# Patient Record
Sex: Female | Born: 1955 | Race: White | Hispanic: No | Smoking: Former smoker
Health system: Southern US, Community
[De-identification: ages and names within clinical notes are randomized; demographics above are authoritative.]

## PROBLEM LIST (undated history)

## (undated) DIAGNOSIS — E785 Hyperlipidemia, unspecified: Secondary | ICD-10-CM

## (undated) DIAGNOSIS — M1990 Unspecified osteoarthritis, unspecified site: Secondary | ICD-10-CM

## (undated) DIAGNOSIS — F419 Anxiety disorder, unspecified: Secondary | ICD-10-CM

## (undated) DIAGNOSIS — L659 Nonscarring hair loss, unspecified: Secondary | ICD-10-CM

## (undated) DIAGNOSIS — K52831 Collagenous colitis: Secondary | ICD-10-CM

## (undated) DIAGNOSIS — Z9889 Other specified postprocedural states: Secondary | ICD-10-CM

## (undated) DIAGNOSIS — M549 Dorsalgia, unspecified: Secondary | ICD-10-CM

## (undated) DIAGNOSIS — L719 Rosacea, unspecified: Secondary | ICD-10-CM

## (undated) DIAGNOSIS — E119 Type 2 diabetes mellitus without complications: Secondary | ICD-10-CM

## (undated) DIAGNOSIS — R51 Headache: Secondary | ICD-10-CM

## (undated) DIAGNOSIS — R519 Headache, unspecified: Secondary | ICD-10-CM

## (undated) DIAGNOSIS — L219 Seborrheic dermatitis, unspecified: Secondary | ICD-10-CM

## (undated) DIAGNOSIS — I10 Essential (primary) hypertension: Secondary | ICD-10-CM

## (undated) DIAGNOSIS — T7840XA Allergy, unspecified, initial encounter: Secondary | ICD-10-CM

## (undated) DIAGNOSIS — R928 Other abnormal and inconclusive findings on diagnostic imaging of breast: Secondary | ICD-10-CM

## (undated) DIAGNOSIS — K219 Gastro-esophageal reflux disease without esophagitis: Secondary | ICD-10-CM

## (undated) DIAGNOSIS — F329 Major depressive disorder, single episode, unspecified: Secondary | ICD-10-CM

## (undated) DIAGNOSIS — F32A Depression, unspecified: Secondary | ICD-10-CM

## (undated) DIAGNOSIS — Z973 Presence of spectacles and contact lenses: Secondary | ICD-10-CM

## (undated) DIAGNOSIS — G4730 Sleep apnea, unspecified: Secondary | ICD-10-CM

## (undated) DIAGNOSIS — G589 Mononeuropathy, unspecified: Secondary | ICD-10-CM

## (undated) DIAGNOSIS — C801 Malignant (primary) neoplasm, unspecified: Secondary | ICD-10-CM

## (undated) DIAGNOSIS — C50919 Malignant neoplasm of unspecified site of unspecified female breast: Secondary | ICD-10-CM

## (undated) DIAGNOSIS — T753XXA Motion sickness, initial encounter: Secondary | ICD-10-CM

## (undated) DIAGNOSIS — R112 Nausea with vomiting, unspecified: Secondary | ICD-10-CM

## (undated) DIAGNOSIS — Z17421 Hormone receptor negative with human epidermal growth factor receptor 2 negative status: Secondary | ICD-10-CM

## (undated) HISTORY — DX: Malignant neoplasm of unspecified site of unspecified female breast: C50.919

## (undated) HISTORY — DX: Unspecified osteoarthritis, unspecified site: M19.90

## (undated) HISTORY — DX: Seborrheic dermatitis, unspecified: L21.9

## (undated) HISTORY — PX: COLONOSCOPY: SHX174

## (undated) HISTORY — PX: TONSILLECTOMY: SUR1361

## (undated) HISTORY — DX: Mononeuropathy, unspecified: G58.9

## (undated) HISTORY — DX: Malignant (primary) neoplasm, unspecified: C80.1

## (undated) HISTORY — PX: CARPAL TUNNEL RELEASE: SHX101

## (undated) HISTORY — DX: Other abnormal and inconclusive findings on diagnostic imaging of breast: R92.8

## (undated) HISTORY — DX: Anxiety disorder, unspecified: F41.9

## (undated) HISTORY — DX: Collagenous colitis: K52.831

## (undated) HISTORY — DX: Hormone receptor negative with human epidermal growth factor receptor 2 negative status: Z17.421

## (undated) HISTORY — DX: Rosacea, unspecified: L71.9

## (undated) HISTORY — DX: Allergy, unspecified, initial encounter: T78.40XA

## (undated) HISTORY — PX: OTHER SURGICAL HISTORY: SHX169

## (undated) HISTORY — PX: MASTECTOMY: SHX3

---

## 1987-11-30 HISTORY — PX: CARPAL TUNNEL RELEASE: SHX101

## 1996-11-29 HISTORY — PX: UVULOPALATOPHARYNGOPLASTY: SHX827

## 1998-11-29 HISTORY — PX: TONSILLECTOMY: SUR1361

## 2005-11-29 HISTORY — PX: ACHILLES TENDON REPAIR: SUR1153

## 2008-07-17 ENCOUNTER — Encounter: Payer: Self-pay | Admitting: General Practice

## 2008-07-30 ENCOUNTER — Encounter: Payer: Self-pay | Admitting: General Practice

## 2008-08-29 ENCOUNTER — Encounter: Payer: Self-pay | Admitting: General Practice

## 2008-10-29 ENCOUNTER — Ambulatory Visit: Payer: Self-pay | Admitting: Gastroenterology

## 2008-11-07 ENCOUNTER — Ambulatory Visit: Payer: Self-pay | Admitting: Gastroenterology

## 2008-11-29 LAB — HM COLONOSCOPY

## 2009-02-26 ENCOUNTER — Ambulatory Visit: Payer: Self-pay

## 2009-11-29 HISTORY — PX: BREAST SURGERY: SHX581

## 2011-11-30 HISTORY — PX: FINGER EXPLORATION: SHX1635

## 2013-09-28 ENCOUNTER — Encounter (HOSPITAL_BASED_OUTPATIENT_CLINIC_OR_DEPARTMENT_OTHER): Payer: Self-pay | Admitting: *Deleted

## 2013-09-28 NOTE — Progress Notes (Signed)
Pt lives [place]-works for [facility]-had labs done 08/27/13-will call for results and last ekg-may need ekg-and istat if labs not ok with anesthesia

## 2013-10-01 ENCOUNTER — Other Ambulatory Visit: Payer: Self-pay | Admitting: Orthopedic Surgery

## 2013-10-02 NOTE — H&P (Signed)
Sierra Copeland is an 57 y.o. female.   Chief Complaint: left long finger numbness HPI: as above s/p left long trigger release now with numbness  Past Medical History  Diagnosis Date  . Diabetes mellitus without complication   . Hypertension   . Hyperlipemia   . Wears glasses   . Arthritis   . GERD (gastroesophageal reflux disease)     occ-no meds  . Depression   . PONV (postoperative nausea and vomiting)     Past Surgical History  Procedure Laterality Date  . Uvulopalatopharyngoplasty  1998    and tonsils-tongue pexi  . Breast surgery  2011    lt br bx-non cancer  . Finger exploration  2013    left long   . Carpal tunnel release      right and left  . Achilles tendon repair  2007    right  . Colonoscopy    . Tonsillectomy      History reviewed. No pertinent family history. Social History:  reports that she quit smoking about 27 years ago. She does not have any smokeless tobacco history on file. She reports that she does not drink alcohol or use illicit drugs.  Allergies: No Known Allergies  No prescriptions prior to admission    No results found for this or any previous visit (from the past 48 hour(s)). No results found.  Review of Systems  All other systems reviewed and are negative.    Height 5\' 2"  (1.575 m), weight 115.667 kg (255 lb). Physical Exam  Constitutional: She is oriented to person, place, and time. She appears well-developed and well-nourished.  HENT:  Head: Normocephalic and atraumatic.  Cardiovascular: Normal rate.   Respiratory: Effort normal.  Musculoskeletal:       Left hand: She exhibits tenderness and disruption of two-point discrimination.  S/p trigger release with numbess in left long radial digital nerve  Neurological: She is alert and oriented to person, place, and time.  Skin: Skin is warm.  Psychiatric: She has a normal mood and affect. Her behavior is normal. Judgment and thought content normal.     Assessment/Plan As  above   Plan explore and repair vs reconstruction vs neurolysis  Sierra Copeland A 10/02/2013, 6:43 PM

## 2013-10-03 ENCOUNTER — Ambulatory Visit (HOSPITAL_BASED_OUTPATIENT_CLINIC_OR_DEPARTMENT_OTHER)
Admission: RE | Admit: 2013-10-03 | Discharge: 2013-10-03 | Disposition: A | Discharge status: Home / Self Care | Payer: 59 | Source: Ambulatory Visit | Attending: Orthopedic Surgery | Admitting: Orthopedic Surgery

## 2013-10-03 ENCOUNTER — Encounter (HOSPITAL_BASED_OUTPATIENT_CLINIC_OR_DEPARTMENT_OTHER): Payer: Self-pay | Admitting: *Deleted

## 2013-10-03 ENCOUNTER — Encounter (HOSPITAL_BASED_OUTPATIENT_CLINIC_OR_DEPARTMENT_OTHER)
Admission: RE | Disposition: A | Discharge status: Home / Self Care | Payer: Self-pay | Source: Ambulatory Visit | Attending: Orthopedic Surgery

## 2013-10-03 ENCOUNTER — Encounter (HOSPITAL_BASED_OUTPATIENT_CLINIC_OR_DEPARTMENT_OTHER): Payer: 59 | Admitting: *Deleted

## 2013-10-03 ENCOUNTER — Ambulatory Visit (HOSPITAL_BASED_OUTPATIENT_CLINIC_OR_DEPARTMENT_OTHER): Payer: 59 | Admitting: *Deleted

## 2013-10-03 DIAGNOSIS — R209 Unspecified disturbances of skin sensation: Secondary | ICD-10-CM | POA: Insufficient documentation

## 2013-10-03 DIAGNOSIS — IMO0001 Reserved for inherently not codable concepts without codable children: Secondary | ICD-10-CM

## 2013-10-03 HISTORY — DX: Hyperlipidemia, unspecified: E78.5

## 2013-10-03 HISTORY — DX: Depression, unspecified: F32.A

## 2013-10-03 HISTORY — DX: Gastro-esophageal reflux disease without esophagitis: K21.9

## 2013-10-03 HISTORY — DX: Presence of spectacles and contact lenses: Z97.3

## 2013-10-03 HISTORY — DX: Essential (primary) hypertension: I10

## 2013-10-03 HISTORY — DX: Unspecified osteoarthritis, unspecified site: M19.90

## 2013-10-03 HISTORY — DX: Major depressive disorder, single episode, unspecified: F32.9

## 2013-10-03 HISTORY — DX: Nausea with vomiting, unspecified: R11.2

## 2013-10-03 HISTORY — PX: NERVE EXPLORATION: SHX2082

## 2013-10-03 HISTORY — DX: Other specified postprocedural states: Z98.890

## 2013-10-03 HISTORY — DX: Type 2 diabetes mellitus without complications: E11.9

## 2013-10-03 LAB — POCT I-STAT, CHEM 8
BUN: 19 mg/dL (ref 6–23)
Calcium, Ion: 1.12 mmol/L (ref 1.12–1.23)
Chloride: 101 mEq/L (ref 96–112)
Creatinine, Ser: 1.1 mg/dL (ref 0.50–1.10)
Glucose, Bld: 173 mg/dL — ABNORMAL HIGH (ref 70–99)
HCT: 46 % (ref 36.0–46.0)
Hemoglobin: 15.6 g/dL — ABNORMAL HIGH (ref 12.0–15.0)
Potassium: 4.1 mEq/L (ref 3.5–5.1)
Sodium: 137 mEq/L (ref 135–145)
TCO2: 25 mmol/L (ref 0–100)

## 2013-10-03 LAB — GLUCOSE, CAPILLARY: Glucose-Capillary: 140 mg/dL — ABNORMAL HIGH (ref 70–99)

## 2013-10-03 SURGERY — EXPLORATION, NERVE
Anesthesia: General | Site: Hand | Laterality: Left | Wound class: Clean

## 2013-10-03 MED ORDER — LIDOCAINE HCL (CARDIAC) 20 MG/ML IV SOLN
INTRAVENOUS | Status: DC | PRN
  Administered 2013-10-03: 60 mg via INTRAVENOUS

## 2013-10-03 MED ORDER — HEPARIN SODIUM (PORCINE) 1000 UNIT/ML IJ SOLN
INTRAMUSCULAR | Status: AC
  Filled 2013-10-03: qty 1

## 2013-10-03 MED ORDER — OXYCODONE-ACETAMINOPHEN 5-325 MG PO TABS: 1.0000 | ORAL_TABLET | ORAL | Status: DC | PRN

## 2013-10-03 MED ORDER — FENTANYL CITRATE 0.05 MG/ML IJ SOLN
INTRAMUSCULAR | Status: DC | PRN
  Administered 2013-10-03: 100 ug via INTRAVENOUS
  Administered 2013-10-03: 50 ug via INTRAVENOUS

## 2013-10-03 MED ORDER — LIDOCAINE HCL 2 % IJ SOLN
INTRAMUSCULAR | Status: AC
Start: 2013-10-03 — End: 2013-10-03
  Filled 2013-10-03: qty 20

## 2013-10-03 MED ORDER — PROPOFOL 10 MG/ML IV BOLUS
INTRAVENOUS | Status: DC | PRN
  Administered 2013-10-03: 160 mg via INTRAVENOUS

## 2013-10-03 MED ORDER — ONDANSETRON HCL 4 MG/2ML IJ SOLN
INTRAMUSCULAR | Status: DC | PRN
  Administered 2013-10-03: 4 mg via INTRAVENOUS

## 2013-10-03 MED ORDER — LIDOCAINE HCL (PF) 1 % IJ SOLN
INTRAMUSCULAR | Status: AC
  Filled 2013-10-03: qty 30

## 2013-10-03 MED ORDER — BUPIVACAINE HCL (PF) 0.25 % IJ SOLN
INTRAMUSCULAR | Status: AC
  Filled 2013-10-03: qty 30

## 2013-10-03 MED ORDER — MIDAZOLAM HCL 2 MG/2ML IJ SOLN
INTRAMUSCULAR | Status: AC
  Filled 2013-10-03: qty 2

## 2013-10-03 MED ORDER — CHLORHEXIDINE GLUCONATE 4 % EX LIQD: 60.0000 mL | Freq: Once | CUTANEOUS | Status: DC

## 2013-10-03 MED ORDER — FENTANYL CITRATE 0.05 MG/ML IJ SOLN: 50.0000 ug | INTRAMUSCULAR | Status: DC | PRN

## 2013-10-03 MED ORDER — OXYCODONE HCL 5 MG PO TABS: 5.0000 mg | ORAL_TABLET | Freq: Once | ORAL | Status: DC | PRN

## 2013-10-03 MED ORDER — LACTATED RINGERS IV SOLN
INTRAVENOUS | Status: DC
  Administered 2013-10-03: 08:00:00 via INTRAVENOUS

## 2013-10-03 MED ORDER — MIDAZOLAM HCL 5 MG/5ML IJ SOLN
INTRAMUSCULAR | Status: DC | PRN
  Administered 2013-10-03 (×2): 1 mg via INTRAVENOUS

## 2013-10-03 MED ORDER — BUPIVACAINE HCL 0.25 % IJ SOLN
INTRAMUSCULAR | Status: DC | PRN
  Administered 2013-10-03: 10 mL

## 2013-10-03 MED ORDER — SUCCINYLCHOLINE CHLORIDE 20 MG/ML IJ SOLN
INTRAMUSCULAR | Status: AC
  Filled 2013-10-03: qty 1

## 2013-10-03 MED ORDER — PROPOFOL 10 MG/ML IV EMUL
INTRAVENOUS | Status: AC
  Filled 2013-10-03: qty 100

## 2013-10-03 MED ORDER — ONDANSETRON HCL 4 MG/2ML IJ SOLN: 4.0000 mg | Freq: Four times a day (QID) | INTRAMUSCULAR | Status: DC | PRN

## 2013-10-03 MED ORDER — OXYCODONE HCL 5 MG/5ML PO SOLN: 5.0000 mg | Freq: Once | ORAL | Status: DC | PRN

## 2013-10-03 MED ORDER — FENTANYL CITRATE 0.05 MG/ML IJ SOLN: 25.0000 ug | INTRAMUSCULAR | Status: DC | PRN

## 2013-10-03 MED ORDER — FENTANYL CITRATE 0.05 MG/ML IJ SOLN
INTRAMUSCULAR | Status: AC
  Filled 2013-10-03: qty 4

## 2013-10-03 MED ORDER — CEFAZOLIN SODIUM-DEXTROSE 2-3 GM-% IV SOLR
2.0000 g | INTRAVENOUS | Status: AC
  Administered 2013-10-03: 2 g via INTRAVENOUS

## 2013-10-03 MED ORDER — MIDAZOLAM HCL 2 MG/2ML IJ SOLN: 1.0000 mg | INTRAMUSCULAR | Status: DC | PRN

## 2013-10-03 MED ORDER — CEFAZOLIN SODIUM-DEXTROSE 2-3 GM-% IV SOLR
INTRAVENOUS | Status: AC
  Filled 2013-10-03: qty 50

## 2013-10-03 SURGICAL SUPPLY — 62 items
APL SKNCLS STERI-STRIP NONHPOA (GAUZE/BANDAGES/DRESSINGS)
BAG DECANTER FOR FLEXI CONT (MISCELLANEOUS) IMPLANT
BANDAGE ELASTIC 4 VELCRO ST LF (GAUZE/BANDAGES/DRESSINGS) ×2 IMPLANT
BENZOIN TINCTURE PRP APPL 2/3 (GAUZE/BANDAGES/DRESSINGS) IMPLANT
BLADE MINI RND TIP GREEN BEAV (BLADE) IMPLANT
BLADE SURG 15 STRL LF DISP TIS (BLADE) ×1 IMPLANT
BLADE SURG 15 STRL SS (BLADE) ×2
BNDG CMPR 9X4 STRL LF SNTH (GAUZE/BANDAGES/DRESSINGS) ×1
BNDG CMPR MD 5X2 ELC HKLP STRL (GAUZE/BANDAGES/DRESSINGS) ×1
BNDG COHESIVE 1X5 TAN STRL LF (GAUZE/BANDAGES/DRESSINGS) IMPLANT
BNDG ELASTIC 2 VLCR STRL LF (GAUZE/BANDAGES/DRESSINGS) ×2 IMPLANT
BNDG ESMARK 4X9 LF (GAUZE/BANDAGES/DRESSINGS) ×2 IMPLANT
BRUSH SCRUB EZ PLAIN DRY (MISCELLANEOUS) IMPLANT
CORDS BIPOLAR (ELECTRODE) ×2 IMPLANT
COVER TABLE BACK 60X90 (DRAPES) ×2 IMPLANT
CUFF TOURNIQUET SINGLE 18IN (TOURNIQUET CUFF) IMPLANT
DECANTER SPIKE VIAL GLASS SM (MISCELLANEOUS) IMPLANT
DRAPE EXTREMITY T 121X128X90 (DRAPE) ×2 IMPLANT
DRAPE SURG 17X23 STRL (DRAPES) ×2 IMPLANT
DURAPREP 26ML APPLICATOR (WOUND CARE) ×2 IMPLANT
GAUZE SPONGE 4X4 16PLY XRAY LF (GAUZE/BANDAGES/DRESSINGS) IMPLANT
GAUZE XEROFORM 1X8 LF (GAUZE/BANDAGES/DRESSINGS) ×2 IMPLANT
GLOVE BIO SURGEON STRL SZ 6.5 (GLOVE) ×2 IMPLANT
GLOVE BIO SURGEON STRL SZ8 (GLOVE) ×2 IMPLANT
GLOVE BIOGEL PI IND STRL 7.0 (GLOVE) ×1 IMPLANT
GLOVE BIOGEL PI IND STRL 8.5 (GLOVE) ×1 IMPLANT
GLOVE BIOGEL PI INDICATOR 7.0 (GLOVE) ×1
GLOVE BIOGEL PI INDICATOR 8.5 (GLOVE) ×1
GOWN BRE IMP PREV XXLGXLNG (GOWN DISPOSABLE) ×2 IMPLANT
GOWN PREVENTION PLUS XLARGE (GOWN DISPOSABLE) ×2 IMPLANT
IV LACTATED RINGERS 500ML (IV SOLUTION) IMPLANT
NEEDLE HYPO 22GX1.5 SAFETY (NEEDLE) IMPLANT
NEEDLE HYPO 25X1 1.5 SAFETY (NEEDLE) ×2 IMPLANT
NS IRRIG 1000ML POUR BTL (IV SOLUTION) ×2 IMPLANT
PACK BASIN DAY SURGERY FS (CUSTOM PROCEDURE TRAY) ×2 IMPLANT
PAD CAST 3X4 CTTN HI CHSV (CAST SUPPLIES) ×1 IMPLANT
PADDING CAST ABS 4INX4YD NS (CAST SUPPLIES)
PADDING CAST ABS COTTON 4X4 ST (CAST SUPPLIES) IMPLANT
PADDING CAST COTTON 3X4 STRL (CAST SUPPLIES) ×2
PADDING UNDERCAST 2  STERILE (CAST SUPPLIES) ×2 IMPLANT
PASSER SUT SWANSON 36MM LOOP (INSTRUMENTS) IMPLANT
SHEET MEDIUM DRAPE 40X70 STRL (DRAPES) ×2 IMPLANT
SLEEVE SCD COMPRESS KNEE MED (MISCELLANEOUS) ×2 IMPLANT
SPEAR EYE SURG WECK-CEL (MISCELLANEOUS) ×2 IMPLANT
SPLINT PLASTER CAST XFAST 4X15 (CAST SUPPLIES) IMPLANT
SPLINT PLASTER XTRA FAST SET 4 (CAST SUPPLIES)
SPONGE GAUZE 4X4 12PLY (GAUZE/BANDAGES/DRESSINGS) ×2 IMPLANT
STOCKINETTE 4X48 STRL (DRAPES) ×2 IMPLANT
STRIP CLOSURE SKIN 1/2X4 (GAUZE/BANDAGES/DRESSINGS) IMPLANT
SUT ETHIBOND 3-0 V-5 (SUTURE) IMPLANT
SUT ETHILON 5 0 PS 2 18 (SUTURE) IMPLANT
SUT NYLON 9 0 VRM6 (SUTURE) IMPLANT
SUT PROLENE 3 0 PS 2 (SUTURE) IMPLANT
SUT PROLENE 6 0 P 1 18 (SUTURE) IMPLANT
SUT SILK 4 0 PS 2 (SUTURE) IMPLANT
SUT VICRYL RAPIDE 4/0 PS 2 (SUTURE) ×2 IMPLANT
SYR BULB 3OZ (MISCELLANEOUS) ×2 IMPLANT
SYR CONTROL 10ML LL (SYRINGE) ×2 IMPLANT
TOWEL OR 17X24 6PK STRL BLUE (TOWEL DISPOSABLE) ×4 IMPLANT
TRAY DSU PREP LF (CUSTOM PROCEDURE TRAY) IMPLANT
TUBE FEEDING 5FR 15 INCH (TUBING) IMPLANT
UNDERPAD 30X30 INCONTINENT (UNDERPADS AND DIAPERS) ×2 IMPLANT

## 2013-10-03 NOTE — Op Note (Signed)
See note [id number]

## 2013-10-03 NOTE — Interval H&P Note (Signed)
History and Physical Interval Note:  10/03/2013 8:36 AM  Sierra Copeland  has presented today for surgery, with the diagnosis of LEFT LONG FRADIAL DISITAL NERVE COMPRESSION  The various methods of treatment have been discussed with the patient and family. After consideration of risks, benefits and other options for treatment, the patient has consented to  Procedure(s): NERVE EXPLORATION/REPAIR/ LEFT LONG RADIAL DISTAL NERVE (Left) as a surgical intervention .  The patient's history has been reviewed, patient examined, no change in status, stable for surgery.  I have reviewed the patient's chart and labs.  Questions were answered to the patient's satisfaction.     Dairl Ponder A

## 2013-10-03 NOTE — Anesthesia Procedure Notes (Signed)
Procedure Name: LMA Insertion Date/Time: 10/03/2013 8:44 AM Performed by: Dairl Ponder A Pre-anesthesia Checklist: Patient identified, Emergency Drugs available, Suction available and Patient being monitored Patient Re-evaluated:Patient Re-evaluated prior to inductionOxygen Delivery Method: Circle System Utilized Preoxygenation: Pre-oxygenation with 100% oxygen Intubation Type: IV induction Ventilation: Mask ventilation without difficulty LMA: LMA inserted LMA Size: 4.0 Number of attempts: 1 Airway Equipment and Method: bite block Placement Confirmation: positive ETCO2 and breath sounds checked- equal and bilateral Tube secured with: Tape Dental Injury: Teeth and Oropharynx as per pre-operative assessment

## 2013-10-03 NOTE — Transfer of Care (Signed)
Immediate Anesthesia Transfer of Care Note  Patient: Sierra Copeland  Procedure(s) Performed: Procedure(s): NERVE EXPLORATION/ LEFT LONG RADIAL DISTAL NERVE neurolysis (Left)  Patient Location: PACU  Anesthesia Type:General  Level of Consciousness: awake, alert  and oriented  Airway & Oxygen Therapy: Patient Spontanous Breathing and Patient connected to face mask oxygen  Post-op Assessment: Report given to PACU RN, Post -op Vital signs reviewed and stable and Patient moving all extremities  Post vital signs: Reviewed and stable  Complications: No apparent anesthesia complications

## 2013-10-03 NOTE — Anesthesia Preprocedure Evaluation (Signed)
Anesthesia Evaluation  Patient identified by MRN, date of birth, ID band Patient awake    Reviewed: Allergy & Precautions, H&P , NPO status , Patient's Chart, lab work & pertinent test results  History of Anesthesia Complications (+) PONV  Airway Mallampati: III  Neck ROM: full    Dental   Pulmonary former smoker,          Cardiovascular hypertension,     Neuro/Psych Depression    GI/Hepatic GERD-  ,  Endo/Other  diabetes, Type obesity  Renal/GU      Musculoskeletal   Abdominal   Peds  Hematology   Anesthesia Other Findings   Reproductive/Obstetrics                           Anesthesia Physical Anesthesia Plan  ASA: III  Anesthesia Plan: General   Post-op Pain Management:    Induction: Intravenous  Airway Management Planned: LMA  Additional Equipment:   Intra-op Plan:   Post-operative Plan:   Informed Consent: I have reviewed the patients History and Physical, chart, labs and discussed the procedure including the risks, benefits and alternatives for the proposed anesthesia with the patient or authorized representative who has indicated his/her understanding and acceptance.     Plan Discussed with: CRNA, Anesthesiologist and Surgeon  Anesthesia Plan Comments:         Anesthesia Quick Evaluation

## 2013-10-04 NOTE — Op Note (Signed)
[name], [name]                  ACCOUNT NO.:  [id number]  MEDICAL RECORD NO.:  [id number]  LOCATION:                               FACILITY:  [facility]  PHYSICIAN:  Artist Pais. Aeisha Minarik, M.D.DATE OF BIRTH:  Apr 05, 1956  DATE OF PROCEDURE:  10/03/2013 DATE OF DISCHARGE:  10/03/2013                              OPERATIVE REPORT   PREOPERATIVE DIAGNOSIS:  Left long finger radial-sided numbness and tingling status post A1 pulley release several months ago.  POSTOPERATIVE DIAGNOSIS:  Left long finger radial-sided numbness and tingling status post A1 pulley release several months ago.  PROCEDURE:  Exploration with microscopic neurolysis of common digital nerve to the long and index and digital nerve radial side to the long.  SURGEON:  Artist Pais. Mina Marble, MD  ASSISTANT:  None.  ANESTHESIA:  General.  COMPLICATIONS:  none.  DRAINS:  None.  DESCRIPTION OF PROCEDURE:  The patient was taken to the operating suite. After induction of adequate general anesthesia, the left upper extremity was prepped and draped in usual sterile fashion.  Esmarch was used to exsanguinate the limb.  Tourniquet was inflated to 250 mmHg.  At this point in time, we incorporated our old A1 pulley incision and made a Brunner incision over the A1 pulley area starting at the MP flexion crease on the ulnar side going to the radial side back to the ulnar and then to radial.  Flaps were raised.  Dissection was carried down to the flexor sheath of the long finger and to the radial neurovascular bundle. We identified the common digital nerve to the ulnar side of the index finger and the radial side of the long finger.  At the bifurcation, we traced this down to the level of the metacarpophalangeal joint on the radial side.  The digital nerve had significant scarring at the level of the A1 pulley.  We did loupe magnification to identify and do an initial external neurolysis.  We then brought the microscope on the field  and under microscopic magnification did an epineurolysis of the scar around the nerve, radial digital nerve to the long finger.  Hemostasis was achieved with bipolar cautery.  The wound was then irrigated and loosely closed with 4- 0 nylon.  Xeroform, 4x4s, fluffs, compressive dressing was applied.  The patient tolerated the procedure well and went to the recovery room in stable fashion.     Artist Pais Mina Marble, M.D.     MAW/MEDQ  D:  10/03/2013  T:  10/04/2013  Job:  [id number]

## 2013-10-05 ENCOUNTER — Encounter (HOSPITAL_BASED_OUTPATIENT_CLINIC_OR_DEPARTMENT_OTHER): Payer: Self-pay | Admitting: Orthopedic Surgery

## 2013-10-07 NOTE — Anesthesia Postprocedure Evaluation (Signed)
Anesthesia Post Note  Patient: Sierra Copeland  Procedure(s) Performed: Procedure(s) (LRB): NERVE EXPLORATION/ LEFT LONG RADIAL DISTAL NERVE neurolysis (Left)  Anesthesia type: General  Patient location: PACU  Post pain: Pain level controlled and Adequate analgesia  Post assessment: Post-op Vital signs reviewed, Patient's Cardiovascular Status Stable, Respiratory Function Stable, Patent Airway and Pain level controlled  Last Vitals:  Filed Vitals:   10/03/13 1010  BP: 117/66  Pulse: 85  Temp: 36.6 C  Resp: 16    Post vital signs: Reviewed and stable  Level of consciousness: awake, alert  and oriented  Complications: No apparent anesthesia complications

## 2014-02-01 ENCOUNTER — Ambulatory Visit: Payer: Self-pay | Admitting: Gastroenterology

## 2014-02-04 LAB — PATHOLOGY REPORT

## 2015-05-01 ENCOUNTER — Other Ambulatory Visit: Payer: Self-pay

## 2015-05-01 ENCOUNTER — Telehealth: Payer: Self-pay

## 2015-05-01 MED ORDER — TRAMADOL HCL 50 MG PO TABS: 50.0000 mg | ORAL_TABLET | Freq: Four times a day (QID) | ORAL | Status: DC | PRN

## 2015-05-01 NOTE — Telephone Encounter (Signed)
-----   Message from Arnetha Courser, MD sent at 05/01/2015 12:24 PM EDT ----- Regarding: Prescription to [facility] for controlled substance I tried to print this on regular paper to fax, but it came out on expensive paper. She can either pick the Rx up or you can call it in to [facility] (not both, obviously). :-) Rx is on your desk.

## 2015-05-01 NOTE — Telephone Encounter (Signed)
rx called into pharm.

## 2015-05-01 NOTE — Telephone Encounter (Signed)
She needs a refill on Tramadol

## 2015-05-01 NOTE — Telephone Encounter (Signed)
[facility] reviewed; fills appropriate Last visit March 17, 2015 approved

## 2015-05-06 ENCOUNTER — Other Ambulatory Visit: Payer: Self-pay

## 2015-05-06 NOTE — Telephone Encounter (Signed)
You had refilled this in [employer] but [facility] states they did not get it. Can you try rewriting and sending it thru here?

## 2015-05-07 MED ORDER — INSULIN DETEMIR 100 UNIT/ML ~~LOC~~ SOLN: 45.0000 [IU] | Freq: Two times a day (BID) | SUBCUTANEOUS | Status: DC

## 2015-05-31 ENCOUNTER — Other Ambulatory Visit: Payer: Self-pay | Admitting: Family Medicine

## 2015-06-06 LAB — HM DIABETES EYE EXAM

## 2015-06-09 ENCOUNTER — Ambulatory Visit: Payer: Self-pay | Admitting: Family Medicine

## 2015-06-16 ENCOUNTER — Ambulatory Visit (INDEPENDENT_AMBULATORY_CARE_PROVIDER_SITE_OTHER): Payer: 59 | Admitting: Family Medicine

## 2015-06-16 ENCOUNTER — Encounter: Payer: Self-pay | Admitting: Family Medicine

## 2015-06-16 VITALS — BP 149/92 | HR 91 | Temp 97.6°F | Wt 247.0 lb

## 2015-06-16 DIAGNOSIS — I10 Essential (primary) hypertension: Secondary | ICD-10-CM | POA: Diagnosis not present

## 2015-06-16 DIAGNOSIS — E7800 Pure hypercholesterolemia, unspecified: Secondary | ICD-10-CM | POA: Insufficient documentation

## 2015-06-16 DIAGNOSIS — Z5181 Encounter for therapeutic drug level monitoring: Secondary | ICD-10-CM | POA: Diagnosis not present

## 2015-06-16 DIAGNOSIS — E1129 Type 2 diabetes mellitus with other diabetic kidney complication: Secondary | ICD-10-CM | POA: Insufficient documentation

## 2015-06-16 DIAGNOSIS — E1165 Type 2 diabetes mellitus with hyperglycemia: Secondary | ICD-10-CM | POA: Diagnosis not present

## 2015-06-16 DIAGNOSIS — I129 Hypertensive chronic kidney disease with stage 1 through stage 4 chronic kidney disease, or unspecified chronic kidney disease: Secondary | ICD-10-CM | POA: Insufficient documentation

## 2015-06-16 DIAGNOSIS — IMO0002 Reserved for concepts with insufficient information to code with codable children: Secondary | ICD-10-CM | POA: Insufficient documentation

## 2015-06-16 MED ORDER — PAROXETINE HCL 10 MG PO TABS: 10.0000 mg | ORAL_TABLET | Freq: Every day | ORAL | Status: DC

## 2015-06-16 MED ORDER — SIMVASTATIN 40 MG PO TABS: 40.0000 mg | ORAL_TABLET | Freq: Every day | ORAL | Status: DC

## 2015-06-16 MED ORDER — LISINOPRIL 40 MG PO TABS: 40.0000 mg | ORAL_TABLET | Freq: Every day | ORAL | Status: DC

## 2015-06-16 NOTE — Assessment & Plan Note (Signed)
Limit saturated fats; continue statin 

## 2015-06-16 NOTE — Assessment & Plan Note (Signed)
Foot exam by MD; check feet every night; check A1c today; check FSBS 3x a day; eye exam yearly

## 2015-06-16 NOTE — Progress Notes (Signed)
BP 149/92 mmHg  Pulse 91  Temp(Src) 97.6 F (36.4 C)  Wt 247 lb (112.038 kg)  SpO2 97%   Subjective:    Patient ID: Sierra Copeland, female    DOB: 01/30/1956, 59 y.o.   MRN: [id number]  HPI: Sierra Copeland is a 59 y.o. female  Chief Complaint  Patient presents with  . Diabetes  . Hyperlipidemia  . Hypertension   Hypertension; checks BP away from her; can't get top below 130; goes up to 140 range; today is work and stress; bottom at home is fine; the HCTZ medicine was stopped  Diabetes; running in the 200 range; running high; she is only on two insulins, not three per the check in meds; checking sugars once or twice a day; rare low sugars, none in the last 3 months  Obesity; trying to eat better; she does not get exercise; says her knees are keeping her from exercising; has not thought about pool therapy  She has a pinched nerve running down the left leg; seeing chiropractor and goes to [facility] physiatrist at [facility] on July 26th; burning; no loss of bowel or bladder; has hx of disc issues and back problems  Relevant past medical, surgical, family and social history reviewed and updated as indicated. Interim medical history since our last visit reviewed. Allergies and medications reviewed and updated.  Review of Systems  Gastrointestinal:       Colitis acted up last week   Per HPI unless specifically indicated above     Objective:    BP 149/92 mmHg  Pulse 91  Temp(Src) 97.6 F (36.4 C)  Wt 247 lb (112.038 kg)  SpO2 97%  Wt Readings from Last 3 Encounters:  06/16/15 247 lb (112.038 kg)  03/17/15 241 lb (109.317 kg)  10/03/13 253 lb (114.76 kg)    Physical Exam  Constitutional: She appears well-developed and well-nourished. No distress.  HENT:  Head: Normocephalic and atraumatic.  Eyes: EOM are normal. No scleral icterus.  Neck: No thyromegaly present.  Cardiovascular: Normal rate, regular rhythm and normal heart sounds.   No murmur  heard. Pulmonary/Chest: Effort normal and breath sounds normal. No respiratory distress. She has no wheezes.  Abdominal: Soft. Bowel sounds are normal. She exhibits no distension.  Musculoskeletal: Normal range of motion. She exhibits no edema.  Neurological: She is alert. She exhibits normal muscle tone.  Skin: Skin is warm and dry. She is not diaphoretic. No pallor.  Psychiatric: She has a normal mood and affect. Her behavior is normal. Judgment and thought content normal.   Diabetic Foot Form - Detailed   Diabetic Foot Exam - detailed  Diabetic Foot exam was performed with the following findings:  Yes 06/16/2015 11:24 AM  Visual Foot Exam completed.:  Yes  Is there a history of foot ulcer?:  No  Can the patient see the bottom of their feet?:  Yes  Are the shoes appropriate in style and fit?:  No  Are the toenails long?:  No  Are the toenails thick?:  No  Are the toenails ingrown?:  No    Pulse Foot Exam completed.:  Yes  Right Dorsalis Pedis:  Present Left Dorsalis Pedis:  Present  Sensory Foot Exam Completed.:  Yes  Semmes-Weinstein Monofilament Test  R Site 1-Great Toe:  Pos L Site 1-Great Toe:  Pos  R Site 4:  Pos L Site 4:  Pos    Comments:  Vibration and monofilament testing intact bilaterally  Results for orders placed or performed in visit on 05/01/15  HM COLONOSCOPY  Result Value Ref Range   HM Colonoscopy precancerous polyp       Assessment & Plan:   Problem List Items Addressed This Visit      Cardiovascular and Mediastinum   Essential hypertension, benign - Primary    Increase ACE-I; return in 4 weeks; limit salt; follow DASH guidelines      Relevant Medications   lisinopril (PRINIVIL,ZESTRIL) 40 MG tablet   simvastatin (ZOCOR) 40 MG tablet     Other   Type II diabetes mellitus, uncontrolled    Foot exam by MD; check feet every night; check A1c today; check FSBS 3x a day; eye exam yearly      Relevant Medications   lisinopril  (PRINIVIL,ZESTRIL) 40 MG tablet   simvastatin (ZOCOR) 40 MG tablet   Other Relevant Orders   Hgb A1c w/o eAG   Microalbumin / creatinine urine ratio   Hypercholesteremia    Limit saturated fats; continue statin      Relevant Medications   lisinopril (PRINIVIL,ZESTRIL) 40 MG tablet   simvastatin (ZOCOR) 40 MG tablet   Other Relevant Orders   Lipid Panel w/o Chol/HDL Ratio   Morbid obesity    Limit stress, breathing exercises, consider pool exercise; try to drink 64 ounces of water a day       Other Visit Diagnoses    Medication monitoring encounter        Relevant Orders    Comprehensive metabolic panel       Follow up plan: Return in about 4 weeks (around 07/14/2015) for for  blood pressure and BMP, and also physical when due.  Medications Discontinued During This Encounter  Medication Reason  . lisinopril-hydrochlorothiazide (PRINZIDE,ZESTORETIC) 20-12.5 MG per tablet Change in therapy  . oxyCODONE-acetaminophen (ROXICET) 5-325 MG per tablet Discontinued by provider  . diazepam (VALIUM) 5 MG tablet Discontinued by provider  . FLUoxetine (PROZAC) 10 MG capsule Discontinued by provider  . PARoxetine (PAXIL-CR) 37.5 MG 24 hr tablet Entry Error  . meloxicam (MOBIC) 7.5 MG tablet Side effect (s)  . lisinopril (PRINIVIL,ZESTRIL) 30 MG tablet Dose change  . insulin regular (NOVOLIN R,HUMULIN R) 100 units/mL injection Expired Prescription  . l-methylfolate-B6-B12 (METANX) 3-35-2 MG TABS Completed Course  . simvastatin (ZOCOR) 40 MG tablet Reorder

## 2015-06-16 NOTE — Assessment & Plan Note (Signed)
Limit stress, breathing exercises, consider pool exercise; try to drink 64 ounces of water a day

## 2015-06-16 NOTE — Assessment & Plan Note (Signed)
Increase ACE-I; return in 4 weeks; limit salt; follow DASH guidelines

## 2015-06-16 NOTE — Patient Instructions (Addendum)
Increase the lisinopril to 40 mg daily Return in 4 weeks for recheck Avoid salt substitutes and fruit smoothies Try to follow DASH guidelines Check fingerstick blood sugars 3x a day Check out the information at familydoctor.org entitled "What It Takes to Lose Weight" Try to lose between 1/2 to 1 pound per week by taking in fewer calories and burning off more calories You can succeed by limiting portions, limiting foods dense in calories and fat, becoming more active, and drinking 8 glasses of water a day Don't skip meals, especially breakfast, as skipping meals may alter your metabolism Do not use over-the-counter weight loss pills or gimmicks that claim rapid weight loss A healthy BMI (or body mass index) is between 18.5 and 24.9 You can calculate your ideal BMI at the [employer] website ClubMonetize.fr Your goal blood pressure is less than 130 mmHg on top Try to follow the DASH guidelines (DASH stands for Dietary Approaches to Stop Hypertension) Try to limit the sodium in your diet.  Ideally, consume less than 1.5 grams (less than 1,500mg ) per day. Do not add salt when cooking or at the table.  Check the sodium amount on labels when shopping, and choose items lower in sodium when given a choice. Avoid or limit foods that already contain a lot of sodium. Eat a diet rich in fruits and vegetables and whole grains.

## 2015-06-17 ENCOUNTER — Telehealth: Payer: Self-pay | Admitting: Family Medicine

## 2015-06-17 LAB — COMPREHENSIVE METABOLIC PANEL
A/G RATIO: 2 (ref 1.1–2.5)
ALBUMIN: 4.4 g/dL (ref 3.5–5.5)
ALK PHOS: 37 IU/L — AB (ref 39–117)
ALT: 34 IU/L — ABNORMAL HIGH (ref 0–32)
AST: 22 IU/L (ref 0–40)
BILIRUBIN TOTAL: 0.4 mg/dL (ref 0.0–1.2)
BUN/Creatinine Ratio: 15 (ref 9–23)
BUN: 15 mg/dL (ref 6–24)
CALCIUM: 9.5 mg/dL (ref 8.7–10.2)
CO2: 21 mmol/L (ref 18–29)
Chloride: 96 mmol/L — ABNORMAL LOW (ref 97–108)
Creatinine, Ser: 0.97 mg/dL (ref 0.57–1.00)
GFR calc non Af Amer: 65 mL/min/{1.73_m2} (ref >59)
GFR, EST AFRICAN AMERICAN: 75 mL/min/{1.73_m2} (ref >59)
GLOBULIN, TOTAL: 2.2 g/dL (ref 1.5–4.5)
GLUCOSE: 179 mg/dL — AB (ref 65–99)
Potassium: 4.9 mmol/L (ref 3.5–5.2)
Sodium: 140 mmol/L (ref 134–144)
Total Protein: 6.6 g/dL (ref 6.0–8.5)

## 2015-06-17 LAB — LIPID PANEL W/O CHOL/HDL RATIO
CHOLESTEROL TOTAL: 159 mg/dL (ref 100–199)
HDL: 59 mg/dL (ref >39)
LDL CALC: 70 mg/dL (ref 0–99)
TRIGLYCERIDES: 151 mg/dL — AB (ref 0–149)
VLDL Cholesterol Cal: 30 mg/dL (ref 5–40)

## 2015-06-17 LAB — MICROALBUMIN / CREATININE URINE RATIO
Creatinine, Urine: 60.1 mg/dL
MICROALB/CREAT RATIO: 39.8 mg/g creat — ABNORMAL HIGH (ref 0.0–30.0)
Microalbumin, Urine: 23.9 ug/mL

## 2015-06-17 LAB — HGB A1C W/O EAG: Hgb A1c MFr Bld: 8.8 % — ABNORMAL HIGH (ref 4.8–5.6)

## 2015-06-17 NOTE — Telephone Encounter (Signed)
Please let patient know that Sierra Copeland A1C is high, 8.8 To see which insulin I need to adjust, please ask Sierra Copeland to do some pre- and post-prandial blood sugar readings and then bring those in next week I'll ask Sierra Copeland to check Sierra Copeland sugars before two of Sierra Copeland meals each day and then check 90-120 minutes after the START of the meal, so that will be four checks per day total Just do that Wed, Thurs, Friday, Saturday, and Sunday, then bring that by so I can review it Also, have Sierra Copeland write down on that paper exactly how much of each type of insulin she is giving herself and WHEN  That will really help; thanks

## 2015-06-17 NOTE — Telephone Encounter (Signed)
Left message to call.

## 2015-06-18 NOTE — Telephone Encounter (Signed)
Patient notified

## 2015-06-18 NOTE — Telephone Encounter (Signed)
Left message to call at work #

## 2015-06-19 ENCOUNTER — Other Ambulatory Visit: Payer: Self-pay | Admitting: Family Medicine

## 2015-06-19 NOTE — Telephone Encounter (Signed)
Routing to provider. Dr. Sanda Klein, can you resend her paroxetine and simvastatin to [facility]? I fixed the pharm.

## 2015-06-19 NOTE — Telephone Encounter (Signed)
rx needs to go to [facility] instead of [facility]. (simistatin and peroxiteen)

## 2015-06-20 MED ORDER — SIMVASTATIN 40 MG PO TABS: 40.0000 mg | ORAL_TABLET | Freq: Every day | ORAL | Status: DC

## 2015-06-20 MED ORDER — PAROXETINE HCL 10 MG PO TABS: 10.0000 mg | ORAL_TABLET | Freq: Every day | ORAL | Status: DC

## 2015-06-24 ENCOUNTER — Other Ambulatory Visit: Payer: Self-pay | Admitting: Physical Medicine and Rehabilitation

## 2015-06-24 DIAGNOSIS — M5417 Radiculopathy, lumbosacral region: Secondary | ICD-10-CM

## 2015-06-27 ENCOUNTER — Telehealth: Payer: Self-pay

## 2015-06-27 ENCOUNTER — Ambulatory Visit
Admission: RE | Admit: 2015-06-27 | Discharge: 2015-06-27 | Disposition: A | Discharge status: Home / Self Care | Payer: 59 | Source: Ambulatory Visit | Attending: Physical Medicine and Rehabilitation | Admitting: Physical Medicine and Rehabilitation

## 2015-06-27 DIAGNOSIS — M5126 Other intervertebral disc displacement, lumbar region: Secondary | ICD-10-CM | POA: Diagnosis not present

## 2015-06-27 DIAGNOSIS — E1165 Type 2 diabetes mellitus with hyperglycemia: Secondary | ICD-10-CM

## 2015-06-27 DIAGNOSIS — IMO0002 Reserved for concepts with insufficient information to code with codable children: Secondary | ICD-10-CM

## 2015-06-27 DIAGNOSIS — M5416 Radiculopathy, lumbar region: Secondary | ICD-10-CM | POA: Insufficient documentation

## 2015-06-27 DIAGNOSIS — M5417 Radiculopathy, lumbosacral region: Secondary | ICD-10-CM

## 2015-06-27 NOTE — Telephone Encounter (Signed)
She dropped off her glucose readings. They are in your blue box. Also she wants to know when she can get a refill on her Tramadol.

## 2015-06-27 NOTE — Telephone Encounter (Signed)
She can get her tramadol August 2nd I'll review her glucose readings

## 2015-06-28 MED ORDER — TRAMADOL HCL 50 MG PO TABS: 50.0000 mg | ORAL_TABLET | Freq: Four times a day (QID) | ORAL | Status: DC | PRN

## 2015-06-28 NOTE — Telephone Encounter (Signed)
I called her tramadol in to [facility] personally on Saturday afternoon around 4:45 pm Sierra Copeland--> please let her know I reviewed her glucose readings Two readings in particular show one big jump up and one big drop down after 20 and 22 units respectively I am going to suggest that she see an endocrinologist at this point for her diabetes

## 2015-06-28 NOTE — Assessment & Plan Note (Signed)
Refer to endo; high doses of insulin

## 2015-06-30 NOTE — Telephone Encounter (Signed)
Left message to call.

## 2015-06-30 NOTE — Telephone Encounter (Signed)
Patient notified

## 2015-07-10 ENCOUNTER — Ambulatory Visit (INDEPENDENT_AMBULATORY_CARE_PROVIDER_SITE_OTHER): Payer: 59 | Admitting: Family Medicine

## 2015-07-10 ENCOUNTER — Encounter: Payer: Self-pay | Admitting: Family Medicine

## 2015-07-10 VITALS — BP 138/84 | HR 88 | Temp 97.7°F | Wt 248.0 lb

## 2015-07-10 DIAGNOSIS — Z5181 Encounter for therapeutic drug level monitoring: Secondary | ICD-10-CM

## 2015-07-10 DIAGNOSIS — I10 Essential (primary) hypertension: Secondary | ICD-10-CM | POA: Diagnosis not present

## 2015-07-10 DIAGNOSIS — K5289 Other specified noninfective gastroenteritis and colitis: Secondary | ICD-10-CM

## 2015-07-10 DIAGNOSIS — K52831 Collagenous colitis: Secondary | ICD-10-CM | POA: Insufficient documentation

## 2015-07-10 NOTE — Progress Notes (Signed)
BP 138/84 mmHg  Pulse 88  Temp(Src) 97.7 F (36.5 C)  Wt 248 lb (112.492 kg)  SpO2 97%   Subjective:    Patient ID: Sierra Copeland, female    DOB: 02/01/1956, 59 y.o.   MRN: [id number]  HPI: Sierra Copeland is a 59 y.o. female  Chief Complaint  Patient presents with  . Other    needs FMLA forms filled out   She has had her BP medicine recently increased; checking at home, running 130s, rarely 140 and over the 80s; no side effects; increase was about 3 weeks ago; not drinking fruit smoothies  She needs paperwork for FMLA for colitis; last year's papers reviewed, new ones signed  She has diabetes and obesity; used to see Dr. Gabriel Carina and is going back to see her next week  She is not exercising regularly; cannot because of her knees; also, has the recurrent diarrhea which limits pool; she tries to eat right; meloxicam might have caused issues so she stopped it  Relevant past medical, surgical, family and social history reviewed and updated as indicated. Interim medical history since our last visit reviewed. Allergies and medications reviewed and updated.  Review of Systems  Per HPI unless specifically indicated above    Medication List       This list is accurate as of: 07/10/15 11:27 AM.  Always use your most recent med list.               aspirin EC 81 MG tablet  Take 81 mg by mouth daily.     b complex vitamins tablet  Take 1 tablet by mouth daily.     fexofenadine 180 MG tablet  Commonly known as:  ALLEGRA  Take 180 mg by mouth daily.     fluticasone 50 MCG/ACT nasal spray  Commonly known as:  FLONASE  Place into both nostrils daily.     gabapentin 100 MG capsule  Commonly known as:  NEURONTIN  Take 100 mg by mouth 2 (two) times daily.     glucose blood test strip  1 each by Other route as needed for other. One Touch Ultra. Test FSBS 3x a day. Dx:E11.65, LON 99 months: disp 300 with 1 refill.     insulin detemir 100 UNIT/ML injection  Commonly  known as:  LEVEMIR  Inject 0.45 mLs (45 Units total) into the skin 2 (two) times daily.     insulin lispro 100 UNIT/ML injection  Commonly known as:  HUMALOG  Inject into the skin 3 (three) times daily before meals. Inject 10-20 units three times each day before meals subcutaneously; 5 vials=3 month supply.     Insulin Syringe-Needle U-100 30G X 5/16" 0.3 ML Misc     lisinopril 40 MG tablet  Commonly known as:  PRINIVIL,ZESTRIL  Take 1 tablet (40 mg total) by mouth daily. Avoid salt substitutes and fruit smoothies     metFORMIN 1000 MG tablet  Commonly known as:  GLUCOPHAGE  Take 1,000 mg by mouth 2 (two) times daily with a meal.     PARoxetine 10 MG tablet  Commonly known as:  PAXIL  Take 1 tablet (10 mg total) by mouth daily.     simvastatin 40 MG tablet  Commonly known as:  ZOCOR  Take 1 tablet (40 mg total) by mouth at bedtime.     tiZANidine 2 MG tablet  Commonly known as:  ZANAFLEX  Take 2 mg by mouth 3 (three) times daily as needed.  traMADol 50 MG tablet  Commonly known as:  ULTRAM  Take 1 tablet (50 mg total) by mouth every 6 (six) hours as needed.           Objective:    BP 138/84 mmHg  Pulse 88  Temp(Src) 97.7 F (36.5 C)  Wt 248 lb (112.492 kg)  SpO2 97%  Wt Readings from Last 3 Encounters:  07/10/15 248 lb (112.492 kg)  06/16/15 247 lb (112.038 kg)  03/17/15 241 lb (109.317 kg)    Physical Exam  Constitutional: She appears well-developed and well-nourished. No distress.  Morbidly obese with truncal obesity  Eyes: EOM are normal. No scleral icterus.  Neck: No thyromegaly present.  Cardiovascular: Normal rate.   Pulmonary/Chest: Effort normal.  Abdominal: She exhibits no distension.  Skin: No pallor.  Psychiatric: She has a normal mood and affect. Her behavior is normal. Judgment and thought content normal.   Results for orders placed or performed in visit on 06/16/15  Hgb A1c w/o eAG  Result Value Ref Range   Hgb A1c MFr Bld 8.8 (H) 4.8 -  5.6 %  Comprehensive metabolic panel  Result Value Ref Range   Glucose 179 (H) 65 - 99 mg/dL   BUN 15 6 - 24 mg/dL   Creatinine, Ser 0.97 0.57 - 1.00 mg/dL   GFR calc non Af Amer 65 >59 mL/min/1.73   GFR calc Af Amer 75 >59 mL/min/1.73   BUN/Creatinine Ratio 15 9 - 23   Sodium 140 134 - 144 mmol/L   Potassium 4.9 3.5 - 5.2 mmol/L   Chloride 96 (L) 97 - 108 mmol/L   CO2 21 18 - 29 mmol/L   Calcium 9.5 8.7 - 10.2 mg/dL   Total Protein 6.6 6.0 - 8.5 g/dL   Albumin 4.4 3.5 - 5.5 g/dL   Globulin, Total 2.2 1.5 - 4.5 g/dL   Albumin/Globulin Ratio 2.0 1.1 - 2.5   Bilirubin Total 0.4 0.0 - 1.2 mg/dL   Alkaline Phosphatase 37 (L) 39 - 117 IU/L   AST 22 0 - 40 IU/L   ALT 34 (H) 0 - 32 IU/L  Lipid Panel w/o Chol/HDL Ratio  Result Value Ref Range   Cholesterol, Total 159 100 - 199 mg/dL   Triglycerides 151 (H) 0 - 149 mg/dL   HDL 59 >39 mg/dL   VLDL Cholesterol Cal 30 5 - 40 mg/dL   LDL Calculated 70 0 - 99 mg/dL  Microalbumin / creatinine urine ratio  Result Value Ref Range   Creatinine, Urine 60.1 Not Estab. mg/dL   Microalbum.,U,Random 23.9 Not Estab. ug/mL   MICROALB/CREAT RATIO 39.8 (H) 0.0 - 30.0 mg/g creat      Assessment & Plan:   Problem List Items Addressed This Visit      Cardiovascular and Mediastinum   Essential hypertension, benign    Improved; work on weight loss; check BMP today        Digestive   Collagenous colitis - Primary    Paperwork filled out today for FMLA        Other   Morbid obesity    Encouragement given; drink 64 ounces of water daily; consider activity like swimming when stools are controlled      Medication monitoring encounter    Check BMP on the higher dose of ACE-I      Relevant Orders   Basic metabolic panel      Follow up plan: No Follow-up on file.  Return for next regular appt in Sept  An after-visit summary was printed and given to the patient at [facility].  Please see the patient instructions which may contain other  information and recommendations beyond what is mentioned above in the assessment and plan.

## 2015-07-10 NOTE — Assessment & Plan Note (Signed)
Improved; work on weight loss; check BMP today

## 2015-07-10 NOTE — Assessment & Plan Note (Signed)
Check BMP on the higher dose of ACE-I

## 2015-07-10 NOTE — Assessment & Plan Note (Signed)
Paperwork filled out today for [employer]

## 2015-07-10 NOTE — Assessment & Plan Note (Signed)
Encouragement given; drink 64 ounces of water daily; consider activity like swimming when stools are controlled

## 2015-07-10 NOTE — Patient Instructions (Addendum)
Talk with Dr. Gabriel Carina about whether or not another injectable medicine would help control your sugars and help you lose weight We'll contact you about the labs drawn today Cancel the appt here for later this month and we'll see you in September

## 2015-07-11 ENCOUNTER — Encounter: Payer: Self-pay | Admitting: Family Medicine

## 2015-07-11 LAB — BASIC METABOLIC PANEL
BUN/Creatinine Ratio: 19 (ref 9–23)
BUN: 17 mg/dL (ref 6–24)
CALCIUM: 9.7 mg/dL (ref 8.7–10.2)
CO2: 26 mmol/L (ref 18–29)
Chloride: 96 mmol/L — ABNORMAL LOW (ref 97–108)
Creatinine, Ser: 0.9 mg/dL (ref 0.57–1.00)
GFR calc Af Amer: 82 mL/min/{1.73_m2} (ref >59)
GFR calc non Af Amer: 71 mL/min/{1.73_m2} (ref >59)
GLUCOSE: 185 mg/dL — AB (ref 65–99)
Potassium: 5.1 mmol/L (ref 3.5–5.2)
SODIUM: 138 mmol/L (ref 134–144)

## 2015-07-15 ENCOUNTER — Other Ambulatory Visit: Payer: Self-pay | Admitting: Family Medicine

## 2015-07-16 NOTE — Telephone Encounter (Signed)
Routing to provider  

## 2015-07-17 ENCOUNTER — Ambulatory Visit: Payer: 59 | Admitting: Family Medicine

## 2015-07-30 ENCOUNTER — Other Ambulatory Visit: Payer: Self-pay | Admitting: Family Medicine

## 2015-07-30 MED ORDER — TRAMADOL HCL 50 MG PO TABS: 50.0000 mg | ORAL_TABLET | Freq: Four times a day (QID) | ORAL | Status: DC | PRN

## 2015-07-30 NOTE — Telephone Encounter (Signed)
Pt called stated she needs refill on Tramadol. Pharm is [facility]. Thanks.

## 2015-08-01 ENCOUNTER — Ambulatory Visit: Payer: Self-pay | Admitting: Endocrinology

## 2015-08-19 ENCOUNTER — Encounter: Payer: 59 | Admitting: Family Medicine

## 2015-08-27 ENCOUNTER — Encounter: Payer: 59 | Admitting: Family Medicine

## 2015-08-29 ENCOUNTER — Telehealth: Payer: Self-pay | Admitting: Family Medicine

## 2015-08-29 NOTE — Telephone Encounter (Signed)
Thank you TRAMADOL now prescribed by outside Ninah Moccio

## 2015-08-29 NOTE — Telephone Encounter (Signed)
Routing to provider  

## 2015-08-29 NOTE — Telephone Encounter (Signed)
Pt called wanted to inform Dr. Sanda Klein that her DO doctor @ [facility] will be taking over her Tramadol RX. Thanks.

## 2015-09-03 ENCOUNTER — Ambulatory Visit (INDEPENDENT_AMBULATORY_CARE_PROVIDER_SITE_OTHER): Payer: 59 | Admitting: Family Medicine

## 2015-09-03 ENCOUNTER — Encounter: Payer: Self-pay | Admitting: Family Medicine

## 2015-09-03 VITALS — BP 129/76 | HR 92 | Temp 97.6°F | Ht 61.25 in | Wt 238.0 lb

## 2015-09-03 DIAGNOSIS — Z1239 Encounter for other screening for malignant neoplasm of breast: Secondary | ICD-10-CM

## 2015-09-03 DIAGNOSIS — Z23 Encounter for immunization: Secondary | ICD-10-CM

## 2015-09-03 DIAGNOSIS — Z0000 Encounter for general adult medical examination without abnormal findings: Secondary | ICD-10-CM

## 2015-09-03 DIAGNOSIS — Z124 Encounter for screening for malignant neoplasm of cervix: Secondary | ICD-10-CM

## 2015-09-03 MED ORDER — LISINOPRIL 20 MG PO TABS: 20.0000 mg | ORAL_TABLET | Freq: Every day | ORAL | Status: DC

## 2015-09-03 NOTE — Patient Instructions (Addendum)
Check out the information at familydoctor.org entitled "What It Takes to Lose Weight" Try to lose between 1-2 pounds per week by taking in fewer calories and burning off more calories You can succeed by limiting portions, limiting foods dense in calories and fat, becoming more active, and drinking 8 glasses of water a day (64 ounces total) Don't skip meals, especially breakfast, as skipping meals may alter your metabolism Do not use over-the-counter weight loss pills or gimmicks that claim rapid weight loss A healthy BMI (or body mass index) is between 18.5 and 24.9 You can calculate your ideal BMI at the [place] website ClubMonetize.fr Drop BMI to under 30, weight will be 159  Pounds You can reach your goal by losing 1-2 pounds per week by this time next year Consider using myfitnesspal or other similar site  Health Maintenance, Female Adopting a healthy lifestyle and getting preventive care can go a long way to promote health and wellness. Talk with your health care provider about what schedule of regular examinations is right for you. This is a good chance for you to check in with your provider about disease prevention and staying healthy. In between checkups, there are plenty of things you can do on your own. Experts have done a lot of research about which lifestyle changes and preventive measures are most likely to keep you healthy. Ask your health care provider for more information. WEIGHT AND DIET  Eat a healthy diet  Be sure to include plenty of vegetables, fruits, low-fat dairy products, and lean protein.  Do not eat a lot of foods high in solid fats, added sugars, or salt.  Get regular exercise. This is one of the most important things you can do for your health.  Most adults should exercise for at least 150 minutes each week. The exercise should increase your heart rate and make you sweat (moderate-intensity exercise).  Most adults  should also do strengthening exercises at least twice a week. This is in addition to the moderate-intensity exercise.  Maintain a healthy weight  Body mass index (BMI) is a measurement that can be used to identify possible weight problems. It estimates body fat based on height and weight. Your health care provider can help determine your BMI and help you achieve or maintain a healthy weight.  For females 84 years of age and older:   A BMI below 18.5 is considered underweight.  A BMI of 18.5 to 24.9 is normal.  A BMI of 25 to 29.9 is considered overweight.  A BMI of 30 and above is considered obese.  Watch levels of cholesterol and blood lipids  You should start having your blood tested for lipids and cholesterol at 58 years of age, then have this test every 5 years.  You may need to have your cholesterol levels checked more often if:  Your lipid or cholesterol levels are high.  You are older than 58 years of age.  You are at high risk for heart disease.  CANCER SCREENING   Lung Cancer  Lung cancer screening is recommended for adults 41-33 years old who are at high risk for lung cancer because of a history of smoking.  A yearly low-dose CT scan of the lungs is recommended for people who:  Currently smoke.  Have quit within the past 15 years.  Have at least a 30-pack-year history of smoking. A pack year is smoking an average of one pack of cigarettes a day for 1 year.  Yearly screening should continue  until it has been 15 years since you quit.  Yearly screening should stop if you develop a health problem that would prevent you from having lung cancer treatment.  Breast Cancer  Practice breast self-awareness. This means understanding how your breasts normally appear and feel.  It also means doing regular breast self-exams. Let your health care provider know about any changes, no matter how small.  If you are in your 20s or 30s, you should have a clinical breast exam  (CBE) by a health care provider every 1-3 years as part of a regular health exam.  If you are 71 or older, have a CBE every year. Also consider having a breast X-ray (mammogram) every year.  If you have a family history of breast cancer, talk to your health care provider about genetic screening.  If you are at high risk for breast cancer, talk to your health care provider about having an MRI and a mammogram every year.  Breast cancer gene (BRCA) assessment is recommended for women who have family members with BRCA-related cancers. BRCA-related cancers include:  Breast.  Ovarian.  Tubal.  Peritoneal cancers.  Results of the assessment will determine the need for genetic counseling and BRCA1 and BRCA2 testing. Cervical Cancer Your health care provider may recommend that you be screened regularly for cancer of the pelvic organs (ovaries, uterus, and vagina). This screening involves a pelvic examination, including checking for microscopic changes to the surface of your cervix (Pap test). You may be encouraged to have this screening done every 3 years, beginning at age 40.  For women ages 59-65, health care providers may recommend pelvic exams and Pap testing every 3 years, or they may recommend the Pap and pelvic exam, combined with testing for human papilloma virus (HPV), every 5 years. Some types of HPV increase your risk of cervical cancer. Testing for HPV may also be done on women of any age with unclear Pap test results.  Other health care providers may not recommend any screening for nonpregnant women who are considered low risk for pelvic cancer and who do not have symptoms. Ask your health care provider if a screening pelvic exam is right for you.  If you have had past treatment for cervical cancer or a condition that could lead to cancer, you need Pap tests and screening for cancer for at least 20 years after your treatment. If Pap tests have been discontinued, your risk factors (such  as having a new sexual partner) need to be reassessed to determine if screening should resume. Some women have medical problems that increase the chance of getting cervical cancer. In these cases, your health care provider may recommend more frequent screening and Pap tests. Colorectal Cancer  This type of cancer can be detected and often prevented.  Routine colorectal cancer screening usually begins at 58 years of age and continues through 58 years of age.  Your health care provider may recommend screening at an earlier age if you have risk factors for colon cancer.  Your health care provider may also recommend using home test kits to check for hidden blood in the stool.  A small camera at the end of a tube can be used to examine your colon directly (sigmoidoscopy or colonoscopy). This is done to check for the earliest forms of colorectal cancer.  Routine screening usually begins at age 86.  Direct examination of the colon should be repeated every 5-10 years through 58 years of age. However, you may need to be  screened more often if early forms of precancerous polyps or small growths are found. Skin Cancer  Check your skin from head to toe regularly.  Tell your health care provider about any new moles or changes in moles, especially if there is a change in a mole's shape or color.  Also tell your health care provider if you have a mole that is larger than the size of a pencil eraser.  Always use sunscreen. Apply sunscreen liberally and repeatedly throughout the day.  Protect yourself by wearing long sleeves, pants, a wide-brimmed hat, and sunglasses whenever you are outside. HEART DISEASE, DIABETES, AND HIGH BLOOD PRESSURE   High blood pressure causes heart disease and increases the risk of stroke. High blood pressure is more likely to develop in:  People who have blood pressure in the high end of the normal range (130-139/85-89 mm Hg).  People who are overweight or obese.  People  who are African American.  If you are 6-69 years of age, have your blood pressure checked every 3-5 years. If you are 47 years of age or older, have your blood pressure checked every year. You should have your blood pressure measured twice--once when you are at a hospital or clinic, and once when you are not at a hospital or clinic. Record the average of the two measurements. To check your blood pressure when you are not at a hospital or clinic, you can use:  An automated blood pressure machine at a pharmacy.  A home blood pressure monitor.  If you are between 74 years and 41 years old, ask your health care provider if you should take aspirin to prevent strokes.  Have regular diabetes screenings. This involves taking a blood sample to check your fasting blood sugar level.  If you are at a normal weight and have a low risk for diabetes, have this test once every three years after 58 years of age.  If you are overweight and have a high risk for diabetes, consider being tested at a younger age or more often. PREVENTING INFECTION  Hepatitis B  If you have a higher risk for hepatitis B, you should be screened for this virus. You are considered at high risk for hepatitis B if:  You were born in a country where hepatitis B is common. Ask your health care provider which countries are considered high risk.  Your parents were born in a high-risk country, and you have not been immunized against hepatitis B (hepatitis B vaccine).  You have HIV or AIDS.  You use needles to inject street drugs.  You live with someone who has hepatitis B.  You have had sex with someone who has hepatitis B.  You get hemodialysis treatment.  You take certain medicines for conditions, including cancer, organ transplantation, and autoimmune conditions. Hepatitis C  Blood testing is recommended for:  Everyone born from [birth year] through 1964.  Anyone with known risk factors for hepatitis C. Sexually transmitted  infections (STIs)  You should be screened for sexually transmitted infections (STIs) including gonorrhea and chlamydia if:  You are sexually active and are younger than 58 years of age.  You are older than 58 years of age and your health care provider tells you that you are at risk for this type of infection.  Your sexual activity has changed since you were last screened and you are at an increased risk for chlamydia or gonorrhea. Ask your health care provider if you are at risk.  If you do  not have HIV, but are at risk, it may be recommended that you take a prescription medicine daily to prevent HIV infection. This is called pre-exposure prophylaxis (PrEP). You are considered at risk if:  You are sexually active and do not regularly use condoms or know the HIV status of your partner(s).  You take drugs by injection.  You are sexually active with a partner who has HIV. Talk with your health care provider about whether you are at high risk of being infected with HIV. If you choose to begin PrEP, you should first be tested for HIV. You should then be tested every 3 months for as long as you are taking PrEP.  PREGNANCY   If you are premenopausal and you may become pregnant, ask your health care provider about preconception counseling.  If you may become pregnant, take 400 to 800 micrograms (mcg) of folic acid every day.  If you want to prevent pregnancy, talk to your health care provider about birth control (contraception). OSTEOPOROSIS AND MENOPAUSE   Osteoporosis is a disease in which the bones lose minerals and strength with aging. This can result in serious bone fractures. Your risk for osteoporosis can be identified using a bone density scan.  If you are 74 years of age or older, or if you are at risk for osteoporosis and fractures, ask your health care provider if you should be screened.  Ask your health care provider whether you should take a calcium or vitamin D supplement to  lower your risk for osteoporosis.  Menopause may have certain physical symptoms and risks.  Hormone replacement therapy may reduce some of these symptoms and risks. Talk to your health care provider about whether hormone replacement therapy is right for you.  HOME CARE INSTRUCTIONS   Schedule regular health, dental, and eye exams.  Stay current with your immunizations.   Do not use any tobacco products including cigarettes, chewing tobacco, or electronic cigarettes.  If you are pregnant, do not drink alcohol.  If you are breastfeeding, limit how much and how often you drink alcohol.  Limit alcohol intake to no more than 1 drink per day for nonpregnant women. One drink equals 12 ounces of beer, 5 ounces of wine, or 1 ounces of hard liquor.  Do not use street drugs.  Do not share needles.  Ask your health care provider for help if you need support or information about quitting drugs.  Tell your health care provider if you often feel depressed.  Tell your health care provider if you have ever been abused or do not feel safe at home.   This information is not intended to replace advice given to you by your health care provider. Make sure you discuss any questions you have with your health care provider.   Document Released: 05/31/2011 Document Revised: 12/06/2014 Document Reviewed: 10/17/2013 Elsevier Interactive Patient Education [employer].

## 2015-09-03 NOTE — Progress Notes (Signed)
Patient ID: Sierra Copeland, female   DOB: 07-29-1956, 59 y.o.   MRN: [id number]  Subjective:   Sierra Copeland is a 59 y.o. female here for a complete physical exam  Interim issues since last visit: no medical excitement except spinal injection USPSTF grade A and B recommendations reviewed Alcohol: n/a Depression screen PHQ 2/9 09/03/2015  Decreased Interest 0  Down, Depressed, Hopeless 0  PHQ - 2 Score 0  HTN: controlled Obesity: it doesn't come off easy unless she cuts down her calories; she tries to cut her carbs; she has not tried Weight Watchers recently, maybe 10 years ago; had success with that; she is not interested in seeing a nutritionist Tobacco: n/a HIV, Hep B, Hep C: patient politely declined testing BRCA: paternal aunts have breast cancer; no ovarian cancer; no 1st degree relatives Mammograms: UTD Abuse: no Cervical cancer: today, no hx of abnormal Colorectal cancer: done 2015; due 2020 per patient Lung cancer: n/a Osteoporosis: n/a Aspirin: daily Fall prevention: careful on stairs Skin: no tanning beds; sunscreen  Past Medical History  Diagnosis Date  . Diabetes mellitus without complication ([place])   . Hypertension   . Hyperlipemia   . Wears glasses   . Arthritis   . GERD (gastroesophageal reflux disease)     occ-no meds  . Depression   . PONV (postoperative nausea and vomiting)   . Collagenous colitis   . Osteoarthritis     seen by Rheum, not enoug evidence to support RA  . Rosacea   . Seborrheic dermatitis   . Abnormal mammogram   . Pinched nerve    Past Surgical History  Procedure Laterality Date  . Uvulopalatopharyngoplasty  1998    and tonsils-tongue pexi  . Breast surgery  2011    lt br bx-non cancer  . Finger exploration  2013    left long   . Carpal tunnel release      right and left  . Achilles tendon repair  2007    right  . Colonoscopy    . Tonsillectomy    . Nerve exploration Left 10/03/2013    Procedure: NERVE EXPLORATION/  LEFT LONG RADIAL DISTAL NERVE neurolysis;  Surgeon: Schuyler Amor, MD;  Location: [facility];  Service: Orthopedics;  Laterality: Left;   Family History  Problem Relation Age of Onset  . Cancer Mother     liver  . Hypertension Mother   . Hyperlipidemia Mother   . Hyperlipidemia Father   . Hypertension Father   . Diabetes Maternal Aunt   . Diabetes Maternal Uncle   . Heart disease Maternal Uncle   . Cancer Paternal Aunt     breast  . Diabetes Paternal Aunt   . Diabetes Paternal Uncle   . Cancer Paternal Uncle     prostate  . Stroke Maternal Grandfather   . Diabetes Paternal Grandmother   . Stroke Paternal Grandmother   . Cancer Paternal Aunt     breast   Social History  Substance Use Topics  . Smoking status: Former Smoker -- 1.00 packs/day for 10 years    Types: Cigarettes    Quit date: 09/28/1986  . Smokeless tobacco: Never Used  . Alcohol Use: No   Review of Systems  Constitutional: Negative for unexpected weight change.  HENT: Negative for hearing loss, nosebleeds and sore throat.   Eyes: Negative for visual disturbance.  Cardiovascular: Negative for chest pain.       No lumps in breast  Gastrointestinal: Negative for  blood in stool.  Endocrine: Positive for heat intolerance. Negative for polydipsia and polyuria.  Genitourinary: Negative for hematuria, vaginal discharge and pelvic pain.  Musculoskeletal: Negative for joint swelling.  Skin:       No worrisome moles  Allergic/Immunologic: Negative for food allergies.  Neurological: Negative for tremors.  Hematological: Negative for adenopathy. Does not bruise/bleed easily.  Psychiatric/Behavioral: Positive for sleep disturbance (when she hurts).   Objective:   Filed Vitals:   09/03/15 0857  BP: 129/76  Pulse: 92  Temp: 97.6 F (36.4 C)  Height: 5' 1.25" (1.556 m)  Weight: 238 lb (107.956 kg)  SpO2: 97%   Body mass index is 44.59 kg/(m^2). Wt Readings from Last 3 Encounters:   09/03/15 238 lb (107.956 kg)  07/10/15 248 lb (112.492 kg)  06/16/15 247 lb (112.038 kg)   Physical Exam  Constitutional: She appears well-developed and well-nourished.  obese  HENT:  Head: Normocephalic and atraumatic.  Eyes: Conjunctivae and EOM are normal. Right eye exhibits no hordeolum. Left eye exhibits no hordeolum. No scleral icterus.  Neck: Carotid bruit is not present. No thyromegaly present.  Cardiovascular: Normal rate, regular rhythm, S1 normal, S2 normal and normal heart sounds.   No extrasystoles are present.  Pulmonary/Chest: Effort normal and breath sounds normal. No respiratory distress. Right breast exhibits no inverted nipple, no mass, no nipple discharge, no skin change and no tenderness. Left breast exhibits no inverted nipple, no mass, no nipple discharge, no skin change and no tenderness. Breasts are symmetrical.  Abdominal: Soft. Normal appearance and bowel sounds are normal. She exhibits no distension, no abdominal bruit, no pulsatile midline mass and no mass. There is no hepatosplenomegaly. There is no tenderness. No hernia.  Genitourinary: Uterus normal. Pelvic exam was performed with patient prone. There is no rash or lesion on the right labia. There is no rash or lesion on the left labia. Cervix exhibits no motion tenderness. Right adnexum displays no mass, no tenderness and no fullness. Left adnexum displays no mass, no tenderness and no fullness.  Musculoskeletal: Normal range of motion. She exhibits no edema.  Lymphadenopathy:       Head (right side): No submandibular adenopathy present.       Head (left side): No submandibular adenopathy present.    She has no cervical adenopathy.    She has no axillary adenopathy.  Neurological: She is alert. She displays no tremor. No cranial nerve deficit. She exhibits normal muscle tone. Gait normal.  Skin: Skin is warm and dry. No bruising and no ecchymosis noted. No cyanosis. No pallor.  Psychiatric: Her speech is  normal and behavior is normal. Thought content normal. Her mood appears not anxious. She does not exhibit a depressed mood.   Assessment/Plan:   Problem List Items Addressed This Visit      Other   Morbid obesity ([place])    Praised patient for weight loss; see AVS for information, recommendations      Relevant Medications   dapagliflozin propanediol (FARXIGA) 10 MG TABS tablet   Needs flu shot    Flu vaccine offered and given today      Breast cancer screening    Mammograms every 1-2 years      Relevant Orders   MM DIGITAL SCREENING BILATERAL   Preventative health care - Primary    USPSTF grade A and B recommendations reviewed with patient; age-appropriate recommendations, preventive care, screening tests, etc discussed and encouraged; healthy living encouraged; see AVS for patient education given to patient  Screening for cervical cancer    Pap smear every 3-5 years with co-testing      Relevant Orders   Pap liquid-based and HPV (high risk) (Completed)    Other Visit Diagnoses    Encounter for immunization            Meds ordered this encounter  Medications  . dapagliflozin propanediol (FARXIGA) 10 MG TABS tablet    Sig: Take 10 mg by mouth daily.  . Desoximetasone (TOPICORT EX)    Sig: Apply topically as needed.  Marland Kitchen lisinopril (PRINIVIL,ZESTRIL) 20 MG tablet    Sig: Take 1 tablet (20 mg total) by mouth daily.    Dispense:  90 tablet    Refill:  1    Pharmacist -- change in dose, cancel the 40s   Orders Placed This Encounter  Procedures  . MM DIGITAL SCREENING BILATERAL    Standing Status: Future     Number of Occurrences:      Standing Expiration Date: 09/02/2016    Order Specific Question:  Reason for Exam (SYMPTOM  OR DIAGNOSIS REQUIRED)    Answer:  screening    Order Specific Question:  Is the patient pregnant?    Answer:  No    Order Specific Question:  Preferred imaging location?    Answer:  [facility]  . Flu Vaccine QUAD 36+ mos IM    Follow up plan: Return in about 1 year (around 09/02/2016) for physical, next visit with me in 3 months.  An after-visit summary was printed and given to the patient at [facility].  Please see the patient instructions which may contain other information and recommendations beyond what is mentioned above in the assessment and plan.

## 2015-09-08 DIAGNOSIS — Z0000 Encounter for general adult medical examination without abnormal findings: Secondary | ICD-10-CM | POA: Insufficient documentation

## 2015-09-08 DIAGNOSIS — Z1239 Encounter for other screening for malignant neoplasm of breast: Secondary | ICD-10-CM | POA: Insufficient documentation

## 2015-09-08 DIAGNOSIS — Z124 Encounter for screening for malignant neoplasm of cervix: Secondary | ICD-10-CM | POA: Insufficient documentation

## 2015-09-08 NOTE — Assessment & Plan Note (Signed)
Pap smear every 3-5 years with co-testing

## 2015-09-08 NOTE — Assessment & Plan Note (Signed)
Praised patient for weight loss; see AVS for information, recommendations

## 2015-09-08 NOTE — Assessment & Plan Note (Signed)
USPSTF grade A and B recommendations reviewed with patient; age-appropriate recommendations, preventive care, screening tests, etc discussed and encouraged; healthy living encouraged; see AVS for patient education given to patient  

## 2015-09-08 NOTE — Assessment & Plan Note (Signed)
Mammograms every 1-2 years

## 2015-09-08 NOTE — Assessment & Plan Note (Signed)
Flu vaccine offered and given today

## 2015-09-09 LAB — PAP LB AND HPV HIGH-RISK: PAP SMEAR COMMENT: 0

## 2015-11-28 ENCOUNTER — Other Ambulatory Visit: Payer: Self-pay | Admitting: Family Medicine

## 2015-11-29 NOTE — Telephone Encounter (Signed)
Last sgpt reviewed; rx approved; she'll be due for labs and visit in January

## 2015-11-30 HISTORY — PX: OTHER SURGICAL HISTORY: SHX169

## 2015-12-04 ENCOUNTER — Encounter: Payer: Self-pay | Admitting: Family Medicine

## 2015-12-04 ENCOUNTER — Ambulatory Visit (INDEPENDENT_AMBULATORY_CARE_PROVIDER_SITE_OTHER): Payer: 59 | Admitting: Family Medicine

## 2015-12-04 VITALS — BP 121/77 | HR 85 | Temp 97.8°F | Ht 61.0 in | Wt 231.0 lb

## 2015-12-04 DIAGNOSIS — K52831 Collagenous colitis: Secondary | ICD-10-CM | POA: Diagnosis not present

## 2015-12-04 DIAGNOSIS — I10 Essential (primary) hypertension: Secondary | ICD-10-CM

## 2015-12-04 DIAGNOSIS — Z5181 Encounter for therapeutic drug level monitoring: Secondary | ICD-10-CM

## 2015-12-04 DIAGNOSIS — E559 Vitamin D deficiency, unspecified: Secondary | ICD-10-CM

## 2015-12-04 DIAGNOSIS — E7800 Pure hypercholesterolemia, unspecified: Secondary | ICD-10-CM | POA: Diagnosis not present

## 2015-12-04 DIAGNOSIS — Z794 Long term (current) use of insulin: Secondary | ICD-10-CM

## 2015-12-04 DIAGNOSIS — E1165 Type 2 diabetes mellitus with hyperglycemia: Secondary | ICD-10-CM

## 2015-12-04 DIAGNOSIS — Z23 Encounter for immunization: Secondary | ICD-10-CM | POA: Diagnosis not present

## 2015-12-04 NOTE — Assessment & Plan Note (Signed)
Check liver and kidneys on medicine

## 2015-12-04 NOTE — Assessment & Plan Note (Signed)
Really coming down; praise given; continue limiting calories, try to identify and address hurdles to weight loss

## 2015-12-04 NOTE — Assessment & Plan Note (Signed)
FMLA paper work filled out 

## 2015-12-04 NOTE — Assessment & Plan Note (Signed)
Managed by Dr. Solum 

## 2015-12-04 NOTE — Assessment & Plan Note (Signed)
Check lipids later this morning, fasting; limit saturated fats

## 2015-12-04 NOTE — Assessment & Plan Note (Signed)
Well-controlled; continue DASH guidelines, weight loss

## 2015-12-04 NOTE — Progress Notes (Signed)
BP 121/77 mmHg  Pulse 85  Temp(Src) 97.8 F (36.6 C)  Ht 5\' 1"  (1.549 m)  Wt 231 lb (104.781 kg)  BMI 43.67 kg/m2  SpO2 98%   Subjective:    Patient ID: Sierra Copeland, female    DOB: 1956-09-10, 60 y.o.   MRN: [id number]  HPI: Sierra Copeland is a 60 y.o. female  Chief Complaint  Patient presents with  . Obesity    "we are following up on my weight"  . Paperwork    she needs fmla papers completed  . Immunizations    she would like to get her Teatnus updated   Barriers to weight loss include inability to exercise because of the knees and the back; back is not surgically correctabale at this time; she has not really think about water Thinking about eating better; down 17 pounds since August; Farxiga has helped too Feels better, but the pain has not decreased at all Cold weather bothers knees  Paperwork is for the colitis; she missed nine days last year; comes and goes; spicy foods and stress are triggers; she sees Dr. Candace Cruise for that when needed; she saw the PA last time she went   High cholesterol; taking meds, no problems, no body aches; no abd pain; limiting pork products  She would like a tetanus booster; no recent injuries  Diabetes; followed by endocrinologist, Dr. Gabriel Carina; doing well; FSBS average 144; last A1c in August 8.5  HTN; on ACE-I alone; checks BP away from here, same 120s over 70s  Relevant past medical, surgical, family and social history reviewed and updated as indicated. Interim medical history since our last visit reviewed. May have to have EGD because of muscle spasms Allergies and medications reviewed and updated.  Review of Systems Per HPI unless specifically indicated above     Objective:    BP 121/77 mmHg  Pulse 85  Temp(Src) 97.8 F (36.6 C)  Ht 5\' 1"  (1.549 m)  Wt 231 lb (104.781 kg)  BMI 43.67 kg/m2  SpO2 98%  Wt Readings from Last 3 Encounters:  12/04/15 231 lb (104.781 kg)  09/03/15 238 lb (107.956 kg)  07/10/15 248 lb (112.492  kg)    Physical Exam  Constitutional: She appears well-developed and well-nourished. No distress.  Morbidly obese, but down 17 pounds since August (over last 4-1/2 months)  HENT:  Head: Normocephalic and atraumatic.  Eyes: EOM are normal. No scleral icterus.  Neck: No JVD present.  Cardiovascular: Normal rate and regular rhythm.   Pulmonary/Chest: Effort normal and breath sounds normal.  Abdominal: Soft. Bowel sounds are normal. She exhibits no distension. There is no tenderness.  Neurological: She is alert.  Skin: Skin is warm. No rash noted. No pallor.  Psychiatric: She has a normal mood and affect. Her behavior is normal.    Results for orders placed or performed in visit on 09/03/15  Pap liquid-based and HPV (high risk)  Result Value Ref Range   DIAGNOSIS: Comment    Specimen adequacy: Comment    CLINICIAN PROVIDED ICD10: Comment    Performed by: Comment    QC reviewed by: Comment    PAP SMEAR COMMENT .    Note: Comment       Assessment & Plan:   Problem List Items Addressed This Visit      Cardiovascular and Mediastinum   Essential hypertension, benign    Well-controlled; continue DASH guidelines, weight loss        Digestive   Collagenous colitis  FMLA paperwork filled out        Endocrine   Type II diabetes mellitus, uncontrolled ([place])    Managed by Dr. Gabriel Carina      Relevant Medications   Dapagliflozin-Metformin HCl ER (XIGDUO XR) 03-999 MG TB24     Other   Hypercholesteremia - Primary    Check lipids later this morning, fasting; limit saturated fats      Relevant Orders   Lipid Panel w/o Chol/HDL Ratio   Morbid obesity ([place])    Really coming down; praise given; continue limiting calories, try to identify and address hurdles to weight loss      Relevant Medications   Dapagliflozin-Metformin HCl ER (XIGDUO XR) 03-999 MG TB24   Medication monitoring encounter    Check liver and kidneys on medicine      Relevant Orders   Comprehensive  metabolic panel   Need for Tdap vaccination   Relevant Orders   Tdap vaccine greater than or equal to 7yo IM (Completed)    Other Visit Diagnoses    Vitamin D deficiency        Relevant Orders    VITAMIN D 25 Hydroxy (Vit-D Deficiency, Fractures)        Follow up plan: Return in about 6 months (around 06/02/2016) for thirty minute follow-up with fasting labs.  Orders Placed This Encounter  Procedures  . Tdap vaccine greater than or equal to 7yo IM  . Lipid Panel w/o Chol/HDL Ratio  . Comprehensive metabolic panel  . VITAMIN D 25 Hydroxy (Vit-D Deficiency, Fractures)   An after-visit summary was printed and given to the patient at [facility].  Please see the patient instructions which may contain other information and recommendations beyond what is mentioned above in the assessment and plan.

## 2015-12-04 NOTE — Patient Instructions (Addendum)
Try turmeric as a natural anti-inflammatory (for pain and arthritis). It comes in capsules where you buy aspirin and fish oil, but also as a spice where you buy pepper and garlic powder. Try drinking 4 ounces of tonic water in the evening Have fasting labs done in late January or February Keep up the amazing job you are doing losing weight!! Check out the information at [employer] entitled "What It Takes to Lose Weight" Try to lose between 1-2 pounds per week by taking in fewer calories and burning off more calories You can succeed by limiting portions, limiting foods dense in calories and fat, becoming more active, and drinking 8 glasses of water a day (64 ounces) Don't skip meals, especially breakfast, as skipping meals may alter your metabolism Do not use over-the-counter weight loss pills or gimmicks that claim rapid weight loss A healthy BMI (or body mass index) is between 18.5 and 24.9 You can calculate your ideal BMI at the [place] website [employer] You received the vaccine to protect against tetanus and diphtheria and pertussis today; the tetanus and diphtheria portions will provide protection up to ten years, and the pertussis component will give you protection against whooping cough for life Return in 6 months, sooner if needed

## 2016-01-16 NOTE — Discharge Instructions (Signed)

## 2016-01-21 ENCOUNTER — Encounter
Admission: RE | Disposition: A | Discharge status: Home / Self Care | Payer: Self-pay | Source: Ambulatory Visit | Attending: Gastroenterology

## 2016-01-21 ENCOUNTER — Ambulatory Visit
Admission: RE | Admit: 2016-01-21 | Discharge: 2016-01-21 | Disposition: A | Discharge status: Home / Self Care | Payer: 59 | Source: Ambulatory Visit | Attending: Gastroenterology | Admitting: Gastroenterology

## 2016-01-21 ENCOUNTER — Ambulatory Visit: Payer: 59 | Admitting: Anesthesiology

## 2016-01-21 ENCOUNTER — Other Ambulatory Visit: Payer: Self-pay | Admitting: Family Medicine

## 2016-01-21 DIAGNOSIS — Z79899 Other long term (current) drug therapy: Secondary | ICD-10-CM | POA: Diagnosis not present

## 2016-01-21 DIAGNOSIS — M170 Bilateral primary osteoarthritis of knee: Secondary | ICD-10-CM | POA: Insufficient documentation

## 2016-01-21 DIAGNOSIS — Z7982 Long term (current) use of aspirin: Secondary | ICD-10-CM | POA: Insufficient documentation

## 2016-01-21 DIAGNOSIS — E119 Type 2 diabetes mellitus without complications: Secondary | ICD-10-CM | POA: Diagnosis not present

## 2016-01-21 DIAGNOSIS — M4806 Spinal stenosis, lumbar region: Secondary | ICD-10-CM | POA: Insufficient documentation

## 2016-01-21 DIAGNOSIS — E785 Hyperlipidemia, unspecified: Secondary | ICD-10-CM | POA: Insufficient documentation

## 2016-01-21 DIAGNOSIS — R1013 Epigastric pain: Secondary | ICD-10-CM | POA: Diagnosis present

## 2016-01-21 DIAGNOSIS — Z86010 Personal history of colonic polyps: Secondary | ICD-10-CM | POA: Insufficient documentation

## 2016-01-21 DIAGNOSIS — K2950 Unspecified chronic gastritis without bleeding: Secondary | ICD-10-CM | POA: Insufficient documentation

## 2016-01-21 DIAGNOSIS — Z791 Long term (current) use of non-steroidal anti-inflammatories (NSAID): Secondary | ICD-10-CM | POA: Insufficient documentation

## 2016-01-21 DIAGNOSIS — M5136 Other intervertebral disc degeneration, lumbar region: Secondary | ICD-10-CM | POA: Diagnosis not present

## 2016-01-21 DIAGNOSIS — M7541 Impingement syndrome of right shoulder: Secondary | ICD-10-CM | POA: Insufficient documentation

## 2016-01-21 DIAGNOSIS — R1310 Dysphagia, unspecified: Secondary | ICD-10-CM | POA: Insufficient documentation

## 2016-01-21 DIAGNOSIS — M5416 Radiculopathy, lumbar region: Secondary | ICD-10-CM | POA: Diagnosis not present

## 2016-01-21 DIAGNOSIS — Z7951 Long term (current) use of inhaled steroids: Secondary | ICD-10-CM | POA: Insufficient documentation

## 2016-01-21 DIAGNOSIS — I10 Essential (primary) hypertension: Secondary | ICD-10-CM | POA: Insufficient documentation

## 2016-01-21 HISTORY — DX: Dorsalgia, unspecified: M54.9

## 2016-01-21 HISTORY — DX: Headache, unspecified: R51.9

## 2016-01-21 HISTORY — DX: Motion sickness, initial encounter: T75.3XXA

## 2016-01-21 HISTORY — DX: Headache: R51

## 2016-01-21 HISTORY — PX: ESOPHAGOGASTRODUODENOSCOPY: SHX5428

## 2016-01-21 LAB — GLUCOSE, CAPILLARY
Glucose-Capillary: 203 mg/dL — ABNORMAL HIGH (ref 65–99)
Glucose-Capillary: 168 mg/dL — ABNORMAL HIGH (ref 65–99)

## 2016-01-21 SURGERY — EGD (ESOPHAGOGASTRODUODENOSCOPY)
Anesthesia: Monitor Anesthesia Care

## 2016-01-21 MED ORDER — LACTATED RINGERS IV SOLN
INTRAVENOUS | Status: DC
  Administered 2016-01-21: 10:00:00 via INTRAVENOUS

## 2016-01-21 MED ORDER — PROPOFOL 10 MG/ML IV BOLUS
INTRAVENOUS | Status: DC | PRN
  Administered 2016-01-21: 20 mg via INTRAVENOUS
  Administered 2016-01-21: 30 mg via INTRAVENOUS
  Administered 2016-01-21: 70 mg via INTRAVENOUS
  Administered 2016-01-21: 20 mg via INTRAVENOUS

## 2016-01-21 MED ORDER — GLYCOPYRROLATE 0.2 MG/ML IJ SOLN
INTRAMUSCULAR | Status: DC | PRN
  Administered 2016-01-21: 0.2 mg via INTRAVENOUS

## 2016-01-21 MED ORDER — LIDOCAINE HCL (CARDIAC) 20 MG/ML IV SOLN
INTRAVENOUS | Status: DC | PRN
  Administered 2016-01-21: 50 mg via INTRAVENOUS

## 2016-01-21 MED ORDER — STERILE WATER FOR IRRIGATION IR SOLN
Status: DC | PRN
  Administered 2016-01-21: 11:00:00

## 2016-01-21 SURGICAL SUPPLY — 41 items
BALLN DILATOR 10-12 8 (BALLOONS)
BALLN DILATOR 12-15 8 (BALLOONS)
BALLN DILATOR 15-18 8 (BALLOONS)
BALLN DILATOR CRE 0-12 8 (BALLOONS)
BALLN DILATOR ESOPH 8 10 CRE (MISCELLANEOUS) IMPLANT
BALLOON DILATOR 12-15 8 (BALLOONS) IMPLANT
BALLOON DILATOR 15-18 8 (BALLOONS) IMPLANT
BALLOON DILATOR CRE 0-12 8 (BALLOONS) IMPLANT
BLOCK BITE 60FR ADLT L/F GRN (MISCELLANEOUS) ×2 IMPLANT
CANISTER SUCT 1200ML W/VALVE (MISCELLANEOUS) ×2 IMPLANT
FCP ESCP3.2XJMB 240X2.8X (MISCELLANEOUS)
FORCEPS BIOP RAD 4 LRG CAP 4 (CUTTING FORCEPS) IMPLANT
FORCEPS BIOP RJ4 240 W/NDL (MISCELLANEOUS)
FORCEPS ESCP3.2XJMB 240X2.8X (MISCELLANEOUS) IMPLANT
GOWN CVR UNV OPN BCK APRN NK (MISCELLANEOUS) ×1 IMPLANT
GOWN ISOL THUMB LOOP REG UNIV (MISCELLANEOUS) ×1
GOWN STRL REUS W/ TWL LRG LVL3 (GOWN DISPOSABLE) ×1 IMPLANT
GOWN STRL REUS W/TWL LRG LVL3 (GOWN DISPOSABLE) ×1
HEMOCLIP INSTINCT (CLIP) IMPLANT
INJECTOR VARIJECT VIN23 (MISCELLANEOUS) IMPLANT
KIT CO2 TUBING (TUBING) IMPLANT
KIT DEFENDO VALVE AND CONN (KITS) IMPLANT
KIT ENDO PROCEDURE OLY (KITS) ×2 IMPLANT
LIGATOR MULTIBAND 6SHOOTER MBL (MISCELLANEOUS) IMPLANT
MARKER SPOT ENDO TATTOO 5ML (MISCELLANEOUS) IMPLANT
PAD GROUND ADULT SPLIT (MISCELLANEOUS) IMPLANT
SNARE SHORT THROW 13M SML OVAL (MISCELLANEOUS) IMPLANT
SNARE SHORT THROW 30M LRG OVAL (MISCELLANEOUS) IMPLANT
SPOT EX ENDOSCOPIC TATTOO (MISCELLANEOUS)
SUCTION POLY TRAP 4CHAMBER (MISCELLANEOUS) IMPLANT
SYR INFLATION 60ML (SYRINGE) IMPLANT
TRAP SUCTION POLY (MISCELLANEOUS) IMPLANT
TUBING CONN 6MMX3.1M (TUBING)
TUBING SUCTION CONN 0.25 STRL (TUBING) IMPLANT
UNDERPAD 30X60 958B10 (PK) (MISCELLANEOUS) IMPLANT
VALVE BIOPSY ENDO (VALVE) IMPLANT
VARIJECT INJECTOR VIN23 (MISCELLANEOUS)
WATER AUXILLARY (MISCELLANEOUS) IMPLANT
WATER STERILE IRR 250ML POUR (IV SOLUTION) ×2 IMPLANT
WATER STERILE IRR 500ML POUR (IV SOLUTION) IMPLANT
WIRE CRE 18-20MM 8CM F G (MISCELLANEOUS) IMPLANT

## 2016-01-21 NOTE — Telephone Encounter (Signed)
Please remind patient to get labs done soon; I'll send refill, but it's important for me to monitor her blood work; thank you

## 2016-01-21 NOTE — Anesthesia Preprocedure Evaluation (Signed)
Anesthesia Evaluation  Patient identified by MRN, date of birth, ID band  History of Anesthesia Complications (+) PONV  Airway Mallampati: III  TM Distance: >3 FB     Dental   Pulmonary former smoker,    breath sounds clear to auscultation       Cardiovascular hypertension, Pt. on medications  Rate:Normal     Neuro/Psych    GI/Hepatic   Endo/Other  diabetes, Well Controlled, Type 2  Renal/GU      Musculoskeletal   Abdominal   Peds  Hematology   Anesthesia Other Findings   Reproductive/Obstetrics                             Anesthesia Physical Anesthesia Plan  ASA: III  Anesthesia Plan: MAC   Post-op Pain Management:    Induction:   Airway Management Planned:   Additional Equipment:   Intra-op Plan:   Post-operative Plan:   Informed Consent: I have reviewed the patients History and Physical, chart, labs and discussed the procedure including the risks, benefits and alternatives for the proposed anesthesia with the patient or authorized representative who has indicated his/her understanding and acceptance.     Plan Discussed with: CRNA  Anesthesia Plan Comments:         Anesthesia Quick Evaluation

## 2016-01-21 NOTE — Transfer of Care (Signed)
Immediate Anesthesia Transfer of Care Note  Patient: Sierra Copeland  Procedure(s) Performed: Procedure(s) with comments: ESOPHAGOGASTRODUODENOSCOPY (EGD) (N/A) - Please call at work [phone] Diabetic - insulin  Patient Location: PACU  Anesthesia Type: MAC  Level of Consciousness: awake, alert  and patient cooperative  Airway and Oxygen Therapy: Patient Spontanous Breathing and Patient connected to supplemental oxygen  Post-op Assessment: Post-op Vital signs reviewed, Patient's Cardiovascular Status Stable, Respiratory Function Stable, Patent Airway and No signs of Nausea or vomiting  Post-op Vital Signs: Reviewed and stable  Complications: No apparent anesthesia complications

## 2016-01-21 NOTE — Anesthesia Procedure Notes (Signed)
Procedure Name: MAC Performed by: Evelyna Folker Pre-anesthesia Checklist: Patient identified, Emergency Drugs available, Suction available, Timeout performed and Patient being monitored Patient Re-evaluated:Patient Re-evaluated prior to inductionOxygen Delivery Method: Nasal cannula Placement Confirmation: positive ETCO2       

## 2016-01-21 NOTE — H&P (Signed)
  Date of Initial H&P: 01/06/2016 History reviewed, patient examined, no change in status, stable for surgery.

## 2016-01-21 NOTE — Op Note (Signed)
[facility] Gastroenterology Patient Name: Sierra Copeland Procedure Date: 01/21/2016 10:34 AM MRN: [id number] Account #: [id number] Date of Birth: 1956/04/27 Admit Type: Outpatient Age: 60 Room: [facility] OR ROOM 01 Gender: Female Note Status: Finalized Procedure:            Upper GI endoscopy Indications:          Indigestion, Dysphagia Providers:            Lupita Dawn. Candace Cruise, MD Referring MD:         Arnetha Courser (Referring MD) Medicines:            Monitored Anesthesia Care Complications:        No immediate complications. Procedure:            Pre-Anesthesia Assessment:                       - Prior to the procedure, a History and Physical was                        performed, and patient medications, allergies and                        sensitivities were reviewed. The patient's tolerance of                        previous anesthesia was reviewed.                       - The risks and benefits of the procedure and the                        sedation options and risks were discussed with the                        patient. All questions were answered and informed                        consent was obtained.                       - After reviewing the risks and benefits, the patient                        was deemed in satisfactory condition to undergo the                        procedure.                       After obtaining informed consent, the endoscope was                        passed under direct vision. Throughout the procedure,                        the patient's blood pressure, pulse, and oxygen                        saturations were monitored continuously. The Olympus  [id number] Endoscope (S#. [id number]) was introduced                        through the mouth, and advanced to the second part of                        duodenum. The upper GI endoscopy was accomplished                        without difficulty. The patient tolerated the  procedure                        well. Findings:      No endoscopic abnormality was evident in the esophagus to explain the       patient's complaint of dysphagia. It was decided, however, to proceed       with dilation of the entire esophagus. The scope was withdrawn. Dilation       was performed with a Maloney dilator with no resistance at 54 Fr.      The entire examined stomach was normal. Biopsies were taken with a cold       forceps for histology.      The examined duodenum was normal. Impression:           - No endoscopic esophageal abnormality to explain                        patient's dysphagia. Esophagus dilated. Dilated.                       - Normal stomach. Biopsied.                       - Normal examined duodenum. Recommendation:       - Discharge patient to home.                       - Observe patient's clinical course. Procedure Code(s):    --- Professional ---                       [phone], GC, Esophagogastroduodenoscopy, flexible,                        transoral; with biopsy, single or multiple                       43450, Dilation of esophagus, by unguided sound or                        bougie, single or multiple passes Diagnosis Code(s):    --- Professional ---                       R13.10, Dysphagia, unspecified                       K30, Functional dyspepsia CPT copyright 2016 American Medical Association. All rights reserved. The codes documented in this report are preliminary and upon coder review may  be revised to meet current compliance requirements. Hulen Luster, MD 01/21/2016 10:53:04 AM This report has been signed electronically. Number of Addenda: 0 Note Initiated On: 01/21/2016 10:34 AM Total Procedure Duration:  0 hours 5 minutes 13 seconds       [facility]

## 2016-01-21 NOTE — Anesthesia Postprocedure Evaluation (Signed)
Anesthesia Post Note  Patient: Sierra Copeland  Procedure(s) Performed: Procedure(s) (LRB): ESOPHAGOGASTRODUODENOSCOPY (EGD) (N/A)  Patient location during evaluation: PACU Anesthesia Type: General Level of consciousness: awake and alert Pain management: pain level controlled Vital Signs Assessment: post-procedure vital signs reviewed and stable Respiratory status: spontaneous breathing, nonlabored ventilation, respiratory function stable and patient connected to nasal cannula oxygen Cardiovascular status: blood pressure returned to baseline and stable Postop Assessment: no signs of nausea or vomiting Anesthetic complications: no    Marshell Levan

## 2016-01-22 ENCOUNTER — Encounter: Payer: Self-pay | Admitting: Gastroenterology

## 2016-01-22 NOTE — Telephone Encounter (Signed)
Patient notified

## 2016-01-23 LAB — SURGICAL PATHOLOGY

## 2016-03-01 LAB — HM MAMMOGRAPHY

## 2016-03-17 ENCOUNTER — Encounter: Payer: Self-pay | Admitting: Unknown Physician Specialty

## 2016-03-17 ENCOUNTER — Ambulatory Visit (INDEPENDENT_AMBULATORY_CARE_PROVIDER_SITE_OTHER): Payer: 59 | Admitting: Unknown Physician Specialty

## 2016-03-17 VITALS — BP 141/83 | HR 74 | Temp 97.9°F | Ht 61.0 in | Wt 231.6 lb

## 2016-03-17 DIAGNOSIS — J069 Acute upper respiratory infection, unspecified: Secondary | ICD-10-CM | POA: Diagnosis not present

## 2016-03-17 MED ORDER — BENZONATATE 100 MG PO CAPS: 100.0000 mg | ORAL_CAPSULE | Freq: Two times a day (BID) | ORAL | Status: DC | PRN

## 2016-03-17 NOTE — Progress Notes (Signed)
BP 141/83 mmHg  Pulse 74  Temp(Src) 97.9 F (36.6 C)  Ht 5\' 1"  (1.549 m)  Wt 231 lb 9.6 oz (105.053 kg)  BMI 43.78 kg/m2  SpO2 99%  LMP 03/17/2013 (Approximate)   Subjective:    Patient ID: Sierra Copeland, female    DOB: 1956-05-04, 60 y.o.   MRN: [id number]  HPI: Sierra Copeland is a 60 y.o. female  Chief Complaint  Patient presents with  . URI    pt states she has had a cough, ear ache, nasal congestion, dizziness, upset stomach and sinus pressure. States symptoms started Monday   URI  This is a new problem. The current episode started in the past 7 days. The problem has been gradually worsening. There has been no fever. Associated symptoms include congestion, coughing, ear pain, nausea, rhinorrhea, sinus pain, sneezing, a sore throat and swollen glands. Pertinent negatives include no abdominal pain, chest pain, dysuria, joint pain, joint swelling, neck pain, plugged ear sensation, rash, vomiting or wheezing. She has tried decongestant and acetaminophen for the symptoms. The treatment provided mild relief.     Relevant past medical, surgical, family and social history reviewed and updated as indicated. Interim medical history since our last visit reviewed. Allergies and medications reviewed and updated.  Review of Systems  HENT: Positive for congestion, ear pain, rhinorrhea, sneezing and sore throat.   Respiratory: Positive for cough. Negative for wheezing.   Cardiovascular: Negative for chest pain.  Gastrointestinal: Positive for nausea. Negative for vomiting and abdominal pain.  Genitourinary: Negative for dysuria.  Musculoskeletal: Negative for joint pain and neck pain.  Skin: Negative for rash.    Per HPI unless specifically indicated above     Objective:    BP 141/83 mmHg  Pulse 74  Temp(Src) 97.9 F (36.6 C)  Ht 5\' 1"  (1.549 m)  Wt 231 lb 9.6 oz (105.053 kg)  BMI 43.78 kg/m2  SpO2 99%  LMP 03/17/2013 (Approximate)  Wt Readings from Last 3  Encounters:  03/17/16 231 lb 9.6 oz (105.053 kg)  01/21/16 229 lb (103.874 kg)  12/04/15 231 lb (104.781 kg)    Physical Exam  Constitutional: She is oriented to person, place, and time. She appears well-developed and well-nourished. No distress.  HENT:  Head: Normocephalic and atraumatic.  Right Ear: Tympanic membrane and ear canal normal.  Left Ear: Tympanic membrane and ear canal normal.  Nose: Rhinorrhea present. Right sinus exhibits no maxillary sinus tenderness and no frontal sinus tenderness. Left sinus exhibits no maxillary sinus tenderness and no frontal sinus tenderness.  Mouth/Throat: Mucous membranes are normal. Posterior oropharyngeal erythema present.  Eyes: Conjunctivae and lids are normal. Right eye exhibits no discharge. Left eye exhibits no discharge. No scleral icterus.  Cardiovascular: Normal rate and regular rhythm.   Pulmonary/Chest: Effort normal and breath sounds normal. No respiratory distress.  Abdominal: Normal appearance. There is no splenomegaly or hepatomegaly.  Musculoskeletal: Normal range of motion.  Neurological: She is alert and oriented to person, place, and time.  Skin: Skin is intact. No rash noted. No pallor.  Psychiatric: She has a normal mood and affect. Her behavior is normal. Judgment and thought content normal.        Assessment & Plan:   Problem List Items Addressed This Visit    None    Visit Diagnoses    Upper respiratory infection    -  Primary    Tessalon perles given for cough. Patient has Flonase at home. Encouraged supportive care of fluids  and salt water gargle.         Follow up plan: Return if symptoms worsen or fail to improve.

## 2016-03-17 NOTE — Patient Instructions (Signed)
Upper Respiratory Infection, Adult Most upper respiratory infections (URIs) are a viral infection of the air passages leading to the lungs. A URI affects the nose, throat, and upper air passages. The most common type of URI is nasopharyngitis and is typically referred to as "the common cold." URIs run their course and usually go away on their own. Most of the time, a URI does not require medical attention, but sometimes a bacterial infection in the upper airways can follow a viral infection. This is called a secondary infection. Sinus and middle ear infections are common types of secondary upper respiratory infections. Bacterial pneumonia can also complicate a URI. A URI can worsen asthma and chronic obstructive pulmonary disease (COPD). Sometimes, these complications can require emergency medical care and may be life threatening.  CAUSES Almost all URIs are caused by viruses. A virus is a type of germ and can spread from one person to another.  RISKS FACTORS You may be at risk for a URI if:   You smoke.   You have chronic heart or lung disease.  You have a weakened defense (immune) system.   You are very young or very old.   You have nasal allergies or asthma.  You work in crowded or poorly ventilated areas.  You work in health care facilities or schools. SIGNS AND SYMPTOMS  Symptoms typically develop 2-3 days after you come in contact with a cold virus. Most viral URIs last 7-10 days. However, viral URIs from the influenza virus (flu virus) can last 14-18 days and are typically more severe. Symptoms may include:   Runny or stuffy (congested) nose.   Sneezing.   Cough.   Sore throat.   Headache.   Fatigue.   Fever.   Loss of appetite.   Pain in your forehead, behind your eyes, and over your cheekbones (sinus pain).  Muscle aches.  DIAGNOSIS  Your health care provider may diagnose a URI by:  Physical exam.  Tests to check that your symptoms are not due to  another condition such as:  Strep throat.  Sinusitis.  Pneumonia.  Asthma. TREATMENT  A URI goes away on its own with time. It cannot be cured with medicines, but medicines may be prescribed or recommended to relieve symptoms. Medicines may help:  Reduce your fever.  Reduce your cough.  Relieve nasal congestion. HOME CARE INSTRUCTIONS   Take medicines only as directed by your health care provider.   Gargle warm saltwater or take cough drops to comfort your throat as directed by your health care provider.  Use a warm mist humidifier or inhale steam from a shower to increase air moisture. This may make it easier to breathe.  Drink enough fluid to keep your urine clear or pale yellow.   Eat soups and other clear broths and maintain good nutrition.   Rest as needed.   Return to work when your temperature has returned to normal or as your health care provider advises. You may need to stay home longer to avoid infecting others. You can also use a face mask and careful hand washing to prevent spread of the virus.  Increase the usage of your inhaler if you have asthma.   Do not use any tobacco products, including cigarettes, chewing tobacco, or electronic cigarettes. If you need help quitting, ask your health care provider. PREVENTION  The best way to protect yourself from getting a cold is to practice good hygiene.   Avoid oral or hand contact with people with cold   symptoms.   Wash your hands often if contact occurs.  There is no clear evidence that vitamin C, vitamin E, echinacea, or exercise reduces the chance of developing a cold. However, it is always recommended to get plenty of rest, exercise, and practice good nutrition.  SEEK MEDICAL CARE IF:   You are getting worse rather than better.   Your symptoms are not controlled by medicine.   You have chills.  You have worsening shortness of breath.  You have brown or red mucus.  You have yellow or brown nasal  discharge.  You have pain in your face, especially when you bend forward.  You have a fever.  You have swollen neck glands.  You have pain while swallowing.  You have white areas in the back of your throat. SEEK IMMEDIATE MEDICAL CARE IF:   You have severe or persistent:  Headache.  Ear pain.  Sinus pain.  Chest pain.  You have chronic lung disease and any of the following:  Wheezing.  Prolonged cough.  Coughing up blood.  A change in your usual mucus.  You have a stiff neck.  You have changes in your:  Vision.  Hearing.  Thinking.  Mood. MAKE SURE YOU:   Understand these instructions.  Will watch your condition.  Will get help right away if you are not doing well or get worse.   This information is not intended to replace advice given to you by your health care provider. Make sure you discuss any questions you have with your health care provider.   Document Released: 05/11/2001 Document Revised: 04/01/2015 Document Reviewed: 02/20/2014 Elsevier Interactive Patient Education 2016 [employer]  

## 2016-04-12 ENCOUNTER — Other Ambulatory Visit: Payer: Self-pay | Admitting: Family Medicine

## 2016-04-13 ENCOUNTER — Other Ambulatory Visit: Payer: Self-pay | Admitting: Surgery

## 2016-04-13 DIAGNOSIS — M7521 Bicipital tendinitis, right shoulder: Secondary | ICD-10-CM

## 2016-04-29 ENCOUNTER — Ambulatory Visit
Admission: RE | Admit: 2016-04-29 | Discharge: 2016-04-29 | Disposition: A | Discharge status: Home / Self Care | Payer: 59 | Source: Ambulatory Visit | Attending: Surgery | Admitting: Surgery

## 2016-04-29 DIAGNOSIS — M7521 Bicipital tendinitis, right shoulder: Secondary | ICD-10-CM | POA: Diagnosis present

## 2016-04-29 DIAGNOSIS — M75121 Complete rotator cuff tear or rupture of right shoulder, not specified as traumatic: Secondary | ICD-10-CM | POA: Diagnosis not present

## 2016-04-29 DIAGNOSIS — R938 Abnormal findings on diagnostic imaging of other specified body structures: Secondary | ICD-10-CM | POA: Diagnosis not present

## 2016-05-12 ENCOUNTER — Encounter: Payer: Self-pay | Admitting: *Deleted

## 2016-05-12 ENCOUNTER — Other Ambulatory Visit: Payer: Self-pay

## 2016-05-12 NOTE — Patient Instructions (Addendum)
  Your procedure is scheduled on: 05-13-16 Report to [facility] To find out your arrival time please call [phone] between 1PM - 3PM on 05-12-16  Remember: Instructions that are not followed completely may result in serious medical risk, up to and including death, or upon the discretion of your surgeon and anesthesiologist your surgery may need to be rescheduled.    _X___ 1. Do not eat food or drink liquids after midnight. No gum chewing or hard candies.     _X___ 2. No Alcohol for 24 hours before or after surgery.   ____ 3. Do Not Smoke For 24 Hours Prior to Your Surgery.   ____ 4. Bring all medications with you on the day of surgery if instructed.    _X___ 5. Notify your doctor if there is any change in your medical condition     (cold, fever, infections).       Do not wear jewelry, make-up, hairpins, clips or nail polish.  Do not wear lotions, powders, or perfumes. You may wear deodorant.  Do not shave 48 hours prior to surgery. Men may shave face and neck.  Do not bring valuables to the hospital.    [facility] is not responsible for any belongings or valuables.               Contacts, dentures or bridgework may not be worn into surgery.  Leave your suitcase in the car. After surgery it may be brought to your room.  For patients admitted to the hospital, discharge time is determined by your treatment team.   Patients discharged the day of surgery will not be allowed to drive home.   Please read over the following fact sheets that you were given:    _X___ Take these medicines the morning of surgery with A SIP OF WATER:    1. NEURONTIN  2  PAXIL  3. ALLEGRA  4. PRILOSEC  5. TAKE AN EXTRA PRILOSEC TONIGHT BEFORE BED  6.  ____ Fleet Enema (as directed)   ____ Use CHG Soap as directed  ____ Use inhalers on the day of surgery  _X___ Stop metformin 2 days prior to surgery-STOP XIGDUO NOW(PT LAST TOOK THIS TODAY ( 05-12-16) IN AM   _X___  Take 1/2 of usual insulin dose the night before surgery and none on the morning of surgery-TAKE HALF OF YOUR LEVEMIR TONIGHT AND NO INSULIN IN THE MORNING   _X___ Stop Coumadin/Plavix/aspirin-STOP EXCEDRIN MIGRAINE-LAST TOOK 05-10-16  _X___ Stop Anti-inflammatories-STOP IBUPROFEN-NO NSAIDS OR ASA PRODUCTS-TRAMADOL OK TO CONTINUE    ____ Stop supplements until after surgery.    ____ Bring C-Pap to the hospital.

## 2016-05-12 NOTE — Pre-Procedure Instructions (Signed)
TRIED TO GET PT TO COME IN FOR LABS AND AN EKG AND SHE SAID SHE WAS UNABLE TO DO SO-INFORMED PT THIS IS GOING TO HAVE TO BE DONE IN AM

## 2016-05-13 ENCOUNTER — Ambulatory Visit: Payer: 59 | Admitting: Certified Registered"

## 2016-05-13 ENCOUNTER — Encounter
Admission: RE | Disposition: A | Discharge status: Home / Self Care | Payer: Self-pay | Source: Ambulatory Visit | Attending: Surgery

## 2016-05-13 ENCOUNTER — Ambulatory Visit
Admission: RE | Admit: 2016-05-13 | Discharge: 2016-05-13 | Disposition: A | Discharge status: Home / Self Care | Payer: 59 | Source: Ambulatory Visit | Attending: Surgery | Admitting: Surgery

## 2016-05-13 DIAGNOSIS — Z86010 Personal history of colonic polyps: Secondary | ICD-10-CM | POA: Insufficient documentation

## 2016-05-13 DIAGNOSIS — M75101 Unspecified rotator cuff tear or rupture of right shoulder, not specified as traumatic: Secondary | ICD-10-CM | POA: Diagnosis present

## 2016-05-13 DIAGNOSIS — Z8249 Family history of ischemic heart disease and other diseases of the circulatory system: Secondary | ICD-10-CM | POA: Insufficient documentation

## 2016-05-13 DIAGNOSIS — Z79899 Other long term (current) drug therapy: Secondary | ICD-10-CM | POA: Insufficient documentation

## 2016-05-13 DIAGNOSIS — E785 Hyperlipidemia, unspecified: Secondary | ICD-10-CM | POA: Diagnosis not present

## 2016-05-13 DIAGNOSIS — Z9889 Other specified postprocedural states: Secondary | ICD-10-CM | POA: Insufficient documentation

## 2016-05-13 DIAGNOSIS — M7541 Impingement syndrome of right shoulder: Secondary | ICD-10-CM | POA: Diagnosis not present

## 2016-05-13 DIAGNOSIS — Z8262 Family history of osteoporosis: Secondary | ICD-10-CM | POA: Insufficient documentation

## 2016-05-13 DIAGNOSIS — Z803 Family history of malignant neoplasm of breast: Secondary | ICD-10-CM | POA: Diagnosis not present

## 2016-05-13 DIAGNOSIS — Z888 Allergy status to other drugs, medicaments and biological substances status: Secondary | ICD-10-CM | POA: Insufficient documentation

## 2016-05-13 DIAGNOSIS — Z800 Family history of malignant neoplasm of digestive organs: Secondary | ICD-10-CM | POA: Insufficient documentation

## 2016-05-13 DIAGNOSIS — Z6841 Body Mass Index (BMI) 40.0 and over, adult: Secondary | ICD-10-CM | POA: Diagnosis not present

## 2016-05-13 DIAGNOSIS — M75121 Complete rotator cuff tear or rupture of right shoulder, not specified as traumatic: Secondary | ICD-10-CM | POA: Diagnosis not present

## 2016-05-13 DIAGNOSIS — Z7951 Long term (current) use of inhaled steroids: Secondary | ICD-10-CM | POA: Insufficient documentation

## 2016-05-13 DIAGNOSIS — Z791 Long term (current) use of non-steroidal anti-inflammatories (NSAID): Secondary | ICD-10-CM | POA: Diagnosis not present

## 2016-05-13 DIAGNOSIS — Z794 Long term (current) use of insulin: Secondary | ICD-10-CM | POA: Diagnosis not present

## 2016-05-13 DIAGNOSIS — M170 Bilateral primary osteoarthritis of knee: Secondary | ICD-10-CM | POA: Insufficient documentation

## 2016-05-13 DIAGNOSIS — S43431A Superior glenoid labrum lesion of right shoulder, initial encounter: Secondary | ICD-10-CM | POA: Diagnosis not present

## 2016-05-13 DIAGNOSIS — X58XXXA Exposure to other specified factors, initial encounter: Secondary | ICD-10-CM | POA: Insufficient documentation

## 2016-05-13 DIAGNOSIS — Z7982 Long term (current) use of aspirin: Secondary | ICD-10-CM | POA: Insufficient documentation

## 2016-05-13 DIAGNOSIS — I10 Essential (primary) hypertension: Secondary | ICD-10-CM | POA: Insufficient documentation

## 2016-05-13 DIAGNOSIS — K2950 Unspecified chronic gastritis without bleeding: Secondary | ICD-10-CM | POA: Insufficient documentation

## 2016-05-13 DIAGNOSIS — E669 Obesity, unspecified: Secondary | ICD-10-CM | POA: Insufficient documentation

## 2016-05-13 DIAGNOSIS — Z833 Family history of diabetes mellitus: Secondary | ICD-10-CM | POA: Diagnosis not present

## 2016-05-13 DIAGNOSIS — E119 Type 2 diabetes mellitus without complications: Secondary | ICD-10-CM | POA: Insufficient documentation

## 2016-05-13 DIAGNOSIS — G43909 Migraine, unspecified, not intractable, without status migrainosus: Secondary | ICD-10-CM | POA: Diagnosis not present

## 2016-05-13 HISTORY — DX: Sleep apnea, unspecified: G47.30

## 2016-05-13 HISTORY — PX: SHOULDER ARTHROSCOPY WITH DEBRIDEMENT AND BICEP TENDON REPAIR: SHX5690

## 2016-05-13 HISTORY — DX: Nonscarring hair loss, unspecified: L65.9

## 2016-05-13 LAB — BASIC METABOLIC PANEL
ANION GAP: 10 (ref 5–15)
BUN: 20 mg/dL (ref 6–20)
CHLORIDE: 104 mmol/L (ref 101–111)
CO2: 24 mmol/L (ref 22–32)
Calcium: 9.1 mg/dL (ref 8.9–10.3)
Creatinine, Ser: 1.14 mg/dL — ABNORMAL HIGH (ref 0.44–1.00)
GFR calc Af Amer: >60 mL/min (ref >60)
GFR calc non Af Amer: 52 mL/min — ABNORMAL LOW (ref >60)
GLUCOSE: 248 mg/dL — AB (ref 65–99)
POTASSIUM: 4.2 mmol/L (ref 3.5–5.1)
Sodium: 138 mmol/L (ref 135–145)

## 2016-05-13 LAB — GLUCOSE, CAPILLARY
GLUCOSE-CAPILLARY: 241 mg/dL — AB (ref 65–99)
GLUCOSE-CAPILLARY: 282 mg/dL — AB (ref 65–99)

## 2016-05-13 SURGERY — SHOULDER ARTHROSCOPY WITH DEBRIDEMENT AND BICEP TENDON REPAIR
Anesthesia: Regional | Site: Shoulder | Laterality: Right | Wound class: Clean

## 2016-05-13 MED ORDER — LIDOCAINE HCL (CARDIAC) 20 MG/ML IV SOLN
INTRAVENOUS | Status: DC | PRN
  Administered 2016-05-13: 50 mg via INTRAVENOUS

## 2016-05-13 MED ORDER — PHENYLEPHRINE HCL 10 MG/ML IJ SOLN
INTRAMUSCULAR | Status: DC | PRN
  Administered 2016-05-13: 200 ug via INTRAVENOUS
  Administered 2016-05-13 (×3): 100 ug via INTRAVENOUS

## 2016-05-13 MED ORDER — ONDANSETRON HCL 4 MG/2ML IJ SOLN
INTRAMUSCULAR | Status: DC | PRN
  Administered 2016-05-13: 4 mg via INTRAVENOUS

## 2016-05-13 MED ORDER — FENTANYL CITRATE (PF) 100 MCG/2ML IJ SOLN
50.0000 ug | Freq: Once | INTRAMUSCULAR | Status: AC
  Administered 2016-05-13: 50 ug via INTRAVENOUS

## 2016-05-13 MED ORDER — SUCCINYLCHOLINE CHLORIDE 20 MG/ML IJ SOLN
INTRAMUSCULAR | Status: DC | PRN
  Administered 2016-05-13: 100 mg via INTRAVENOUS

## 2016-05-13 MED ORDER — POTASSIUM CHLORIDE IN NACL 20-0.9 MEQ/L-% IV SOLN: INTRAVENOUS | Status: DC

## 2016-05-13 MED ORDER — ROPIVACAINE HCL 5 MG/ML IJ SOLN
INTRAMUSCULAR | Status: AC
  Administered 2016-05-13: 30 mL via PERINEURAL
  Filled 2016-05-13: qty 20

## 2016-05-13 MED ORDER — OXYCODONE HCL 5 MG PO TABS: 5.0000 mg | ORAL_TABLET | ORAL | Status: DC | PRN

## 2016-05-13 MED ORDER — GLYCOPYRROLATE 0.2 MG/ML IJ SOLN
INTRAMUSCULAR | Status: DC | PRN
  Administered 2016-05-13: 0.2 mg via INTRAVENOUS

## 2016-05-13 MED ORDER — LIDOCAINE HCL (PF) 1 % IJ SOLN
INTRAMUSCULAR | Status: AC
  Administered 2016-05-13: 5 mL via SUBCUTANEOUS
  Filled 2016-05-13: qty 5

## 2016-05-13 MED ORDER — METOCLOPRAMIDE HCL 5 MG/ML IJ SOLN: 5.0000 mg | Freq: Three times a day (TID) | INTRAMUSCULAR | Status: DC | PRN

## 2016-05-13 MED ORDER — ONDANSETRON HCL 4 MG/2ML IJ SOLN: 4.0000 mg | Freq: Once | INTRAMUSCULAR | Status: DC | PRN

## 2016-05-13 MED ORDER — MIDAZOLAM HCL 5 MG/5ML IJ SOLN
INTRAMUSCULAR | Status: AC
  Administered 2016-05-13: 1 mg via INTRAVENOUS
  Filled 2016-05-13: qty 5

## 2016-05-13 MED ORDER — EPHEDRINE SULFATE 50 MG/ML IJ SOLN
INTRAMUSCULAR | Status: DC | PRN
  Administered 2016-05-13: 10 mg via INTRAVENOUS

## 2016-05-13 MED ORDER — PROPOFOL 10 MG/ML IV BOLUS
INTRAVENOUS | Status: DC | PRN
  Administered 2016-05-13: 200 mg via INTRAVENOUS

## 2016-05-13 MED ORDER — CEFAZOLIN SODIUM-DEXTROSE 2-4 GM/100ML-% IV SOLN
2.0000 g | Freq: Once | INTRAVENOUS | Status: AC
  Administered 2016-05-13: 2 g via INTRAVENOUS

## 2016-05-13 MED ORDER — KETAMINE HCL 50 MG/ML IJ SOLN
INTRAMUSCULAR | Status: DC | PRN
  Administered 2016-05-13: 25 mg via INTRAMUSCULAR
  Administered 2016-05-13: 25 mg via INTRAVENOUS

## 2016-05-13 MED ORDER — SODIUM CHLORIDE 0.9 % IV SOLN
10000.0000 ug | INTRAVENOUS | Status: DC | PRN
  Administered 2016-05-13: 30 ug/min via INTRAVENOUS

## 2016-05-13 MED ORDER — CEFAZOLIN SODIUM-DEXTROSE 2-4 GM/100ML-% IV SOLN
INTRAVENOUS | Status: AC
  Filled 2016-05-13: qty 100

## 2016-05-13 MED ORDER — EPINEPHRINE HCL 1 MG/ML IJ SOLN
INTRAMUSCULAR | Status: DC | PRN
  Administered 2016-05-13: 2 mL

## 2016-05-13 MED ORDER — FENTANYL CITRATE (PF) 100 MCG/2ML IJ SOLN
INTRAMUSCULAR | Status: DC | PRN
  Administered 2016-05-13 (×2): 50 ug via INTRAVENOUS

## 2016-05-13 MED ORDER — EPINEPHRINE HCL 1 MG/ML IJ SOLN
INTRAMUSCULAR | Status: AC
  Filled 2016-05-13: qty 2

## 2016-05-13 MED ORDER — MIDAZOLAM HCL 2 MG/2ML IJ SOLN
INTRAMUSCULAR | Status: DC | PRN
Start: 2016-05-13 — End: 2016-05-13
  Administered 2016-05-13: 1 mg via INTRAVENOUS

## 2016-05-13 MED ORDER — FENTANYL CITRATE (PF) 100 MCG/2ML IJ SOLN: 25.0000 ug | INTRAMUSCULAR | Status: DC | PRN

## 2016-05-13 MED ORDER — BUPIVACAINE-EPINEPHRINE 0.5% -1:200000 IJ SOLN
INTRAMUSCULAR | Status: DC | PRN
  Administered 2016-05-13: 30 mL

## 2016-05-13 MED ORDER — BUPIVACAINE-EPINEPHRINE (PF) 0.5% -1:200000 IJ SOLN
INTRAMUSCULAR | Status: AC
  Filled 2016-05-13: qty 30

## 2016-05-13 MED ORDER — ROPIVACAINE HCL 5 MG/ML IJ SOLN
INTRAMUSCULAR | Status: AC
  Filled 2016-05-13: qty 20

## 2016-05-13 MED ORDER — SODIUM CHLORIDE 0.9 % IV SOLN
INTRAVENOUS | Status: DC
  Administered 2016-05-13: 08:00:00 via INTRAVENOUS

## 2016-05-13 MED ORDER — FENTANYL CITRATE (PF) 100 MCG/2ML IJ SOLN
INTRAMUSCULAR | Status: AC
  Administered 2016-05-13: 50 ug via INTRAVENOUS
  Filled 2016-05-13: qty 2

## 2016-05-13 MED ORDER — DEXAMETHASONE SODIUM PHOSPHATE 10 MG/ML IJ SOLN
INTRAMUSCULAR | Status: DC | PRN
  Administered 2016-05-13: 5 mg via INTRAVENOUS

## 2016-05-13 MED ORDER — ONDANSETRON HCL 4 MG/2ML IJ SOLN: 4.0000 mg | Freq: Four times a day (QID) | INTRAMUSCULAR | Status: DC | PRN

## 2016-05-13 MED ORDER — METOCLOPRAMIDE HCL 10 MG PO TABS: 5.0000 mg | ORAL_TABLET | Freq: Three times a day (TID) | ORAL | Status: DC | PRN

## 2016-05-13 MED ORDER — MIDAZOLAM HCL 5 MG/5ML IJ SOLN
1.0000 mg | Freq: Once | INTRAMUSCULAR | Status: AC
  Administered 2016-05-13: 1 mg via INTRAVENOUS
  Filled 2016-05-13: qty 1

## 2016-05-13 MED ORDER — ONDANSETRON HCL 4 MG PO TABS: 4.0000 mg | ORAL_TABLET | Freq: Four times a day (QID) | ORAL | Status: DC | PRN

## 2016-05-13 SURGICAL SUPPLY — 45 items
ANCHOR JUGGERKNOT WTAP NDL 2.9 (Anchor) ×2 IMPLANT
ANCHOR SUT QUATTRO KNTLS 4.5 (Anchor) ×2 IMPLANT
BIT DRILL JUGRKNT W/NDL BIT2.9 (DRILL) ×1 IMPLANT
BLADE FULL RADIUS 3.5 (BLADE) ×2 IMPLANT
BLADE SHAVER 4.5X7 STR FR (MISCELLANEOUS) IMPLANT
BUR ACROMIONIZER 4.0 (BURR) ×2 IMPLANT
BUR BR 5.5 WIDE MOUTH (BURR) IMPLANT
CANNULA SHAVER 8MMX76MM (CANNULA) ×2 IMPLANT
CHLORAPREP W/TINT 26ML (MISCELLANEOUS) ×4 IMPLANT
COVER MAYO STAND STRL (DRAPES) ×2 IMPLANT
DRAPE IMP U-DRAPE 54X76 (DRAPES) ×4 IMPLANT
DRILL JUGGERKNOT W/NDL BIT 2.9 (DRILL) ×2
DRSG OPSITE POSTOP 4X8 (GAUZE/BANDAGES/DRESSINGS) ×2 IMPLANT
ELECT REM PT RETURN 9FT ADLT (ELECTROSURGICAL) ×2
ELECTRODE REM PT RTRN 9FT ADLT (ELECTROSURGICAL) ×1 IMPLANT
GAUZE PETRO XEROFOAM 1X8 (MISCELLANEOUS) ×2 IMPLANT
GAUZE SPONGE 4X4 12PLY STRL (GAUZE/BANDAGES/DRESSINGS) ×2 IMPLANT
GLOVE BIO SURGEON STRL SZ7.5 (GLOVE) ×4 IMPLANT
GLOVE BIO SURGEON STRL SZ8 (GLOVE) ×4 IMPLANT
GLOVE BIOGEL PI IND STRL 8 (GLOVE) ×1 IMPLANT
GLOVE BIOGEL PI INDICATOR 8 (GLOVE) ×1
GLOVE INDICATOR 8.0 STRL GRN (GLOVE) ×2 IMPLANT
GOWN STRL REUS W/ TWL LRG LVL3 (GOWN DISPOSABLE) ×2 IMPLANT
GOWN STRL REUS W/ TWL XL LVL3 (GOWN DISPOSABLE) ×1 IMPLANT
GOWN STRL REUS W/TWL LRG LVL3 (GOWN DISPOSABLE) ×2
GOWN STRL REUS W/TWL XL LVL3 (GOWN DISPOSABLE) ×1
GRASPER SUT 15 45D LOW PRO (SUTURE) IMPLANT
IV LACTATED RINGER IRRG 3000ML (IV SOLUTION) ×2
IV LR IRRIG 3000ML ARTHROMATIC (IV SOLUTION) ×2 IMPLANT
MANIFOLD NEPTUNE II (INSTRUMENTS) ×2 IMPLANT
MASK FACE SPIDER DISP (MASK) ×2 IMPLANT
MAT BLUE FLOOR 46X72 FLO (MISCELLANEOUS) ×2 IMPLANT
NEEDLE REVERSE CUT 1/2 CRC (NEEDLE) IMPLANT
PACK ARTHROSCOPY SHOULDER (MISCELLANEOUS) ×2 IMPLANT
SLING ARM LRG DEEP (SOFTGOODS) IMPLANT
SLING ULTRA II LG (MISCELLANEOUS) ×2 IMPLANT
STAPLER SKIN PROX 35W (STAPLE) ×2 IMPLANT
STRAP SAFETY BODY (MISCELLANEOUS) ×2 IMPLANT
SUT ETHIBOND 0 MO6 C/R (SUTURE) ×2 IMPLANT
SUT VIC AB 2-0 CT1 27 (SUTURE) ×2
SUT VIC AB 2-0 CT1 TAPERPNT 27 (SUTURE) ×2 IMPLANT
TAPE MICROFOAM 4IN (TAPE) ×2 IMPLANT
TUBING ARTHRO INFLOW-ONLY STRL (TUBING) ×2 IMPLANT
TUBING CONNECTING 10 (TUBING) ×2 IMPLANT
WAND HAND CNTRL MULTIVAC 90 (MISCELLANEOUS) ×2 IMPLANT

## 2016-05-13 NOTE — Op Note (Signed)
05/13/2016  10:45 AM  Patient:   Sierra Copeland  Pre-Op Diagnosis:   Impingement/tendinopathy with near full-thickness rotator cuff tear, right shoulder  Postoperative diagnosis: Impingement/tendinopathy with near full-thickness rotator cuff tear and degenerative labral tear, and biceps tendinopathy, right shoulder.  Procedure: Extensive arthroscopic debridement, arthroscopic subacromial decompression, mini-open rotator cuff repair, and mini-open biceps tenodesis, right shoulder.  Anesthesia: General endotracheal with interscalene block placed preoperatively by the anesthesiologist.  Surgeon:   Pascal Lux, MD  Assistant:   Cameron Proud, PA-C; Alferd Apa, PA-S  Findings: As above. There was a near full-thickness rotator cuff tear involving the anterior two thirds of the supraspinatus tendon without retraction. The subscapularis tendon was in satisfactory condition as were the more posterior portions of the rotator cuff. There was extensive tendinopathy changes of the biceps tendon along with Type 1 degenerative tearing of the superior portion of the labrum without frank detachment from the glenoid. The articular surfaces of both the glenoid and humerus were in satisfactory condition.  Complications: None  Fluids:   500 cc  Estimated blood loss: 5 cc  Tourniquet time: None  Drains: None  Closure: Staples   Brief clinical note: The patient is a 60 year old female with a several month history of right shoulder pain. The patient's symptoms have progressed despite medications, activity modification, etc. The patient's history and examination are consistent with impingement/tendinopathy with a rotator cuff tear. These findings were confirmed by MRI scan. The patient presents at this time for definitive management of these shoulder symptoms.  Procedure: The patient underwent placement of an interscalene block by the anesthesiologist in the preoperative  holding area before she was brought into the operating room and lain in the supine position. The patient then underwent general endotracheal intubation and anesthesia before being repositioned in the beach chair position using the beach chair positioner. The right shoulder and upper extremity were prepped with ChloraPrep solution before being draped sterilely. Preoperative antibiotics were administered. A timeout was performed to confirm the proper surgical site before the expected portal sites and incision site were injected with 0.5% Sensorcaine with epinephrine. A posterior portal was created and the glenohumeral joint thoroughly inspected with the findings as described above. An anterior portal was created using an outside-in technique. The labrum and rotator cuff were further probed, again confirming the above-noted findings. The areas of degenerative tearing of the labrum, the biceps tendon, and the rotator cuff all were debrided back to stable margins using the full-radius resector. In addition, areas of extensive synovitis antero-superiorly and postero-superiorly also were debrided back to stable margins using the full-radius resector. The ArthroCare wand was inserted and used to release the biceps tendon from its attachment to the labrum. The ArthroCare wand then was used to obtain hemostasis as well as to "anneal" the labrum superiorly and anteriorly. The instruments were removed from the joint after suctioning the excess fluid.  The camera was repositioned through the posterior portal into the subacromial space. A separate lateral portal was created using an outside-in technique. The 3.5 mm full-radius resector was introduced and used to perform a subtotal bursectomy. The ArthroCare wand was then inserted and used to remove the periosteal tissue off the undersurface of the anterior third of the acromion as well as to recess the coracoacromial ligament from its attachment along the anterior and lateral  margins of the acromion. The 4.0 mm acromionizing bur was introduced and used to complete the decompression by removing the undersurface of the anterior third of the acromion.  The full radius resector was reintroduced to remove any residual bony debris before the ArthroCare wand was reintroduced to obtain hemostasis. The instruments were then removed from the subacromial space after suctioning the excess fluid.  An approximately 4-5 cm incision was made over the anterolateral aspect of the shoulder beginning at the anterolateral corner of the acromion and extending distally in line with the bicipital groove. This incision was carried down through the subcutaneous tissues to expose the deltoid fascia. The raphae between the anterior and middle thirds was identified and this plane developed to provide access into the subacromial space. Additional bursal tissues were debrided sharply using Metzenbaum scissors. The near full-thickness articular surface rotator cuff tear was readily identified by palpation. The tear was completed and the frayed margins were debrided sharply with a #15 blade before the exposed greater tuberosity was roughened with a rongeur. The tear was repaired using a single Biomet 2.9 mm JuggerKnot anchor. These four sutures were then brought back laterally and secured using a [place] QuattroLink to create a two-layer closure. Several additional #0 Ethibond interrupted sutures were placed in a side-to-side fashion to reinforce the repair. An apparent watertight closure was obtained.  The bicipital groove was identified by palpation. Given the patient's size and level of activity, it was felt that she would benefit from a soft tissue biceps tenodesis. This was performed using several #0 Ethibond interrupted sutures placed through the biceps tendon and bicipital sheath to stabilize and secure the tendon.  The wound was copiously irrigated with sterile saline solution before the deltoid raphae was  reapproximated using 2-0 Vicryl interrupted sutures. The subcutaneous tissues were closed in several layers using 2-0 Vicryl interrupted sutures before the skin was closed using staples. The portal sites also were closed using staples. A sterile bulky dressing was applied to the shoulder before the arm was placed into a shoulder immobilizer. The patient was then awakened, extubated, and returned to the recovery room in satisfactory condition after tolerating the procedure well.

## 2016-05-13 NOTE — Discharge Instructions (Addendum)
Keep dressing dry and intact.  May shower after dressing changed on post-op day #4 (Monday).  Cover staples with Band-Aids after drying off. Apply ice frequently to shoulder. Keep shoulder immobilizer on at all times except may remove for bathing purposes. Follow-up in 10-14 days or as scheduled.AMBULATORY SURGERY  DISCHARGE INSTRUCTIONS   1) The drugs that you were given will stay in your system until tomorrow so for the next 24 hours you should not:  A) Drive an automobile B) Make any legal decisions C) Drink any alcoholic beverage   2) You may resume regular meals tomorrow.  Today it is better to start with liquids and gradually work up to solid foods.  You may eat anything you prefer, but it is better to start with liquids, then soup and crackers, and gradually work up to solid foods.   3) Please notify your doctor immediately if you have any unusual bleeding, trouble breathing, redness and pain at the surgery site, drainage, fever, or pain not relieved by medication.    4) Additional Instructions:        Please contact your physician with any problems or Same Day Surgery at [phone], Monday through Friday 6 am to 4 pm, or [place] [facility] number at [phone].

## 2016-05-13 NOTE — H&P (Signed)
Paper H&P to be scanned into permanent record. H&P reviewed. No changes. 

## 2016-05-13 NOTE — OR Nursing (Signed)
Anesthesia reviewed EKG 

## 2016-05-13 NOTE — Transfer of Care (Signed)
Immediate Anesthesia Transfer of Care Note  Patient: Sierra Copeland  Procedure(s) Performed: Procedure(s): Arthroscopic debridement, open decompression, rotator cuff repair, tenodesis,right shoulder. (Right)  Patient Location: PACU  Anesthesia Type:GA combined with regional for post-op pain  Level of Consciousness: awake and alert   Airway & Oxygen Therapy: Patient Spontanous Breathing and Patient connected to face mask oxygen  Post-op Assessment: Report given to RN and Post -op Vital signs reviewed and stable  Post vital signs: Reviewed  Last Vitals:  Filed Vitals:   05/13/16 0900 05/13/16 1106  BP: 126/59 149/74  Pulse: 89 103  Temp:  36.3 C  Resp: 24 16    Last Pain:  Filed Vitals:   05/13/16 1107  PainSc: 7          Complications: No apparent anesthesia complications

## 2016-05-13 NOTE — Anesthesia Postprocedure Evaluation (Signed)
Anesthesia Post Note  Patient: Sierra Copeland  Procedure(s) Performed: Procedure(s) (LRB): Arthroscopic debridement, open decompression, rotator cuff repair, tenodesis,right shoulder. (Right)  Patient location during evaluation: PACU Anesthesia Type: General Level of consciousness: awake and alert Pain management: pain level controlled Vital Signs Assessment: post-procedure vital signs reviewed and stable Respiratory status: spontaneous breathing, nonlabored ventilation, respiratory function stable and patient connected to nasal cannula oxygen Cardiovascular status: blood pressure returned to baseline and stable Postop Assessment: no signs of nausea or vomiting Anesthetic complications: no    Last Vitals:  Filed Vitals:   05/13/16 1216 05/13/16 1308  BP: 138/65 134/71  Pulse: 93 111  Temp: 36.8 C   Resp: 16 16    Last Pain:  Filed Vitals:   05/13/16 1309  PainSc: 1                  Martha Clan

## 2016-05-13 NOTE — Anesthesia Preprocedure Evaluation (Signed)
Anesthesia Evaluation  Patient identified by MRN, date of birth, ID band Patient awake    Reviewed: Allergy & Precautions, H&P , NPO status , Patient's Chart, lab work & pertinent test results, reviewed documented beta blocker date and time   History of Anesthesia Complications (+) PONV and history of anesthetic complications  Airway Mallampati: III  TM Distance: >3 FB Neck ROM: full    Dental no notable dental hx. (+) Caps, Missing   Pulmonary neg shortness of breath, sleep apnea , neg COPD, neg recent URI, former smoker,    Pulmonary exam normal breath sounds clear to auscultation       Cardiovascular Exercise Tolerance: Good hypertension, (-) angina(-) CAD, (-) Past MI, (-) Cardiac Stents and (-) CABG Normal cardiovascular exam(-) dysrhythmias (-) Valvular Problems/Murmurs Rhythm:regular Rate:Normal     Neuro/Psych PSYCHIATRIC DISORDERS (Depression) negative neurological ROS     GI/Hepatic Neg liver ROS, GERD  ,  Endo/Other  diabetes, Poorly Controlled, Insulin DependentMorbid obesity  Renal/GU negative Renal ROS  negative genitourinary   Musculoskeletal   Abdominal   Peds  Hematology negative hematology ROS (+)   Anesthesia Other Findings Past Medical History:   Diabetes mellitus without complication (HCC)                 Hypertension                                                 Hyperlipemia                                                 Wears glasses                                                Arthritis                                                    GERD (gastroesophageal reflux disease)                         Comment:occ-no meds   Depression                                                   PONV (postoperative nausea and vomiting)                     Collagenous colitis                                          Osteoarthritis  Comment:seen by Rheum,  not enoug evidence to support RA   Rosacea                                                      Seborrheic dermatitis                                        Abnormal mammogram                                           Pinched nerve                                                Back pain                                                      Comment:spinal stenosis and neck bone spur   Headache                                                       Comment:migraines   Car sickness                                                 Sleep apnea                                                  Hair loss                                                      Comment:TAKING THYROTAIN   Reproductive/Obstetrics negative OB ROS                             Anesthesia Physical Anesthesia Plan  ASA: III  Anesthesia Plan: General and Regional   Post-op Pain Management: GA combined w/ Regional for post-op pain   Induction:   Airway Management Planned:   Additional Equipment:   Intra-op Plan:   Post-operative Plan:   Informed Consent: I have reviewed the patients History and Physical, chart, labs and discussed the procedure including the risks, benefits and alternatives for the proposed anesthesia with the patient or authorized representative who has indicated his/her understanding and acceptance.  Dental Advisory Given  Plan Discussed with: Anesthesiologist, CRNA and Surgeon  Anesthesia Plan Comments:         Anesthesia Quick Evaluation

## 2016-05-13 NOTE — Anesthesia Procedure Notes (Addendum)
Anesthesia Regional Block:  Interscalene brachial plexus block  Pre-Anesthetic Checklist: ,, timeout performed, Correct Patient, Correct Site, Correct Laterality, Correct Procedure, Correct Position, site marked, Risks and benefits discussed,  Surgical consent,  Pre-op evaluation,  At surgeon's request and post-op pain management  Laterality: Right and Upper  Prep: chloraprep       Needles:  Injection technique: Single-shot  Needle Type: Stimiplex     Needle Length: 10cm 10 cm Needle Gauge: 22 and 22 G    Additional Needles:  Procedures: ultrasound guided (picture in chart) Interscalene brachial plexus block Narrative:  Start time: 05/13/2016 8:45 AM End time: 05/13/2016 8:50 AM Injection made incrementally with aspirations every 5 mL.  Performed by: Personally  Anesthesiologist: Martha Clan  Additional Notes: Functioning IV was confirmed and monitors were applied.  A 27mm 22ga Stimuplex needle was used. Sterile prep and drape,hand hygiene and sterile gloves were used.  Negative aspiration and negative test dose prior to incremental administration of local anesthetic. The patient tolerated the procedure well.      Procedure Name: Intubation Performed by: Rolla Plate Pre-anesthesia Checklist: Patient identified, Patient being monitored, Timeout performed, Emergency Drugs available and Suction available Patient Re-evaluated:Patient Re-evaluated prior to inductionOxygen Delivery Method: Circle system utilized Preoxygenation: Pre-oxygenation with 100% oxygen Intubation Type: IV induction and Rapid sequence Ventilation: Mask ventilation without difficulty Laryngoscope Size: Miller and 2 Grade View: Grade I Tube type: Oral Tube size: 7.0 mm Number of attempts: 1 Airway Equipment and Method: Stylet Placement Confirmation: ETT inserted through vocal cords under direct vision,  positive ETCO2 and breath sounds checked- equal and bilateral Secured at: 21 cm Tube secured  with: Tape Dental Injury: Teeth and Oropharynx as per pre-operative assessment

## 2016-05-21 ENCOUNTER — Other Ambulatory Visit: Payer: Self-pay | Admitting: Family Medicine

## 2016-05-21 NOTE — Telephone Encounter (Signed)
Per the request of Dr. Enid Derry, I contacted this patient regarding the message below but before I was able to ask her about the medication she stated she was going to cancel her appt here and was going to stay with [facility].

## 2016-05-21 NOTE — Telephone Encounter (Signed)
Thank you; routing to [place] provider

## 2016-05-21 NOTE — Telephone Encounter (Signed)
Please find out if patient is seeing me or Dr. Wynetta Emery for the refill; the refill will go to whichever provider is going to see her

## 2016-05-24 ENCOUNTER — Telehealth: Payer: Self-pay | Admitting: Family Medicine

## 2016-05-24 NOTE — Telephone Encounter (Signed)
Rx is on file with [facility], patient to call and have released.  She understands and will call

## 2016-05-24 NOTE — Telephone Encounter (Signed)
Patient is scheduled to see you on 06/04/16

## 2016-05-24 NOTE — Telephone Encounter (Signed)
Pt called needs a refill on Lisinopril. Pharm is [facility]. Thanks.

## 2016-05-24 NOTE — Telephone Encounter (Signed)
Sierra Copeland gave her a 90 day supply 04/12/16, she shouldn't be due until after she sees me.

## 2016-06-03 ENCOUNTER — Ambulatory Visit: Payer: 59 | Admitting: Family Medicine

## 2016-06-04 ENCOUNTER — Ambulatory Visit (INDEPENDENT_AMBULATORY_CARE_PROVIDER_SITE_OTHER): Payer: 59 | Admitting: Family Medicine

## 2016-06-04 ENCOUNTER — Other Ambulatory Visit: Payer: Self-pay | Admitting: Family Medicine

## 2016-06-04 ENCOUNTER — Other Ambulatory Visit: Payer: Self-pay

## 2016-06-04 ENCOUNTER — Encounter: Payer: Self-pay | Admitting: Family Medicine

## 2016-06-04 VITALS — BP 137/82 | HR 90 | Temp 98.6°F | Ht 61.0 in | Wt 226.6 lb

## 2016-06-04 DIAGNOSIS — E1165 Type 2 diabetes mellitus with hyperglycemia: Secondary | ICD-10-CM | POA: Diagnosis not present

## 2016-06-04 DIAGNOSIS — Z794 Long term (current) use of insulin: Principal | ICD-10-CM

## 2016-06-04 DIAGNOSIS — F329 Major depressive disorder, single episode, unspecified: Secondary | ICD-10-CM

## 2016-06-04 DIAGNOSIS — F32A Depression, unspecified: Secondary | ICD-10-CM

## 2016-06-04 DIAGNOSIS — I10 Essential (primary) hypertension: Secondary | ICD-10-CM | POA: Diagnosis not present

## 2016-06-04 DIAGNOSIS — E7800 Pure hypercholesterolemia, unspecified: Secondary | ICD-10-CM | POA: Diagnosis not present

## 2016-06-04 LAB — HEMOGLOBIN A1C: Hemoglobin A1C: 7.8

## 2016-06-04 MED ORDER — PAROXETINE HCL 10 MG PO TABS: ORAL_TABLET | ORAL | Status: DC

## 2016-06-04 MED ORDER — SIMVASTATIN 40 MG PO TABS: ORAL_TABLET | ORAL | Status: DC

## 2016-06-04 MED ORDER — LISINOPRIL 20 MG PO TABS: 20.0000 mg | ORAL_TABLET | Freq: Every day | ORAL | Status: DC

## 2016-06-04 NOTE — Assessment & Plan Note (Signed)
Improving on current regimen. Down to 7.8! Continue to follow with Dr. Gabriel Carina. Call with any concerns.

## 2016-06-04 NOTE — Assessment & Plan Note (Signed)
Under good control today. Continue current regimen. Continue to monitor. Checking labs today.

## 2016-06-04 NOTE — Assessment & Plan Note (Signed)
Continue weight loss! Congratulated patient on 6 pound weight loss! Continue to monitor.

## 2016-06-04 NOTE — Progress Notes (Signed)
BP 137/82 mmHg  Pulse 90  Temp(Src) 98.6 F (37 C)  Ht 5\' 1"  (1.549 m)  Wt 226 lb 9.6 oz (102.785 kg)  BMI 42.84 kg/m2  SpO2 96%  LMP 03/17/2013 (Approximate)   Subjective:    Patient ID: Sierra Copeland, female    DOB: 1955/12/02, 60 y.o.   MRN: [id number]  HPI: Sierra Copeland is a 60 y.o. female  Chief Complaint  Patient presents with  . Hypertension  . Diabetes  . Hyperlipidemia   DIABETES- followed by endocrinology, Dr. Gabriel Carina, last A1c 8.3 in May Hypoglycemic episodes:no Polydipsia/polyuria: no Visual disturbance: no Chest pain: no Paresthesias: no Glucose Monitoring: yes Taking Insulin?: yes Blood Pressure Monitoring: a few times a month Retinal Examination: Up to Date Foot Exam: Up to Date Diabetic Education: Completed Pneumovax: Up to Date Influenza: Up to Date Aspirin: no  HYPERTENSION / [place] Satisfied with current treatment? yes Duration of hypertension: chronic BP monitoring frequency: a few times a month BP range: 120s/70s BP medication side effects: no Duration of hyperlipidemia: chronic Cholesterol medication side effects: no Cholesterol supplements: none Medication compliance: excellent compliance Aspirin: no Recent stressors: no Recurrent headaches: no Visual changes: no Palpitations: no Dyspnea: no Chest pain: no Lower extremity edema: no Dizzy/lightheaded: no  DEPRESSION Mood status: exacerbated Satisfied with current treatment?: no Symptom severity: mild  Duration of current treatment : chronic Side effects: no Medication compliance: excellent compliance Psychotherapy/counseling: no  Previous psychiatric medications: paxil Depressed mood: yes Anxious mood: no Anhedonia: no Significant weight loss or gain: no Insomnia: yes hard to stay asleep Fatigue: no Feelings of worthlessness or guilt: no Impaired concentration/indecisiveness: yes Suicidal ideations: no Hopelessness: no Crying spells: no Depression screen PHQ 2/9  09/03/2015  Decreased Interest 0  Down, Depressed, Hopeless 0  PHQ - 2 Score 0    Relevant past medical, surgical, family and social history reviewed and updated as indicated. Interim medical history since our last visit reviewed. Allergies and medications reviewed and updated.  Review of Systems  Constitutional: Negative.   Respiratory: Negative.   Cardiovascular: Negative.   Musculoskeletal: Positive for arthralgias.  Psychiatric/Behavioral: Positive for sleep disturbance and dysphoric mood. Negative for suicidal ideas, hallucinations, behavioral problems, confusion, self-injury, decreased concentration and agitation. The patient is not nervous/anxious and is not hyperactive.     Per HPI unless specifically indicated above     Objective:    BP 137/82 mmHg  Pulse 90  Temp(Src) 98.6 F (37 C)  Ht 5\' 1"  (1.549 m)  Wt 226 lb 9.6 oz (102.785 kg)  BMI 42.84 kg/m2  SpO2 96%  LMP 03/17/2013 (Approximate)  Wt Readings from Last 3 Encounters:  06/04/16 226 lb 9.6 oz (102.785 kg)  05/13/16 230 lb (104.327 kg)  03/17/16 231 lb 9.6 oz (105.053 kg)    Physical Exam  Constitutional: She is oriented to person, place, and time. She appears well-developed and well-nourished. No distress.  Morbidly obese  HENT:  Head: Normocephalic and atraumatic.  Right Ear: Hearing normal.  Left Ear: Hearing normal.  Nose: Nose normal.  Eyes: Conjunctivae and lids are normal. Right eye exhibits no discharge. Left eye exhibits no discharge. No scleral icterus.  Cardiovascular: Normal rate, regular rhythm, normal heart sounds and intact distal pulses.  Exam reveals no gallop and no friction rub.   No murmur heard. Pulmonary/Chest: Effort normal and breath sounds normal. No respiratory distress. She has no wheezes. She has no rales. She exhibits no tenderness.  Musculoskeletal: Normal range of motion.  R  arm in immobilzer  Neurological: She is alert and oriented to person, place, and time.  Skin: Skin  is warm, dry and intact. No rash noted. She is not diaphoretic. No erythema. No pallor.  Psychiatric: She has a normal mood and affect. Her speech is normal and behavior is normal. Judgment and thought content normal. Cognition and memory are normal.  Nursing note and vitals reviewed.   Results for orders placed or performed during the hospital encounter of [id number]  Basic metabolic panel  Result Value Ref Range   Sodium 138 135 - 145 mmol/L   Potassium 4.2 3.5 - 5.1 mmol/L   Chloride 104 101 - 111 mmol/L   CO2 24 22 - 32 mmol/L   Glucose, Bld 248 (H) 65 - 99 mg/dL   BUN 20 6 - 20 mg/dL   Creatinine, Ser 1.14 (H) 0.44 - 1.00 mg/dL   Calcium 9.1 8.9 - 10.3 mg/dL   GFR calc non Af Amer 52 (L) >60 mL/min   GFR calc Af Amer >60 >60 mL/min   Anion gap 10 5 - 15  Glucose, capillary  Result Value Ref Range   Glucose-Capillary 241 (H) 65 - 99 mg/dL   Comment 1 Document in Chart   Glucose, capillary  Result Value Ref Range   Glucose-Capillary 282 (H) 65 - 99 mg/dL      Assessment & Plan:   Problem List Items Addressed This Visit      Cardiovascular and Mediastinum   Essential hypertension, benign    Under good control today. Continue current regimen. Continue to monitor. Checking labs today.      Relevant Medications   lisinopril (PRINIVIL,ZESTRIL) 20 MG tablet   simvastatin (ZOCOR) 40 MG tablet   Other Relevant Orders   UA/M w/rflx Culture, Routine   Microalbumin, Urine Waived     Endocrine   Type II diabetes mellitus, uncontrolled ([place])    Improving on current regimen. Down to 7.8! Continue to follow with Dr. Gabriel Carina. Call with any concerns.       Relevant Medications   lisinopril (PRINIVIL,ZESTRIL) 20 MG tablet   simvastatin (ZOCOR) 40 MG tablet   Other Relevant Orders   UA/M w/rflx Culture, Routine   Microalbumin, Urine Waived     Other   Hypercholesteremia    Under good control. Continue current regimen. Continue to monitor. Call with any concerns.        Relevant Medications   lisinopril (PRINIVIL,ZESTRIL) 20 MG tablet   simvastatin (ZOCOR) 40 MG tablet   Morbid obesity (H[facility])    Continue weight loss! Congratulated patient on 6 pound weight loss! Continue to monitor.       Depression - Primary    Not under great control. Will increase her paxil to 20mg  and check back in in 4-6 weeks. Call with any concerns.       Relevant Medications   PARoxetine (PAXIL) 10 MG tablet       Follow up plan: Return 4-6 weeks, follow up mood.

## 2016-06-04 NOTE — Assessment & Plan Note (Signed)
Not under great control. Will increase her paxil to 20mg  and check back in in 4-6 weeks. Call with any concerns.

## 2016-06-04 NOTE — Assessment & Plan Note (Signed)
Under good control. Continue current regimen. Continue to monitor. Call with any concerns. 

## 2016-06-05 LAB — UA/M W/RFLX CULTURE, ROUTINE
Bilirubin, UA: NEGATIVE
NITRITE UA: NEGATIVE
PH UA: 5 (ref 5.0–7.5)
PROTEIN UA: NEGATIVE
Specific Gravity, UA: 1.015 (ref 1.005–1.030)
UUROB: 0.2 mg/dL (ref 0.2–1.0)

## 2016-06-05 LAB — CBC WITH DIFFERENTIAL/PLATELET
BASOS ABS: 0 10*3/uL (ref 0.0–0.2)
Basos: 1 %
EOS (ABSOLUTE): 0.3 10*3/uL (ref 0.0–0.4)
EOS: 5 %
HEMOGLOBIN: 14.7 g/dL (ref 11.1–15.9)
Hematocrit: 43.5 % (ref 34.0–46.6)
IMMATURE GRANS (ABS): 0 10*3/uL (ref 0.0–0.1)
IMMATURE GRANULOCYTES: 0 %
LYMPHS: 34 %
Lymphocytes Absolute: 2.1 10*3/uL (ref 0.7–3.1)
MCH: 28.6 pg (ref 26.6–33.0)
MCHC: 33.8 g/dL (ref 31.5–35.7)
MCV: 85 fL (ref 79–97)
Monocytes Absolute: 0.4 10*3/uL (ref 0.1–0.9)
Monocytes: 6 %
NEUTROS PCT: 54 %
Neutrophils Absolute: 3.4 10*3/uL (ref 1.4–7.0)
Platelets: 277 10*3/uL (ref 150–379)
RBC: 5.14 x10E6/uL (ref 3.77–5.28)
RDW: 15.1 % (ref 12.3–15.4)
WBC: 6.2 10*3/uL (ref 3.4–10.8)

## 2016-06-05 LAB — URINE CULTURE, REFLEX

## 2016-06-05 LAB — COMPREHENSIVE METABOLIC PANEL
ALBUMIN: 4 g/dL (ref 3.5–5.5)
ALK PHOS: 50 IU/L (ref 39–117)
ALT: 19 IU/L (ref 0–32)
AST: 14 IU/L (ref 0–40)
Albumin/Globulin Ratio: 1.5 (ref 1.2–2.2)
BUN / CREAT RATIO: 21 (ref 9–23)
BUN: 20 mg/dL (ref 6–24)
Bilirubin Total: 0.4 mg/dL (ref 0.0–1.2)
CALCIUM: 9.9 mg/dL (ref 8.7–10.2)
CO2: 22 mmol/L (ref 18–29)
CREATININE: 0.96 mg/dL (ref 0.57–1.00)
Chloride: 97 mmol/L (ref 96–106)
GFR calc Af Amer: 75 mL/min/{1.73_m2} (ref >59)
GFR, EST NON AFRICAN AMERICAN: 65 mL/min/{1.73_m2} (ref >59)
GLUCOSE: 181 mg/dL — AB (ref 65–99)
Globulin, Total: 2.6 g/dL (ref 1.5–4.5)
Potassium: 4.4 mmol/L (ref 3.5–5.2)
Sodium: 140 mmol/L (ref 134–144)
TOTAL PROTEIN: 6.6 g/dL (ref 6.0–8.5)

## 2016-06-05 LAB — BAYER DCA HB A1C WAIVED: HB A1C: 7.8 % — AB (ref <7.0)

## 2016-06-05 LAB — TSH: TSH: 1.32 u[IU]/mL (ref 0.450–4.500)

## 2016-06-05 LAB — MICROALBUMIN, URINE WAIVED
Creatinine, Urine Waived: 100 mg/dL (ref 10–300)
Microalb, Ur Waived: 30 mg/L — ABNORMAL HIGH (ref 0–19)

## 2016-06-05 LAB — LIPID PANEL PICCOLO, WAIVED
Chol/HDL Ratio Piccolo,Waive: 2.3 mg/dL
Cholesterol Piccolo, Waived: 151 mg/dL (ref <200)
HDL CHOL PICCOLO, WAIVED: 65 mg/dL (ref >59)
LDL Chol Calc Piccolo Waived: 56 mg/dL (ref <100)
TRIGLYCERIDES PICCOLO,WAIVED: 147 mg/dL (ref <150)
VLDL CHOL CALC PICCOLO,WAIVE: 29 mg/dL (ref <30)

## 2016-06-05 LAB — MICROSCOPIC EXAMINATION

## 2016-06-07 ENCOUNTER — Encounter: Payer: Self-pay | Admitting: Family Medicine

## 2016-06-16 ENCOUNTER — Telehealth: Payer: Self-pay | Admitting: Family Medicine

## 2016-06-16 MED ORDER — PAROXETINE HCL 20 MG PO TABS: 20.0000 mg | ORAL_TABLET | Freq: Every day | ORAL | Status: DC

## 2016-06-16 NOTE — Telephone Encounter (Signed)
20 mg paroxetine sent to [facility]

## 2016-06-16 NOTE — Telephone Encounter (Signed)
PARoxetine (PAXIL) 10 MG tablet Pharmacy: [facility]  Patient called stating that Dr. Wynetta Emery send in the wrong doze. It was suppose to written for 20 mg not 10 mg.

## 2016-06-16 NOTE — Telephone Encounter (Signed)
Forward to provider

## 2016-07-09 ENCOUNTER — Ambulatory Visit: Payer: 59 | Admitting: Family Medicine

## 2016-07-14 ENCOUNTER — Ambulatory Visit (INDEPENDENT_AMBULATORY_CARE_PROVIDER_SITE_OTHER): Payer: 59 | Admitting: Family Medicine

## 2016-07-14 ENCOUNTER — Encounter: Payer: Self-pay | Admitting: Family Medicine

## 2016-07-14 DIAGNOSIS — F32A Depression, unspecified: Secondary | ICD-10-CM

## 2016-07-14 DIAGNOSIS — F329 Major depressive disorder, single episode, unspecified: Secondary | ICD-10-CM | POA: Diagnosis not present

## 2016-07-14 MED ORDER — PAROXETINE HCL 20 MG PO TABS: 20.0000 mg | ORAL_TABLET | Freq: Every day | ORAL | 3 refills | Status: DC

## 2016-07-14 NOTE — Progress Notes (Signed)
BP 127/81 (BP Location: Left Arm, Patient Position: Sitting, Cuff Size: Large)   Pulse 89   Temp 98.7 F (37.1 C)   Ht 5' 1.4" (1.56 m)   Wt 226 lb (102.5 kg)   LMP 03/17/2013 (Approximate)   SpO2 96%   BMI 42.15 kg/m    Subjective:    Patient ID: Sierra Copeland, female    DOB: 07-28-1956, 60 y.o.   MRN: [id number]  HPI: Sierra Copeland is a 60 y.o. female  Chief Complaint  Patient presents with  . Anxiety   DEPRESSION Mood status: better Satisfied with current treatment?: yes Symptom severity: mild  Duration of current treatment : increased dose for 1 month Side effects: no Medication compliance: excellent compliance Psychotherapy/counseling: no  Depressed mood: yes Anxious mood: yes Anhedonia: no Significant weight loss or gain: no Insomnia: no  Fatigue: no Feelings of worthlessness or guilt: no Impaired concentration/indecisiveness: no Suicidal ideations: no Hopelessness: no Crying spells: no Depression screen [facility] 2/9 07/14/2016 09/03/2015  Decreased Interest 0 0  Down, Depressed, Hopeless 0 0  PHQ - 2 Score 0 0  Altered sleeping 0 -  Tired, decreased energy 0 -  Change in appetite 0 -  Feeling bad or failure about yourself  0 -  Trouble concentrating 0 -  Moving slowly or fidgety/restless 0 -  Suicidal thoughts 0 -  PHQ-9 Score 0 -   GAD 7 : Generalized Anxiety Score 07/14/2016  Nervous, Anxious, on Edge 0  Control/stop worrying 0  Worry too much - different things 0  Trouble relaxing 0  Restless 1  Easily annoyed or irritable 1  Afraid - awful might happen 0  Total GAD 7 Score 2  Anxiety Difficulty Not difficult at all    Relevant past medical, surgical, family and social history reviewed and updated as indicated. Interim medical history since our last visit reviewed. Allergies and medications reviewed and updated.  Review of Systems  Constitutional: Negative.   Respiratory: Negative.   Cardiovascular: Negative.   Psychiatric/Behavioral:  Negative.     Per HPI unless specifically indicated above     Objective:    BP 127/81 (BP Location: Left Arm, Patient Position: Sitting, Cuff Size: Large)   Pulse 89   Temp 98.7 F (37.1 C)   Ht 5' 1.4" (1.56 m)   Wt 226 lb (102.5 kg)   LMP 03/17/2013 (Approximate)   SpO2 96%   BMI 42.15 kg/m   Wt Readings from Last 3 Encounters:  07/14/16 226 lb (102.5 kg)  06/04/16 226 lb 9.6 oz (102.8 kg)  05/13/16 230 lb (104.3 kg)    Physical Exam  Constitutional: She is oriented to person, place, and time. She appears well-developed and well-nourished. No distress.  HENT:  Head: Normocephalic and atraumatic.  Right Ear: Hearing normal.  Left Ear: Hearing normal.  Nose: Nose normal.  Eyes: Conjunctivae and lids are normal. Right eye exhibits no discharge. Left eye exhibits no discharge. No scleral icterus.  Cardiovascular: Normal rate, regular rhythm, normal heart sounds and intact distal pulses.  Exam reveals no gallop and no friction rub.   No murmur heard. Pulmonary/Chest: Effort normal and breath sounds normal. No respiratory distress. She has no wheezes. She has no rales. She exhibits no tenderness.  Musculoskeletal: Normal range of motion.  Neurological: She is alert and oriented to person, place, and time.  Skin: Skin is warm, dry and intact. No rash noted. No erythema. No pallor.  Psychiatric: She has a normal mood and  affect. Her speech is normal and behavior is normal. Judgment and thought content normal. Cognition and memory are normal.  Nursing note and vitals reviewed.   Results for orders placed or performed in visit on 07/14/16  Hemoglobin A1c  Result Value Ref Range   Hemoglobin A1C 7.8       Assessment & Plan:   Problem List Items Addressed This Visit      Other   Depression    Under good control. Continue current regimen. Continue to monitor. Call with any concerns.       Relevant Medications   PARoxetine (PAXIL) 20 MG tablet    Other Visit Diagnoses     None.      Follow up plan: Return in about 6 months (around 01/14/2017) for Physical.

## 2016-07-14 NOTE — Assessment & Plan Note (Signed)
Under good control. Continue current regimen. Continue to monitor. Call with any concerns. 

## 2016-09-03 ENCOUNTER — Encounter: Payer: 59 | Admitting: Family Medicine

## 2016-09-27 ENCOUNTER — Other Ambulatory Visit: Payer: Self-pay | Admitting: Family Medicine

## 2016-09-28 ENCOUNTER — Ambulatory Visit: Payer: 59 | Admitting: Family Medicine

## 2016-09-29 ENCOUNTER — Ambulatory Visit (INDEPENDENT_AMBULATORY_CARE_PROVIDER_SITE_OTHER): Payer: 59 | Admitting: Family Medicine

## 2016-09-29 ENCOUNTER — Encounter: Payer: Self-pay | Admitting: Family Medicine

## 2016-09-29 VITALS — BP 127/82 | HR 100 | Temp 98.7°F | Wt 221.0 lb

## 2016-09-29 DIAGNOSIS — N390 Urinary tract infection, site not specified: Secondary | ICD-10-CM

## 2016-09-29 MED ORDER — SULFAMETHOXAZOLE-TRIMETHOPRIM 800-160 MG PO TABS: 1.0000 | ORAL_TABLET | Freq: Two times a day (BID) | ORAL | 0 refills | Status: DC

## 2016-09-29 NOTE — Patient Instructions (Signed)
Follow up as needed

## 2016-09-29 NOTE — Progress Notes (Signed)
   BP 127/82   Pulse 100   Temp 98.7 F (37.1 C)   Wt 221 lb (100.2 kg)   LMP 03/17/2013 (Approximate)   SpO2 95%   BMI 41.22 kg/m    Subjective:    Patient ID: Sierra Copeland, female    DOB: 10/03/1956, 60 y.o.   MRN: [id number]  HPI: Sierra Copeland is a 60 y.o. female  Chief Complaint  Patient presents with  . Urinary Tract Infection    Started over the weekend, running a fever, dark urine, some discomfort. She wonders if the Merleen Nicely may be causing it. No frequency or burning.    Dark urine, low grade fever (100.7) one day, chills, and some mild suprapubic pressure extending back to low back x 2-3 days now. No dysuria but states the urine smells very strong. Has not tried anything OTC for her symptoms. Never had a UTI before. Denies N/V/D, CP, SOB.   Relevant past medical, surgical, family and social history reviewed and updated as indicated. Interim medical history since our last visit reviewed. Allergies and medications reviewed and updated.  Review of Systems  Constitutional: Positive for chills and fever.  HENT: Negative.   Respiratory: Negative.   Cardiovascular: Negative.   Gastrointestinal: Positive for abdominal pain.  Genitourinary: Negative.   Musculoskeletal: Positive for back pain.  Skin: Negative.   Neurological: Negative.   Psychiatric/Behavioral: Negative.     Per HPI unless specifically indicated above     Objective:    BP 127/82   Pulse 100   Temp 98.7 F (37.1 C)   Wt 221 lb (100.2 kg)   LMP 03/17/2013 (Approximate)   SpO2 95%   BMI 41.22 kg/m   Wt Readings from Last 3 Encounters:  09/29/16 221 lb (100.2 kg)  07/14/16 226 lb (102.5 kg)  06/04/16 226 lb 9.6 oz (102.8 kg)    Physical Exam  Constitutional: She is oriented to person, place, and time. She appears well-developed and well-nourished. No distress.  HENT:  Head: Atraumatic.  Eyes: Conjunctivae are normal. No scleral icterus.  Neck: Normal range of motion. Neck supple.    Cardiovascular: Normal rate, regular rhythm and normal heart sounds.   Pulmonary/Chest: Effort normal and breath sounds normal. No respiratory distress.  Abdominal: Soft. Bowel sounds are normal. She exhibits no distension. There is tenderness (mild TTP in suprapubic area).  Musculoskeletal: Normal range of motion.  No CVA tenderness b/l   Lymphadenopathy:    She has no cervical adenopathy.  Neurological: She is alert and oriented to person, place, and time.  Skin: Skin is warm and dry.  Psychiatric: She has a normal mood and affect. Her behavior is normal.  Nursing note and vitals reviewed.     Assessment & Plan:   Problem List Items Addressed This Visit    None    Visit Diagnoses    Acute lower UTI    -  Primary   Low suspicion for pyelo at this time given mild quality of her symptoms. Will treat for UTI, await cx. Repeat U/A in several days if no better   Relevant Medications   sulfamethoxazole-trimethoprim (BACTRIM DS,SEPTRA DS) 800-160 MG tablet   Other Relevant Orders   UA/M w/rflx Culture, Routine (STAT)       Follow up plan: Return if symptoms worsen or fail to improve.

## 2016-10-01 LAB — MICROSCOPIC EXAMINATION
Epithelial Cells (non renal): >10 /hpf — AB (ref 0–10)
RBC MICROSCOPIC, UA: NONE SEEN /HPF (ref 0–?)

## 2016-10-01 LAB — URINE CULTURE, REFLEX

## 2016-10-01 LAB — UA/M W/RFLX CULTURE, ROUTINE
BILIRUBIN UA: NEGATIVE
NITRITE UA: NEGATIVE
RBC UA: NEGATIVE
SPEC GRAV UA: 1.02 (ref 1.005–1.030)
UUROB: 1 mg/dL (ref 0.2–1.0)
pH, UA: 5.5 (ref 5.0–7.5)

## 2016-10-04 ENCOUNTER — Telehealth: Payer: Self-pay | Admitting: Family Medicine

## 2016-10-04 NOTE — Telephone Encounter (Signed)
Pt called stated she would to know results from her UA. Please call pt back at work # ASAP. Thanks.

## 2016-10-04 NOTE — Telephone Encounter (Signed)
Routing to provider for results.  

## 2016-10-05 NOTE — Telephone Encounter (Signed)
Urine culture was negative for abnormal bacteria. She should send repeat urine specimen if still symptomatic, but likely the antibiotic should take care of her UTI

## 2016-10-05 NOTE — Telephone Encounter (Signed)
Patient notified

## 2016-10-11 LAB — HEMOGLOBIN A1C: Hemoglobin A1C: 7.2

## 2016-11-29 HISTORY — PX: MASTECTOMY: SHX3

## 2016-11-29 HISTORY — PX: BREAST LUMPECTOMY: SHX2

## 2017-01-14 ENCOUNTER — Encounter: Payer: Self-pay | Admitting: Family Medicine

## 2017-01-14 ENCOUNTER — Ambulatory Visit (INDEPENDENT_AMBULATORY_CARE_PROVIDER_SITE_OTHER): Payer: 59 | Admitting: Family Medicine

## 2017-01-14 ENCOUNTER — Telehealth: Payer: Self-pay | Admitting: Family Medicine

## 2017-01-14 VITALS — BP 131/76 | HR 83 | Temp 99.3°F | Resp 17 | Ht 61.5 in | Wt 221.0 lb

## 2017-01-14 DIAGNOSIS — K52831 Collagenous colitis: Secondary | ICD-10-CM | POA: Diagnosis not present

## 2017-01-14 DIAGNOSIS — F3342 Major depressive disorder, recurrent, in full remission: Secondary | ICD-10-CM | POA: Diagnosis not present

## 2017-01-14 DIAGNOSIS — Z794 Long term (current) use of insulin: Secondary | ICD-10-CM | POA: Diagnosis not present

## 2017-01-14 DIAGNOSIS — Z0000 Encounter for general adult medical examination without abnormal findings: Secondary | ICD-10-CM

## 2017-01-14 DIAGNOSIS — Z1159 Encounter for screening for other viral diseases: Secondary | ICD-10-CM

## 2017-01-14 DIAGNOSIS — I129 Hypertensive chronic kidney disease with stage 1 through stage 4 chronic kidney disease, or unspecified chronic kidney disease: Secondary | ICD-10-CM

## 2017-01-14 DIAGNOSIS — E1165 Type 2 diabetes mellitus with hyperglycemia: Secondary | ICD-10-CM

## 2017-01-14 DIAGNOSIS — E7800 Pure hypercholesterolemia, unspecified: Secondary | ICD-10-CM | POA: Diagnosis not present

## 2017-01-14 MED ORDER — LISINOPRIL 20 MG PO TABS: 20.0000 mg | ORAL_TABLET | Freq: Every day | ORAL | 3 refills | Status: DC

## 2017-01-14 MED ORDER — L-METHYLFOLATE-B6-B12 3-35-2 MG PO TABS: 1.0000 | ORAL_TABLET | Freq: Every day | ORAL | 3 refills | Status: DC

## 2017-01-14 MED ORDER — SIMVASTATIN 40 MG PO TABS: ORAL_TABLET | ORAL | 3 refills | Status: DC

## 2017-01-14 NOTE — Assessment & Plan Note (Signed)
Under good control. Continue current regimen. Continue to monitor. Recheck 6 months.  

## 2017-01-14 NOTE — Telephone Encounter (Signed)
Informed patient her FMLA paperwork is ready for pick up.

## 2017-01-14 NOTE — Progress Notes (Signed)
BP 131/76 (BP Location: Left Arm, Patient Position: Sitting, Cuff Size: Large)   Pulse 83   Temp 99.3 F (37.4 C) (Oral)   Resp 17   Ht 5' 1.5" (1.562 m)   Wt 221 lb (100.2 kg)   LMP 03/17/2013 (Approximate)   SpO2 96%   BMI 41.08 kg/m    Subjective:    Patient ID: Sierra Copeland, female    DOB: 01/18/1956, 61 y.o.   MRN: [id number]  HPI: Sierra Copeland is a 61 y.o. female presenting on 01/14/2017 for comprehensive medical examination. Current medical complaints include:  DEPRESSION Mood status: controlled Satisfied with current treatment?: yes Symptom severity: mild  Duration of current treatment : chronic Side effects: no Medication compliance: excellent compliance Psychotherapy/counseling: no  Previous psychiatric medications: paxil Depressed mood: no Anxious mood: no Anhedonia: no Significant weight loss or gain: no Insomnia: no  Fatigue: no Feelings of worthlessness or guilt: no Impaired concentration/indecisiveness: no Suicidal ideations: no Hopelessness: no Crying spells: no  HYPERTENSION / HYPERLIPIDEMIA Satisfied with current treatment? yes Duration of hypertension: chronic BP monitoring frequency: rarely BP medication side effects: no Past BP meds: lisinopril Duration of hyperlipidemia: chronic Cholesterol medication side effects: no Cholesterol supplements: none Past cholesterol medications: simvastatin Medication compliance: excellent compliance Aspirin: no Recent stressors: yes Recurrent headaches: no Visual changes: no Palpitations: no Dyspnea: no Chest pain: no Lower extremity edema: no Dizzy/lightheaded: no  Menopausal Symptoms: no  Depression Screen done today and results listed below:  Depression screen [facility] 2/9 01/14/2017 07/14/2016 09/03/2015  Decreased Interest 0 0 0  Down, Depressed, Hopeless 0 0 0  PHQ - 2 Score 0 0 0  Altered sleeping - 0 -  Tired, decreased energy - 0 -  Change in appetite - 0 -  Feeling bad or failure about  yourself  - 0 -  Trouble concentrating - 0 -  Moving slowly or fidgety/restless - 0 -  Suicidal thoughts - 0 -  PHQ-9 Score - 0 -    Past Medical History:  Past Medical History:  Diagnosis Date  . Abnormal mammogram   . Arthritis   . Back pain    spinal stenosis and neck bone spur  . Car sickness   . Collagenous colitis   . Depression   . Diabetes mellitus without complication ([place])   . GERD (gastroesophageal reflux disease)    occ-no meds  . Hair loss    TAKING THYROTAIN  . Headache    migraines  . Hyperlipemia   . Hypertension   . Osteoarthritis    seen by Rheum, not enoug evidence to support RA  . Pinched nerve   . PONV (postoperative nausea and vomiting)   . Rosacea   . Seborrheic dermatitis   . Sleep apnea   . Wears glasses     Surgical History:  Past Surgical History:  Procedure Laterality Date  . ACHILLES TENDON REPAIR  2007   right  . BREAST SURGERY  2011   lt br bx-non cancer  . CARPAL TUNNEL RELEASE     right and left  . COLONOSCOPY    . epidural steroid injection x2  2017  . ESOPHAGOGASTRODUODENOSCOPY N/A 01/21/2016   Procedure: ESOPHAGOGASTRODUODENOSCOPY (EGD);  Surgeon: Hulen Luster, MD;  Location: [place];  Service: Gastroenterology;  Laterality: N/A;  Please call at work [phone] Diabetic - insulin  . FINGER EXPLORATION  2013   left long   . NERVE EXPLORATION Left 10/03/2013   Procedure: NERVE EXPLORATION/ LEFT  LONG RADIAL DISTAL NERVE neurolysis;  Surgeon: Schuyler Amor, MD;  Location: [place];  Service: Orthopedics;  Laterality: Left;  . SHOULDER ARTHROSCOPY WITH DEBRIDEMENT AND BICEP TENDON REPAIR Right 05/13/2016   Procedure: Arthroscopic debridement, open decompression, rotator cuff repair, tenodesis,right shoulder.;  Surgeon: Corky Mull, MD;  Location: [facility];  Service: Orthopedics;  Laterality: Right;  . TONSILLECTOMY    . UVULOPALATOPHARYNGOPLASTY  1998   and tonsils-tongue pexi    Medications:    Current Outpatient Prescriptions on File Prior to Visit  Medication Sig  . aspirin-acetaminophen-caffeine (EXCEDRIN MIGRAINE) 250-250-65 MG tablet Take 1 tablet by mouth every 6 (six) hours as needed for headache.  . Dapagliflozin-Metformin HCl ER (XIGDUO XR) 03-999 MG TB24 Take 5-1,000 mg by mouth 2 (two) times daily.   . fexofenadine (ALLEGRA) 180 MG tablet Take 180 mg by mouth daily.  . fluticasone (FLONASE) 50 MCG/ACT nasal spray Place into both nostrils daily.  Marland Kitchen gabapentin (NEURONTIN) 100 MG capsule Take 200 mg by mouth 2 (two) times daily.   Marland Kitchen glucose blood test strip 1 each by Other route as needed for other. One Touch Ultra. Test FSBS 3x a day. Dx:E11.65, LON 99 months: disp 300 with 1 refill.  Marland Kitchen ibuprofen (ADVIL,MOTRIN) 200 MG tablet Take 200 mg by mouth every 6 (six) hours as needed.  . Insulin Glargine (TOUJEO SOLOSTAR) 300 UNIT/ML SOPN Inject into the skin.  Marland Kitchen insulin lispro (HUMALOG) 100 UNIT/ML injection Inject into the skin 2 times daily at 12 noon and 4 pm. 15 UNITS BEFORE BREAKFAST AND 25 UNITS BEFORE SUPPER  . Insulin Syringe-Needle U-100 30G X 5/16" 0.3 ML MISC   . PARoxetine (PAXIL) 20 MG tablet Take 1 tablet (20 mg total) by mouth daily.  Marland Kitchen tiZANidine (ZANAFLEX) 2 MG tablet Take 2 mg by mouth at bedtime.   . traMADol (ULTRAM) 50 MG tablet Take 1 tablet by mouth 3 (three) times daily.  Marland Kitchen omeprazole (PRILOSEC) 20 MG capsule Take 20 mg by mouth as needed.    No current facility-administered medications on file prior to visit.     Allergies:  Allergies  Allergen Reactions  . Prednisone Palpitations    Social History:  Social History   Social History  . Marital status: Married    Spouse name: N/A  . Number of children: N/A  . Years of education: N/A   Occupational History  . Not on file.   Social History Main Topics  . Smoking status: Former Smoker    Packs/day: 1.00    Years: 10.00    Types: Cigarettes    Quit date: 09/28/1986  . Smokeless tobacco:  Never Used  . Alcohol use No  . Drug use: No  . Sexual activity: Yes   Other Topics Concern  . Not on file   Social History Narrative  . No narrative on file   History  Smoking Status  . Former Smoker  . Packs/day: 1.00  . Years: 10.00  . Types: Cigarettes  . Quit date: 09/28/1986  Smokeless Tobacco  . Never Used   History  Alcohol Use No    Family History:  Family History  Problem Relation Age of Onset  . Cancer Mother     liver  . Hypertension Mother   . Hyperlipidemia Mother   . Hyperlipidemia Father   . Hypertension Father   . Stroke Maternal Grandfather   . Diabetes Paternal Grandmother   . Dementia Maternal Grandmother   . Heart disease Paternal Grandfather   .  Diabetes Maternal Aunt   . Diabetes Maternal Uncle   . Heart disease Maternal Uncle   . Cancer Paternal Aunt     breast  . Diabetes Paternal Aunt   . Diabetes Paternal Uncle   . Cancer Paternal Uncle     prostate  . Cancer Paternal Aunt     breast    Past medical history, surgical history, medications, allergies, family history and social history reviewed with patient today and changes made to appropriate areas of the chart.   Review of Systems  Constitutional: Negative.   HENT: Negative.   Eyes: Negative.   Respiratory: Negative.   Cardiovascular: Negative.   Gastrointestinal: Positive for abdominal pain, diarrhea and heartburn. Negative for blood in stool, constipation, melena, nausea and vomiting.  Genitourinary: Negative.   Musculoskeletal: Negative.   Skin: Negative.   Neurological: Negative.   Endo/Heme/Allergies: Positive for environmental allergies. Negative for polydipsia. Does not bruise/bleed easily.  Psychiatric/Behavioral: Negative.     All other ROS negative except what is listed above and in the HPI.      Objective:    BP 131/76 (BP Location: Left Arm, Patient Position: Sitting, Cuff Size: Large)   Pulse 83   Temp 99.3 F (37.4 C) (Oral)   Resp 17   Ht 5' 1.5"  (1.562 m)   Wt 221 lb (100.2 kg)   LMP 03/17/2013 (Approximate)   SpO2 96%   BMI 41.08 kg/m   Wt Readings from Last 3 Encounters:  01/14/17 221 lb (100.2 kg)  09/29/16 221 lb (100.2 kg)  07/14/16 226 lb (102.5 kg)    Physical Exam  Constitutional: She is oriented to person, place, and time. She appears well-developed and well-nourished. No distress.  HENT:  Head: Normocephalic and atraumatic.  Right Ear: Hearing, tympanic membrane, external ear and ear canal normal.  Left Ear: Hearing, tympanic membrane, external ear and ear canal normal.  Nose: Nose normal.  Mouth/Throat: Uvula is midline, oropharynx is clear and moist and mucous membranes are normal. No oropharyngeal exudate.  Eyes: Conjunctivae, EOM and lids are normal. Pupils are equal, round, and reactive to light. Right eye exhibits no discharge. Left eye exhibits no discharge. No scleral icterus.  Neck: Normal range of motion. Neck supple. No JVD present. No tracheal deviation present. No thyromegaly present.  Cardiovascular: Normal rate, regular rhythm, normal heart sounds and intact distal pulses.  Exam reveals no gallop and no friction rub.   No murmur heard. Pulmonary/Chest: Effort normal and breath sounds normal. No stridor. No respiratory distress. She has no wheezes. She has no rales. She exhibits no tenderness. Right breast exhibits no inverted nipple, no mass, no nipple discharge, no skin change and no tenderness. Left breast exhibits no inverted nipple, no mass, no nipple discharge, no skin change and no tenderness. Breasts are symmetrical.  Fibrocystic changes throughout  Abdominal: Soft. Bowel sounds are normal. She exhibits no distension and no mass. There is no tenderness. There is no rebound and no guarding.  Genitourinary:  Genitourinary Comments: Pelvic exam deferred with shared decision making.   Musculoskeletal: Normal range of motion. She exhibits no edema, tenderness or deformity.  Lymphadenopathy:    She has  no cervical adenopathy.  Neurological: She is alert and oriented to person, place, and time. She displays normal reflexes. No cranial nerve deficit. She exhibits normal muscle tone. Coordination normal.  Skin: Skin is warm, dry and intact. No rash noted. She is not diaphoretic. No erythema. No pallor.  Psychiatric: She has a normal  mood and affect. Her speech is normal and behavior is normal. Judgment and thought content normal. Cognition and memory are normal.  Nursing note and vitals reviewed.   Results for orders placed or performed in visit on 01/14/17  Hemoglobin A1c  Result Value Ref Range   Hemoglobin A1C 7.2       Assessment & Plan:   Problem List Items Addressed This Visit      Digestive   Collagenous colitis    In a slight flare. Will call GI if not improving.       Relevant Orders   CBC with Differential/Platelet   Comprehensive metabolic panel     Endocrine   Type II diabetes mellitus, uncontrolled ([place])    Stable. Continue to follow with Dr. Gabriel Carina. Due to see her in about 10 days.       Relevant Medications   simvastatin (ZOCOR) 40 MG tablet   lisinopril (PRINIVIL,ZESTRIL) 20 MG tablet   Other Relevant Orders   Comprehensive metabolic panel   UA/M w/rflx Culture, Routine     Genitourinary   Benign hypertensive renal disease    Under good control. Continue current regimen. Continue to monitor. Recheck 6 months.      Relevant Orders   Comprehensive metabolic panel   Microalbumin, Urine Waived     Other   Hypercholesteremia    Rechecking levels today. Call with any concerns. Continue current regimen.       Relevant Medications   simvastatin (ZOCOR) 40 MG tablet   lisinopril (PRINIVIL,ZESTRIL) 20 MG tablet   Other Relevant Orders   Comprehensive metabolic panel   Lipid Panel w/o Chol/HDL Ratio   Morbid obesity ([place])    Work on diet and exercise. Congratulated patient on weight loss.       Relevant Orders   Comprehensive metabolic panel    Depression    Stable. Continue current regimen. Call with any concerns.       Relevant Orders   Comprehensive metabolic panel   TSH    Other Visit Diagnoses    Routine general medical examination at a health care facility    -  Primary   Up to date on vaccines. Screening labs checked today. Pap and colonscopy up to date. Mammogram ordered today. Continue diet and exercise.     Relevant Orders   CBC with Differential/Platelet   Comprehensive metabolic panel   Lipid Panel w/o Chol/HDL Ratio   Microalbumin, Urine Waived   TSH   UA/M w/rflx Culture, Routine   Need for hepatitis C screening test       Declined by patient.       Follow up plan: Return in about 6 months (around 07/14/2017) for Mood/BP/Cholesterol.   LABORATORY TESTING:  - Pap smear: up to date  IMMUNIZATIONS:   - Tdap: Tetanus vaccination status reviewed: last tetanus booster within 10 years. - Influenza: Up to date - Pneumovax: Up to date - Prevnar: Not applicable - Zostavax vaccine: Not applicable- will get when she's 61  SCREENING: -Mammogram: done elsewhere  - Colonoscopy: Up to date  - Bone Density: Not applicable   PATIENT COUNSELING:   Advised to take 1 mg of folate supplement per day if capable of pregnancy.   Sexuality: Discussed sexually transmitted diseases, partner selection, use of condoms, avoidance of unintended pregnancy  and contraceptive alternatives.   Advised to avoid cigarette smoking.  I discussed with the patient that most people either abstain from alcohol or drink within safe limits (<=14/week and <=4  drinks/occasion for males, <=7/weeks and <= 3 drinks/occasion for females) and that the risk for alcohol disorders and other health effects rises proportionally with the number of drinks per week and how often a drinker exceeds daily limits.  Discussed cessation/primary prevention of drug use and availability of treatment for abuse.   Diet: Encouraged to adjust caloric intake to  maintain  or achieve ideal body weight, to reduce intake of dietary saturated fat and total fat, to limit sodium intake by avoiding high sodium foods and not adding table salt, and to maintain adequate dietary potassium and calcium preferably from fresh fruits, vegetables, and low-fat dairy products.    stressed the importance of regular exercise  Injury prevention: Discussed safety belts, safety helmets, smoke detector, smoking near bedding or upholstery.   Dental health: Discussed importance of regular tooth brushing, flossing, and dental visits.    NEXT PREVENTATIVE PHYSICAL DUE IN 1 YEAR. Return in about 6 months (around 07/14/2017) for Mood/BP/Cholesterol.

## 2017-01-14 NOTE — Assessment & Plan Note (Signed)
Work on diet and exercise. Congratulated patient on weight loss.

## 2017-01-14 NOTE — Assessment & Plan Note (Signed)
Stable. Continue to follow with Dr. Gabriel Carina. Due to see her in about 10 days.

## 2017-01-14 NOTE — Assessment & Plan Note (Signed)
Rechecking levels today. Call with any concerns. Continue current regimen.  

## 2017-01-14 NOTE — Telephone Encounter (Signed)
Would you please let her know that a copy of her FMLA form is up front for her to pick up? Thanks!

## 2017-01-14 NOTE — Assessment & Plan Note (Signed)
Stable. Continue current regimen. Call with any concerns.  

## 2017-01-14 NOTE — Patient Instructions (Addendum)
Health Maintenance, Female Introduction Adopting a healthy lifestyle and getting preventive care can go a long way to promote health and wellness. Talk with your health care provider about what schedule of regular examinations is right for you. This is a good chance for you to check in with your provider about disease prevention and staying healthy. In between checkups, there are plenty of things you can do on your own. Experts have done a lot of research about which lifestyle changes and preventive measures are most likely to keep you healthy. Ask your health care provider for more information. Weight and diet Eat a healthy diet  Be sure to include plenty of vegetables, fruits, low-fat dairy products, and lean protein.  Do not eat a lot of foods high in solid fats, added sugars, or salt.  Get regular exercise. This is one of the most important things you can do for your health.  Most adults should exercise for at least 150 minutes each week. The exercise should increase your heart rate and make you sweat (moderate-intensity exercise).  Most adults should also do strengthening exercises at least twice a week. This is in addition to the moderate-intensity exercise. Maintain a healthy weight  Body mass index (BMI) is a measurement that can be used to identify possible weight problems. It estimates body fat based on height and weight. Your health care provider can help determine your BMI and help you achieve or maintain a healthy weight.  For females 60 years of age and older:  A BMI below 18.5 is considered underweight.  A BMI of 18.5 to 24.9 is normal.  A BMI of 25 to 29.9 is considered overweight.  A BMI of 30 and above is considered obese. Watch levels of cholesterol and blood lipids  You should start having your blood tested for lipids and cholesterol at 60 years of age, then have this test every 5 years.  You may need to have your cholesterol levels checked more often if:  Your  lipid or cholesterol levels are high.  You are older than 60 years of age.  You are at high risk for heart disease. Cancer screening Lung Cancer  Lung cancer screening is recommended for adults 60-22 years old who are at high risk for lung cancer because of a history of smoking.  A yearly low-dose CT scan of the lungs is recommended for people who:  Currently smoke.  Have quit within the past 15 years.  Have at least a 30-pack-year history of smoking. A pack year is smoking an average of one pack of cigarettes a day for 1 year.  Yearly screening should continue until it has been 15 years since you quit.  Yearly screening should stop if you develop a health problem that would prevent you from having lung cancer treatment. Breast Cancer  Practice breast self-awareness. This means understanding how your breasts normally appear and feel.  It also means doing regular breast self-exams. Let your health care provider know about any changes, no matter how small.  If you are in your 20s or 30s, you should have a clinical breast exam (CBE) by a health care provider every 1-3 years as part of a regular health exam.  If you are 35 or older, have a CBE every year. Also consider having a breast X-ray (mammogram) every year.  If you have a family history of breast cancer, talk to your health care provider about genetic screening.  If you are at high risk for breast cancer,  talk to your health care provider about having an MRI and a mammogram every year.  Breast cancer gene (BRCA) assessment is recommended for women who have family members with BRCA-related cancers. BRCA-related cancers include:  Breast.  Ovarian.  Tubal.  Peritoneal cancers.  Results of the assessment will determine the need for genetic counseling and BRCA1 and BRCA2 testing. Cervical Cancer  Your health care provider may recommend that you be screened regularly for cancer of the pelvic organs (ovaries, uterus, and  vagina). This screening involves a pelvic examination, including checking for microscopic changes to the surface of your cervix (Pap test). You may be encouraged to have this screening done every 3 years, beginning at age 21.  For women ages 30-65, health care providers may recommend pelvic exams and Pap testing every 3 years, or they may recommend the Pap and pelvic exam, combined with testing for human papilloma virus (HPV), every 5 years. Some types of HPV increase your risk of cervical cancer. Testing for HPV may also be done on women of any age with unclear Pap test results.  Other health care providers may not recommend any screening for nonpregnant women who are considered low risk for pelvic cancer and who do not have symptoms. Ask your health care provider if a screening pelvic exam is right for you.  If you have had past treatment for cervical cancer or a condition that could lead to cancer, you need Pap tests and screening for cancer for at least 20 years after your treatment. If Pap tests have been discontinued, your risk factors (such as having a new sexual partner) need to be reassessed to determine if screening should resume. Some women have medical problems that increase the chance of getting cervical cancer. In these cases, your health care provider may recommend more frequent screening and Pap tests. Colorectal Cancer  This type of cancer can be detected and often prevented.  Routine colorectal cancer screening usually begins at 60 years of age and continues through 60 years of age.  Your health care provider may recommend screening at an earlier age if you have risk factors for colon cancer.  Your health care provider may also recommend using home test kits to check for hidden blood in the stool.  A small camera at the end of a tube can be used to examine your colon directly (sigmoidoscopy or colonoscopy). This is done to check for the earliest forms of colorectal  cancer.  Routine screening usually begins at age 50.  Direct examination of the colon should be repeated every 5-10 years through 60 years of age. However, you may need to be screened more often if early forms of precancerous polyps or small growths are found. Skin Cancer  Check your skin from head to toe regularly.  Tell your health care provider about any new moles or changes in moles, especially if there is a change in a mole's shape or color.  Also tell your health care provider if you have a mole that is larger than the size of a pencil eraser.  Always use sunscreen. Apply sunscreen liberally and repeatedly throughout the day.  Protect yourself by wearing long sleeves, pants, a wide-brimmed hat, and sunglasses whenever you are outside. Heart disease, diabetes, and high blood pressure  High blood pressure causes heart disease and increases the risk of stroke. High blood pressure is more likely to develop in:  People who have blood pressure in the high end of the normal range (130-139/85-89 mm Hg).    People who are overweight or obese.  People who are African American.  If you are 18-39 years of age, have your blood pressure checked every 3-5 years. If you are 40 years of age or older, have your blood pressure checked every year. You should have your blood pressure measured twice-once when you are at a hospital or clinic, and once when you are not at a hospital or clinic. Record the average of the two measurements. To check your blood pressure when you are not at a hospital or clinic, you can use:  An automated blood pressure machine at a pharmacy.  A home blood pressure monitor.  If you are between 55 years and 79 years old, ask your health care provider if you should take aspirin to prevent strokes.  Have regular diabetes screenings. This involves taking a blood sample to check your fasting blood sugar level.  If you are at a normal weight and have a low risk for diabetes,  have this test once every three years after 60 years of age.  If you are overweight and have a high risk for diabetes, consider being tested at a younger age or more often. Preventing infection Hepatitis B  If you have a higher risk for hepatitis B, you should be screened for this virus. You are considered at high risk for hepatitis B if:  You were born in a country where hepatitis B is common. Ask your health care provider which countries are considered high risk.  Your parents were born in a high-risk country, and you have not been immunized against hepatitis B (hepatitis B vaccine).  You have HIV or AIDS.  You use needles to inject street drugs.  You live with someone who has hepatitis B.  You have had sex with someone who has hepatitis B.  You get hemodialysis treatment.  You take certain medicines for conditions, including cancer, organ transplantation, and autoimmune conditions. Hepatitis C  Blood testing is recommended for:  Everyone born from 1945 through 1965.  Anyone with known risk factors for hepatitis C. Sexually transmitted infections (STIs)  You should be screened for sexually transmitted infections (STIs) including gonorrhea and chlamydia if:  You are sexually active and are younger than 60 years of age.  You are older than 60 years of age and your health care provider tells you that you are at risk for this type of infection.  Your sexual activity has changed since you were last screened and you are at an increased risk for chlamydia or gonorrhea. Ask your health care provider if you are at risk.  If you do not have HIV, but are at risk, it may be recommended that you take a prescription medicine daily to prevent HIV infection. This is called pre-exposure prophylaxis (PrEP). You are considered at risk if:  You are sexually active and do not regularly use condoms or know the HIV status of your partner(s).  You take drugs by injection.  You are sexually  active with a partner who has HIV. Talk with your health care provider about whether you are at high risk of being infected with HIV. If you choose to begin PrEP, you should first be tested for HIV. You should then be tested every 3 months for as long as you are taking PrEP. Pregnancy  If you are premenopausal and you may become pregnant, ask your health care provider about preconception counseling.  If you may become pregnant, take 400 to 800 micrograms (mcg) of folic acid   every day.  If you want to prevent pregnancy, talk to your health care provider about birth control (contraception). Osteoporosis and menopause  Osteoporosis is a disease in which the bones lose minerals and strength with aging. This can result in serious bone fractures. Your risk for osteoporosis can be identified using a bone density scan.  If you are 55 years of age or older, or if you are at risk for osteoporosis and fractures, ask your health care provider if you should be screened.  Ask your health care provider whether you should take a calcium or vitamin D supplement to lower your risk for osteoporosis.  Menopause may have certain physical symptoms and risks.  Hormone replacement therapy may reduce some of these symptoms and risks. Talk to your health care provider about whether hormone replacement therapy is right for you. Follow these instructions at home:  Schedule regular health, dental, and eye exams.  Stay current with your immunizations.  Do not use any tobacco products including cigarettes, chewing tobacco, or electronic cigarettes.  If you are pregnant, do not drink alcohol.  If you are breastfeeding, limit how much and how often you drink alcohol.  Limit alcohol intake to no more than 1 drink per day for nonpregnant women. One drink equals 12 ounces of beer, 5 ounces of wine, or 1 ounces of hard liquor.  Do not use street drugs.  Do not share needles.  Ask your health care provider for  help if you need support or information about quitting drugs.  Tell your health care provider if you often feel depressed.  Tell your health care provider if you have ever been abused or do not feel safe at home. This information is not intended to replace advice given to you by your health care provider. Make sure you discuss any questions you have with your health care provider. Document Released: 05/31/2011 Document Revised: 04/22/2016 Document Reviewed: 08/19/2015  2017 [employer]  Health Maintenance for Postmenopausal Women Introduction Menopause is a normal process in which your reproductive ability comes to an end. This process happens gradually over a span of months to years, usually between the ages of 63 and 11. Menopause is complete when you have missed 12 consecutive menstrual periods. It is important to talk with your health care provider about some of the most common conditions that affect postmenopausal women, such as heart disease, cancer, and bone loss (osteoporosis). Adopting a healthy lifestyle and getting preventive care can help to promote your health and wellness. Those actions can also lower your chances of developing some of these common conditions. What should I know about menopause? During menopause, you may experience a number of symptoms, such as:  Moderate-to-severe hot flashes.  Night sweats.  Decrease in sex drive.  Mood swings.  Headaches.  Tiredness.  Irritability.  Memory problems.  Insomnia. Choosing to treat or not to treat menopausal changes is an individual decision that you make with your health care provider. What should I know about hormone replacement therapy and supplements? Hormone therapy products are effective for treating symptoms that are associated with menopause, such as hot flashes and night sweats. Hormone replacement carries certain risks, especially as you become older. If you are thinking about using estrogen or estrogen with  progestin treatments, discuss the benefits and risks with your health care provider. What should I know about heart disease and stroke? Heart disease, heart attack, and stroke become more likely as you age. This may be due, in part, to the hormonal  changes that your body experiences during menopause. These can affect how your body processes dietary fats, triglycerides, and cholesterol. Heart attack and stroke are both medical emergencies. There are many things that you can do to help prevent heart disease and stroke:  Have your blood pressure checked at least every 1-2 years. High blood pressure causes heart disease and increases the risk of stroke.  If you are 86-51 years old, ask your health care provider if you should take aspirin to prevent a heart attack or a stroke.  Do not use any tobacco products, including cigarettes, chewing tobacco, or electronic cigarettes. If you need help quitting, ask your health care provider.  It is important to eat a healthy diet and maintain a healthy weight.  Be sure to include plenty of vegetables, fruits, low-fat dairy products, and lean protein.  Avoid eating foods that are high in solid fats, added sugars, or salt (sodium).  Get regular exercise. This is one of the most important things that you can do for your health.  Try to exercise for at least 150 minutes each week. The type of exercise that you do should increase your heart rate and make you sweat. This is known as moderate-intensity exercise.  Try to do strengthening exercises at least twice each week. Do these in addition to the moderate-intensity exercise.  Know your numbers.Ask your health care provider to check your cholesterol and your blood glucose. Continue to have your blood tested as directed by your health care provider. What should I know about cancer screening? There are several types of cancer. Take the following steps to reduce your risk and to catch any cancer development as  early as possible. Breast Cancer  Practice breast self-awareness.  This means understanding how your breasts normally appear and feel.  It also means doing regular breast self-exams. Let your health care provider know about any changes, no matter how small.  If you are 61 or older, have a clinician do a breast exam (clinical breast exam or CBE) every year. Depending on your age, family history, and medical history, it may be recommended that you also have a yearly breast X-ray (mammogram).  If you have a family history of breast cancer, talk with your health care provider about genetic screening.  If you are at high risk for breast cancer, talk with your health care provider about having an MRI and a mammogram every year.  Breast cancer (BRCA) gene test is recommended for women who have family members with BRCA-related cancers. Results of the assessment will determine the need for genetic counseling and BRCA1 and for BRCA2 testing. BRCA-related cancers include these types:  Breast. This occurs in males or females.  Ovarian.  Tubal. This may also be called fallopian tube cancer.  Cancer of the abdominal or pelvic lining (peritoneal cancer).  Prostate.  Pancreatic. Cervical, Uterine, and Ovarian Cancer  Your health care provider may recommend that you be screened regularly for cancer of the pelvic organs. These include your ovaries, uterus, and vagina. This screening involves a pelvic exam, which includes checking for microscopic changes to the surface of your cervix (Pap test).  For women ages 21-65, health care providers may recommend a pelvic exam and a Pap test every three years. For women ages 36-65, they may recommend the Pap test and pelvic exam, combined with testing for human papilloma virus (HPV), every five years. Some types of HPV increase your risk of cervical cancer. Testing for HPV may also be done on  women of any age who have unclear Pap test results.  Other health care  providers may not recommend any screening for nonpregnant women who are considered low risk for pelvic cancer and have no symptoms. Ask your health care provider if a screening pelvic exam is right for you.  If you have had past treatment for cervical cancer or a condition that could lead to cancer, you need Pap tests and screening for cancer for at least 20 years after your treatment. If Pap tests have been discontinued for you, your risk factors (such as having a new sexual partner) need to be reassessed to determine if you should start having screenings again. Some women have medical problems that increase the chance of getting cervical cancer. In these cases, your health care provider may recommend that you have screening and Pap tests more often.  If you have a family history of uterine cancer or ovarian cancer, talk with your health care provider about genetic screening.  If you have vaginal bleeding after reaching menopause, tell your health care provider.  There are currently no reliable tests available to screen for ovarian cancer. Lung Cancer  Lung cancer screening is recommended for adults 47-80 years old who are at high risk for lung cancer because of a history of smoking. A yearly low-dose CT scan of the lungs is recommended if you:  Currently smoke.  Have a history of at least 30 pack-years of smoking and you currently smoke or have quit within the past 15 years. A pack-year is smoking an average of one pack of cigarettes per day for one year. Yearly screening should:  Continue until it has been 15 years since you quit.  Stop if you develop a health problem that would prevent you from having lung cancer treatment. Colorectal Cancer  This type of cancer can be detected and can often be prevented.  Routine colorectal cancer screening usually begins at age 71 and continues through age 75.  If you have risk factors for colon cancer, your health care provider may recommend that you  be screened at an earlier age.  If you have a family history of colorectal cancer, talk with your health care provider about genetic screening.  Your health care provider may also recommend using home test kits to check for hidden blood in your stool.  A small camera at the end of a tube can be used to examine your colon directly (sigmoidoscopy or colonoscopy). This is done to check for the earliest forms of colorectal cancer.  Direct examination of the colon should be repeated every 5-10 years until age 10. However, if early forms of precancerous polyps or small growths are found or if you have a family history or genetic risk for colorectal cancer, you may need to be screened more often. Skin Cancer  Check your skin from head to toe regularly.  Monitor any moles. Be sure to tell your health care provider:  About any new moles or changes in moles, especially if there is a change in a mole's shape or color.  If you have a mole that is larger than the size of a pencil eraser.  If any of your family members has a history of skin cancer, especially at a young age, talk with your health care provider about genetic screening.  Always use sunscreen. Apply sunscreen liberally and repeatedly throughout the day.  Whenever you are outside, protect yourself by wearing long sleeves, pants, a wide-brimmed hat, and sunglasses. What should I  know about osteoporosis? Osteoporosis is a condition in which bone destruction happens more quickly than new bone creation. After menopause, you may be at an increased risk for osteoporosis. To help prevent osteoporosis or the bone fractures that can happen because of osteoporosis, the following is recommended:  If you are 41-29 years old, get at least 1,000 mg of calcium and at least 600 mg of vitamin D per day.  If you are older than age 60 but younger than age 10, get at least 1,200 mg of calcium and at least 600 mg of vitamin D per day.  If you are older than  age 44, get at least 1,200 mg of calcium and at least 800 mg of vitamin D per day. Smoking and excessive alcohol intake increase the risk of osteoporosis. Eat foods that are rich in calcium and vitamin D, and do weight-bearing exercises several times each week as directed by your health care provider. What should I know about how menopause affects my mental health? Depression may occur at any age, but it is more common as you become older. Common symptoms of depression include:  Low or sad mood.  Changes in sleep patterns.  Changes in appetite or eating patterns.  Feeling an overall lack of motivation or enjoyment of activities that you previously enjoyed.  Frequent crying spells. Talk with your health care provider if you think that you are experiencing depression. What should I know about immunizations? It is important that you get and maintain your immunizations. These include:  Tetanus, diphtheria, and pertussis (Tdap) booster vaccine.  Influenza every year before the flu season begins.  Pneumonia vaccine.  Shingles vaccine. Your health care provider may also recommend other immunizations. This information is not intended to replace advice given to you by your health care provider. Make sure you discuss any questions you have with your health care provider. Document Released: 01/07/2006 Document Revised: 06/04/2016 Document Reviewed: 08/19/2015  2017 [employer]

## 2017-01-14 NOTE — Assessment & Plan Note (Signed)
In a slight flare. Will call GI if not improving.

## 2017-01-15 LAB — COMPREHENSIVE METABOLIC PANEL
A/G RATIO: 1.8 (ref 1.2–2.2)
ALBUMIN: 4.1 g/dL (ref 3.5–5.5)
ALT: 26 IU/L (ref 0–32)
AST: 16 IU/L (ref 0–40)
Alkaline Phosphatase: 41 IU/L (ref 39–117)
BILIRUBIN TOTAL: 0.4 mg/dL (ref 0.0–1.2)
BUN / CREAT RATIO: 22 (ref 9–23)
BUN: 20 mg/dL (ref 6–24)
CALCIUM: 9.3 mg/dL (ref 8.7–10.2)
CHLORIDE: 102 mmol/L (ref 96–106)
CO2: 21 mmol/L (ref 18–29)
Creatinine, Ser: 0.89 mg/dL (ref 0.57–1.00)
GFR calc non Af Amer: 71 mL/min/{1.73_m2} (ref >59)
GFR, EST AFRICAN AMERICAN: 82 mL/min/{1.73_m2} (ref >59)
Globulin, Total: 2.3 g/dL (ref 1.5–4.5)
Glucose: 182 mg/dL — ABNORMAL HIGH (ref 65–99)
POTASSIUM: 4.5 mmol/L (ref 3.5–5.2)
Sodium: 143 mmol/L (ref 134–144)
TOTAL PROTEIN: 6.4 g/dL (ref 6.0–8.5)

## 2017-01-15 LAB — CBC WITH DIFFERENTIAL/PLATELET
Basophils Absolute: 0 10*3/uL (ref 0.0–0.2)
Basos: 0 %
EOS (ABSOLUTE): 0.3 10*3/uL (ref 0.0–0.4)
EOS: 4 %
HEMOGLOBIN: 14.8 g/dL (ref 11.1–15.9)
Hematocrit: 44.5 % (ref 34.0–46.6)
IMMATURE GRANS (ABS): 0 10*3/uL (ref 0.0–0.1)
Immature Granulocytes: 0 %
LYMPHS ABS: 1.7 10*3/uL (ref 0.7–3.1)
LYMPHS: 29 %
MCH: 29.5 pg (ref 26.6–33.0)
MCHC: 33.3 g/dL (ref 31.5–35.7)
MCV: 89 fL (ref 79–97)
MONOCYTES: 8 %
Monocytes Absolute: 0.4 10*3/uL (ref 0.1–0.9)
NEUTROS ABS: 3.3 10*3/uL (ref 1.4–7.0)
Neutrophils: 59 %
Platelets: 244 10*3/uL (ref 150–379)
RBC: 5.01 x10E6/uL (ref 3.77–5.28)
RDW: 14.9 % (ref 12.3–15.4)
WBC: 5.7 10*3/uL (ref 3.4–10.8)

## 2017-01-15 LAB — LIPID PANEL W/O CHOL/HDL RATIO
Cholesterol, Total: 128 mg/dL (ref 100–199)
HDL: 57 mg/dL (ref >39)
LDL Calculated: 42 mg/dL (ref 0–99)
Triglycerides: 146 mg/dL (ref 0–149)
VLDL CHOLESTEROL CAL: 29 mg/dL (ref 5–40)

## 2017-01-20 ENCOUNTER — Telehealth: Payer: Self-pay | Admitting: Family Medicine

## 2017-01-20 ENCOUNTER — Encounter: Payer: Self-pay | Admitting: Family Medicine

## 2017-01-20 LAB — MICROALBUMIN, URINE WAIVED
Creatinine, Urine Waived: 200 mg/dL (ref 10–300)
Microalb, Ur Waived: 80 mg/L — ABNORMAL HIGH (ref 0–19)

## 2017-01-20 LAB — UA/M W/RFLX CULTURE, ROUTINE
Bilirubin, UA: NEGATIVE
Ketones, UA: NEGATIVE
NITRITE UA: NEGATIVE
PH UA: 5 (ref 5.0–7.5)
Specific Gravity, UA: 1.025 (ref 1.005–1.030)
UUROB: 0.2 mg/dL (ref 0.2–1.0)

## 2017-01-20 LAB — MICROSCOPIC EXAMINATION

## 2017-01-20 LAB — URINE CULTURE, REFLEX

## 2017-01-20 MED ORDER — AMOXICILLIN 500 MG PO CAPS: 500.0000 mg | ORAL_CAPSULE | Freq: Two times a day (BID) | ORAL | 0 refills | Status: DC

## 2017-01-20 NOTE — Telephone Encounter (Signed)
Phone call Discussed with patient UTI will start Amoxil.

## 2017-01-27 ENCOUNTER — Ambulatory Visit (INDEPENDENT_AMBULATORY_CARE_PROVIDER_SITE_OTHER): Payer: 59 | Admitting: Family Medicine

## 2017-01-27 ENCOUNTER — Encounter: Payer: Self-pay | Admitting: Family Medicine

## 2017-01-27 VITALS — BP 124/78 | HR 102 | Temp 99.3°F | Wt 219.0 lb

## 2017-01-27 DIAGNOSIS — R1013 Epigastric pain: Secondary | ICD-10-CM

## 2017-01-27 MED ORDER — CIPROFLOXACIN HCL 500 MG PO TABS: 500.0000 mg | ORAL_TABLET | Freq: Two times a day (BID) | ORAL | 0 refills | Status: DC

## 2017-01-27 MED ORDER — ONDANSETRON 4 MG PO TBDP: 4.0000 mg | ORAL_TABLET | Freq: Three times a day (TID) | ORAL | 0 refills | Status: DC | PRN

## 2017-01-27 NOTE — Progress Notes (Signed)
BP 124/78   Pulse (!) 102   Temp 99.3 F (37.4 C)   Wt 219 lb (99.3 kg)   LMP 03/17/2013 (Approximate)   SpO2 96%   BMI 40.71 kg/m    Subjective:    Patient ID: Sierra Copeland, female    DOB: 11/12/1956, 61 y.o.   MRN: [id number]  HPI: Sierra Copeland is a 61 y.o. female  Chief Complaint  Patient presents with  . Abdominal Pain    Tues am, felt like she had alot of gas pressure in her abdomen and back, it is improved some, but still there. Tuesday evening she started running a low grade temp. Some dry heaves and loose bowels but not diarrhea.    Back pain and bloating  Patient with a history of spinal stenosis, colitis, and recent UTI treated with Amoxil on 01/21/16 presents to the clinic for midthoracic and lumbar back pain and abdominal bloating x 2 days. Associated symptoms include fever/chills of 100.4 and nausea. Denies dysuria, hematuria, urinary frequency, vomiting, hemoptysis, constipation, diarrhea, rectal bleeding, chest pain, SOB. She took OTC gas medication and Alka Seltzer with relief and has been taking Tramadol for pain relief. Symptoms not worsened with eating or position. She has been trying to stay hydrated. Last BM 2 days ago that were loose. Patient has had a UTI twice in the last year since starting Farxiga. She had one episode in the past of similar complaints and states it resolved on its own. No history of gall stones, kidney stones, excessive alcohol consumption.   Past Medical History:  Diagnosis Date  . Abnormal mammogram   . Arthritis   . Back pain    spinal stenosis and neck bone spur  . Car sickness   . Collagenous colitis   . Depression   . Diabetes mellitus without complication ([place])   . GERD (gastroesophageal reflux disease)    occ-no meds  . Hair loss    TAKING THYROTAIN  . Headache    migraines  . Hyperlipemia   . Hypertension   . Osteoarthritis    seen by Rheum, not enoug evidence to support RA  . Pinched nerve   . PONV (postoperative  nausea and vomiting)   . Rosacea   . Seborrheic dermatitis   . Sleep apnea   . Wears glasses    Social History   Social History  . Marital status: Married    Spouse name: N/A  . Number of children: N/A  . Years of education: N/A   Occupational History  . Not on file.   Social History Main Topics  . Smoking status: Former Smoker    Packs/day: 1.00    Years: 10.00    Types: Cigarettes    Quit date: 09/28/1986  . Smokeless tobacco: Never Used  . Alcohol use No  . Drug use: No  . Sexual activity: Yes   Other Topics Concern  . Not on file   Social History Narrative  . No narrative on file    Relevant past medical, surgical, family and social history reviewed and updated as indicated. Interim medical history since our last visit reviewed. Allergies and medications reviewed and updated.  Review of Systems  Constitutional: Positive for chills and fever. Negative for unexpected weight change.  HENT: Negative for congestion, sore throat and trouble swallowing.   Eyes: Negative.   Respiratory: Negative for cough, choking, chest tightness and shortness of breath.   Cardiovascular: Negative for chest pain and palpitations.  Gastrointestinal:  Positive for abdominal distention, abdominal pain and nausea. Negative for constipation, diarrhea, rectal pain and vomiting.  Genitourinary: Negative for decreased urine volume, difficulty urinating, dysuria, flank pain, frequency, hematuria, pelvic pain and urgency.  Musculoskeletal: Positive for back pain. Negative for gait problem and myalgias.  Skin: Negative for rash.    Per HPI unless specifically indicated above     Objective:    BP 124/78   Pulse (!) 102   Temp 99.3 F (37.4 C)   Wt 219 lb (99.3 kg)   LMP 03/17/2013 (Approximate)   SpO2 96%   BMI 40.71 kg/m   Wt Readings from Last 3 Encounters:  01/27/17 219 lb (99.3 kg)  01/14/17 221 lb (100.2 kg)  09/29/16 221 lb (100.2 kg)    Physical Exam  Constitutional: She  is oriented to person, place, and time. She appears well-developed and well-nourished. No distress.  HENT:  Head: Normocephalic and atraumatic.  Eyes: Conjunctivae are normal. Right eye exhibits no discharge. Left eye exhibits no discharge. No scleral icterus.  Neck: Normal range of motion. Neck supple. No JVD present.  Cardiovascular: Normal rate, regular rhythm, normal heart sounds and intact distal pulses.   Pulmonary/Chest: Effort normal and breath sounds normal. No respiratory distress. She has no wheezes. She has no rales.  Abdominal: Soft. Bowel sounds are normal. There is no CVA tenderness.  Mild epigastric tenderness to palpation. No distension, masses, guarding, rigidity, rebound.   Musculoskeletal: Normal range of motion.  Mild midthoracic and lumbar spinal tenderness to palpation. No paraspinal tenderness, no step offs.   Neurological: She is alert and oriented to person, place, and time.  Skin: Skin is warm and dry.  Psychiatric: She has a normal mood and affect. Her behavior is normal. Judgment and thought content normal.      Assessment & Plan:   Problem List Items Addressed This Visit    None    Visit Diagnoses    Epigastric pain    -  Primary   Likely residual from recent abx for UTI, but given fevers and persistent sxs will treat with cipro in case any residual UTI or colitis. Start probiotics daily   Relevant Orders   CBC With Differential/Platelet (Completed)   UA/M w/rflx Culture, Routine (Completed)    Strict return precautions given. CBC and U/A unconcerning today.    Follow up plan: Return if symptoms worsen or fail to improve.

## 2017-01-27 NOTE — Progress Notes (Deleted)
   BP 124/78   Pulse (!) 102   Temp 99.3 F (37.4 C)   Wt 219 lb (99.3 kg)   LMP 03/17/2013 (Approximate)   SpO2 96%   BMI 40.71 kg/m    Subjective:    Patient ID: Sierra Copeland, female    DOB: 1956-09-22, 61 y.o.   MRN: [id number]  HPI: Sierra Copeland is a 61 y.o. female  Chief Complaint  Patient presents with  . Abdominal Pain    Tues am, felt like she had alot of gas pressure in her abdomen and back, it is improved some, but still there. Tuesday evening she started running a low grade temp. Some dry heaves and loose bowels but not diarrhea.    Patient presents with 2-3 days of bloating, nausea, dry heaves with clear fluid coming up, has had a fever off and on. T max 100.4, sweats, flushed. Recently completed amoxicillin for UTI. Has recently been eating more yogurt, gas relief, and alka seltzer with some relief. Pain wraps around to back. Denies dysuria, bloody in stools or urine.   Relevant past medical, surgical, family and social history reviewed and updated as indicated. Interim medical history since our last visit reviewed. Allergies and medications reviewed and updated.  Review of Systems  Constitutional: Positive for chills, diaphoresis and fever.  HENT: Negative.   Eyes: Negative.   Respiratory: Negative.   Cardiovascular: Negative.   Gastrointestinal: Positive for abdominal pain, nausea and vomiting.  Genitourinary: Negative.   Musculoskeletal: Negative.   Neurological: Negative.   Psychiatric/Behavioral: Negative.     Per HPI unless specifically indicated above     Objective:    BP 124/78   Pulse (!) 102   Temp 99.3 F (37.4 C)   Wt 219 lb (99.3 kg)   LMP 03/17/2013 (Approximate)   SpO2 96%   BMI 40.71 kg/m   Wt Readings from Last 3 Encounters:  01/27/17 219 lb (99.3 kg)  01/14/17 221 lb (100.2 kg)  09/29/16 221 lb (100.2 kg)    Physical Exam  Constitutional: She is oriented to person, place, and time. She appears well-developed and  well-nourished. No distress.  HENT:  Head: Atraumatic.  Eyes: Conjunctivae are normal. Pupils are equal, round, and reactive to light.  Neck: Normal range of motion. Neck supple.  Cardiovascular: Normal rate and normal heart sounds.   Pulmonary/Chest: Effort normal and breath sounds normal. No respiratory distress.  Abdominal: Soft. Bowel sounds are normal. She exhibits no distension. There is tenderness (Minimal diffuse ttp). There is no guarding.  Musculoskeletal: Normal range of motion.  No CVA tenderness  Neurological: She is alert and oriented to person, place, and time.  Skin: Skin is warm and dry.  Psychiatric: She has a normal mood and affect. Her behavior is normal.  Nursing note and vitals reviewed.     Assessment & Plan:   Problem List Items Addressed This Visit    None       Follow up plan: No Follow-up on file.

## 2017-01-28 LAB — CBC WITH DIFFERENTIAL/PLATELET
Hematocrit: 45 % (ref 34.0–46.6)
Hemoglobin: 15.1 g/dL (ref 11.1–15.9)
LYMPHS ABS: 1.2 10*3/uL (ref 0.7–3.1)
LYMPHS: 12 %
MCH: 30 pg (ref 26.6–33.0)
MCHC: 33.6 g/dL (ref 31.5–35.7)
MCV: 90 fL (ref 79–97)
MID (ABSOLUTE): 0.9 10*3/uL (ref 0.1–1.6)
MID: 9 %
Neutrophils Absolute: 8.2 10*3/uL — ABNORMAL HIGH (ref 1.4–7.0)
Neutrophils: 19 %
PLATELETS: 196 10*3/uL (ref 150–379)
RBC: 5.03 x10E6/uL (ref 3.77–5.28)
RDW: 15.3 % (ref 12.3–15.4)
WBC: 10.3 10*3/uL (ref 3.4–10.8)

## 2017-01-28 LAB — UA/M W/RFLX CULTURE, ROUTINE
BILIRUBIN UA: NEGATIVE
Leukocytes, UA: NEGATIVE
NITRITE UA: NEGATIVE
Specific Gravity, UA: 1.025 (ref 1.005–1.030)
UUROB: 1 mg/dL (ref 0.2–1.0)
pH, UA: 6 (ref 5.0–7.5)

## 2017-01-28 LAB — MICROSCOPIC EXAMINATION: RBC MICROSCOPIC, UA: NONE SEEN /HPF (ref 0–?)

## 2017-01-31 NOTE — Patient Instructions (Signed)
Follow up if worsening or no improvement 

## 2017-02-27 LAB — HM HEPATITIS C SCREENING LAB: HM HEPATITIS C SCREENING: NEGATIVE

## 2017-03-29 DIAGNOSIS — C50919 Malignant neoplasm of unspecified site of unspecified female breast: Secondary | ICD-10-CM | POA: Insufficient documentation

## 2017-05-02 LAB — HEMOGLOBIN A1C: HEMOGLOBIN A1C: 7.8

## 2017-06-10 ENCOUNTER — Encounter: Payer: Self-pay | Admitting: Family Medicine

## 2017-06-10 ENCOUNTER — Ambulatory Visit (INDEPENDENT_AMBULATORY_CARE_PROVIDER_SITE_OTHER): Payer: 59 | Admitting: Family Medicine

## 2017-06-10 VITALS — BP 145/79 | HR 83 | Temp 98.7°F | Wt 226.0 lb

## 2017-06-10 DIAGNOSIS — R140 Abdominal distension (gaseous): Secondary | ICD-10-CM | POA: Diagnosis not present

## 2017-06-10 LAB — UA/M W/RFLX CULTURE, ROUTINE
Bilirubin, UA: NEGATIVE
GLUCOSE, UA: NEGATIVE
Leukocytes, UA: NEGATIVE
NITRITE UA: NEGATIVE
SPEC GRAV UA: 1.02 (ref 1.005–1.030)
UUROB: 0.2 mg/dL (ref 0.2–1.0)
pH, UA: 5.5 (ref 5.0–7.5)

## 2017-06-10 LAB — MICROSCOPIC EXAMINATION

## 2017-06-10 NOTE — Progress Notes (Signed)
BP (!) 145/79   Pulse 83   Temp 98.7 F (37.1 C)   Wt 226 lb (102.5 kg)   LMP 03/17/2013 (Approximate)   SpO2 97%   BMI 42.01 kg/m    Subjective:    Patient ID: Sierra Copeland, female    DOB: [birth year], 61 y.o.   MRN: [id number]  HPI: Sierra Copeland is a 61 y.o. female  Chief Complaint  Patient presents with  . Urinary Tract Infection    x 6 days, some low back pain. No burning, no frequency or urgency. Did have some upset stomach and pain that went from her abdomen to her back. Is on Augmentin for a sinus infection and uti.   Patient presents today for UC f/u for sinusitis and UTI. Started on augmentin about 6 days ago and feeling better for the most part, but still having a small amount of sinus and ear pressure and dealing with persistent bloating, nausea, and diarrhea. Taking mucinex and tylenol sinus as well as the augmentin. Has taken some imodium every once in a while for the diarrhea. States these abdominal sxs seem to be fairly ongoing for her and she can't pinpoint a cause. Denies fever, chills, melena, abdominal pain, inability to tolerate PO.   Relevant past medical, surgical, family and social history reviewed and updated as indicated. Interim medical history since our last visit reviewed. Allergies and medications reviewed and updated.  Review of Systems  Constitutional: Negative.   HENT: Positive for ear pain and sinus pressure.   Respiratory: Negative.   Cardiovascular: Negative.   Gastrointestinal: Positive for diarrhea and nausea.       Bloating  Genitourinary: Negative for dysuria, flank pain, hematuria and urgency.  Musculoskeletal: Negative.   Neurological: Negative.   Psychiatric/Behavioral: Negative.    Per HPI unless specifically indicated above     Objective:    BP (!) 145/79   Pulse 83   Temp 98.7 F (37.1 C)   Wt 226 lb (102.5 kg)   LMP 03/17/2013 (Approximate)   SpO2 97%   BMI 42.01 kg/m   Wt Readings from Last 3 Encounters:  06/10/17  226 lb (102.5 kg)  01/27/17 219 lb (99.3 kg)  01/14/17 221 lb (100.2 kg)    Physical Exam  Constitutional: She is oriented to person, place, and time. She appears well-developed and well-nourished.  HENT:  Head: Atraumatic.  Eyes: Pupils are equal, round, and reactive to light. Conjunctivae are normal.  Neck: Normal range of motion. Neck supple.  Cardiovascular: Normal rate and normal heart sounds.   Pulmonary/Chest: Effort normal and breath sounds normal. No respiratory distress.  Abdominal: Soft. Bowel sounds are normal. She exhibits no distension and no mass. There is no tenderness. There is no guarding.  Musculoskeletal: Normal range of motion. She exhibits no tenderness (No CVA tenderness b/l).  Neurological: She is alert and oriented to person, place, and time.  Skin: Skin is warm and dry.  Psychiatric: She has a normal mood and affect. Her behavior is normal.  Nursing note and vitals reviewed.     Assessment & Plan:   Problem List Items Addressed This Visit    None    Visit Diagnoses    Abdominal bloating    -  Primary   Relevant Orders   UA/M w/rflx Culture, Routine (STAT)      U/A negative, sinus sxs appear to be resolving slowly. Continue mucinex, sinus rinses, vapor rubs. Complete remaining augmentin tablets. Discussed possibly cutting out dairy  or gluten for a few weeks to see if a mild food intolerance may be causing her persistent bloating, nausea, diarrhea. Can take simethicone and imodium prn, push fluids. Return precautions reviewed.   Follow up plan: Return if symptoms worsen or fail to improve.

## 2017-07-06 ENCOUNTER — Ambulatory Visit (INDEPENDENT_AMBULATORY_CARE_PROVIDER_SITE_OTHER): Payer: 59 | Admitting: Family Medicine

## 2017-07-06 ENCOUNTER — Encounter: Payer: Self-pay | Admitting: Family Medicine

## 2017-07-06 VITALS — BP 137/77 | HR 69 | Temp 98.7°F | Wt 224.0 lb

## 2017-07-06 DIAGNOSIS — R1012 Left upper quadrant pain: Secondary | ICD-10-CM

## 2017-07-06 MED ORDER — SUCRALFATE 1 G PO TABS: 1.0000 g | ORAL_TABLET | Freq: Three times a day (TID) | ORAL | 1 refills | Status: DC

## 2017-07-06 NOTE — Progress Notes (Signed)
BP 137/77   Pulse 69   Temp 98.7 F (37.1 C)   Wt 224 lb (101.6 kg)   LMP 03/17/2013 (Approximate)   SpO2 97%   BMI 41.64 kg/m    Subjective:    Patient ID: Sierra Copeland, female    DOB: [birth year], 61 y.o.   MRN: [id number]  HPI: Sierra Copeland is a 61 y.o. female  Chief Complaint  Patient presents with  . Abdominal Pain    Monday had indigestion, yesterday she had tan colored stools, still having discolored stool, not firm in texture. Sudden urges to go to the bathroom. No nausea or vomiting.   Patient presents with several days of indigestion, tan colored stools, fecal urgency, and LUQ discomfort. Denies fever, chills, N/V. Has had chronic abdominal pains similar to this for years, awaiting GI consult for further evaluation. Has not been trying much OTC for sxs. Tolerating PO fine.   Relevant past medical, surgical, family and social history reviewed and updated as indicated. Interim medical history since our last visit reviewed. Allergies and medications reviewed and updated.  Review of Systems  Constitutional: Negative.   HENT: Negative.   Respiratory: Negative.   Cardiovascular: Negative.   Gastrointestinal: Positive for abdominal pain and diarrhea.  Genitourinary: Negative.   Musculoskeletal: Negative.   Neurological: Negative.   Psychiatric/Behavioral: Negative.    Per HPI unless specifically indicated above     Objective:    BP 137/77   Pulse 69   Temp 98.7 F (37.1 C)   Wt 224 lb (101.6 kg)   LMP 03/17/2013 (Approximate)   SpO2 97%   BMI 41.64 kg/m   Wt Readings from Last 3 Encounters:  07/06/17 224 lb (101.6 kg)  06/10/17 226 lb (102.5 kg)  01/27/17 219 lb (99.3 kg)    Physical Exam  Constitutional: She is oriented to person, place, and time. She appears well-developed and well-nourished. No distress.  HENT:  Head: Atraumatic.  Eyes: Conjunctivae are normal.  Neck: Normal range of motion. Neck supple.  Cardiovascular: Normal rate and normal  heart sounds.   Pulmonary/Chest: Effort normal. No respiratory distress.  Abdominal: Soft. Bowel sounds are normal. There is tenderness (mild ttp in LUQ and surrounding areas). There is no rebound and no guarding.  Musculoskeletal: Normal range of motion.  Neurological: She is alert and oriented to person, place, and time.  Skin: Skin is warm and dry.  Psychiatric: She has a normal mood and affect. Her behavior is normal.  Nursing note and vitals reviewed.  Results for orders placed or performed in visit on 07/06/17  Comprehensive metabolic panel  Result Value Ref Range   Glucose 197 (H) 65 - 99 mg/dL   BUN 10 6 - 24 mg/dL   Creatinine, Ser 0.86 0.57 - 1.00 mg/dL   GFR calc non Af Amer 74 >59 mL/min/1.73   GFR calc Af Amer 86 >59 mL/min/1.73   BUN/Creatinine Ratio 12 9 - 23   Sodium 141 134 - 144 mmol/L   Potassium 4.4 3.5 - 5.2 mmol/L   Chloride 100 96 - 106 mmol/L   CO2 23 20 - 29 mmol/L   Calcium 9.2 8.7 - 10.2 mg/dL   Total Protein 6.7 6.0 - 8.5 g/dL   Albumin 3.8 3.5 - 5.5 g/dL   Globulin, Total 2.9 1.5 - 4.5 g/dL   Albumin/Globulin Ratio 1.3 1.2 - 2.2   Bilirubin Total 0.4 0.0 - 1.2 mg/dL   Alkaline Phosphatase 122 (H) 39 - 117 IU/L  AST 35 0 - 40 IU/L   ALT 133 (H) 0 - 32 IU/L      Assessment & Plan:   Problem List Items Addressed This Visit    None    Visit Diagnoses    LUQ abdominal pain    -  Primary   Await CMP, vitals stable and sxs mild at the current time. Recommended moving GI appt to sooner than 1 month out. Carafate and zofran prn.    Relevant Orders   Comprehensive metabolic panel (Completed)       Follow up plan: Return for GI appt.

## 2017-07-07 ENCOUNTER — Telehealth: Payer: Self-pay | Admitting: Family Medicine

## 2017-07-07 DIAGNOSIS — R7989 Other specified abnormal findings of blood chemistry: Secondary | ICD-10-CM

## 2017-07-07 DIAGNOSIS — R945 Abnormal results of liver function studies: Principal | ICD-10-CM

## 2017-07-07 LAB — COMPREHENSIVE METABOLIC PANEL
A/G RATIO: 1.3 (ref 1.2–2.2)
ALBUMIN: 3.8 g/dL (ref 3.5–5.5)
ALT: 133 IU/L — ABNORMAL HIGH (ref 0–32)
AST: 35 IU/L (ref 0–40)
Alkaline Phosphatase: 122 IU/L — ABNORMAL HIGH (ref 39–117)
BILIRUBIN TOTAL: 0.4 mg/dL (ref 0.0–1.2)
BUN / CREAT RATIO: 12 (ref 9–23)
BUN: 10 mg/dL (ref 6–24)
CHLORIDE: 100 mmol/L (ref 96–106)
CO2: 23 mmol/L (ref 20–29)
Calcium: 9.2 mg/dL (ref 8.7–10.2)
Creatinine, Ser: 0.86 mg/dL (ref 0.57–1.00)
GFR calc Af Amer: 86 mL/min/{1.73_m2} (ref >59)
GFR calc non Af Amer: 74 mL/min/{1.73_m2} (ref >59)
GLUCOSE: 197 mg/dL — AB (ref 65–99)
Globulin, Total: 2.9 g/dL (ref 1.5–4.5)
POTASSIUM: 4.4 mmol/L (ref 3.5–5.2)
SODIUM: 141 mmol/L (ref 134–144)
Total Protein: 6.7 g/dL (ref 6.0–8.5)

## 2017-07-07 NOTE — Telephone Encounter (Signed)
Please call and let her know her labs came back normal other than an elevated liver enzyme and alkaline phosphatase - if she could come in to get more blood drawn in about a week, then if she isn't already lined up for a GI appt soon I will put in a referral and try to get her seen just to look into her pain and these lab changes for her  Ask her to avoid alcohol and tylenol and other liver irritants

## 2017-07-07 NOTE — Telephone Encounter (Signed)
Patient notified of results. She currently has an appt with GI on the 28th but she is on a cancellation list. She has appt here next week and will get those labs checked then.

## 2017-07-08 NOTE — Patient Instructions (Signed)
Follow up as needed

## 2017-07-12 ENCOUNTER — Encounter: Payer: Self-pay | Admitting: Family Medicine

## 2017-07-12 ENCOUNTER — Ambulatory Visit (INDEPENDENT_AMBULATORY_CARE_PROVIDER_SITE_OTHER): Payer: 59 | Admitting: Family Medicine

## 2017-07-12 VITALS — BP 136/83 | HR 90 | Temp 98.5°F | Wt 223.1 lb

## 2017-07-12 DIAGNOSIS — K52831 Collagenous colitis: Secondary | ICD-10-CM

## 2017-07-12 NOTE — Progress Notes (Signed)
BP 136/83 (BP Location: Right Arm, Patient Position: Sitting, Cuff Size: Large)   Pulse 90   Temp 98.5 F (36.9 C)   Wt 223 lb 1 oz (101.2 kg)   LMP 03/17/2013 (Approximate)   SpO2 96%   BMI 41.46 kg/m    Subjective:    Patient ID: Sierra Copeland, female    DOB: [birth year], 61 y.o.   MRN: [id number]  HPI: Sierra Copeland is a 61 y.o. female  Chief Complaint  Patient presents with  . FMLA Paperwork   Here today for paperwork for FMLA to be filled out. Known colitis. Out up to 10x a year. Has had to go out 2x this year for colitis. Currently undergoing treatment for breast cancer. Out on short term disability for that- new form looks like return to work for that short term disability. She will see if her oncologist can fill it out.   Stool is improving. Belly pain is better. No other concerns or complaints at this time.   Relevant past medical, surgical, family and social history reviewed and updated as indicated. Interim medical history since our last visit reviewed. Allergies and medications reviewed and updated.  Review of Systems  Constitutional: Negative.   Respiratory: Negative.   Cardiovascular: Negative.   Gastrointestinal: Negative.   Psychiatric/Behavioral: Negative.     Per HPI unless specifically indicated above     Objective:    BP 136/83 (BP Location: Right Arm, Patient Position: Sitting, Cuff Size: Large)   Pulse 90   Temp 98.5 F (36.9 C)   Wt 223 lb 1 oz (101.2 kg)   LMP 03/17/2013 (Approximate)   SpO2 96%   BMI 41.46 kg/m   Wt Readings from Last 3 Encounters:  07/12/17 223 lb 1 oz (101.2 kg)  07/06/17 224 lb (101.6 kg)  06/10/17 226 lb (102.5 kg)    Physical Exam  Constitutional: She is oriented to person, place, and time. She appears well-developed and well-nourished. No distress.  HENT:  Head: Normocephalic and atraumatic.  Right Ear: Hearing normal.  Left Ear: Hearing normal.  Nose: Nose normal.  Eyes: Conjunctivae and lids are normal.  Right eye exhibits no discharge. Left eye exhibits no discharge. No scleral icterus.  Cardiovascular: Normal rate, regular rhythm, normal heart sounds and intact distal pulses.  Exam reveals no gallop and no friction rub.   No murmur heard. Pulmonary/Chest: Effort normal and breath sounds normal. No respiratory distress. She has no wheezes. She has no rales. She exhibits no tenderness.  Abdominal: Soft. Bowel sounds are normal. She exhibits no distension and no mass. There is no tenderness. There is no rebound and no guarding.  Musculoskeletal: Normal range of motion.  Neurological: She is alert and oriented to person, place, and time.  Skin: Skin is intact. No rash noted. She is not diaphoretic.  Psychiatric: She has a normal mood and affect. Her speech is normal and behavior is normal. Judgment and thought content normal. Cognition and memory are normal.  Nursing note and vitals reviewed.   Results for orders placed or performed in visit on 07/12/17  [facility] HEPATITIS C SCREENING LAB  Result Value Ref Range   [facility] Hepatitis Screen Negative - Patient Reported       Assessment & Plan:   Problem List Items Addressed This Visit      Digestive   Collagenous colitis - Primary    Stable. FMLA paperwork filled out. Call with any concerns. Continue to follow with GI.  Follow up plan: Return As scheduled.

## 2017-07-12 NOTE — Assessment & Plan Note (Signed)
Stable. FMLA paperwork filled out. Call with any concerns. Continue to follow with GI.

## 2017-07-17 ENCOUNTER — Other Ambulatory Visit: Payer: Self-pay | Admitting: Family Medicine

## 2017-07-19 ENCOUNTER — Ambulatory Visit: Payer: 59 | Admitting: Family Medicine

## 2017-07-26 ENCOUNTER — Other Ambulatory Visit: Payer: Self-pay | Admitting: Gastroenterology

## 2017-07-26 DIAGNOSIS — R945 Abnormal results of liver function studies: Secondary | ICD-10-CM

## 2017-07-26 DIAGNOSIS — R1013 Epigastric pain: Secondary | ICD-10-CM

## 2017-07-26 DIAGNOSIS — R7989 Other specified abnormal findings of blood chemistry: Secondary | ICD-10-CM

## 2017-07-26 LAB — CBC AND DIFFERENTIAL
HEMATOCRIT: 38 (ref 36–46)
Hemoglobin: 12.6 (ref 12.0–16.0)
Hemoglobin: 12.6 (ref 12.0–16.0)
PLATELETS: 240 (ref 150–399)
WBC: 6.1
WBC: 6.1

## 2017-07-26 LAB — HEPATIC FUNCTION PANEL
ALK PHOS: 49 (ref 25–125)
ALT: 13 (ref 7–35)
AST: 12 — AB (ref 13–35)
Bilirubin, Total: 0.4

## 2017-07-26 LAB — BASIC METABOLIC PANEL
BUN: 14 (ref 4–21)
Creatinine: 0.8 (ref 0.5–1.1)
GLUCOSE: 132
POTASSIUM: 4.6 (ref 3.4–5.3)
SODIUM: 141 (ref 137–147)

## 2017-07-28 ENCOUNTER — Ambulatory Visit
Admission: RE | Admit: 2017-07-28 | Discharge: 2017-07-28 | Disposition: A | Discharge status: Home / Self Care | Payer: 59 | Source: Ambulatory Visit | Attending: Gastroenterology | Admitting: Gastroenterology

## 2017-07-28 DIAGNOSIS — R1013 Epigastric pain: Secondary | ICD-10-CM | POA: Diagnosis not present

## 2017-07-28 DIAGNOSIS — R7989 Other specified abnormal findings of blood chemistry: Secondary | ICD-10-CM

## 2017-07-28 DIAGNOSIS — K8020 Calculus of gallbladder without cholecystitis without obstruction: Secondary | ICD-10-CM | POA: Diagnosis not present

## 2017-07-28 DIAGNOSIS — K838 Other specified diseases of biliary tract: Secondary | ICD-10-CM | POA: Diagnosis not present

## 2017-07-28 DIAGNOSIS — R945 Abnormal results of liver function studies: Secondary | ICD-10-CM | POA: Diagnosis present

## 2017-07-28 DIAGNOSIS — R932 Abnormal findings on diagnostic imaging of liver and biliary tract: Secondary | ICD-10-CM | POA: Insufficient documentation

## 2017-08-03 ENCOUNTER — Encounter: Payer: Self-pay | Admitting: Family Medicine

## 2017-08-03 ENCOUNTER — Ambulatory Visit (INDEPENDENT_AMBULATORY_CARE_PROVIDER_SITE_OTHER): Payer: 59 | Admitting: Family Medicine

## 2017-08-03 VITALS — BP 133/82 | HR 92 | Temp 98.5°F | Wt 221.5 lb

## 2017-08-03 DIAGNOSIS — F3342 Major depressive disorder, recurrent, in full remission: Secondary | ICD-10-CM | POA: Diagnosis not present

## 2017-08-03 DIAGNOSIS — E7800 Pure hypercholesterolemia, unspecified: Secondary | ICD-10-CM | POA: Diagnosis not present

## 2017-08-03 DIAGNOSIS — I129 Hypertensive chronic kidney disease with stage 1 through stage 4 chronic kidney disease, or unspecified chronic kidney disease: Secondary | ICD-10-CM | POA: Diagnosis not present

## 2017-08-03 DIAGNOSIS — E1165 Type 2 diabetes mellitus with hyperglycemia: Secondary | ICD-10-CM | POA: Diagnosis not present

## 2017-08-03 DIAGNOSIS — Z794 Long term (current) use of insulin: Secondary | ICD-10-CM | POA: Diagnosis not present

## 2017-08-03 DIAGNOSIS — R5383 Other fatigue: Secondary | ICD-10-CM | POA: Diagnosis not present

## 2017-08-03 LAB — MICROALBUMIN, URINE WAIVED
Creatinine, Urine Waived: 200 mg/dL (ref 10–300)
MICROALB, UR WAIVED: 80 mg/L — AB (ref 0–19)

## 2017-08-03 MED ORDER — LISINOPRIL 20 MG PO TABS: 20.0000 mg | ORAL_TABLET | Freq: Every day | ORAL | 3 refills | Status: DC

## 2017-08-03 NOTE — Assessment & Plan Note (Signed)
Followed by endocrine. Seeing them later this month. Stable. Continue to monitor.

## 2017-08-03 NOTE — Assessment & Plan Note (Signed)
Under good control. Continue current regimen. Continue to monitor. Call with any concerns. 

## 2017-08-03 NOTE — Progress Notes (Signed)
BP 133/82 (BP Location: Right Arm, Patient Position: Sitting, Cuff Size: Large)   Pulse 92   Temp 98.5 F (36.9 C)   Wt 221 lb 8 oz (100.5 kg)   LMP 03/17/2013 (Approximate)   SpO2 97%   BMI 41.17 kg/m    Subjective:    Patient ID: Sierra Copeland, female    DOB: 1956/07/23, 61 y.o.   MRN: [id number]  HPI: Sierra Copeland is a 61 y.o. female  Chief Complaint  Patient presents with  . Diabetes  . Depression  . Hyperlipidemia  . Hypertension   HYPERTENSION / [place] Satisfied with current treatment? yes Duration of hypertension: chronic BP monitoring frequency: not checking BP medication side effects: no Past BP meds: lisinopril Duration of hyperlipidemia: chronic Cholesterol medication side effects: no Cholesterol supplements: none Past cholesterol medications: none Medication compliance: excellent compliance Aspirin: no Recent stressors: no Recurrent headaches: no Visual changes: no Palpitations: no Dyspnea: no Chest pain: no Lower extremity edema: no Dizzy/lightheaded: no  DIABETES Hypoglycemic episodes:no Polydipsia/polyuria: no Visual disturbance: no Chest pain: no Paresthesias: no Glucose Monitoring: yes  Accucheck frequency: Daily  Fasting glucose: 150s Taking Insulin?: yes Blood Pressure Monitoring: not checking Retinal Examination: Up to Date Foot Exam: Up to Date Diabetic Education: Completed Pneumovax: Up to Date Influenza: Up to Date Aspirin: no  DEPRESSION Mood status: controlled Satisfied with current treatment?: yes Symptom severity: mild  Duration of current treatment : chronic Side effects: no Medication compliance: excellent compliance Psychotherapy/counseling: no  Previous psychiatric medications: paxil Depressed mood: no Anxious mood: no Anhedonia: no Significant weight loss or gain: no Insomnia: no  Fatigue: yes Feelings of worthlessness or guilt: no Impaired concentration/indecisiveness: no Suicidal ideations:  no Hopelessness: no Crying spells: no Depression screen [facility] 2/9 08/03/2017 07/12/2017 01/14/2017 07/14/2016 09/03/2015  Decreased Interest 0 0 0 0 0  Down, Depressed, Hopeless 0 0 0 0 0  PHQ - 2 Score 0 0 0 0 0  Altered sleeping 1 1 - 0 -  Tired, decreased energy 1 1 - 0 -  Change in appetite 0 0 - 0 -  Feeling bad or failure about yourself  0 0 - 0 -  Trouble concentrating 0 0 - 0 -  Moving slowly or fidgety/restless 0 0 - 0 -  Suicidal thoughts 0 0 - 0 -  PHQ-9 Score 2 2 - 0 -  Difficult doing work/chores - Not difficult at all - - -    Relevant past medical, surgical, family and social history reviewed and updated as indicated. Interim medical history since our last visit reviewed. Allergies and medications reviewed and updated.  Review of Systems  Constitutional: Positive for fatigue. Negative for activity change, appetite change, chills, diaphoresis, fever and unexpected weight change.  Respiratory: Negative.   Cardiovascular: Negative.   Musculoskeletal: Negative.   Psychiatric/Behavioral: Negative.     Per HPI unless specifically indicated above     Objective:    BP 133/82 (BP Location: Right Arm, Patient Position: Sitting, Cuff Size: Large)   Pulse 92   Temp 98.5 F (36.9 C)   Wt 221 lb 8 oz (100.5 kg)   LMP 03/17/2013 (Approximate)   SpO2 97%   BMI 41.17 kg/m   Wt Readings from Last 3 Encounters:  08/03/17 221 lb 8 oz (100.5 kg)  07/12/17 223 lb 1 oz (101.2 kg)  07/06/17 224 lb (101.6 kg)    Physical Exam  Constitutional: She is oriented to person, place, and time. She appears well-developed and  well-nourished. No distress.  HENT:  Head: Normocephalic and atraumatic.  Right Ear: Hearing normal.  Left Ear: Hearing normal.  Nose: Nose normal.  Eyes: Conjunctivae and lids are normal. Right eye exhibits no discharge. Left eye exhibits no discharge. No scleral icterus.  Cardiovascular: Normal rate, regular rhythm, normal heart sounds and intact distal pulses.   Exam reveals no gallop and no friction rub.   No murmur heard. Pulmonary/Chest: Effort normal and breath sounds normal. No respiratory distress. She has no wheezes. She has no rales. She exhibits no tenderness.  Musculoskeletal: Normal range of motion.  Neurological: She is alert and oriented to person, place, and time.  Skin: Skin is warm, dry and intact. No rash noted. She is not diaphoretic. No erythema. No pallor.  Psychiatric: She has a normal mood and affect. Her speech is normal and behavior is normal. Judgment and thought content normal. Cognition and memory are normal.  Nursing note and vitals reviewed.   Results for orders placed or performed in visit on 08/03/17  CBC and differential  Result Value Ref Range   Hemoglobin 12.6 12.0 - 16.0   WBC 6.1   Basic metabolic panel  Result Value Ref Range   Glucose 132    BUN 14 4 - 21   Creatinine 0.8 0.5 - 1.1   Potassium 4.6 3.4 - 5.3   Sodium 141 137 - 147  Hepatic function panel  Result Value Ref Range   Alkaline Phosphatase 49 25 - 125   ALT 13 7 - 35   AST 12 (A) 13 - 35   Bilirubin, Total 0.4   CBC and differential  Result Value Ref Range   Hemoglobin 12.6 12.0 - 16.0   HCT 38 36 - 46   Platelets 240 150 - 399   WBC 6.1   Hemoglobin A1c  Result Value Ref Range   Hemoglobin A1C 7.8       Assessment & Plan:   Problem List Items Addressed This Visit      Endocrine   Type II diabetes mellitus, uncontrolled ([place]) - Primary    Followed by endocrine. Seeing them later this month. Stable. Continue to monitor.       Relevant Medications   lisinopril (PRINIVIL,ZESTRIL) 20 MG tablet     Genitourinary   Benign hypertensive renal disease    Under good control. Continue current regimen. Continue to monitor. Call with any concerns.       Relevant Orders   Microalbumin, Urine Waived     Other   Hypercholesteremia    Under good control. Continue current regimen. Continue to monitor. Call with any concerns.        Relevant Medications   lisinopril (PRINIVIL,ZESTRIL) 20 MG tablet   Other Relevant Orders   Lipid Panel w/o Chol/HDL Ratio   Depression    Under good control. Continue current regimen. Continue to monitor. Call with any concerns.        Other Visit Diagnoses    Other fatigue       Did not get TSH checked at physical. Lab drawn today.       Follow up plan: Return in about 6 months (around 01/31/2018) for Physical.

## 2017-08-04 LAB — LIPID PANEL W/O CHOL/HDL RATIO
Cholesterol, Total: 150 mg/dL (ref 100–199)
HDL: 58 mg/dL (ref >39)
LDL Calculated: 68 mg/dL (ref 0–99)
TRIGLYCERIDES: 118 mg/dL (ref 0–149)
VLDL CHOLESTEROL CAL: 24 mg/dL (ref 5–40)

## 2017-08-04 LAB — TSH: TSH: 0.919 u[IU]/mL (ref 0.450–4.500)

## 2017-08-30 ENCOUNTER — Encounter
Admission: RE | Admit: 2017-08-30 | Discharge: 2017-08-30 | Disposition: A | Discharge status: Home / Self Care | Payer: 59 | Source: Ambulatory Visit | Attending: Surgery | Admitting: Surgery

## 2017-08-30 DIAGNOSIS — E119 Type 2 diabetes mellitus without complications: Secondary | ICD-10-CM | POA: Diagnosis not present

## 2017-08-30 DIAGNOSIS — I10 Essential (primary) hypertension: Secondary | ICD-10-CM | POA: Insufficient documentation

## 2017-08-30 DIAGNOSIS — Z01810 Encounter for preprocedural cardiovascular examination: Secondary | ICD-10-CM | POA: Insufficient documentation

## 2017-08-30 NOTE — Patient Instructions (Addendum)
Your procedure is scheduled on: Tuesday 09/06/17 Report to [facility]. 2ND FLOOR MEDICAL MALL ENTRANCE. To find out your arrival time please call [phone] between 1PM - 3PM on Monday 09/05/17.  Remember: Instructions that are not followed completely may result in serious medical risk, up to and including death, or upon the discretion of your surgeon and anesthesiologist your surgery may need to be rescheduled.    __X__ 1. Do not eat anything after midnight the night before your    procedure.  No gum chewing or hard candies.  You may drink clear   liquids up to 2 hours before you are scheduled to arrive at the   hospital for your procedure. Do not drink clear liquids within 2   hours of scheduled arrival to the hospital as this may lead to your   procedure being delayed or rescheduled.       Clear liquids include:   Water or Apple juice without pulp   Clear carbohydrate beverage such as Clearfast or Gatorade   Black coffee or Clear Tea (no milk, no creamer, do not add anything   to the coffee or tea)    Diabetics should only drink water   __X__ 2. No Alcohol for 24 hours before or after surgery.   ____ 3. Bring all medications with you on the day of surgery if instructed.    __X__ 4. Notify your doctor if there is any change in your medical condition     (cold, fever, infections).             __X___5. No smoking within 24 hours of your surgery.     Do not wear jewelry, make-up, hairpins, clips or nail polish.  Do not wear lotions, powders, or perfumes.   Do not shave 48 hours prior to surgery. Men may shave face and neck.  Do not bring valuables to the hospital.    [facility] is not responsible for any belongings or valuables.               Contacts, dentures or bridgework may not be worn into surgery.  Leave your suitcase in the car. After surgery it may be brought to your room.  For patients admitted to the hospital, discharge time is determined by your                treatment  team.   Patients discharged the day of surgery will not be allowed to drive home.   Please read over the following fact sheets that you were given:   MRSA Information   __x__ Take these medicines the morning of surgery with A SIP OF WATER:    1. fexofenadine  2. gabapentin  3. Omeprazole and pantoprazole  4. paroxetine  5.  6.  ____ Fleet Enema (as directed)   __x__ Use CHG Soap/SAGE wipes as directed  ____ Use inhalers on the day of surgery  __x__ Stop metformin 2 days prior to surgery Last dose 09/03/17   __x__ Take 1/2 of usual insulin dose the night before surgery and none on the morning of surgery.   __x__ Stop Coumadin/Plavix/aspirin on today  __X__ Stop Anti-inflammatories such as Advil, Aleve, Ibuprofen, Motrin, Naproxen, Naprosyn, Goodies,powder, or aspirin products.  OK to take Tylenol.   __X__ Stop supplements, Vitamin E, Fish Oil until after surgery.    ____ Bring C-Pap to the hospital.

## 2017-09-06 ENCOUNTER — Ambulatory Visit: Payer: 59

## 2017-09-06 ENCOUNTER — Encounter
Admission: RE | Disposition: A | Discharge status: Home / Self Care | Payer: Self-pay | Source: Ambulatory Visit | Attending: Surgery

## 2017-09-06 ENCOUNTER — Ambulatory Visit: Payer: 59 | Admitting: Anesthesiology

## 2017-09-06 ENCOUNTER — Ambulatory Visit
Admission: RE | Admit: 2017-09-06 | Discharge: 2017-09-06 | Disposition: A | Discharge status: Home / Self Care | Payer: 59 | Source: Ambulatory Visit | Attending: Surgery | Admitting: Surgery

## 2017-09-06 ENCOUNTER — Encounter: Payer: Self-pay | Admitting: *Deleted

## 2017-09-06 DIAGNOSIS — Z800 Family history of malignant neoplasm of digestive organs: Secondary | ICD-10-CM | POA: Insufficient documentation

## 2017-09-06 DIAGNOSIS — K660 Peritoneal adhesions (postprocedural) (postinfection): Secondary | ICD-10-CM | POA: Diagnosis not present

## 2017-09-06 DIAGNOSIS — Z794 Long term (current) use of insulin: Secondary | ICD-10-CM | POA: Diagnosis not present

## 2017-09-06 DIAGNOSIS — I10 Essential (primary) hypertension: Secondary | ICD-10-CM | POA: Diagnosis not present

## 2017-09-06 DIAGNOSIS — F329 Major depressive disorder, single episode, unspecified: Secondary | ICD-10-CM | POA: Diagnosis not present

## 2017-09-06 DIAGNOSIS — Z87891 Personal history of nicotine dependence: Secondary | ICD-10-CM | POA: Insufficient documentation

## 2017-09-06 DIAGNOSIS — K8010 Calculus of gallbladder with chronic cholecystitis without obstruction: Secondary | ICD-10-CM | POA: Insufficient documentation

## 2017-09-06 DIAGNOSIS — M170 Bilateral primary osteoarthritis of knee: Secondary | ICD-10-CM | POA: Insufficient documentation

## 2017-09-06 DIAGNOSIS — K219 Gastro-esophageal reflux disease without esophagitis: Secondary | ICD-10-CM | POA: Diagnosis not present

## 2017-09-06 DIAGNOSIS — E119 Type 2 diabetes mellitus without complications: Secondary | ICD-10-CM | POA: Insufficient documentation

## 2017-09-06 DIAGNOSIS — Z888 Allergy status to other drugs, medicaments and biological substances status: Secondary | ICD-10-CM | POA: Insufficient documentation

## 2017-09-06 DIAGNOSIS — Z5309 Procedure and treatment not carried out because of other contraindication: Secondary | ICD-10-CM | POA: Insufficient documentation

## 2017-09-06 DIAGNOSIS — K8020 Calculus of gallbladder without cholecystitis without obstruction: Secondary | ICD-10-CM

## 2017-09-06 DIAGNOSIS — Z7982 Long term (current) use of aspirin: Secondary | ICD-10-CM | POA: Diagnosis not present

## 2017-09-06 DIAGNOSIS — Z853 Personal history of malignant neoplasm of breast: Secondary | ICD-10-CM | POA: Insufficient documentation

## 2017-09-06 DIAGNOSIS — E785 Hyperlipidemia, unspecified: Secondary | ICD-10-CM | POA: Diagnosis not present

## 2017-09-06 DIAGNOSIS — Z79899 Other long term (current) drug therapy: Secondary | ICD-10-CM | POA: Diagnosis not present

## 2017-09-06 DIAGNOSIS — Z86010 Personal history of colonic polyps: Secondary | ICD-10-CM | POA: Insufficient documentation

## 2017-09-06 DIAGNOSIS — Z9989 Dependence on other enabling machines and devices: Secondary | ICD-10-CM | POA: Insufficient documentation

## 2017-09-06 DIAGNOSIS — G4730 Sleep apnea, unspecified: Secondary | ICD-10-CM | POA: Insufficient documentation

## 2017-09-06 HISTORY — PX: CHOLECYSTECTOMY: SHX55

## 2017-09-06 LAB — GLUCOSE, CAPILLARY
GLUCOSE-CAPILLARY: 200 mg/dL — AB (ref 65–99)
GLUCOSE-CAPILLARY: 196 mg/dL — AB (ref 65–99)

## 2017-09-06 SURGERY — LAPAROSCOPIC CHOLECYSTECTOMY WITH INTRAOPERATIVE CHOLANGIOGRAM
Anesthesia: General | Wound class: Clean

## 2017-09-06 MED ORDER — ONDANSETRON HCL 4 MG/2ML IJ SOLN: 4.0000 mg | Freq: Once | INTRAMUSCULAR | Status: DC | PRN

## 2017-09-06 MED ORDER — PHENYLEPHRINE HCL 10 MG/ML IJ SOLN
INTRAMUSCULAR | Status: DC | PRN
  Administered 2017-09-06 (×3): 100 ug via INTRAVENOUS

## 2017-09-06 MED ORDER — LIDOCAINE HCL (CARDIAC) 20 MG/ML IV SOLN
INTRAVENOUS | Status: DC | PRN
Start: 2017-09-06 — End: 2017-09-06
  Administered 2017-09-06: 40 mg via INTRAVENOUS

## 2017-09-06 MED ORDER — SUGAMMADEX SODIUM 500 MG/5ML IV SOLN
INTRAVENOUS | Status: DC | PRN
  Administered 2017-09-06: 50 mg via INTRAVENOUS
  Administered 2017-09-06: 200 mg via INTRAVENOUS

## 2017-09-06 MED ORDER — DEXAMETHASONE SODIUM PHOSPHATE 10 MG/ML IJ SOLN
INTRAMUSCULAR | Status: AC
  Filled 2017-09-06: qty 1

## 2017-09-06 MED ORDER — ONDANSETRON HCL 4 MG/2ML IJ SOLN
INTRAMUSCULAR | Status: AC
  Filled 2017-09-06: qty 2

## 2017-09-06 MED ORDER — HEPARIN SODIUM (PORCINE) 5000 UNIT/ML IJ SOLN
INTRAMUSCULAR | Status: AC
  Filled 2017-09-06: qty 1

## 2017-09-06 MED ORDER — SODIUM CHLORIDE 0.9 % IV SOLN: INTRAVENOUS | Status: DC | PRN

## 2017-09-06 MED ORDER — ROCURONIUM BROMIDE 100 MG/10ML IV SOLN
INTRAVENOUS | Status: DC | PRN
  Administered 2017-09-06 (×2): 25 mg via INTRAVENOUS
  Administered 2017-09-06: 10 mg via INTRAVENOUS

## 2017-09-06 MED ORDER — IOTHALAMATE MEGLUMINE 60 % INJ SOLN: INTRAMUSCULAR | Status: DC | PRN

## 2017-09-06 MED ORDER — FENTANYL CITRATE (PF) 100 MCG/2ML IJ SOLN
INTRAMUSCULAR | Status: DC | PRN
  Administered 2017-09-06: 100 ug via INTRAVENOUS
  Administered 2017-09-06: 50 ug via INTRAVENOUS

## 2017-09-06 MED ORDER — PROPOFOL 10 MG/ML IV BOLUS
INTRAVENOUS | Status: DC | PRN
  Administered 2017-09-06: 130 mg via INTRAVENOUS

## 2017-09-06 MED ORDER — HYDROCODONE-ACETAMINOPHEN 5-325 MG PO TABS: 1.0000 | ORAL_TABLET | ORAL | Status: DC | PRN

## 2017-09-06 MED ORDER — BUPIVACAINE-EPINEPHRINE (PF) 0.5% -1:200000 IJ SOLN
INTRAMUSCULAR | Status: AC
Start: 2017-09-06 — End: 2017-09-06
  Filled 2017-09-06: qty 30

## 2017-09-06 MED ORDER — ONDANSETRON HCL 4 MG/2ML IJ SOLN
INTRAMUSCULAR | Status: DC | PRN
  Administered 2017-09-06: 4 mg via INTRAVENOUS

## 2017-09-06 MED ORDER — MIDAZOLAM HCL 2 MG/2ML IJ SOLN
INTRAMUSCULAR | Status: DC | PRN
  Administered 2017-09-06: 2 mg via INTRAVENOUS

## 2017-09-06 MED ORDER — BUPIVACAINE-EPINEPHRINE (PF) 0.5% -1:200000 IJ SOLN
INTRAMUSCULAR | Status: DC | PRN
  Administered 2017-09-06: 6 mL via PERINEURAL

## 2017-09-06 MED ORDER — ROCURONIUM BROMIDE 50 MG/5ML IV SOLN
INTRAVENOUS | Status: AC
  Filled 2017-09-06: qty 1

## 2017-09-06 MED ORDER — DEXAMETHASONE SODIUM PHOSPHATE 10 MG/ML IJ SOLN
INTRAMUSCULAR | Status: DC | PRN
  Administered 2017-09-06: 5 mg via INTRAVENOUS

## 2017-09-06 MED ORDER — SUCCINYLCHOLINE CHLORIDE 20 MG/ML IJ SOLN
INTRAMUSCULAR | Status: AC
  Filled 2017-09-06: qty 1

## 2017-09-06 MED ORDER — FENTANYL CITRATE (PF) 100 MCG/2ML IJ SOLN
INTRAMUSCULAR | Status: AC
  Filled 2017-09-06: qty 2

## 2017-09-06 MED ORDER — HYDROCODONE-ACETAMINOPHEN 5-325 MG PO TABS: 1.0000 | ORAL_TABLET | ORAL | 0 refills | Status: DC | PRN

## 2017-09-06 MED ORDER — MIDAZOLAM HCL 2 MG/2ML IJ SOLN
INTRAMUSCULAR | Status: AC
  Filled 2017-09-06: qty 2

## 2017-09-06 MED ORDER — FENTANYL CITRATE (PF) 100 MCG/2ML IJ SOLN: 25.0000 ug | INTRAMUSCULAR | Status: DC | PRN

## 2017-09-06 MED ORDER — LIDOCAINE HCL (PF) 2 % IJ SOLN
INTRAMUSCULAR | Status: AC
  Filled 2017-09-06: qty 2

## 2017-09-06 MED ORDER — SODIUM CHLORIDE 0.9 % IV SOLN
INTRAVENOUS | Status: DC
  Administered 2017-09-06: 08:00:00 via INTRAVENOUS

## 2017-09-06 MED ORDER — SUGAMMADEX SODIUM 500 MG/5ML IV SOLN
INTRAVENOUS | Status: AC
  Filled 2017-09-06: qty 5

## 2017-09-06 SURGICAL SUPPLY — 36 items
APPLIER CLIP ROT 10 11.4 M/L (STAPLE) ×2
CANISTER SUCT 1200ML W/VALVE (MISCELLANEOUS) ×2 IMPLANT
CANNULA DILATOR 10 W/SLV (CANNULA) ×2 IMPLANT
CATH REDDICK CHOLANGI 4FR 50CM (CATHETERS) ×2 IMPLANT
CHLORAPREP W/TINT 26ML (MISCELLANEOUS) ×2 IMPLANT
CLIP APPLIE ROT 10 11.4 M/L (STAPLE) ×1 IMPLANT
DRAPE SHEET LG 3/4 BI-LAMINATE (DRAPES) ×2 IMPLANT
ELECT REM PT RETURN 9FT ADLT (ELECTROSURGICAL) ×2
ELECTRODE REM PT RTRN 9FT ADLT (ELECTROSURGICAL) ×1 IMPLANT
GAUZE SPONGE 4X4 12PLY STRL (GAUZE/BANDAGES/DRESSINGS) ×2 IMPLANT
GLOVE BIO SURGEON STRL SZ7.5 (GLOVE) ×10 IMPLANT
GOWN STRL REUS W/ TWL LRG LVL3 (GOWN DISPOSABLE) ×3 IMPLANT
GOWN STRL REUS W/TWL LRG LVL3 (GOWN DISPOSABLE) ×3
IRRIGATION STRYKERFLOW (MISCELLANEOUS) ×1 IMPLANT
IRRIGATOR STRYKERFLOW (MISCELLANEOUS) ×2
IV NS 1000ML (IV SOLUTION) ×1
IV NS 1000ML BAXH (IV SOLUTION) ×1 IMPLANT
KIT RM TURNOVER STRD PROC AR (KITS) ×2 IMPLANT
LABEL OR SOLS (LABEL) IMPLANT
NDL INSUFF ACCESS 14 VERSASTEP (NEEDLE) ×2 IMPLANT
NEEDLE FILTER BLUNT 18X 1/2SAF (NEEDLE) ×1
NEEDLE FILTER BLUNT 18X1 1/2 (NEEDLE) ×1 IMPLANT
NEEDLE HYPO 25X1 1.5 SAFETY (NEEDLE) ×2 IMPLANT
NS IRRIG 500ML POUR BTL (IV SOLUTION) ×2 IMPLANT
PACK LAP CHOLECYSTECTOMY (MISCELLANEOUS) ×2 IMPLANT
SCISSORS METZENBAUM CVD 33 (INSTRUMENTS) ×2 IMPLANT
SEAL FOR SCOPE WARMER C3101 (MISCELLANEOUS) IMPLANT
SLEEVE ENDOPATH XCEL 5M (ENDOMECHANICALS) ×2 IMPLANT
STRIP CLOSURE SKIN 1/2X4 (GAUZE/BANDAGES/DRESSINGS) ×2 IMPLANT
SUT CHROMIC 5 0 RB 1 27 (SUTURE) ×4 IMPLANT
SUT VIC AB 0 CT2 27 (SUTURE) IMPLANT
SYR 3ML LL SCALE MARK (SYRINGE) ×2 IMPLANT
TROCAR XCEL NON-BLD 11X100MML (ENDOMECHANICALS) ×2 IMPLANT
TROCAR XCEL NON-BLD 5MMX100MML (ENDOMECHANICALS) ×2 IMPLANT
TUBING INSUFFLATOR HI FLOW (MISCELLANEOUS) ×2 IMPLANT
WATER STERILE IRR 1000ML POUR (IV SOLUTION) IMPLANT

## 2017-09-06 NOTE — Op Note (Addendum)
OPERATIVE REPORT  PREOPERATIVE DIAGNOSIS:  Chronic cholecystitis cholelithiasis  POSTOPERATIVE DIAGNOSIS: Chronic cholecystitis cholelithiasis  PROCEDURE: Laparoscopy  ANESTHESIA: General  SURGEON: Rochel Brome M.D.  INDICATIONS: She had a history of epigastric pain in January another episode in February and another episode in June. She had ultrasound findings of gallstones and thickened gallbladder wall. During surgery there was a finding of dense adhesions surrounding the gallbladder and decision was made to close without removing the gallbladder.   With the patient on the operating table in the supine position under general endotracheal anesthesia the abdomen was prepared with ChloraPrep solution and draped in a sterile manner. A short incision was made in the inferior aspect of the umbilicus and carried down to the deep fascia which was grasped with a laryngeal hook. A Veress needle was inserted aspirated and irrigated with a saline solution. The peritoneal cavity was insufflated with carbon dioxide. The Veress needle was removed. The 10 mm cannula was inserted. The 10 mm 0 laparoscope was inserted to view the peritoneal cavity.  Another incision was made in the epigastrium slightly to the right of the midline to introduce an 11 mm cannula. 2 incisions were made in the lateral aspect of the right upper quadrant to introduce 2   5 mm cannulas. Initial inspection revealed a smooth surface of the liver and omentum and several loops of small bowel and sigmoid colon which appeared typical. Dissection was begun in the right upper quadrant finding the omentum adherent to the liver. Dissection was done with microdissector and also and cautery and also use of suction. There were multiple adhesions between the omentum and the liver. As these adhesions were dissected there were multiple places that bled. Multiple bleeding points were cauterized and also pressure held and site was aspirated. After a period  of time of dissection and appeared that little progress was being made towards finding the gallbladder. There was an unusually large amount of dense adhesions which were quite friable. The decision was made on her presenting history and current findings to abort the operation and removed cannulas without removing the gallbladder. The other option of proceeding to an open operation was considered. Decision was made to close and avoid potential complications of surgery. The skin incisions were closed with interrupted 5-0 chromic subcutaneous suture benzoin and Steri-Strips. Gauze dressings were applied with paper tape.  The patient appeared to be in satisfactory condition and was prepared for transfer to the recovery room  [place]ba.D.

## 2017-09-06 NOTE — H&P (Signed)
  She comes for laparoscopic cholecystectomy.  She reports no pains and no change in condition  since office visit.  Labs noted  Discussed plan for lap cholecystectomy

## 2017-09-06 NOTE — Transfer of Care (Signed)
Immediate Anesthesia Transfer of Care Note  Patient: Sierra Copeland  Procedure(s) Performed: LAPAROSCOPY (N/A )  Patient Location: PACU  Anesthesia Type:General  Level of Consciousness: awake, alert  and oriented  Airway & Oxygen Therapy: Patient Spontanous Breathing and Patient connected to face mask oxygen  Post-op Assessment: Report given to RN and Post -op Vital signs reviewed and stable  Post vital signs: Reviewed and stable  Last Vitals:  Vitals:   09/06/17 0738 09/06/17 1040  BP: (!) 179/95 132/80  Pulse: 89   Resp: 18 16  Temp: 36.7 C 36.4 C  SpO2: 97% 100%    Last Pain:  Vitals:   09/06/17 0738  TempSrc: Oral         Complications: No apparent anesthesia complications

## 2017-09-06 NOTE — Anesthesia Preprocedure Evaluation (Signed)
Anesthesia Evaluation  Patient identified by MRN, date of birth, ID band Patient awake    Reviewed: Allergy & Precautions, H&P , NPO status , Patient's Chart, lab work & pertinent test results, reviewed documented beta blocker date and time   History of Anesthesia Complications (+) PONV and history of anesthetic complications  Airway Mallampati: III  TM Distance: <3 FB Neck ROM: full    Dental  (+) Poor Dentition   Pulmonary neg pulmonary ROS, sleep apnea and Continuous Positive Airway Pressure Ventilation , former smoker,    Pulmonary exam normal        Cardiovascular Exercise Tolerance: Poor hypertension, On Medications negative cardio ROS Normal cardiovascular exam Rhythm:regular Rate:Normal     Neuro/Psych  Headaches, PSYCHIATRIC DISORDERS negative neurological ROS  negative psych ROS   GI/Hepatic negative GI ROS, Neg liver ROS, GERD  Medicated,  Endo/Other  negative endocrine ROSdiabetes  Renal/GU Renal diseasenegative Renal ROS  negative genitourinary   Musculoskeletal   Abdominal   Peds  Hematology negative hematology ROS (+)   Anesthesia Other Findings Past Medical History: No date: Abnormal mammogram No date: Arthritis No date: Back pain     Comment:  spinal stenosis and neck bone spur No date: Cancer ([place])     Comment:  Breast No date: Car sickness No date: Collagenous colitis No date: Depression No date: Diabetes mellitus without complication (HCC) No date: GERD (gastroesophageal reflux disease)     Comment:  occ-no meds No date: Hair loss     Comment:  TAKING THYROTAIN No date: Headache     Comment:  migraines No date: Hyperlipemia No date: Hypertension No date: Osteoarthritis     Comment:  seen by Rheum, not enoug evidence to support RA No date: Pinched nerve No date: PONV (postoperative nausea and vomiting)     Comment:  after tonsillectomy No date: Rosacea No date: Seborrheic  dermatitis No date: Sleep apnea No date: Wears glasses Past Surgical History: 2007: ACHILLES TENDON REPAIR     Comment:  right 2011: BREAST SURGERY     Comment:  lt br bx-non cancer No date: CARPAL TUNNEL RELEASE     Comment:  right and left No date: COLONOSCOPY 2017: epidural steroid injection x2 01/21/2016: ESOPHAGOGASTRODUODENOSCOPY; N/A     Comment:  Procedure: ESOPHAGOGASTRODUODENOSCOPY (EGD);  Surgeon:               Hulen Luster, MD;  Location: [facility];  Service:               Gastroenterology;  Laterality: N/A;  Please call at work               [phone] Diabetic - insulin 2013: FINGER EXPLORATION     Comment:  left long  No date: MASTECTOMY; Left     Comment:  Partial 10/03/2013: NERVE EXPLORATION; Left     Comment:  Procedure: NERVE EXPLORATION/ LEFT LONG RADIAL DISTAL               NERVE neurolysis;  Surgeon: Schuyler Amor, MD;                Location: [facility];  Service:               Orthopedics;  Laterality: Left; 05/13/2016: SHOULDER ARTHROSCOPY WITH DEBRIDEMENT AND BICEP TENDON  REPAIR; Right     Comment:  Procedure: Arthroscopic debridement, open decompression,              rotator cuff  repair, tenodesis,right shoulder.;  Surgeon:              Corky Mull, MD;  Location: [facility];  Service:               Orthopedics;  Laterality: Right; No date: TONSILLECTOMY 1998: UVULOPALATOPHARYNGOPLASTY     Comment:  and tonsils-tongue pexi   Reproductive/Obstetrics negative OB ROS                             Anesthesia Physical Anesthesia Plan  ASA: III  Anesthesia Plan: General   Post-op Pain Management:    Induction:   PONV Risk Score and Plan:   Airway Management Planned:   Additional Equipment:   Intra-op Plan:   Post-operative Plan:   Informed Consent: I have reviewed the patients History and Physical, chart, labs and discussed the procedure including the risks, benefits and alternatives for  the proposed anesthesia with the patient or authorized representative who has indicated his/her understanding and acceptance.   Dental Advisory Given  Plan Discussed with: CRNA  Anesthesia Plan Comments:         Anesthesia Quick Evaluation

## 2017-09-06 NOTE — Anesthesia Post-op Follow-up Note (Signed)
Anesthesia QCDR form completed.        

## 2017-09-06 NOTE — Anesthesia Procedure Notes (Signed)
Procedure Name: Intubation Date/Time: 09/06/2017 9:11 AM Performed by: Jonna Clark Pre-anesthesia Checklist: Patient identified, Patient being monitored, Timeout performed, Emergency Drugs available and Suction available Patient Re-evaluated:Patient Re-evaluated prior to induction Oxygen Delivery Method: Circle system utilized Preoxygenation: Pre-oxygenation with 100% oxygen Induction Type: IV induction Ventilation: Mask ventilation without difficulty Laryngoscope Size: 3 and McGraph Grade View: Grade I Tube type: Oral Tube size: 7.0 mm Number of attempts: 1 Airway Equipment and Method: Stylet Placement Confirmation: ETT inserted through vocal cords under direct vision,  positive ETCO2 and breath sounds checked- equal and bilateral Secured at: 21 cm Tube secured with: Tape Dental Injury: Teeth and Oropharynx as per pre-operative assessment

## 2017-09-06 NOTE — Discharge Instructions (Addendum)
AMBULATORY SURGERY  DISCHARGE INSTRUCTIONS   1) The drugs that you were given will stay in your system until tomorrow so for the next 24 hours you should not:  A) Drive an automobile B) Make any legal decisions C) Drink any alcoholic beverage   2) You may resume regular meals tomorrow.  Today it is better to start with liquids and gradually work up to solid foods.  You may eat anything you prefer, but it is better to start with liquids, then soup and crackers, and gradually work up to solid foods.   3) Please notify your doctor immediately if you have any unusual bleeding, trouble breathing, redness and pain at the surgery site, drainage, fever, or pain not relieved by medication.    4) Additional Instructions:        Please contact your physician with any problems or Same Day Surgery at [phone], Monday through Friday 6 am to 4 pm, or [place] at [facility] number at [phone].Take Tylenol or Norco if needed for pain.  Should not drive or do anything dangerous when taking Norco.  Remove dressings on Wednesday. May shower Thursday.

## 2017-09-07 NOTE — Anesthesia Postprocedure Evaluation (Signed)
Anesthesia Post Note  Patient: Sierra Copeland  Procedure(s) Performed: LAPAROSCOPY (N/A )  Patient location during evaluation: PACU Anesthesia Type: General Level of consciousness: awake and alert Pain management: pain level controlled Vital Signs Assessment: post-procedure vital signs reviewed and stable Respiratory status: spontaneous breathing, nonlabored ventilation, respiratory function stable and patient connected to nasal cannula oxygen Cardiovascular status: blood pressure returned to baseline and stable Postop Assessment: no apparent nausea or vomiting Anesthetic complications: no     Last Vitals:  Vitals:   09/06/17 1123 09/06/17 1153  BP: (!) 144/72 (!) 162/78  Pulse:  82  Resp: 18 18  Temp: 36.7 C   SpO2: 96%     Last Pain:  Vitals:   09/07/17 0830  TempSrc:   PainSc: 1                  Molli Barrows

## 2017-10-28 LAB — HM DIABETES EYE EXAM

## 2017-11-18 LAB — HEMOGLOBIN A1C: Hemoglobin A1C: 9.7

## 2017-12-26 ENCOUNTER — Other Ambulatory Visit: Payer: Self-pay | Admitting: Family Medicine

## 2018-01-04 ENCOUNTER — Other Ambulatory Visit: Payer: Self-pay | Admitting: Family Medicine

## 2018-01-31 ENCOUNTER — Encounter: Payer: Self-pay | Admitting: Family Medicine

## 2018-01-31 ENCOUNTER — Ambulatory Visit (INDEPENDENT_AMBULATORY_CARE_PROVIDER_SITE_OTHER): Payer: No Typology Code available for payment source | Admitting: Family Medicine

## 2018-01-31 VITALS — BP 137/82 | HR 81 | Temp 97.3°F | Ht 61.0 in | Wt 237.0 lb

## 2018-01-31 DIAGNOSIS — R8281 Pyuria: Secondary | ICD-10-CM

## 2018-01-31 DIAGNOSIS — F3342 Major depressive disorder, recurrent, in full remission: Secondary | ICD-10-CM | POA: Diagnosis not present

## 2018-01-31 DIAGNOSIS — C50912 Malignant neoplasm of unspecified site of left female breast: Secondary | ICD-10-CM

## 2018-01-31 DIAGNOSIS — J0100 Acute maxillary sinusitis, unspecified: Secondary | ICD-10-CM | POA: Diagnosis not present

## 2018-01-31 DIAGNOSIS — E1165 Type 2 diabetes mellitus with hyperglycemia: Secondary | ICD-10-CM | POA: Diagnosis not present

## 2018-01-31 DIAGNOSIS — Z0001 Encounter for general adult medical examination with abnormal findings: Secondary | ICD-10-CM | POA: Diagnosis not present

## 2018-01-31 DIAGNOSIS — E7800 Pure hypercholesterolemia, unspecified: Secondary | ICD-10-CM | POA: Diagnosis not present

## 2018-01-31 DIAGNOSIS — I129 Hypertensive chronic kidney disease with stage 1 through stage 4 chronic kidney disease, or unspecified chronic kidney disease: Secondary | ICD-10-CM | POA: Diagnosis not present

## 2018-01-31 DIAGNOSIS — N390 Urinary tract infection, site not specified: Secondary | ICD-10-CM | POA: Diagnosis not present

## 2018-01-31 DIAGNOSIS — Z0000 Encounter for general adult medical examination without abnormal findings: Secondary | ICD-10-CM

## 2018-01-31 MED ORDER — AMOXICILLIN-POT CLAVULANATE 875-125 MG PO TABS: 1.0000 | ORAL_TABLET | Freq: Two times a day (BID) | ORAL | 0 refills | Status: DC

## 2018-01-31 NOTE — Assessment & Plan Note (Signed)
Stable. Continue current regimen. Continue to monitor. Call with any concerns.  

## 2018-01-31 NOTE — Assessment & Plan Note (Signed)
Under good control. Continue current regimen. Continue to monitor. Call with any concerns. Refills given today. 

## 2018-01-31 NOTE — Assessment & Plan Note (Signed)
Following with endocrinology. Not under good control. Call with any concerns.

## 2018-01-31 NOTE — Assessment & Plan Note (Signed)
Encouraged diet and exercise. Work on losing 1-2lbs a week. Call with any concerns.

## 2018-01-31 NOTE — Progress Notes (Signed)
error 

## 2018-01-31 NOTE — Assessment & Plan Note (Addendum)
Under good control. Continue current regimen. Continue to monitor. Call with any concerns. Refills given today. 

## 2018-01-31 NOTE — Progress Notes (Signed)
BP 137/82 (BP Location: Left Arm, Patient Position: Sitting, Cuff Size: Large)   Pulse 81   Temp (!) 97.3 F (36.3 C)   Ht _0  (1.549 m)   Wt 237 lb (107.5 kg)   LMP 03/17/2013 (Approximate)   SpO2 98%   BMI 44.78 kg/m    Subjective:    Patient ID: Sierra Copeland, female    DOB: 03-16-1956, 62 y.o.   MRN: [id number]  HPI: Sierra Copeland is a 62 y.o. female presenting on 01/31/2018 for comprehensive medical examination. Current medical complaints include:   UPPER RESPIRATORY TRACT INFECTION Duration: couple of months, off and on Worst symptom: ear pain Fever: no Cough: no Shortness of breath: no Wheezing: no Chest pain: no Chest tightness: no Chest congestion: no Nasal congestion: yes Runny nose: no Post nasal drip: yes Sneezing: no Sore throat: no Swollen glands: yes Sinus pressure: yes Headache: yes Face pain: yes Toothache: no Ear pain: no  Ear pressure: yes left Eyes red/itching:yes Eye drainage/crusting: no  Vomiting: no Rash: no Fatigue: yes Sick contacts: no Strep contacts: no  Context: stable Recurrent sinusitis: no Relief with OTC cold/cough medications: no  Treatments attempted: cold/sinus, mucinex and pseudoephedrine   DIABETES- following with endocrinology, seeing them again in May Hypoglycemic episodes:no Polydipsia/polyuria: no Visual disturbance: no Chest pain: no Paresthesias: no Glucose Monitoring: yes Taking Insulin?: yes Blood Pressure Monitoring: not checking Retinal Examination: Up to Date Foot Exam: Up to Date Diabetic Education: Completed Pneumovax: Up to Date Influenza: Up to Date Aspirin: yes  HYPERTENSION / HYPERLIPIDEMIA Satisfied with current treatment? yes Duration of hypertension: chronic BP monitoring frequency: not checking BP medication side effects: none Past BP meds: lisinopril Duration of hyperlipidemia: chronic Cholesterol medication side effects: no Cholesterol supplements: none Past cholesterol  medications: simvastatin (zocor) Medication compliance: excellent compliance Aspirin: yes Recent stressors: no Recurrent headaches: no Visual changes: no Palpitations: no Dyspnea: no Chest pain: no Lower extremity edema: no Dizzy/lightheaded: no  DEPRESSION Mood status: controlled Satisfied with current treatment?: yes Symptom severity: mild  Duration of current treatment : chronic Side effects: no Medication compliance: excellent compliance Psychotherapy/counseling: yes  Previous psychiatric medications: paxil Depressed mood: yes Anxious mood: no Anhedonia: no Significant weight loss or gain: no Insomnia: no  Fatigue: yes Feelings of worthlessness or guilt: no Impaired concentration/indecisiveness: no Suicidal ideations: no Hopelessness: no Crying spells: no Depression screen [facility] 2/9 01/31/2018 08/03/2017 07/12/2017 01/14/2017 07/14/2016  Decreased Interest 0 0 0 0 0  Down, Depressed, Hopeless 0 0 0 0 0  PHQ - 2 Score 0 0 0 0 0  Altered sleeping _1 - 0  Tired, decreased energy _2 - 0  Change in appetite 0 0 0 - 0  Feeling bad or failure about yourself  0 0 0 - 0  Trouble concentrating 0 0 0 - 0  Moving slowly or fidgety/restless 0 0 0 - 0  Suicidal thoughts 0 0 0 - 0  PHQ-9 Score _3 - 0  Difficult doing work/chores - - Not difficult at all - -     Menopausal Symptoms: no  Depression Screen done today and results listed below:  Depression screen [facility] 2/9 01/31/2018 08/03/2017 07/12/2017 01/14/2017 07/14/2016  Decreased Interest 0 0 0 0 0  Down, Depressed, Hopeless 0 0 0 0 0  PHQ - 2 Score 0 0 0 0 0  Altered sleeping _4 - 0  Tired, decreased energy _5 - 0  Change  in appetite 0 0 0 - 0  Feeling bad or failure about yourself  0 0 0 - 0  Trouble concentrating 0 0 0 - 0  Moving slowly or fidgety/restless 0 0 0 - 0  Suicidal thoughts 0 0 0 - 0  PHQ-9 Score _0 - 0  Difficult doing work/chores - - Not difficult at all - -    Past Medical History:  Past  Medical History:  Diagnosis Date  . Abnormal mammogram   . Arthritis   . Back pain    spinal stenosis and neck bone spur  . Cancer (HCC)    Breast  . Car sickness   . Collagenous colitis   . Depression   . Diabetes mellitus without complication ([place])   . GERD (gastroesophageal reflux disease)    occ-no meds  . Hair loss    TAKING THYROTAIN  . Headache    migraines  . Hyperlipemia   . Hypertension   . Osteoarthritis    seen by Rheum, not enoug evidence to support RA  . Pinched nerve   . PONV (postoperative nausea and vomiting)    after tonsillectomy  . Rosacea   . Seborrheic dermatitis   . Sleep apnea   . Wears glasses     Surgical History:  Past Surgical History:  Procedure Laterality Date  . ACHILLES TENDON REPAIR  2007   right  . BREAST SURGERY  2011   lt br bx-non cancer  . CARPAL TUNNEL RELEASE     right and left  . CHOLECYSTECTOMY N/A 09/06/2017   Procedure: LAPAROSCOPY;  Surgeon: Leonie Green, MD;  Location: [facility];  Service: General;  Laterality: N/A;  . COLONOSCOPY    . epidural steroid injection x2  2017  . ESOPHAGOGASTRODUODENOSCOPY N/A 01/21/2016   Procedure: ESOPHAGOGASTRODUODENOSCOPY (EGD);  Surgeon: Hulen Luster, MD;  Location: [place];  Service: Gastroenterology;  Laterality: N/A;  Please call at work [phone] Diabetic - insulin  . FINGER EXPLORATION  2013   left long   . MASTECTOMY Left    Partial  . NERVE EXPLORATION Left 10/03/2013   Procedure: NERVE EXPLORATION/ LEFT LONG RADIAL DISTAL NERVE neurolysis;  Surgeon: Schuyler Amor, MD;  Location: [place];  Service: Orthopedics;  Laterality: Left;  . SHOULDER ARTHROSCOPY WITH DEBRIDEMENT AND BICEP TENDON REPAIR Right 05/13/2016   Procedure: Arthroscopic debridement, open decompression, rotator cuff repair, tenodesis,right shoulder.;  Surgeon: Corky Mull, MD;  Location: [facility];  Service: Orthopedics;  Laterality: Right;  . TONSILLECTOMY    .  UVULOPALATOPHARYNGOPLASTY  1998   and tonsils-tongue pexi    Medications:  Current Outpatient Medications on File Prior to Visit  Medication Sig  . Cholecalciferol (VITAMIN D3) 2000 units capsule Take 2,000 Units by mouth at bedtime.   . fexofenadine (ALLEGRA) 180 MG tablet Take 180 mg by mouth daily with breakfast.   . fluticasone (FLONASE) 50 MCG/ACT nasal spray Place 1-2 sprays into both nostrils every evening.   . folic acid (FOLVITE) 1 MG tablet   . gabapentin (NEURONTIN) 100 MG capsule Take 100 mg by mouth 2 (two) times daily.   Marland Kitchen glucose blood test strip 1 each by Other route as needed for other. One Touch Ultra. Test FSBS 3x a day. Dx:E11.65, LON 99 months: disp 300 with 1 refill.  Marland Kitchen ibuprofen (ADVIL,MOTRIN) 200 MG tablet Take 800 mg by mouth every 8 (eight) hours as needed (for pain.).   . Insulin Glargine (TOUJEO SOLOSTAR) 300  UNIT/ML SOPN Inject 100 Units into the skin at bedtime.   . insulin lispro (HUMALOG) 100 UNIT/ML injection Inject 15-25 Units into the skin 3 (three) times daily with meals. 15 UNITS BEFORE BREAKFAST AND 25 UNITS BEFORE SUPPER  . Insulin Syringe-Needle U-100 30G X 5/16" 0.3 ML MISC   . l-methylfolate-B6-B12 (METANX) 3-35-2 MG TABS tablet Take 1 tablet by mouth daily. (Patient taking differently: Take 1 tablet by mouth daily with breakfast. )  . lisinopril (PRINIVIL,ZESTRIL) 20 MG tablet Take 1 tablet (20 mg total) by mouth daily. (Patient taking differently: Take 20 mg by mouth at bedtime. )  . methotrexate 50 MG/2ML injection   . pantoprazole (PROTONIX) 40 MG tablet Take 40 mg by mouth daily before breakfast.   . PARoxetine (PAXIL) 20 MG tablet TAKE 1 TABLET BY MOUTH  DAILY  . simvastatin (ZOCOR) 40 MG tablet TAKE 1 TABLET BY MOUTH AT  BEDTIME  . sucralfate (CARAFATE) 1 g tablet Take 1 tablet (1 g total) by mouth 4 (four) times daily -  with meals and at bedtime. (Patient not taking: Reported on 08/29/2017)  . tiZANidine (ZANAFLEX) 2 MG tablet Take 2 mg by  mouth at bedtime.   . traMADol (ULTRAM) 50 MG tablet Take 100 mg by mouth at bedtime.    No current facility-administered medications on file prior to visit.     Allergies:  Allergies  Allergen Reactions  . Prednisone Palpitations    Social History:  Social History   Socioeconomic History  . Marital status: Married    Spouse name: Not on file  . Number of children: Not on file  . Years of education: Not on file  . Highest education level: Not on file  Social Needs  . Financial resource strain: Not on file  . Food insecurity - worry: Not on file  . Food insecurity - inability: Not on file  . Transportation needs - medical: Not on file  . Transportation needs - non-medical: Not on file  Occupational History  . Not on file  Tobacco Use  . Smoking status: Former Smoker    Packs/day: 1.00    Years: 10.00    Pack years: 10.00    Types: Cigarettes    Last attempt to quit: 09/28/1986    Years since quitting: 31.3  . Smokeless tobacco: Never Used  Substance and Sexual Activity  . Alcohol use: No  . Drug use: No  . Sexual activity: Yes  Other Topics Concern  . Not on file  Social History Narrative  . Not on file   Social History   Tobacco Use  Smoking Status Former Smoker  . Packs/day: 1.00  . Years: 10.00  . Pack years: 10.00  . Types: Cigarettes  . Last attempt to quit: 09/28/1986  . Years since quitting: 31.3  Smokeless Tobacco Never Used   Social History   Substance and Sexual Activity  Alcohol Use No    Family History:  Family History  Problem Relation Age of Onset  . Cancer Mother        liver  . Hypertension Mother   . Hyperlipidemia Mother   . Hyperlipidemia Father   . Hypertension Father   . Stroke Maternal Grandfather   . Diabetes Paternal Grandmother   . Dementia Maternal Grandmother   . Heart disease Paternal Grandfather   . Diabetes Maternal Aunt   . Diabetes Maternal Uncle   . Heart disease Maternal Uncle   . Cancer Paternal Aunt  breast  . Diabetes Paternal Aunt   . Diabetes Paternal Uncle   . Cancer Paternal Uncle        prostate  . Cancer Paternal Aunt        breast    Past medical history, surgical history, medications, allergies, family history and social history reviewed with patient today and changes made to appropriate areas of the chart.   Review of Systems  Constitutional: Positive for diaphoresis. Negative for chills, fever, malaise/fatigue and weight loss.  HENT: Positive for congestion, sinus pain and sore throat. Negative for ear discharge, ear pain, hearing loss, nosebleeds and tinnitus.   Eyes: Negative.   Respiratory: Negative.  Negative for stridor.   Cardiovascular: Positive for palpitations and leg swelling. Negative for chest pain, orthopnea, claudication and PND.  Gastrointestinal: Positive for diarrhea and heartburn. Negative for abdominal pain, blood in stool, constipation, melena, nausea and vomiting.  Genitourinary: Negative.   Musculoskeletal: Positive for back pain, joint pain and myalgias. Negative for falls and neck pain.  Skin: Negative.   Neurological: Negative.  Negative for weakness.  Endo/Heme/Allergies: Negative.   Psychiatric/Behavioral: Negative.     All other ROS negative except what is listed above and in the HPI.      Objective:    BP 137/82 (BP Location: Left Arm, Patient Position: Sitting, Cuff Size: Large)   Pulse 81   Temp (!) 97.3 F (36.3 C)   Ht _0  (1.549 m)   Wt 237 lb (107.5 kg)   LMP 03/17/2013 (Approximate)   SpO2 98%   BMI 44.78 kg/m   Wt Readings from Last 3 Encounters:  01/31/18 237 lb (107.5 kg)  08/30/17 227 lb 9.6 oz (103.2 kg)  08/03/17 221 lb 8 oz (100.5 kg)    Physical Exam  Constitutional: She is oriented to person, place, and time. She appears well-developed and well-nourished. No distress.  HENT:  Head: Normocephalic and atraumatic.  Right Ear: Hearing, tympanic membrane, external ear and ear canal normal.  Left Ear:  Hearing, tympanic membrane, external ear and ear canal normal.  Nose: Mucosal edema and rhinorrhea present. Right sinus exhibits maxillary sinus tenderness.  Mouth/Throat: Uvula is midline, oropharynx is clear and moist and mucous membranes are normal. No oropharyngeal exudate.  Eyes: Conjunctivae, EOM and lids are normal. Pupils are equal, round, and reactive to light. Right eye exhibits no discharge. Left eye exhibits no discharge. No scleral icterus.  Neck: Normal range of motion. Neck supple. No JVD present. No tracheal deviation present. No thyromegaly present.  Cardiovascular: Normal rate, regular rhythm, normal heart sounds and intact distal pulses. Exam reveals no gallop and no friction rub.  No murmur heard. Pulmonary/Chest: Effort normal and breath sounds normal. No stridor. No respiratory distress. She has no wheezes. She has no rales. She exhibits no tenderness.  Abdominal: Soft. Bowel sounds are normal. She exhibits no distension and no mass. There is no tenderness. There is no rebound and no guarding.  Genitourinary:  Genitourinary Comments: Pelvic exam and breast exam deferred with shared decision making  Musculoskeletal: Normal range of motion. She exhibits no edema, tenderness or deformity.  Lymphadenopathy:    She has no cervical adenopathy.  Neurological: She is alert and oriented to person, place, and time. She has normal reflexes. She displays normal reflexes. No cranial nerve deficit. She exhibits normal muscle tone. Coordination normal.  Skin: Skin is warm, dry and intact. No rash noted. She is not diaphoretic. No erythema. No pallor.  Psychiatric: She has a normal  mood and affect. Her speech is normal and behavior is normal. Judgment and thought content normal. Cognition and memory are normal.  Nursing note and vitals reviewed.   Results for orders placed or performed in visit on 01/31/18  Microscopic Examination  Result Value Ref Range   WBC, UA >30 (A) 0 - 5 /hpf    RBC, UA 0-2 0 - 2 /hpf   Epithelial Cells (non renal) >10 (A) 0 - 10 /hpf   Renal Epithel, UA 0-10 (A) None seen /hpf   Bacteria, UA Moderate (A) None seen/Few  Urine Culture, Reflex  Result Value Ref Range   Urine Culture, Routine WILL FOLLOW   Microalbumin, Urine Waived  Result Value Ref Range   Microalb, Ur Waived 80 (H) 0 - 19 mg/L   Creatinine, Urine Waived 300 10 - 300 mg/dL   Microalb/Creat Ratio 30-300 (H) <30 mg/g  UA/M w/rflx Culture, Routine  Result Value Ref Range   Specific Gravity, UA 1.025 1.005 - 1.030   pH, UA 5.5 5.0 - 7.5   Color, UA Yellow Yellow   Appearance Ur Cloudy (A) Clear   Leukocytes, UA 1+ (A) Negative   Protein, UA 1+ (A) Negative/Trace   Glucose, UA Negative Negative   Ketones, UA 1+ (A) Negative   RBC, UA Trace (A) Negative   Bilirubin, UA Negative Negative   Urobilinogen, Ur 0.2 0.2 - 1.0 mg/dL   Nitrite, UA Negative Negative   Microscopic Examination See below:    Urinalysis Reflex Comment   Hemoglobin A1c  Result Value Ref Range   Hemoglobin A1C 9.7       Assessment & Plan:   Problem List Items Addressed This Visit      Endocrine   Type II diabetes mellitus, uncontrolled ([place])    Following with endocrinology. Not under good control. Call with any concerns.       Relevant Orders   Comprehensive metabolic panel   Microalbumin, Urine Waived (Completed)   UA/M w/rflx Culture, Routine (Completed)     Genitourinary   Benign hypertensive renal disease    Under good control. Continue current regimen. Continue to monitor. Call with any concerns. Refills given today.      Relevant Orders   CBC with Differential/Platelet   Comprehensive metabolic panel   Microalbumin, Urine Waived (Completed)   UA/M w/rflx Culture, Routine (Completed)     Other   Hypercholesteremia    Under good control. Continue current regimen. Continue to monitor. Call with any concerns. Refills given today.      Relevant Orders   Comprehensive metabolic panel    Lipid Panel w/o Chol/HDL Ratio   Morbid obesity ([place])    Encouraged diet and exercise. Work on losing 1-2lbs a week. Call with any concerns.       Depression    Stable. Continue current regimen. Continue to monitor. Call with any concerns.       Relevant Orders   CBC with Differential/Platelet   Comprehensive metabolic panel   TSH   Invasive ductal carcinoma of breast, female, left ([place])    left breast stage I be invasive ductal carcinoma. Tumor is ER/PR HER-2 negative. She underwent resection Apr 26, 2017 and completed radiation middle of August 2018. Continue to follow with oncology. Call with any concerns.         Relevant Medications   amoxicillin-clavulanate (AUGMENTIN) 875-125 MG tablet    Other Visit Diagnoses    Routine general medical examination at a health care facility    -  Primary   Vaccines up to date. Screening labs checked today. Pap up to date. Mammo through oncology. Call with any concerns. Colon UTD.   Acute non-recurrent maxillary sinusitis       Will treat with augmentin. Call with any concerns. Continue to monitor.    Relevant Medications   amoxicillin-clavulanate (AUGMENTIN) 875-125 MG tablet       Follow up plan: Return in about 6 months (around 08/03/2018) for Follow up.   LABORATORY TESTING:  - Pap smear: up to date  IMMUNIZATIONS:   - Tdap: Tetanus vaccination status reviewed: last tetanus booster within 10 years. - Influenza: Up to date - Pneumovax: Up to date - Prevnar: Not applicable  SCREENING: -Mammogram: Up to date  - Colonoscopy: Up to date   PATIENT COUNSELING:   Advised to take 1 mg of folate supplement per day if capable of pregnancy.   Sexuality: Discussed sexually transmitted diseases, partner selection, use of condoms, avoidance of unintended pregnancy  and contraceptive alternatives.   Advised to avoid cigarette smoking.  I discussed with the patient that most people either abstain from alcohol or drink within safe  limits (<=14/week and <=4 drinks/occasion for males, <=7/weeks and <= 3 drinks/occasion for females) and that the risk for alcohol disorders and other health effects rises proportionally with the number of drinks per week and how often a drinker exceeds daily limits.  Discussed cessation/primary prevention of drug use and availability of treatment for abuse.   Diet: Encouraged to adjust caloric intake to maintain  or achieve ideal body weight, to reduce intake of dietary saturated fat and total fat, to limit sodium intake by avoiding high sodium foods and not adding table salt, and to maintain adequate dietary potassium and calcium preferably from fresh fruits, vegetables, and low-fat dairy products.    stressed the importance of regular exercise  Injury prevention: Discussed safety belts, safety helmets, smoke detector, smoking near bedding or upholstery.   Dental health: Discussed importance of regular tooth brushing, flossing, and dental visits.    NEXT PREVENTATIVE PHYSICAL DUE IN 1 YEAR. Return in about 6 months (around 08/03/2018) for Follow up.

## 2018-01-31 NOTE — Assessment & Plan Note (Signed)
left breast stage I be invasive ductal carcinoma. Tumor is ER/PR HER-2 negative. She underwent resection Apr 26, 2017 and completed radiation middle of August 2018. Continue to follow with oncology. Call with any concerns.

## 2018-01-31 NOTE — Patient Instructions (Addendum)
Health Maintenance, Female Adopting a healthy lifestyle and getting preventive care can go a long way to promote health and wellness. Talk with your health care provider about what schedule of regular examinations is right for you. This is a good chance for you to check in with your provider about disease prevention and staying healthy. In between checkups, there are plenty of things you can do on your own. Experts have done a lot of research about which lifestyle changes and preventive measures are most likely to keep you healthy. Ask your health care provider for more information. Weight and diet Eat a healthy diet  Be sure to include plenty of vegetables, fruits, low-fat dairy products, and lean protein.  Do not eat a lot of foods high in solid fats, added sugars, or salt.  Get regular exercise. This is one of the most important things you can do for your health. ? Most adults should exercise for at least 150 minutes each week. The exercise should increase your heart rate and make you sweat (moderate-intensity exercise). ? Most adults should also do strengthening exercises at least twice a week. This is in addition to the moderate-intensity exercise.  Maintain a healthy weight  Body mass index (BMI) is a measurement that can be used to identify possible weight problems. It estimates body fat based on height and weight. Your health care provider can help determine your BMI and help you achieve or maintain a healthy weight.  For females 20 years of age and older: ? A BMI below 18.5 is considered underweight. ? A BMI of 18.5 to 24.9 is normal. ? A BMI of 25 to 29.9 is considered overweight. ? A BMI of 30 and above is considered obese.  Watch levels of cholesterol and blood lipids  You should start having your blood tested for lipids and cholesterol at 61 years of age, then have this test every 5 years.  You may need to have your cholesterol levels checked more often if: ? Your lipid or  cholesterol levels are high. ? You are older than 61 years of age. ? You are at high risk for heart disease.  Cancer screening Lung Cancer  Lung cancer screening is recommended for adults 55-80 years old who are at high risk for lung cancer because of a history of smoking.  A yearly low-dose CT scan of the lungs is recommended for people who: ? Currently smoke. ? Have quit within the past 15 years. ? Have at least a 30-pack-year history of smoking. A pack year is smoking an average of one pack of cigarettes a day for 1 year.  Yearly screening should continue until it has been 15 years since you quit.  Yearly screening should stop if you develop a health problem that would prevent you from having lung cancer treatment.  Breast Cancer  Practice breast self-awareness. This means understanding how your breasts normally appear and feel.  It also means doing regular breast self-exams. Let your health care provider know about any changes, no matter how small.  If you are in your 20s or 30s, you should have a clinical breast exam (CBE) by a health care provider every 1-3 years as part of a regular health exam.  If you are 40 or older, have a CBE every year. Also consider having a breast X-ray (mammogram) every year.  If you have a family history of breast cancer, talk to your health care provider about genetic screening.  If you are at high risk   for breast cancer, talk to your health care provider about having an MRI and a mammogram every year.  Breast cancer gene (BRCA) assessment is recommended for women who have family members with BRCA-related cancers. BRCA-related cancers include: ? Breast. ? Ovarian. ? Tubal. ? Peritoneal cancers.  Results of the assessment will determine the need for genetic counseling and BRCA1 and BRCA2 testing.  Cervical Cancer Your health care provider may recommend that you be screened regularly for cancer of the pelvic organs (ovaries, uterus, and  vagina). This screening involves a pelvic examination, including checking for microscopic changes to the surface of your cervix (Pap test). You may be encouraged to have this screening done every 3 years, beginning at age 22.  For women ages 56-65, health care providers may recommend pelvic exams and Pap testing every 3 years, or they may recommend the Pap and pelvic exam, combined with testing for human papilloma virus (HPV), every 5 years. Some types of HPV increase your risk of cervical cancer. Testing for HPV may also be done on women of any age with unclear Pap test results.  Other health care providers may not recommend any screening for nonpregnant women who are considered low risk for pelvic cancer and who do not have symptoms. Ask your health care provider if a screening pelvic exam is right for you.  If you have had past treatment for cervical cancer or a condition that could lead to cancer, you need Pap tests and screening for cancer for at least 20 years after your treatment. If Pap tests have been discontinued, your risk factors (such as having a new sexual partner) need to be reassessed to determine if screening should resume. Some women have medical problems that increase the chance of getting cervical cancer. In these cases, your health care provider may recommend more frequent screening and Pap tests.  Colorectal Cancer  This type of cancer can be detected and often prevented.  Routine colorectal cancer screening usually begins at 61 years of age and continues through 61 years of age.  Your health care provider may recommend screening at an earlier age if you have risk factors for colon cancer.  Your health care provider may also recommend using home test kits to check for hidden blood in the stool.  A small camera at the end of a tube can be used to examine your colon directly (sigmoidoscopy or colonoscopy). This is done to check for the earliest forms of colorectal  cancer.  Routine screening usually begins at age 33.  Direct examination of the colon should be repeated every 5-10 years through 61 years of age. However, you may need to be screened more often if early forms of precancerous polyps or small growths are found.  Skin Cancer  Check your skin from head to toe regularly.  Tell your health care provider about any new moles or changes in moles, especially if there is a change in a mole's shape or color.  Also tell your health care provider if you have a mole that is larger than the size of a pencil eraser.  Always use sunscreen. Apply sunscreen liberally and repeatedly throughout the day.  Protect yourself by wearing long sleeves, pants, a wide-brimmed hat, and sunglasses whenever you are outside.  Heart disease, diabetes, and high blood pressure  High blood pressure causes heart disease and increases the risk of stroke. High blood pressure is more likely to develop in: ? People who have blood pressure in the high end of  the normal range (130-139/85-89 mm Hg). ? People who are overweight or obese. ? People who are African American.  If you are 21-29 years of age, have your blood pressure checked every 3-5 years. If you are 3 years of age or older, have your blood pressure checked every year. You should have your blood pressure measured twice-once when you are at a hospital or clinic, and once when you are not at a hospital or clinic. Record the average of the two measurements. To check your blood pressure when you are not at a hospital or clinic, you can use: ? An automated blood pressure machine at a pharmacy. ? A home blood pressure monitor.  If you are between 17 years and 37 years old, ask your health care provider if you should take aspirin to prevent strokes.  Have regular diabetes screenings. This involves taking a blood sample to check your fasting blood sugar level. ? If you are at a normal weight and have a low risk for diabetes,  have this test once every three years after 61 years of age. ? If you are overweight and have a high risk for diabetes, consider being tested at a younger age or more often. Preventing infection Hepatitis B  If you have a higher risk for hepatitis B, you should be screened for this virus. You are considered at high risk for hepatitis B if: ? You were born in a country where hepatitis B is common. Ask your health care provider which countries are considered high risk. ? Your parents were born in a high-risk country, and you have not been immunized against hepatitis B (hepatitis B vaccine). ? You have HIV or AIDS. ? You use needles to inject street drugs. ? You live with someone who has hepatitis B. ? You have had sex with someone who has hepatitis B. ? You get hemodialysis treatment. ? You take certain medicines for conditions, including cancer, organ transplantation, and autoimmune conditions.  Hepatitis C  Blood testing is recommended for: ? Everyone born from [birth year] through 1964. ? Anyone with known risk factors for hepatitis C.  Sexually transmitted infections (STIs)  You should be screened for sexually transmitted infections (STIs) including gonorrhea and chlamydia if: ? You are sexually active and are younger than 61 years of age. ? You are older than 61 years of age and your health care provider tells you that you are at risk for this type of infection. ? Your sexual activity has changed since you were last screened and you are at an increased risk for chlamydia or gonorrhea. Ask your health care provider if you are at risk.  If you do not have HIV, but are at risk, it may be recommended that you take a prescription medicine daily to prevent HIV infection. This is called pre-exposure prophylaxis (PrEP). You are considered at risk if: ? You are sexually active and do not regularly use condoms or know the HIV status of your partner(s). ? You take drugs by injection. ? You are  sexually active with a partner who has HIV.  Talk with your health care provider about whether you are at high risk of being infected with HIV. If you choose to begin PrEP, you should first be tested for HIV. You should then be tested every 3 months for as long as you are taking PrEP. Pregnancy  If you are premenopausal and you may become pregnant, ask your health care provider about preconception counseling.  If you may become  pregnant, take 400 to 800 micrograms (mcg) of folic acid every day.  If you want to prevent pregnancy, talk to your health care provider about birth control (contraception). Osteoporosis and menopause  Osteoporosis is a disease in which the bones lose minerals and strength with aging. This can result in serious bone fractures. Your risk for osteoporosis can be identified using a bone density scan.  If you are 28 years of age or older, or if you are at risk for osteoporosis and fractures, ask your health care provider if you should be screened.  Ask your health care provider whether you should take a calcium or vitamin D supplement to lower your risk for osteoporosis.  Menopause may have certain physical symptoms and risks.  Hormone replacement therapy may reduce some of these symptoms and risks. Talk to your health care provider about whether hormone replacement therapy is right for you. Follow these instructions at home:  Schedule regular health, dental, and eye exams.  Stay current with your immunizations.  Do not use any tobacco products including cigarettes, chewing tobacco, or electronic cigarettes.  If you are pregnant, do not drink alcohol.  If you are breastfeeding, limit how much and how often you drink alcohol.  Limit alcohol intake to no more than 1 drink per day for nonpregnant women. One drink equals 12 ounces of beer, 5 ounces of wine, or 1 ounces of hard liquor.  Do not use street drugs.  Do not share needles.  Ask your health care  provider for help if you need support or information about quitting drugs.  Tell your health care provider if you often feel depressed.  Tell your health care provider if you have ever been abused or do not feel safe at home. This information is not intended to replace advice given to you by your health care provider. Make sure you discuss any questions you have with your health care provider. Document Released: 05/31/2011 Document Revised: 04/22/2016 Document Reviewed: 08/19/2015 Elsevier Interactive Patient Education  2018 [place] Maintenance for Postmenopausal Women Menopause is a normal process in which your reproductive ability comes to an end. This process happens gradually over a span of months to years, usually between the ages of 96 and 42. Menopause is complete when you have missed 12 consecutive menstrual periods. It is important to talk with your health care provider about some of the most common conditions that affect postmenopausal women, such as heart disease, cancer, and bone loss (osteoporosis). Adopting a healthy lifestyle and getting preventive care can help to promote your health and wellness. Those actions can also lower your chances of developing some of these common conditions. What should I know about menopause? During menopause, you may experience a number of symptoms, such as:  Moderate-to-severe hot flashes.  Night sweats.  Decrease in sex drive.  Mood swings.  Headaches.  Tiredness.  Irritability.  Memory problems.  Insomnia.  Choosing to treat or not to treat menopausal changes is an individual decision that you make with your health care provider. What should I know about hormone replacement therapy and supplements? Hormone therapy products are effective for treating symptoms that are associated with menopause, such as hot flashes and night sweats. Hormone replacement carries certain risks, especially as you become older. If you are  thinking about using estrogen or estrogen with progestin treatments, discuss the benefits and risks with your health care provider. What should I know about heart disease and stroke? Heart disease, heart attack, and stroke  become more likely as you age. This may be due, in part, to the hormonal changes that your body experiences during menopause. These can affect how your body processes dietary fats, triglycerides, and cholesterol. Heart attack and stroke are both medical emergencies. There are many things that you can do to help prevent heart disease and stroke:  Have your blood pressure checked at least every 1-2 years. High blood pressure causes heart disease and increases the risk of stroke.  If you are 55-79 years old, ask your health care provider if you should take aspirin to prevent a heart attack or a stroke.  Do not use any tobacco products, including cigarettes, chewing tobacco, or electronic cigarettes. If you need help quitting, ask your health care provider.  It is important to eat a healthy diet and maintain a healthy weight. ? Be sure to include plenty of vegetables, fruits, low-fat dairy products, and lean protein. ? Avoid eating foods that are high in solid fats, added sugars, or salt (sodium).  Get regular exercise. This is one of the most important things that you can do for your health. ? Try to exercise for at least 150 minutes each week. The type of exercise that you do should increase your heart rate and make you sweat. This is known as moderate-intensity exercise. ? Try to do strengthening exercises at least twice each week. Do these in addition to the moderate-intensity exercise.  Know your numbers.Ask your health care provider to check your cholesterol and your blood glucose. Continue to have your blood tested as directed by your health care provider.  What should I know about cancer screening? There are several types of cancer. Take the following steps to reduce  your risk and to catch any cancer development as early as possible. Breast Cancer  Practice breast self-awareness. ? This means understanding how your breasts normally appear and feel. ? It also means doing regular breast self-exams. Let your health care provider know about any changes, no matter how small.  If you are 40 or older, have a clinician do a breast exam (clinical breast exam or CBE) every year. Depending on your age, family history, and medical history, it may be recommended that you also have a yearly breast X-ray (mammogram).  If you have a family history of breast cancer, talk with your health care provider about genetic screening.  If you are at high risk for breast cancer, talk with your health care provider about having an MRI and a mammogram every year.  Breast cancer (BRCA) gene test is recommended for women who have family members with BRCA-related cancers. Results of the assessment will determine the need for genetic counseling and BRCA1 and for BRCA2 testing. BRCA-related cancers include these types: ? Breast. This occurs in males or females. ? Ovarian. ? Tubal. This may also be called fallopian tube cancer. ? Cancer of the abdominal or pelvic lining (peritoneal cancer). ? Prostate. ? Pancreatic.  Cervical, Uterine, and Ovarian Cancer Your health care provider may recommend that you be screened regularly for cancer of the pelvic organs. These include your ovaries, uterus, and vagina. This screening involves a pelvic exam, which includes checking for microscopic changes to the surface of your cervix (Pap test).  For women ages 21-65, health care providers may recommend a pelvic exam and a Pap test every three years. For women ages 30-65, they may recommend the Pap test and pelvic exam, combined with testing for human papilloma virus (HPV), every five years. Some types   of HPV increase your risk of cervical cancer. Testing for HPV may also be done on women of any age who  have unclear Pap test results.  Other health care providers may not recommend any screening for nonpregnant women who are considered low risk for pelvic cancer and have no symptoms. Ask your health care provider if a screening pelvic exam is right for you.  If you have had past treatment for cervical cancer or a condition that could lead to cancer, you need Pap tests and screening for cancer for at least 20 years after your treatment. If Pap tests have been discontinued for you, your risk factors (such as having a new sexual partner) need to be reassessed to determine if you should start having screenings again. Some women have medical problems that increase the chance of getting cervical cancer. In these cases, your health care provider may recommend that you have screening and Pap tests more often.  If you have a family history of uterine cancer or ovarian cancer, talk with your health care provider about genetic screening.  If you have vaginal bleeding after reaching menopause, tell your health care provider.  There are currently no reliable tests available to screen for ovarian cancer.  Lung Cancer Lung cancer screening is recommended for adults 20-34 years old who are at high risk for lung cancer because of a history of smoking. A yearly low-dose CT scan of the lungs is recommended if you:  Currently smoke.  Have a history of at least 30 pack-years of smoking and you currently smoke or have quit within the past 15 years. A pack-year is smoking an average of one pack of cigarettes per day for one year.  Yearly screening should:  Continue until it has been 15 years since you quit.  Stop if you develop a health problem that would prevent you from having lung cancer treatment.  Colorectal Cancer  This type of cancer can be detected and can often be prevented.  Routine colorectal cancer screening usually begins at age 91 and continues through age 40.  If you have risk factors for colon  cancer, your health care provider may recommend that you be screened at an earlier age.  If you have a family history of colorectal cancer, talk with your health care provider about genetic screening.  Your health care provider may also recommend using home test kits to check for hidden blood in your stool.  A small camera at the end of a tube can be used to examine your colon directly (sigmoidoscopy or colonoscopy). This is done to check for the earliest forms of colorectal cancer.  Direct examination of the colon should be repeated every 5-10 years until age 59. However, if early forms of precancerous polyps or small growths are found or if you have a family history or genetic risk for colorectal cancer, you may need to be screened more often.  Skin Cancer  Check your skin from head to toe regularly.  Monitor any moles. Be sure to tell your health care provider: ? About any new moles or changes in moles, especially if there is a change in a mole's shape or color. ? If you have a mole that is larger than the size of a pencil eraser.  If any of your family members has a history of skin cancer, especially at a young age, talk with your health care provider about genetic screening.  Always use sunscreen. Apply sunscreen liberally and repeatedly throughout the day.  Whenever you are outside, protect yourself by wearing long sleeves, pants, a wide-brimmed hat, and sunglasses.  What should I know about osteoporosis? Osteoporosis is a condition in which bone destruction happens more quickly than new bone creation. After menopause, you may be at an increased risk for osteoporosis. To help prevent osteoporosis or the bone fractures that can happen because of osteoporosis, the following is recommended:  If you are 13-49 years old, get at least 1,000 mg of calcium and at least 600 mg of vitamin D per day.  If you are older than age 83 but younger than age 38, get at least 1,200 mg of calcium and  at least 600 mg of vitamin D per day.  If you are older than age 77, get at least 1,200 mg of calcium and at least 800 mg of vitamin D per day.  Smoking and excessive alcohol intake increase the risk of osteoporosis. Eat foods that are rich in calcium and vitamin D, and do weight-bearing exercises several times each week as directed by your health care provider. What should I know about how menopause affects my mental health? Depression may occur at any age, but it is more common as you become older. Common symptoms of depression include:  Low or sad mood.  Changes in sleep patterns.  Changes in appetite or eating patterns.  Feeling an overall lack of motivation or enjoyment of activities that you previously enjoyed.  Frequent crying spells.  Talk with your health care provider if you think that you are experiencing depression. What should I know about immunizations? It is important that you get and maintain your immunizations. These include:  Tetanus, diphtheria, and pertussis (Tdap) booster vaccine.  Influenza every year before the flu season begins.  Pneumonia vaccine.  Shingles vaccine.  Your health care provider may also recommend other immunizations. This information is not intended to replace advice given to you by your health care provider. Make sure you discuss any questions you have with your health care provider. Document Released: 01/07/2006 Document Revised: 06/04/2016 Document Reviewed: 08/19/2015 Elsevier Interactive Patient Education  2018 [employer].

## 2018-02-01 LAB — LIPID PANEL W/O CHOL/HDL RATIO
CHOLESTEROL TOTAL: 150 mg/dL (ref 100–199)
HDL: 54 mg/dL (ref >39)
LDL CALC: 66 mg/dL (ref 0–99)
Triglycerides: 148 mg/dL (ref 0–149)
VLDL Cholesterol Cal: 30 mg/dL (ref 5–40)

## 2018-02-01 LAB — CBC WITH DIFFERENTIAL/PLATELET
BASOS: 1 %
Basophils Absolute: 0 10*3/uL (ref 0.0–0.2)
EOS (ABSOLUTE): 0.3 10*3/uL (ref 0.0–0.4)
EOS: 6 %
HEMATOCRIT: 42.6 % (ref 34.0–46.6)
HEMOGLOBIN: 14 g/dL (ref 11.1–15.9)
Immature Grans (Abs): 0 10*3/uL (ref 0.0–0.1)
Immature Granulocytes: 0 %
LYMPHS ABS: 0.8 10*3/uL (ref 0.7–3.1)
Lymphs: 18 %
MCH: 29.2 pg (ref 26.6–33.0)
MCHC: 32.9 g/dL (ref 31.5–35.7)
MCV: 89 fL (ref 79–97)
MONOCYTES: 7 %
MONOS ABS: 0.3 10*3/uL (ref 0.1–0.9)
NEUTROS ABS: 3.1 10*3/uL (ref 1.4–7.0)
Neutrophils: 68 %
Platelets: 242 10*3/uL (ref 150–379)
RBC: 4.8 x10E6/uL (ref 3.77–5.28)
RDW: 15.2 % (ref 12.3–15.4)
WBC: 4.5 10*3/uL (ref 3.4–10.8)

## 2018-02-01 LAB — COMPREHENSIVE METABOLIC PANEL
A/G RATIO: 1.8 (ref 1.2–2.2)
ALBUMIN: 4.4 g/dL (ref 3.6–4.8)
ALK PHOS: 42 IU/L (ref 39–117)
ALT: 37 IU/L — ABNORMAL HIGH (ref 0–32)
AST: 21 IU/L (ref 0–40)
BUN / CREAT RATIO: 22 (ref 12–28)
BUN: 19 mg/dL (ref 8–27)
Bilirubin Total: 0.5 mg/dL (ref 0.0–1.2)
CO2: 21 mmol/L (ref 20–29)
CREATININE: 0.87 mg/dL (ref 0.57–1.00)
Calcium: 9.7 mg/dL (ref 8.7–10.3)
Chloride: 98 mmol/L (ref 96–106)
GFR calc Af Amer: 84 mL/min/{1.73_m2} (ref >59)
GFR calc non Af Amer: 73 mL/min/{1.73_m2} (ref >59)
GLOBULIN, TOTAL: 2.5 g/dL (ref 1.5–4.5)
Glucose: 200 mg/dL — ABNORMAL HIGH (ref 65–99)
POTASSIUM: 4.3 mmol/L (ref 3.5–5.2)
SODIUM: 139 mmol/L (ref 134–144)
Total Protein: 6.9 g/dL (ref 6.0–8.5)

## 2018-02-01 LAB — TSH: TSH: 1.6 u[IU]/mL (ref 0.450–4.500)

## 2018-02-03 ENCOUNTER — Telehealth: Payer: Self-pay | Admitting: Family Medicine

## 2018-02-03 LAB — UA/M W/RFLX CULTURE, ROUTINE
Bilirubin, UA: NEGATIVE
Glucose, UA: NEGATIVE
Nitrite, UA: NEGATIVE
PH UA: 5.5 (ref 5.0–7.5)
Specific Gravity, UA: 1.025 (ref 1.005–1.030)
Urobilinogen, Ur: 0.2 mg/dL (ref 0.2–1.0)

## 2018-02-03 LAB — URINE CULTURE, REFLEX

## 2018-02-03 LAB — MICROSCOPIC EXAMINATION

## 2018-02-03 LAB — MICROALBUMIN, URINE WAIVED
Creatinine, Urine Waived: 300 mg/dL (ref 10–300)
MICROALB, UR WAIVED: 80 mg/L — AB (ref 0–19)

## 2018-02-03 MED ORDER — NITROFURANTOIN MONOHYD MACRO 100 MG PO CAPS: 100.0000 mg | ORAL_CAPSULE | Freq: Two times a day (BID) | ORAL | 0 refills | Status: DC

## 2018-02-03 NOTE — Telephone Encounter (Signed)
Patient notified

## 2018-02-03 NOTE — Telephone Encounter (Signed)
Please let her know that her urine grew out some bacteria, so I've sent an antibiotic to her pharmacy.

## 2018-02-04 ENCOUNTER — Other Ambulatory Visit: Payer: Self-pay | Admitting: Family Medicine

## 2018-02-16 IMAGING — MR MR SHOULDER*R* W/O CM
5 series · 40 of 40 positions shown · non-contrast
Comparison: None.

CLINICAL DATA: Shoulder pain with decreased range of motion and
weakness over the last 6 months. No response to physical therapy. No
acute injury or prior relevant surgery. Bicipital tendonitis.

EXAM:
MRI OF THE RIGHT SHOULDER WITHOUT CONTRAST
TECHNIQUE: Multiplanar, multisequence MR imaging of the shoulder was performed.
No intravenous contrast was administered.

[Series 3: T2 fat-sat · axial · 4.0mm · 0.59mm/px · z∈[-17,+63]mm · 8 of 20 slices shown (1 of 3)]
[im 1/20]
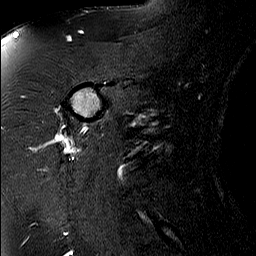
[im 3/20]
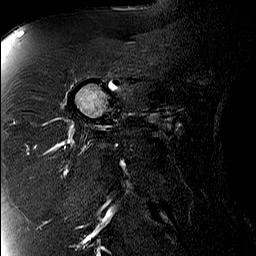
[im 6/20]
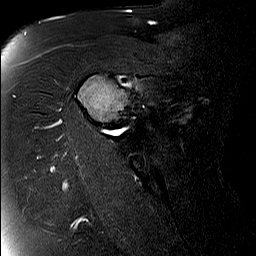
[im 9/20]
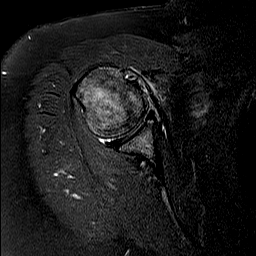
[im 11/20]
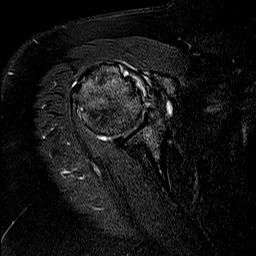
[im 14/20]
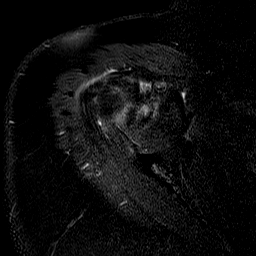
[im 17/20]
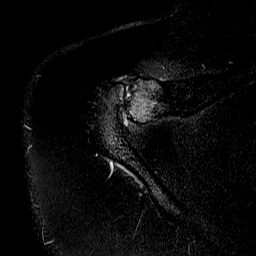
[im 20/20]
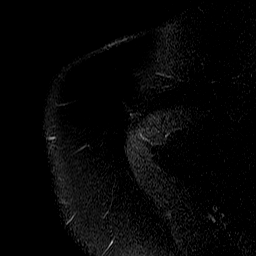

[Series 4: T2 fat-sat · oblique · 4.0mm · 0.47mm/px · 7 of 17 slices shown (2 of 3)]
[im 1/17]
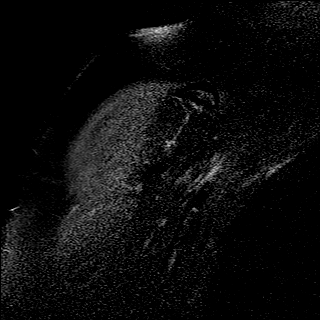
[im 3/17]
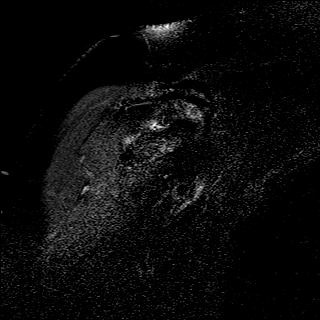
[im 6/17]
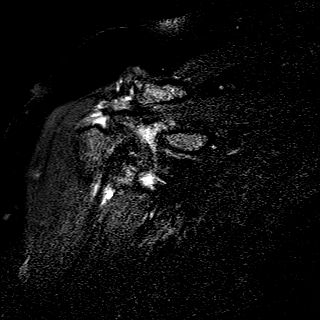
[im 9/17]
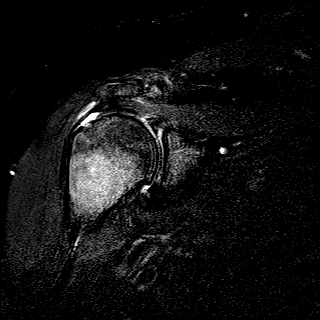
[im 11/17]
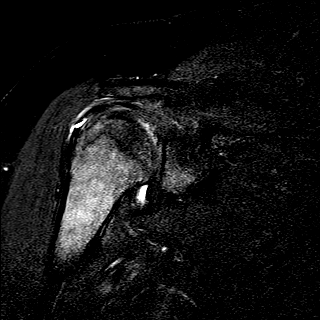
[im 14/17]
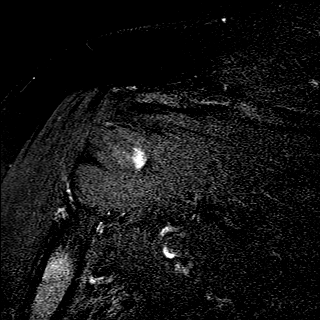
[im 17/17]
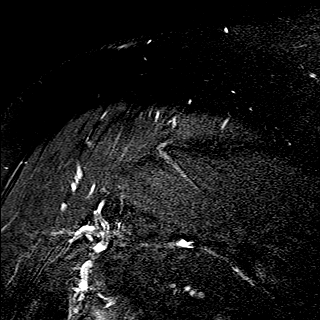

[Series 5: PD · oblique · 4.0mm · 0.59mm/px · 7 of 17 slices shown]
[im 1/17]
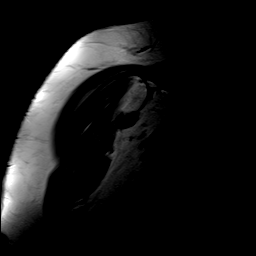
[im 3/17]
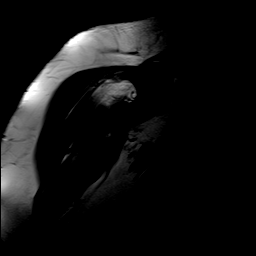
[im 6/17]
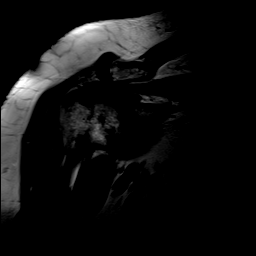
[im 9/17]
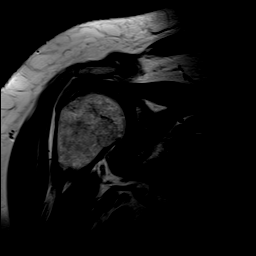
[im 11/17]
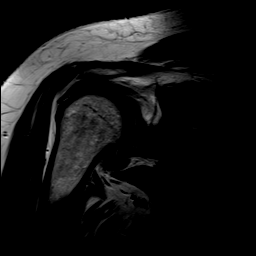
[im 14/17]
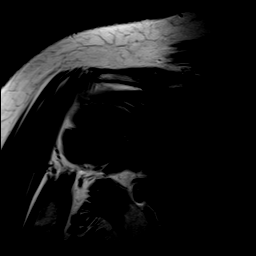
[im 17/17]
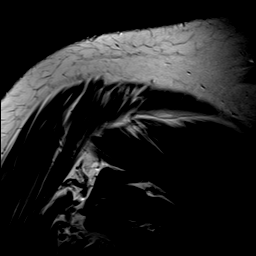

[Series 6: T1 · oblique · 4.0mm · 0.55mm/px · 9 of 20 slices shown]
[im 1/20]
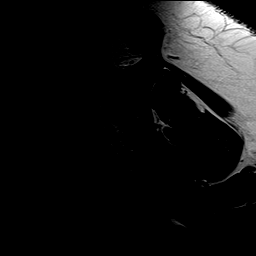
[im 3/20]
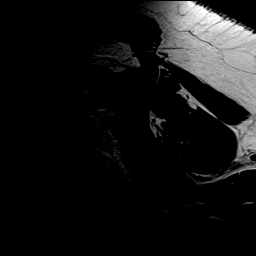
[im 5/20]
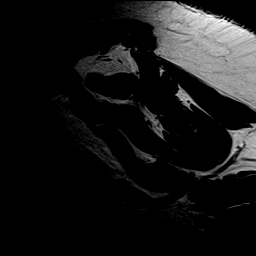
[im 8/20]
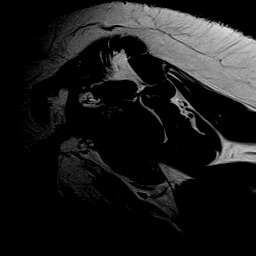
[im 10/20]
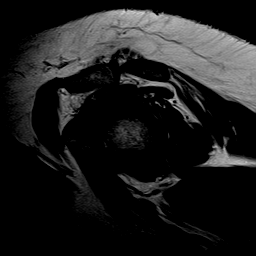
[im 12/20]
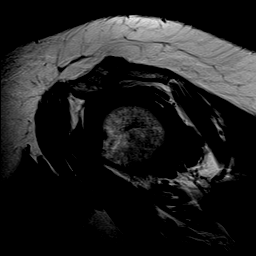
[im 15/20]
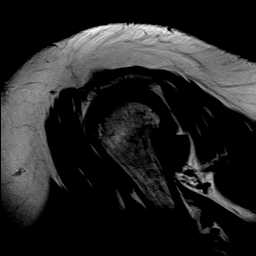
[im 17/20]
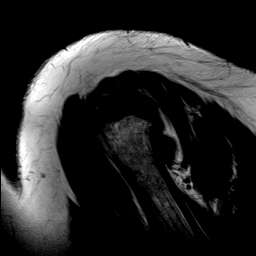
[im 20/20]
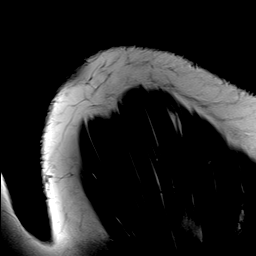

[Series 8: T2 fat-sat · oblique · 4.0mm · 0.55mm/px · 9 of 20 slices shown (3 of 3)]
[im 1/20]
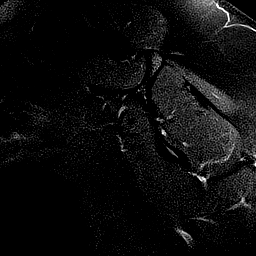
[im 3/20]
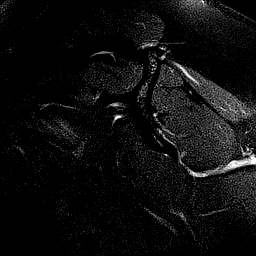
[im 5/20]
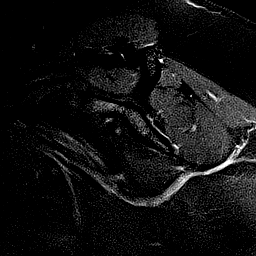
[im 8/20]
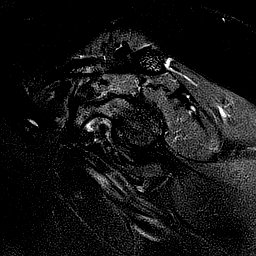
[im 10/20]
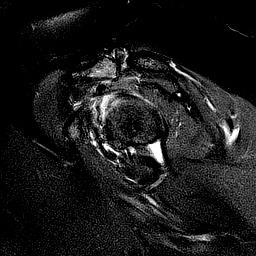
[im 12/20]
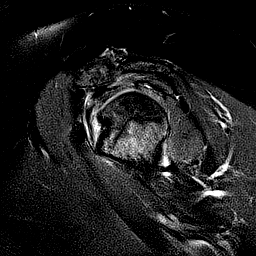
[im 15/20]
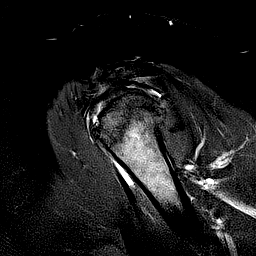
[im 17/20]
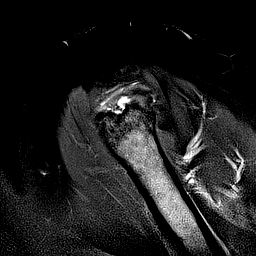
[im 20/20]
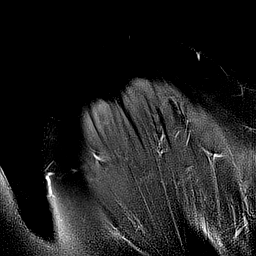

[40 of 40 positions shown; findings below may reference images not displayed]

FINDINGS: Rotator cuff: Diffuse rotator cuff tendinosis. There is an
intrasubstance insertional tear of the supraspinatus tendon which
appears high-grade, extending to the bursal surface on coronal image
7. There is no definite extension to the articular surface fibers,
although the tear involves more than 50% of the tendon thickness.
There is no tendon retraction. There is also sentinel cyst formation
in the distal infraspinatus tendon. There is subscapularis
tendinosis and attenuation without focal tear. The teres minor
tendon appears normal.

Muscles:  No focal muscular atrophy or edema.

Biceps long head: The intra-articular portion of the biceps tendon
appears attenuated with intrasubstance signal, suggesting at least
partial tearing. The tendon is normally positioned within the
bicipital groove.

Acromioclavicular Joint: The acromion is type 2. There are moderate
acromioclavicular degenerative changes. There is minimal fluid in
the subacromial -subdeltoid bursa.

Glenohumeral Joint: No significant shoulder joint effusion or
glenohumeral arthropathy.There is mild joint capsular thickening in
the axillary recess.

Labrum: There is irregular signal in the superior labrum, extending
anteriorly from the biceps anchor. No evidence of paralabral cyst.

Bones: No acute or significant extra-articular osseous findings.
IMPRESSION: 1. Diffuse rotator cuff tendinosis with high-grade, nearly complete
insertional tear of the supraspinatus tendon as described.
2. Probable smaller foci of partial tearing of the distal
infraspinatus and subscapularis tendons.
3. Prominent degenerative signal in the superior labrum with
tendinosis and probable partial tearing of the biceps tendon.

## 2018-04-19 ENCOUNTER — Encounter: Payer: Self-pay | Admitting: Family Medicine

## 2018-06-02 ENCOUNTER — Ambulatory Visit: Payer: No Typology Code available for payment source | Admitting: Family Medicine

## 2018-06-07 LAB — HEMOGLOBIN A1C: Hemoglobin A1C: 9.2

## 2018-06-09 ENCOUNTER — Encounter: Payer: Self-pay | Admitting: Family Medicine

## 2018-06-09 ENCOUNTER — Ambulatory Visit (INDEPENDENT_AMBULATORY_CARE_PROVIDER_SITE_OTHER): Payer: No Typology Code available for payment source | Admitting: Family Medicine

## 2018-06-09 VITALS — BP 137/82 | HR 92 | Wt 242.0 lb

## 2018-06-09 DIAGNOSIS — M069 Rheumatoid arthritis, unspecified: Secondary | ICD-10-CM | POA: Insufficient documentation

## 2018-06-09 DIAGNOSIS — H66002 Acute suppurative otitis media without spontaneous rupture of ear drum, left ear: Secondary | ICD-10-CM

## 2018-06-09 DIAGNOSIS — M059 Rheumatoid arthritis with rheumatoid factor, unspecified: Secondary | ICD-10-CM | POA: Diagnosis not present

## 2018-06-09 MED ORDER — DOXYCYCLINE HYCLATE 100 MG PO TABS: 100.0000 mg | ORAL_TABLET | Freq: Two times a day (BID) | ORAL | 0 refills | Status: DC

## 2018-06-09 MED ORDER — LISINOPRIL 20 MG PO TABS: 20.0000 mg | ORAL_TABLET | Freq: Every day | ORAL | 1 refills | Status: DC

## 2018-06-09 MED ORDER — PAROXETINE HCL 20 MG PO TABS: 20.0000 mg | ORAL_TABLET | Freq: Every day | ORAL | 1 refills | Status: DC

## 2018-06-09 MED ORDER — AMOXICILLIN-POT CLAVULANATE 875-125 MG PO TABS: 1.0000 | ORAL_TABLET | Freq: Two times a day (BID) | ORAL | 0 refills | Status: DC

## 2018-06-09 NOTE — Progress Notes (Signed)
BP 137/82   Pulse 92   Wt 242 lb (109.8 kg)   LMP 03/17/2013 (Approximate)   SpO2 97%   BMI 45.73 kg/m    Subjective:    Patient ID: Sierra Copeland, female    DOB: 11-26-1956, 62 y.o.   MRN: [id number]  HPI: RUDI KNIPPENBERG is a 62 y.o. female  Chief Complaint  Patient presents with  . Ear Pain    Left ear pain x 1 week  . Form Completion    Handicap Placard. Prefilled in office.    EAR PAIN Duration: 1 week Involved ear(s): left Severity:  moderate  Quality:  Aching and dull Fever: no Otorrhea: no Upper respiratory infection symptoms: no Pruritus: no Hearing loss: no Water immersion no Using Q-tips: yes Recurrent otitis media: no Status: stable Treatments attempted: none  Relevant past medical, surgical, family and social history reviewed and updated as indicated. Interim medical history since our last visit reviewed. Allergies and medications reviewed and updated.  Review of Systems  Constitutional: Negative.   HENT: Positive for congestion and ear pain. Negative for dental problem, drooling, ear discharge, facial swelling, hearing loss, mouth sores, nosebleeds, postnasal drip, rhinorrhea, sinus pressure, sinus pain, sneezing, sore throat, tinnitus, trouble swallowing and voice change.   Respiratory: Negative.   Cardiovascular: Negative.   Neurological: Negative.   Psychiatric/Behavioral: Negative.     Per HPI unless specifically indicated above     Objective:    BP 137/82   Pulse 92   Wt 242 lb (109.8 kg)   LMP 03/17/2013 (Approximate)   SpO2 97%   BMI 45.73 kg/m   Wt Readings from Last 3 Encounters:  06/09/18 242 lb (109.8 kg)  01/31/18 237 lb (107.5 kg)  08/30/17 227 lb 9.6 oz (103.2 kg)    Physical Exam  Constitutional: She is oriented to person, place, and time. She appears well-developed and well-nourished. No distress.  HENT:  Head: Normocephalic and atraumatic.  Right Ear: Hearing, tympanic membrane, external ear and ear canal normal.    Left Ear: Hearing, external ear and ear canal normal. Tympanic membrane is erythematous. A middle ear effusion is present.  Nose: Nose normal.  Mouth/Throat: Oropharynx is clear and moist. No oropharyngeal exudate.  Eyes: Pupils are equal, round, and reactive to light. Conjunctivae, EOM and lids are normal. Right eye exhibits no discharge. Left eye exhibits no discharge. No scleral icterus.  Neck: Normal range of motion. Neck supple. No JVD present. No tracheal deviation present. No thyromegaly present.  Cardiovascular: Normal rate, regular rhythm, normal heart sounds and intact distal pulses. Exam reveals no gallop and no friction rub.  No murmur heard. Pulmonary/Chest: Effort normal and breath sounds normal. No stridor. No respiratory distress. She has no wheezes. She has no rales. She exhibits no tenderness.  Musculoskeletal: Normal range of motion.  Lymphadenopathy:    She has no cervical adenopathy.  Neurological: She is alert and oriented to person, place, and time.  Skin: Skin is warm, dry and intact. Capillary refill takes less than 2 seconds. No rash noted. She is not diaphoretic. No erythema. No pallor.  Psychiatric: She has a normal mood and affect. Her speech is normal and behavior is normal. Judgment and thought content normal. Cognition and memory are normal.  Nursing note and vitals reviewed.   Results for orders placed or performed in visit on 06/09/18  Hemoglobin A1c  Result Value Ref Range   Hemoglobin A1C 9.2       Assessment & Plan:  Problem List Items Addressed This Visit      Musculoskeletal and Integument   Rheumatoid arthritis ([place])    Continue to follow with rheumatology. No doing great on her methotrexate. Will fill out form for handicapped placquard.      Relevant Medications   methotrexate 50 MG/2ML injection    Other Visit Diagnoses    Non-recurrent acute suppurative otitis media of left ear without spontaneous rupture of tympanic membrane    -   Primary   Will treat with doxy. Call with any concerns. Continue to monitor.    Relevant Medications   doxycycline (VIBRA-TABS) 100 MG tablet       Follow up plan: Return As scheduled.

## 2018-06-09 NOTE — Assessment & Plan Note (Signed)
Continue to follow with rheumatology. No doing great on her methotrexate. Will fill out form for handicapped placquard.

## 2018-07-12 ENCOUNTER — Other Ambulatory Visit: Payer: Self-pay | Admitting: Physical Medicine and Rehabilitation

## 2018-07-12 DIAGNOSIS — M5416 Radiculopathy, lumbar region: Secondary | ICD-10-CM

## 2018-07-24 ENCOUNTER — Telehealth: Payer: Self-pay | Admitting: Family Medicine

## 2018-07-24 MED ORDER — SIMVASTATIN 40 MG PO TABS: 40.0000 mg | ORAL_TABLET | Freq: Every day | ORAL | 1 refills | Status: DC

## 2018-07-24 NOTE — Telephone Encounter (Signed)
Pt came in to see if prescription for simvastatin (ZOCOR) 40 MG   Can be sent to [facility]. Please advise.

## 2018-07-24 NOTE — Addendum Note (Signed)
Addended by: Valerie Roys on: 07/24/2018 02:16 PM   Modules accepted: Orders

## 2018-08-03 ENCOUNTER — Encounter: Payer: Self-pay | Admitting: Family Medicine

## 2018-08-03 ENCOUNTER — Ambulatory Visit (INDEPENDENT_AMBULATORY_CARE_PROVIDER_SITE_OTHER): Payer: No Typology Code available for payment source | Admitting: Family Medicine

## 2018-08-03 ENCOUNTER — Other Ambulatory Visit: Payer: Self-pay

## 2018-08-03 VITALS — BP 146/88 | HR 73 | Temp 98.6°F | Ht 61.0 in | Wt 242.5 lb

## 2018-08-03 DIAGNOSIS — H6992 Unspecified Eustachian tube disorder, left ear: Secondary | ICD-10-CM

## 2018-08-03 DIAGNOSIS — E1165 Type 2 diabetes mellitus with hyperglycemia: Secondary | ICD-10-CM

## 2018-08-03 DIAGNOSIS — H6982 Other specified disorders of Eustachian tube, left ear: Secondary | ICD-10-CM

## 2018-08-03 DIAGNOSIS — I129 Hypertensive chronic kidney disease with stage 1 through stage 4 chronic kidney disease, or unspecified chronic kidney disease: Secondary | ICD-10-CM | POA: Diagnosis not present

## 2018-08-03 DIAGNOSIS — F3342 Major depressive disorder, recurrent, in full remission: Secondary | ICD-10-CM | POA: Diagnosis not present

## 2018-08-03 DIAGNOSIS — E7800 Pure hypercholesterolemia, unspecified: Secondary | ICD-10-CM

## 2018-08-03 DIAGNOSIS — Z23 Encounter for immunization: Secondary | ICD-10-CM

## 2018-08-03 MED ORDER — LISINOPRIL 20 MG PO TABS: 20.0000 mg | ORAL_TABLET | Freq: Every day | ORAL | 1 refills | Status: DC

## 2018-08-03 MED ORDER — PAROXETINE HCL 20 MG PO TABS: 20.0000 mg | ORAL_TABLET | Freq: Every day | ORAL | 1 refills | Status: DC

## 2018-08-03 MED ORDER — SIMVASTATIN 40 MG PO TABS: 40.0000 mg | ORAL_TABLET | Freq: Every day | ORAL | 1 refills | Status: DC

## 2018-08-03 MED ORDER — PREDNISONE 20 MG PO TABS: 20.0000 mg | ORAL_TABLET | Freq: Every day | ORAL | 0 refills | Status: DC

## 2018-08-03 NOTE — Assessment & Plan Note (Signed)
Under good control on current regimen. Continue current regimen. Continue to monitor. Call with any concerns. Refills given.   

## 2018-08-03 NOTE — Assessment & Plan Note (Signed)
Continue to follow with endocrinology. Call with any concerns. Continue to monitor.  ?

## 2018-08-03 NOTE — Assessment & Plan Note (Signed)
Under good control. Continue current regimen. Continue to monitor. Call with any concerns. Continue to monitor.

## 2018-08-03 NOTE — Progress Notes (Signed)
BP (!) 146/88   Pulse 73   Temp 98.6 F (37 C) (Oral)   Ht 5\' 1"  (1.549 m)   Wt 242 lb 8 oz (110 kg)   LMP 03/17/2013 (Approximate)   SpO2 96%   BMI 45.82 kg/m    Subjective:    Patient ID: Sierra Copeland, female    DOB: [birth year], 62 y.o.   MRN: [id number]  HPI: Sierra Copeland is a 62 y.o. female  Chief Complaint  Patient presents with  . Diabetes    6 m f/u  . Hypertension  . Depression  . Hyperlipidemia   HYPERTENSION / HYPERLIPIDEMIA Satisfied with current treatment? yes Duration of hypertension: chronic BP monitoring frequency: not checking BP medication side effects: no Past BP meds: lisinopril  Duration of hyperlipidemia: chronic Cholesterol medication side effects: no Cholesterol supplements: none Past cholesterol medications: simvastatin Medication compliance: excellent compliance Aspirin: yes Recent stressors: no Recurrent headaches: no Visual changes: no Palpitations: no Dyspnea: no Chest pain: no Lower extremity edema: no Dizzy/lightheaded: no  DEPRESSION Mood status: controlled Satisfied with current treatment?: yes Symptom severity: mild  Duration of current treatment : chronic Side effects: no Medication compliance: excellent compliance Psychotherapy/counseling: no  Previous psychiatric medications: paxil Depressed mood: no Anxious mood: no Anhedonia: no Significant weight loss or gain: no Insomnia: no  Fatigue: no Feelings of worthlessness or guilt: no Impaired concentration/indecisiveness: no Suicidal ideations: no Hopelessness: no Crying spells: no Depression screen [facility] 2/9 08/03/2018 06/09/2018 01/31/2018 08/03/2017 07/12/2017  Decreased Interest 0 0 0 0 0  Down, Depressed, Hopeless 0 0 0 0 0  PHQ - 2 Score 0 0 0 0 0  Altered sleeping 0 - 1 1 1   Tired, decreased energy 1 - 2 1 1   Change in appetite 0 - 0 0 0  Feeling bad or failure about yourself  0 - 0 0 0  Trouble concentrating 0 - 0 0 0  Moving slowly or fidgety/restless 0 - 0  0 0  Suicidal thoughts 0 - 0 0 0  PHQ-9 Score 1 - 3 2 2   Difficult doing work/chores Not difficult at all - - - Not difficult at all   DM- follows with endocrinology  Relevant past medical, surgical, family and social history reviewed and updated as indicated. Interim medical history since our last visit reviewed. Allergies and medications reviewed and updated.  Review of Systems  Constitutional: Negative.   Respiratory: Negative.   Cardiovascular: Negative.   Musculoskeletal: Positive for arthralgias. Negative for back pain, gait problem, joint swelling, myalgias, neck pain and neck stiffness.  Skin: Negative.   Neurological: Negative.   Psychiatric/Behavioral: Negative.     Per HPI unless specifically indicated above     Objective:    BP (!) 146/88   Pulse 73   Temp 98.6 F (37 C) (Oral)   Ht 5\' 1"  (1.549 m)   Wt 242 lb 8 oz (110 kg)   LMP 03/17/2013 (Approximate)   SpO2 96%   BMI 45.82 kg/m   Wt Readings from Last 3 Encounters:  08/03/18 242 lb 8 oz (110 kg)  06/09/18 242 lb (109.8 kg)  01/31/18 237 lb (107.5 kg)    Physical Exam  Constitutional: She is oriented to person, place, and time. She appears well-developed and well-nourished. No distress.  HENT:  Head: Normocephalic and atraumatic.  Right Ear: Hearing, tympanic membrane, external ear and ear canal normal.  Left Ear: Hearing, tympanic membrane, external ear and ear canal normal.  Nose: Nose  normal.  Mouth/Throat: Uvula is midline, oropharynx is clear and moist and mucous membranes are normal. No oropharyngeal exudate.  Eyes: Pupils are equal, round, and reactive to light. Conjunctivae, EOM and lids are normal. Right eye exhibits no discharge. Left eye exhibits no discharge. No scleral icterus.  Neck: Normal range of motion. Neck supple. No JVD present. No tracheal deviation present. No thyromegaly present.  Cardiovascular: Normal rate, regular rhythm, normal heart sounds and intact distal pulses. Exam  reveals no gallop and no friction rub.  No murmur heard. Pulmonary/Chest: Effort normal and breath sounds normal. No stridor. No respiratory distress. She has no wheezes. She has no rales. She exhibits no tenderness.  Musculoskeletal: Normal range of motion.  Lymphadenopathy:    She has no cervical adenopathy.  Neurological: She is alert and oriented to person, place, and time.  Skin: Skin is warm, dry and intact. Capillary refill takes less than 2 seconds. No rash noted. She is not diaphoretic. No erythema. No pallor.  Psychiatric: She has a normal mood and affect. Her speech is normal and behavior is normal. Judgment and thought content normal. Cognition and memory are normal.  Nursing note and vitals reviewed.   Results for orders placed or performed in visit on 06/09/18  Hemoglobin A1c  Result Value Ref Range   Hemoglobin A1C 9.2       Assessment & Plan:   Problem List Items Addressed This Visit      Endocrine   Type II diabetes mellitus, uncontrolled ([place])    Continue to follow with endocrinology. Call with any concerns. Continue to monitor.       Relevant Medications   lisinopril (PRINIVIL,ZESTRIL) 20 MG tablet   simvastatin (ZOCOR) 40 MG tablet   Other Relevant Orders   Comprehensive metabolic panel     Genitourinary   Benign hypertensive renal disease    Under good control. Continue current regimen. Continue to monitor. Call with any concerns. Continue to monitor.       Relevant Orders   Comprehensive metabolic panel     Other   Hypercholesteremia    Under good control on current regimen. Continue current regimen. Continue to monitor. Call with any concerns. Refills given.        Relevant Medications   lisinopril (PRINIVIL,ZESTRIL) 20 MG tablet   simvastatin (ZOCOR) 40 MG tablet   Other Relevant Orders   Comprehensive metabolic panel   Lipid Panel w/o Chol/HDL Ratio   Depression - Primary    Under good control on current regimen. Continue current regimen.  Continue to monitor. Call with any concerns. Refills given.        Relevant Medications   PARoxetine (PAXIL) 20 MG tablet    Other Visit Diagnoses    Flu vaccine need       Flu shot given today.   ETD (Eustachian tube dysfunction), left       Will treat with burst of prednisone. Let me know if not getting better or getting worse.        Follow up plan: Return in about 6 months (around 02/01/2019) for Physical.

## 2018-08-04 LAB — LIPID PANEL W/O CHOL/HDL RATIO
CHOLESTEROL TOTAL: 132 mg/dL (ref 100–199)
HDL: 43 mg/dL (ref >39)
LDL Calculated: 55 mg/dL (ref 0–99)
Triglycerides: 168 mg/dL — ABNORMAL HIGH (ref 0–149)
VLDL Cholesterol Cal: 34 mg/dL (ref 5–40)

## 2018-08-04 LAB — COMPREHENSIVE METABOLIC PANEL
A/G RATIO: 1.6 (ref 1.2–2.2)
ALBUMIN: 4.1 g/dL (ref 3.6–4.8)
ALK PHOS: 39 IU/L (ref 39–117)
ALT: 44 IU/L — ABNORMAL HIGH (ref 0–32)
AST: 41 IU/L — ABNORMAL HIGH (ref 0–40)
BILIRUBIN TOTAL: 0.4 mg/dL (ref 0.0–1.2)
BUN / CREAT RATIO: 14 (ref 12–28)
BUN: 13 mg/dL (ref 8–27)
CHLORIDE: 98 mmol/L (ref 96–106)
CO2: 20 mmol/L (ref 20–29)
CREATININE: 0.95 mg/dL (ref 0.57–1.00)
Calcium: 9.5 mg/dL (ref 8.7–10.3)
GFR calc Af Amer: 75 mL/min/{1.73_m2} (ref >59)
GFR calc non Af Amer: 65 mL/min/{1.73_m2} (ref >59)
GLOBULIN, TOTAL: 2.5 g/dL (ref 1.5–4.5)
Glucose: 217 mg/dL — ABNORMAL HIGH (ref 65–99)
POTASSIUM: 4.7 mmol/L (ref 3.5–5.2)
SODIUM: 139 mmol/L (ref 134–144)
Total Protein: 6.6 g/dL (ref 6.0–8.5)

## 2018-08-07 ENCOUNTER — Ambulatory Visit
Admission: RE | Admit: 2018-08-07 | Discharge: 2018-08-07 | Disposition: A | Discharge status: Home / Self Care | Payer: No Typology Code available for payment source | Source: Ambulatory Visit | Attending: Physical Medicine and Rehabilitation | Admitting: Physical Medicine and Rehabilitation

## 2018-08-07 DIAGNOSIS — M5416 Radiculopathy, lumbar region: Secondary | ICD-10-CM

## 2018-10-06 DIAGNOSIS — E669 Obesity, unspecified: Secondary | ICD-10-CM | POA: Insufficient documentation

## 2018-10-06 DIAGNOSIS — E1169 Type 2 diabetes mellitus with other specified complication: Secondary | ICD-10-CM | POA: Insufficient documentation

## 2018-11-14 ENCOUNTER — Ambulatory Visit (INDEPENDENT_AMBULATORY_CARE_PROVIDER_SITE_OTHER): Payer: No Typology Code available for payment source | Admitting: Nurse Practitioner

## 2018-11-14 ENCOUNTER — Encounter: Payer: Self-pay | Admitting: Nurse Practitioner

## 2018-11-14 VITALS — BP 146/78 | HR 90 | Temp 98.3°F | Ht 61.0 in | Wt 234.4 lb

## 2018-11-14 DIAGNOSIS — H6692 Otitis media, unspecified, left ear: Secondary | ICD-10-CM | POA: Insufficient documentation

## 2018-11-14 DIAGNOSIS — H65192 Other acute nonsuppurative otitis media, left ear: Secondary | ICD-10-CM | POA: Diagnosis not present

## 2018-11-14 DIAGNOSIS — H6690 Otitis media, unspecified, unspecified ear: Secondary | ICD-10-CM | POA: Insufficient documentation

## 2018-11-14 DIAGNOSIS — M549 Dorsalgia, unspecified: Secondary | ICD-10-CM

## 2018-11-14 LAB — MICROSCOPIC EXAMINATION: BACTERIA UA: NONE SEEN

## 2018-11-14 LAB — UA/M W/RFLX CULTURE, ROUTINE
Bilirubin, UA: NEGATIVE
Glucose, UA: NEGATIVE
Leukocytes, UA: NEGATIVE
NITRITE UA: NEGATIVE
PH UA: 5.5 (ref 5.0–7.5)
Specific Gravity, UA: 1.02 (ref 1.005–1.030)
Urobilinogen, Ur: 0.2 mg/dL (ref 0.2–1.0)

## 2018-11-14 MED ORDER — CEFPODOXIME PROXETIL 200 MG PO TABS: 200.0000 mg | ORAL_TABLET | Freq: Two times a day (BID) | ORAL | 0 refills | Status: AC

## 2018-11-14 NOTE — Patient Instructions (Signed)
Cefpodoxime tablets What is this medicine? CEFPODOXIME (sef pode OX eem) is a cephalosporin antibiotic. It is used to treat certain kinds of bacterial infections. It will not work for colds, flu, or other viral infections. This medicine may be used for other purposes; ask your health care provider or pharmacist if you have questions. COMMON BRAND NAME(S): Vantin What should I tell my health care provider before I take this medicine? They need to know if you have any of these conditions: -bleeding problems -bowel disease, like colitis -kidney disease -other chronic illness -an unusual or allergic reaction to cefpodoxime, other cephalosporin antibiotics, penicillin, penicillamine, other foods, dyes or preservatives -pregnant or trying to get pregnant -breast-feeding How should I use this medicine? Take this medicine by mouth with a full glass of water. Follow the directions on the prescription label. Take with food. Take your medicine at regular intervals. Do not take your medicine more often than directed. Take all of your medicine as directed even if you think you are better. Do not skip doses or stop your medicine early. Talk to your pediatrician regarding the use of this medicine in children. Special care may be needed. Overdosage: If you think you have taken too much of this medicine contact a poison control center or emergency room at once. NOTE: This medicine is only for you. Do not share this medicine with others. What if I miss a dose? If you miss a dose, take it as soon as you can. If it is almost time for your next dose, take only that dose. Do not take double or extra doses. What may interact with this medicine? -antacids -diuretics -medicines for stomach acid or ulcer like cimetidine, famotidine, lansoprazole, nizatidine, omeprazole, ranitidine -probenecid -sodium bicarbonate This list may not describe all possible interactions. Give your health care provider a list of all the  medicines, herbs, non-prescription drugs, or dietary supplements you use. Also tell them if you smoke, drink alcohol, or use illegal drugs. Some items may interact with your medicine. What should I watch for while using this medicine? Tell your doctor or health care professional if your symptoms do not improve or if you get new symptoms. Do not treat diarrhea with over the counter products. Contact your doctor if you have diarrhea that lasts more than 2 days or if it is severe and watery. What side effects may I notice from receiving this medicine? Side effects that you should report to your doctor or health care professional as soon as possible: -allergic reactions like skin rash, itching or hives, swelling of the face, lips, or tongue -breathing problems -confused, nervous, shaky -fast, irregular heartbeat -redness, blistering, peeling or loosening of the skin, including inside the mouth -unusually weak or tired -vaginal irritation, discharge Side effects that usually do not require medical attention (report to your doctor or health care professional if they continue or are bothersome): -diarrhea -dizzy, drowsy -dry mouth -headache -joint or muscle pain -stomach upset, gas -trouble sleeping -vomiting This list may not describe all possible side effects. Call your doctor for medical advice about side effects. You may report side effects to FDA at 1-800-FDA-1088. Where should I keep my medicine? Keep out of the reach of children. Store at room temperature between 20 and 25 degrees C (68 and 77 degrees F). Keep container closed tightly. Throw away any unused medicine after the expiration date. NOTE: This sheet is a summary. It may not cover all possible information. If you have questions about this medicine, talk to  your doctor, pharmacist, or health care provider.  2018 [employer]vier/Gold Standard (2008-02-20 13:24:38)

## 2018-11-14 NOTE — Progress Notes (Signed)
BP (!) 146/78   Pulse 90   Temp 98.3 F (36.8 C)   Ht 5\' 1"  (1.549 m)   Wt 234 lb 6 oz (106.3 kg)   LMP 03/17/2013 (Approximate)   SpO2 95%   BMI 44.28 kg/m    Subjective:    Patient ID: Sierra Copeland, female    DOB: 01-02-1956, 62 y.o.   MRN: [id number]  HPI: Sierra Copeland is a 62 y.o. female presents for acute visit for ear pain and UTI  Chief Complaint  Patient presents with  . Urinary Tract Infection    lower abdomen and back  . Ear Pain    left some pain in right, some nasal drainage   URINARY SYMPTOMS States she is having lower pain in the front and lower back pain.  This started "last week sometime". Dysuria: no Urinary frequency: no Urgency: yes Small volume voids: yes Symptom severity: yes Urinary incontinence: no Foul odor: yes Hematuria: no Abdominal pain: yes Back pain: yes Suprapubic pain/pressure: yes Flank pain: no Fever:  low grade Vomiting: no Relief with cranberry juice: no Relief with pyridium: no Status: stable Previous urinary tract infection: yes Recurrent urinary tract infection: no Sexual activity: No sexually active/monogomous/practicing safe sex History of sexually transmitted disease: no Penile discharge: no Treatments attempted: none   EAR PAIN Was treated last for left otitis media in July 2019 with Doxycycline, which she states improved infection. Duration: weeks Involved ear(s): left Severity:  6/10  Quality:  aching and throbbing Fever: yes Otorrhea: no Upper respiratory infection symptoms: has started coughing "a little bit" Pruritus: no Hearing loss: no Water immersion no Using Q-tips: no Recurrent otitis media: no Status: worse Treatments attempted: none   Relevant past medical, surgical, family and social history reviewed and updated as indicated. Interim medical history since our last visit reviewed. Allergies and medications reviewed and updated.  Review of Systems  Constitutional: Negative for activity  change, appetite change and fatigue.  HENT: Positive for ear pain, postnasal drip and rhinorrhea. Negative for congestion, ear discharge, facial swelling, sinus pressure, sinus pain, sneezing, sore throat and voice change.   Eyes: Negative for pain and visual disturbance.  Respiratory: Negative for cough, chest tightness, shortness of breath and wheezing.   Cardiovascular: Negative for chest pain, palpitations and leg swelling.  Gastrointestinal: Negative for abdominal distention, abdominal pain, constipation, diarrhea, nausea and vomiting.  Musculoskeletal: Negative for myalgias.  Neurological: Negative for dizziness, syncope, weakness, light-headedness, numbness and headaches.  Psychiatric/Behavioral: Negative.     Per HPI unless specifically indicated above     Objective:    BP (!) 146/78   Pulse 90   Temp 98.3 F (36.8 C)   Ht 5\' 1"  (1.549 m)   Wt 234 lb 6 oz (106.3 kg)   LMP 03/17/2013 (Approximate)   SpO2 95%   BMI 44.28 kg/m   Wt Readings from Last 3 Encounters:  11/14/18 234 lb 6 oz (106.3 kg)  08/03/18 242 lb 8 oz (110 kg)  06/09/18 242 lb (109.8 kg)    Physical Exam Vitals signs and nursing note reviewed.  Constitutional:      Appearance: Normal appearance. She is well-developed. She is not ill-appearing.  HENT:     Head: Normocephalic. No raccoon eyes.     Right Ear: Hearing, ear canal and external ear normal. No drainage or tenderness. A middle ear effusion is present.     Left Ear: Hearing, ear canal and external ear normal. No drainage or tenderness.  Tympanic membrane is injected and bulging.     Ears:     Comments: Able to visualize bony landmarks right ear, cloudy left TM with poor visualization of bony landmarks.    Mouth/Throat:     Mouth: Mucous membranes are moist.     Pharynx: Posterior oropharyngeal erythema (mild ) present. No pharyngeal swelling or oropharyngeal exudate.  Eyes:     General:        Right eye: No discharge.        Left eye: No  discharge.     Conjunctiva/sclera: Conjunctivae normal.     Pupils: Pupils are equal, round, and reactive to light.  Neck:     Musculoskeletal: Normal range of motion and neck supple.     Thyroid: No thyromegaly.     Vascular: No carotid bruit or JVD.  Cardiovascular:     Rate and Rhythm: Normal rate and regular rhythm.     Heart sounds: Normal heart sounds.  Pulmonary:     Effort: Pulmonary effort is normal.     Breath sounds: Normal breath sounds.  Abdominal:     General: Bowel sounds are normal.     Palpations: Abdomen is soft.     Comments: No CVA or suprapubic tenderness.  Lymphadenopathy:     Head:     Right side of head: No submental, submandibular, preauricular or posterior auricular adenopathy.     Left side of head: Preauricular adenopathy present. No submental, submandibular or posterior auricular adenopathy.     Cervical: No cervical adenopathy.  Skin:    General: Skin is warm and dry.  Neurological:     Mental Status: She is alert and oriented to person, place, and time.  Psychiatric:        Attention and Perception: Attention normal.        Mood and Affect: Mood normal.        Behavior: Behavior normal.        Thought Content: Thought content normal.        Judgment: Judgment normal.   CRCL 104.20 based on September labs  Results for orders placed or performed in visit on 08/03/18  Comprehensive metabolic panel  Result Value Ref Range   Glucose 217 (H) 65 - 99 mg/dL   BUN 13 8 - 27 mg/dL   Creatinine, Ser 0.95 0.57 - 1.00 mg/dL   GFR calc non Af Amer 65 >59 mL/min/1.73   GFR calc Af Amer 75 >59 mL/min/1.73   BUN/Creatinine Ratio 14 12 - 28   Sodium 139 134 - 144 mmol/L   Potassium 4.7 3.5 - 5.2 mmol/L   Chloride 98 96 - 106 mmol/L   CO2 20 20 - 29 mmol/L   Calcium 9.5 8.7 - 10.3 mg/dL   Total Protein 6.6 6.0 - 8.5 g/dL   Albumin 4.1 3.6 - 4.8 g/dL   Globulin, Total 2.5 1.5 - 4.5 g/dL   Albumin/Globulin Ratio 1.6 1.2 - 2.2   Bilirubin Total 0.4 0.0 -  1.2 mg/dL   Alkaline Phosphatase 39 39 - 117 IU/L   AST 41 (H) 0 - 40 IU/L   ALT 44 (H) 0 - 32 IU/L  Lipid Panel w/o Chol/HDL Ratio  Result Value Ref Range   Cholesterol, Total 132 100 - 199 mg/dL   Triglycerides 168 (H) 0 - 149 mg/dL   HDL 43 >39 mg/dL   VLDL Cholesterol Cal 34 5 - 40 mg/dL   LDL Calculated 55 0 - 99 mg/dL  Assessment & Plan:   Problem List Items Addressed This Visit      Nervous and Auditory   Otitis media, left - Primary    Script for Vantin 200 MG PO BID x 10 days sent (coverage for urine and ear).  Encouraged increased oral fluid intake.  Claritin 10MG  daily for nasal drainage.  Return for worsening or continued symptoms.      Relevant Medications   cefpodoxime (VANTIN) 200 MG tablet     Other   Back pain    Urinalysis noting 1+ Bili, 1+ Ketone, 1+ Protein, and trace blood.  Negative Nitr.  Encouraged increase fluid intake and Ascorbic Acid 500 MG daily orally to acidify urine.  Will initiate Vantin 200 MG BID x 10 days (coverage for ear and UTI if present on culture.  Tylenol for discomfort.  Return for worsening or continued symptoms.      Relevant Orders   UA/M w/rflx Culture, Routine       Follow up plan: Return if symptoms worsen or fail to improve.

## 2018-11-14 NOTE — Assessment & Plan Note (Signed)
Script for Vantin 200 MG PO BID x 10 days sent (coverage for urine and ear).  Encouraged increased oral fluid intake.  Claritin 10MG  daily for nasal drainage.  Return for worsening or continued symptoms.

## 2018-11-14 NOTE — Assessment & Plan Note (Signed)
Urinalysis noting 1+ Bili, 1+ Ketone, 1+ Protein, and trace blood.  Negative Nitr.  Encouraged increase fluid intake and Ascorbic Acid 500 MG daily orally to acidify urine.  Will initiate Vantin 200 MG BID x 10 days (coverage for ear and UTI if present on culture.  Tylenol for discomfort.  Return for worsening or continued symptoms.

## 2019-01-04 ENCOUNTER — Other Ambulatory Visit: Payer: Self-pay

## 2019-01-04 ENCOUNTER — Encounter: Payer: Self-pay | Admitting: Family Medicine

## 2019-01-04 ENCOUNTER — Ambulatory Visit: Payer: Self-pay | Admitting: Family Medicine

## 2019-01-04 VITALS — BP 167/85 | HR 81 | Temp 98.8°F | Ht 61.0 in | Wt 230.6 lb

## 2019-01-04 DIAGNOSIS — M792 Neuralgia and neuritis, unspecified: Secondary | ICD-10-CM

## 2019-01-04 MED ORDER — VALACYCLOVIR HCL 1 G PO TABS: 1000.0000 mg | ORAL_TABLET | Freq: Two times a day (BID) | ORAL | 0 refills | Status: DC

## 2019-01-04 MED ORDER — NORTRIPTYLINE HCL 25 MG PO CAPS: 25.0000 mg | ORAL_CAPSULE | Freq: Every day | ORAL | 1 refills | Status: DC

## 2019-01-04 NOTE — Progress Notes (Signed)
BP (!) 167/85 (BP Location: Right Arm, Patient Position: Sitting, Cuff Size: Normal)   Pulse 81   Temp 98.8 F (37.1 C) (Oral)   Ht 5\' 1"  (1.549 m)   Wt 230 lb 9 oz (104.6 kg)   LMP 03/17/2013 (Approximate)   SpO2 95%   BMI 43.56 kg/m    Subjective:    Patient ID: Sierra Copeland, female    DOB: May 17, 1956, 63 y.o.   MRN: [id number]  HPI: Sierra Copeland is a 63 y.o. female  Chief Complaint  Patient presents with  . Herpes Zoster    two weeks got worse since monday on left side itchyness   RASH Duration:  2 weeks  Location: L side  Itching: yes Burning: yes Redness: yes Oozing: no Scaling: yes Blisters: yes Painful: yes Fevers: no Change in detergents/soaps/personal care products: no Recent illness: no Recent travel:no History of same: no Context: worse Alleviating factors: nothing Treatments attempted:nothing Shortness of breath: no  Throat/tongue swelling: no Myalgias/arthralgias: no  Relevant past medical, surgical, family and social history reviewed and updated as indicated. Interim medical history since our last visit reviewed. Allergies and medications reviewed and updated.  Review of Systems  Respiratory: Negative.   Cardiovascular: Negative.   Musculoskeletal: Negative.   Skin: Positive for rash. Negative for color change, pallor and wound.  Neurological: Negative.   Psychiatric/Behavioral: Negative.     Per HPI unless specifically indicated above     Objective:    BP (!) 167/85 (BP Location: Right Arm, Patient Position: Sitting, Cuff Size: Normal)   Pulse 81   Temp 98.8 F (37.1 C) (Oral)   Ht 5\' 1"  (1.549 m)   Wt 230 lb 9 oz (104.6 kg)   LMP 03/17/2013 (Approximate)   SpO2 95%   BMI 43.56 kg/m   Wt Readings from Last 3 Encounters:  01/04/19 230 lb 9 oz (104.6 kg)  11/14/18 234 lb 6 oz (106.3 kg)  08/03/18 242 lb 8 oz (110 kg)    Physical Exam Vitals signs and nursing note reviewed.  Constitutional:      General: She is not in  acute distress.    Appearance: Normal appearance. She is not ill-appearing, toxic-appearing or diaphoretic.  HENT:     Head: Normocephalic and atraumatic.     Right Ear: External ear normal.     Left Ear: External ear normal.     Nose: Nose normal.     Mouth/Throat:     Mouth: Mucous membranes are moist.     Pharynx: Oropharynx is clear.  Eyes:     General: No scleral icterus.       Right eye: No discharge.        Left eye: No discharge.     Extraocular Movements: Extraocular movements intact.     Conjunctiva/sclera: Conjunctivae normal.     Pupils: Pupils are equal, round, and reactive to light.  Neck:     Musculoskeletal: Normal range of motion and neck supple.  Cardiovascular:     Rate and Rhythm: Normal rate and regular rhythm.     Pulses: Normal pulses.     Heart sounds: Normal heart sounds. No murmur. No friction rub. No gallop.   Pulmonary:     Effort: Pulmonary effort is normal. No respiratory distress.     Breath sounds: Normal breath sounds. No stridor. No wheezing, rhonchi or rales.  Chest:     Chest wall: No tenderness.  Musculoskeletal: Normal range of motion.  Skin:  General: Skin is warm and dry.     Capillary Refill: Capillary refill takes less than 2 seconds.     Coloration: Skin is not jaundiced or pale.     Findings: No bruising, erythema, lesion or rash.     Comments: No rash today, points along L T9 dermatome  Neurological:     General: No focal deficit present.     Mental Status: She is alert and oriented to person, place, and time. Mental status is at baseline.  Psychiatric:        Mood and Affect: Mood normal.        Behavior: Behavior normal.        Thought Content: Thought content normal.        Judgment: Judgment normal.     Results for orders placed or performed in visit on 11/14/18  Microscopic Examination  Result Value Ref Range   WBC, UA 0-5 0 - 5 /hpf   RBC, UA 0-2 0 - 2 /hpf   Epithelial Cells (non renal) 0-10 0 - 10 /hpf   Casts  Present (A) None seen /lpf   Cast Type Hyaline casts N/A   Bacteria, UA None seen None seen/Few  UA/M w/rflx Culture, Routine  Result Value Ref Range   Specific Gravity, UA 1.020 1.005 - 1.030   pH, UA 5.5 5.0 - 7.5   Color, UA Yellow Yellow   Appearance Ur Clear Clear   Leukocytes, UA Negative Negative   Protein, UA 1+ (A) Negative/Trace   Glucose, UA Negative Negative   Ketones, UA 1+ (A) Negative   RBC, UA Trace (A) Negative   Bilirubin, UA Negative Negative   Urobilinogen, Ur 0.2 0.2 - 1.0 mg/dL   Nitrite, UA Negative Negative   Microscopic Examination See below:       Assessment & Plan:   Problem List Items Addressed This Visit    None    Visit Diagnoses    Neuralgia    -  Primary   Unclear if pre or post-herpetic. We will treat with nortriptyline and valtrex and recheck 10 days to 2 weeks. Call if getting worse or not getting better.       Follow up plan: Return in about 2 weeks (around 01/18/2019).

## 2019-01-11 LAB — HM MAMMOGRAPHY

## 2019-01-12 LAB — HEPATIC FUNCTION PANEL
ALT: 24 (ref 7–35)
AST: 20 (ref 13–35)
Alkaline Phosphatase: 33 (ref 25–125)
Bilirubin, Total: 0.4

## 2019-01-12 LAB — BASIC METABOLIC PANEL
BUN: 21 (ref 4–21)
Creatinine: 0.9 (ref 0.5–1.1)
Glucose: 212
Potassium: 4.8 (ref 3.4–5.3)
Sodium: 138 (ref 137–147)

## 2019-01-12 LAB — LIPID PANEL
Cholesterol: 137 (ref 0–200)
HDL: 36 (ref 35–70)
LDL Cholesterol: 62
Triglycerides: 195 — AB (ref 40–160)

## 2019-01-12 LAB — HEMOGLOBIN A1C: HEMOGLOBIN A1C: 10.2

## 2019-01-22 ENCOUNTER — Ambulatory Visit (INDEPENDENT_AMBULATORY_CARE_PROVIDER_SITE_OTHER): Payer: Self-pay | Admitting: Family Medicine

## 2019-01-22 ENCOUNTER — Encounter: Payer: Self-pay | Admitting: Family Medicine

## 2019-01-22 VITALS — BP 135/76 | HR 87 | Temp 98.3°F | Wt 233.6 lb

## 2019-01-22 DIAGNOSIS — M792 Neuralgia and neuritis, unspecified: Secondary | ICD-10-CM

## 2019-01-22 NOTE — Progress Notes (Signed)
BP 135/76   Pulse 87   Temp 98.3 F (36.8 C) (Oral)   Wt 233 lb 9.6 oz (106 kg)   LMP 03/17/2013 (Approximate)   SpO2 97%   BMI 44.14 kg/m    Subjective:    Patient ID: Sierra Copeland, female    DOB: August 09, 1956, 63 y.o.   MRN: [id number]  HPI: Sierra Copeland is a 63 y.o. female  Chief Complaint  Patient presents with  . Pain    neuralgia- 2 week f/up   Feeling much better. No pain. Doing well. No side effects from the amitriptyline. No other concerns.   Relevant past medical, surgical, family and social history reviewed and updated as indicated. Interim medical history since our last visit reviewed. Allergies and medications reviewed and updated.  Review of Systems  Constitutional: Negative.   Respiratory: Negative.   Cardiovascular: Negative.   Musculoskeletal: Negative.   Psychiatric/Behavioral: Negative.     Per HPI unless specifically indicated above     Objective:    BP 135/76   Pulse 87   Temp 98.3 F (36.8 C) (Oral)   Wt 233 lb 9.6 oz (106 kg)   LMP 03/17/2013 (Approximate)   SpO2 97%   BMI 44.14 kg/m   Wt Readings from Last 3 Encounters:  01/22/19 233 lb 9.6 oz (106 kg)  01/04/19 230 lb 9 oz (104.6 kg)  11/14/18 234 lb 6 oz (106.3 kg)    Physical Exam Vitals signs and nursing note reviewed.  Constitutional:      General: She is not in acute distress.    Appearance: Normal appearance. She is not ill-appearing, toxic-appearing or diaphoretic.  HENT:     Head: Normocephalic and atraumatic.     Right Ear: External ear normal.     Left Ear: External ear normal.     Nose: Nose normal.     Mouth/Throat:     Mouth: Mucous membranes are moist.     Pharynx: Oropharynx is clear.  Eyes:     General: No scleral icterus.       Right eye: No discharge.        Left eye: No discharge.     Extraocular Movements: Extraocular movements intact.     Conjunctiva/sclera: Conjunctivae normal.     Pupils: Pupils are equal, round, and reactive to light.  Neck:     Musculoskeletal: Normal range of motion and neck supple.  Cardiovascular:     Rate and Rhythm: Normal rate and regular rhythm.     Pulses: Normal pulses.     Heart sounds: Normal heart sounds. No murmur. No friction rub. No gallop.   Pulmonary:     Effort: Pulmonary effort is normal. No respiratory distress.     Breath sounds: Normal breath sounds. No stridor. No wheezing, rhonchi or rales.  Chest:     Chest wall: No tenderness.  Musculoskeletal: Normal range of motion.  Skin:    General: Skin is warm and dry.     Capillary Refill: Capillary refill takes less than 2 seconds.     Coloration: Skin is not jaundiced or pale.     Findings: No bruising, erythema, lesion or rash.  Neurological:     General: No focal deficit present.     Mental Status: She is alert and oriented to person, place, and time. Mental status is at baseline.  Psychiatric:        Mood and Affect: Mood normal.        Behavior: Behavior  normal.        Thought Content: Thought content normal.        Judgment: Judgment normal.     Results for orders placed or performed in visit on 11/14/18  Microscopic Examination  Result Value Ref Range   WBC, UA 0-5 0 - 5 /hpf   RBC, UA 0-2 0 - 2 /hpf   Epithelial Cells (non renal) 0-10 0 - 10 /hpf   Casts Present (A) None seen /lpf   Cast Type Hyaline casts N/A   Bacteria, UA None seen None seen/Few  UA/M w/rflx Culture, Routine  Result Value Ref Range   Specific Gravity, UA 1.020 1.005 - 1.030   pH, UA 5.5 5.0 - 7.5   Color, UA Yellow Yellow   Appearance Ur Clear Clear   Leukocytes, UA Negative Negative   Protein, UA 1+ (A) Negative/Trace   Glucose, UA Negative Negative   Ketones, UA 1+ (A) Negative   RBC, UA Trace (A) Negative   Bilirubin, UA Negative Negative   Urobilinogen, Ur 0.2 0.2 - 1.0 mg/dL   Nitrite, UA Negative Negative   Microscopic Examination See below:       Assessment & Plan:   Problem List Items Addressed This Visit    None    Visit  Diagnoses    Neuralgia    -  Primary   Resolved. Will continue her amitripyline for about a month total and then stop. If pain returns, she will call.        Follow up plan: Return As scheduled. Marland Kitchen

## 2019-02-02 ENCOUNTER — Ambulatory Visit (INDEPENDENT_AMBULATORY_CARE_PROVIDER_SITE_OTHER): Payer: Self-pay | Admitting: Family Medicine

## 2019-02-02 ENCOUNTER — Other Ambulatory Visit (HOSPITAL_COMMUNITY)
Admission: RE | Admit: 2019-02-02 | Discharge: 2019-02-02 | Disposition: A | Discharge status: Home / Self Care | Payer: Self-pay | Source: Ambulatory Visit | Attending: Family Medicine | Admitting: Family Medicine

## 2019-02-02 ENCOUNTER — Encounter: Payer: Self-pay | Admitting: Family Medicine

## 2019-02-02 ENCOUNTER — Other Ambulatory Visit: Payer: Self-pay

## 2019-02-02 VITALS — BP 126/83 | HR 93 | Temp 98.6°F | Ht 62.0 in | Wt 229.0 lb

## 2019-02-02 DIAGNOSIS — E1165 Type 2 diabetes mellitus with hyperglycemia: Secondary | ICD-10-CM

## 2019-02-02 DIAGNOSIS — Z0000 Encounter for general adult medical examination without abnormal findings: Secondary | ICD-10-CM

## 2019-02-02 DIAGNOSIS — C50912 Malignant neoplasm of unspecified site of left female breast: Secondary | ICD-10-CM

## 2019-02-02 DIAGNOSIS — F3342 Major depressive disorder, recurrent, in full remission: Secondary | ICD-10-CM

## 2019-02-02 DIAGNOSIS — Z124 Encounter for screening for malignant neoplasm of cervix: Secondary | ICD-10-CM | POA: Insufficient documentation

## 2019-02-02 DIAGNOSIS — M059 Rheumatoid arthritis with rheumatoid factor, unspecified: Secondary | ICD-10-CM

## 2019-02-02 DIAGNOSIS — I129 Hypertensive chronic kidney disease with stage 1 through stage 4 chronic kidney disease, or unspecified chronic kidney disease: Secondary | ICD-10-CM

## 2019-02-02 DIAGNOSIS — E7800 Pure hypercholesterolemia, unspecified: Secondary | ICD-10-CM

## 2019-02-02 DIAGNOSIS — R1013 Epigastric pain: Secondary | ICD-10-CM

## 2019-02-02 MED ORDER — L-METHYLFOLATE-B6-B12 3-35-2 MG PO TABS: 1.0000 | ORAL_TABLET | Freq: Every day | ORAL | 3 refills | Status: DC

## 2019-02-02 MED ORDER — NORTRIPTYLINE HCL 25 MG PO CAPS: 25.0000 mg | ORAL_CAPSULE | Freq: Every day | ORAL | 1 refills | Status: DC

## 2019-02-02 MED ORDER — SIMVASTATIN 40 MG PO TABS: 40.0000 mg | ORAL_TABLET | Freq: Every day | ORAL | 1 refills | Status: DC

## 2019-02-02 MED ORDER — LISINOPRIL 20 MG PO TABS: 20.0000 mg | ORAL_TABLET | Freq: Every day | ORAL | 1 refills | Status: DC

## 2019-02-02 MED ORDER — PAROXETINE HCL 20 MG PO TABS: 20.0000 mg | ORAL_TABLET | Freq: Every day | ORAL | 1 refills | Status: DC

## 2019-02-02 NOTE — Assessment & Plan Note (Signed)
Doing well. Follows with breast surgeon at [facility]. Call with any concerns.

## 2019-02-02 NOTE — Assessment & Plan Note (Signed)
Congratulated patient on slow, steady weight loss. Call with any concerns. Continue to work on diet and exercise.

## 2019-02-02 NOTE — Assessment & Plan Note (Signed)
Not doing great. Follows with endocrinology. Call with any concerns. Continue to monitor.

## 2019-02-02 NOTE — Assessment & Plan Note (Signed)
Under good control on current regimen. Continue current regimen. Continue to monitor. Call with any concerns. Refills given. Labs checked today.  

## 2019-02-02 NOTE — Assessment & Plan Note (Signed)
Doing well. Continue to follow with rheumatology. Call with any concerns.

## 2019-02-02 NOTE — Progress Notes (Signed)
BP 126/83   Pulse 93   Temp 98.6 F (37 C) (Oral)   Ht 5\' 2"  (1.575 m)   Wt 229 lb (103.9 kg)   LMP 03/17/2013 (Approximate)   SpO2 95%   BMI 41.88 kg/m    Subjective:    Patient ID: Sierra Copeland, female    DOB: 07/18/1956, 63 y.o.   MRN: [id number]  HPI: Sierra Copeland is a 63 y.o. female presenting on 02/02/2019 for comprehensive medical examination. Current medical complaints include:  HYPERTENSION / HYPERLIPIDEMIA Satisfied with current treatment? yes Duration of hypertension: chronic BP monitoring frequency: not checking BP medication side effects: no Past BP meds: lisinopril Duration of hyperlipidemia: chronic Cholesterol medication side effects: no Cholesterol supplements: none Past cholesterol medications: simvastatin Medication compliance: excellent compliance Aspirin: yes Recent stressors: no Recurrent headaches: no Visual changes: no Palpitations: no Dyspnea: no Chest pain: no Lower extremity edema: no Dizzy/lightheaded: no  DEPRESSION Mood status: controlled Satisfied with current treatment?: yes Symptom severity: mild  Duration of current treatment : chronic Side effects: no Medication compliance: excellent compliance Psychotherapy/counseling: no  Previous psychiatric medications: paxil Depressed mood: no Anxious mood: no Anhedonia: no Significant weight loss or gain: no Insomnia: no  Fatigue: no Feelings of worthlessness or guilt: no Impaired concentration/indecisiveness: no Suicidal ideations: no Hopelessness: no Crying spells: no Depression screen [facility] 2/9 02/02/2019 08/03/2018 06/09/2018 01/31/2018 08/03/2017  Decreased Interest 0 0 0 0 0  Down, Depressed, Hopeless 0 0 0 0 0  PHQ - 2 Score 0 0 0 0 0  Altered sleeping 0 0 - 1 1  Tired, decreased energy 1 1 - 2 1  Change in appetite 1 0 - 0 0  Feeling bad or failure about yourself  0 0 - 0 0  Trouble concentrating 0 0 - 0 0  Moving slowly or fidgety/restless 0 0 - 0 0  Suicidal thoughts 0 0 - 0  0  PHQ-9 Score 2 1 - 3 2  Difficult doing work/chores Not difficult at all Not difficult at all - - -   GAD 7 : Generalized Anxiety Score 02/02/2019 01/31/2018 07/14/2016  Nervous, Anxious, on Edge 0 1 0  Control/stop worrying 0 0 0  Worry too much - different things 0 0 0  Trouble relaxing 0 0 0  Restless 0 0 1  Easily annoyed or irritable 0 1 1  Afraid - awful might happen 0 0 0  Total GAD 7 Score 0 2 2  Anxiety Difficulty Not difficult at all - Not difficult at all    Continues to follow with rheumatology for her RA- doing OK. Follows with endocrinology for her diabetes. Following with radiation oncology for her breast cancer at [facility]. Last mammogram normal.   Menopausal Symptoms: no  Depression Screen done today and results listed below:  Depression screen [facility] 2/9 02/02/2019 08/03/2018 06/09/2018 01/31/2018 08/03/2017  Decreased Interest 0 0 0 0 0  Down, Depressed, Hopeless 0 0 0 0 0  PHQ - 2 Score 0 0 0 0 0  Altered sleeping 0 0 - 1 1  Tired, decreased energy 1 1 - 2 1  Change in appetite 1 0 - 0 0  Feeling bad or failure about yourself  0 0 - 0 0  Trouble concentrating 0 0 - 0 0  Moving slowly or fidgety/restless 0 0 - 0 0  Suicidal thoughts 0 0 - 0 0  PHQ-9 Score 2 1 - 3 2  Difficult doing work/chores Not difficult at  all Not difficult at all - - -   Past Medical History:  Past Medical History:  Diagnosis Date  . Abnormal mammogram   . Arthritis   . Back pain    spinal stenosis and neck bone spur  . Cancer (HCC)    Breast  . Car sickness   . Collagenous colitis   . Depression   . Diabetes mellitus without complication ([place])   . GERD (gastroesophageal reflux disease)    occ-no meds  . Hair loss    TAKING THYROTAIN  . Headache    migraines  . Hyperlipemia   . Hypertension   . Osteoarthritis    seen by Rheum, not enoug evidence to support RA  . Pinched nerve   . PONV (postoperative nausea and vomiting)    after tonsillectomy  . Rosacea   . Seborrheic dermatitis   .  Sleep apnea   . Wears glasses     Surgical History:  Past Surgical History:  Procedure Laterality Date  . ACHILLES TENDON REPAIR  2007   right  . BREAST SURGERY  2011   lt br bx-non cancer  . CARPAL TUNNEL RELEASE     right and left  . CHOLECYSTECTOMY N/A 09/06/2017   Procedure: LAPAROSCOPY;  Surgeon: Leonie Green, MD;  Location: [facility];  Service: General;  Laterality: N/A;  . COLONOSCOPY    . epidural steroid injection x2  2017  . ESOPHAGOGASTRODUODENOSCOPY N/A 01/21/2016   Procedure: ESOPHAGOGASTRODUODENOSCOPY (EGD);  Surgeon: Hulen Luster, MD;  Location: [facility];  Service: Gastroenterology;  Laterality: N/A;  Please call at work [phone] Diabetic - insulin  . FINGER EXPLORATION  2013   left long   . MASTECTOMY Left    Partial  . NERVE EXPLORATION Left 10/03/2013   Procedure: NERVE EXPLORATION/ LEFT LONG RADIAL DISTAL NERVE neurolysis;  Surgeon: Schuyler Amor, MD;  Location: [facility];  Service: Orthopedics;  Laterality: Left;  . SHOULDER ARTHROSCOPY WITH DEBRIDEMENT AND BICEP TENDON REPAIR Right 05/13/2016   Procedure: Arthroscopic debridement, open decompression, rotator cuff repair, tenodesis,right shoulder.;  Surgeon: Corky Mull, MD;  Location: [facility];  Service: Orthopedics;  Laterality: Right;  . TONSILLECTOMY    . UVULOPALATOPHARYNGOPLASTY  1998   and tonsils-tongue pexi    Medications:  Current Outpatient Medications on File Prior to Visit  Medication Sig  . Cholecalciferol (VITAMIN D3) 2000 units capsule Take 2,000 Units by mouth at bedtime.   . Continuous Blood Gluc Receiver (FREESTYLE LIBRE 14 DAY READER) DEVI Use 1 Device as directed  . Continuous Blood Gluc Sensor (FREESTYLE LIBRE 14 DAY SENSOR) MISC Use 1 Device as directed  . fexofenadine (ALLEGRA) 180 MG tablet Take 180 mg by mouth daily with breakfast.   . fluticasone (FLONASE) 50 MCG/ACT nasal spray Place 1-2 sprays into both nostrils every evening.   . folic acid  (FOLVITE) 1 MG tablet   . gabapentin (NEURONTIN) 100 MG capsule Take 100 mg by mouth 2 (two) times daily.   Marland Kitchen glucose blood test strip 1 each by Other route as needed for other. One Touch Ultra. Test FSBS 3x a day. Dx:E11.65, LON 99 months: disp 300 with 1 refill.  Marland Kitchen ibuprofen (ADVIL,MOTRIN) 200 MG tablet Take 800 mg by mouth every 8 (eight) hours as needed (for pain.).   Marland Kitchen insulin aspart (NOVOLOG FLEXPEN) 100 UNIT/ML FlexPen Route: Inject 100 Units subcutaneously once daily In divided doses as directed  . Insulin Glargine (TOUJEO SOLOSTAR) 300 UNIT/ML SOPN Inject 100  Units into the skin at bedtime.   . Insulin Syringe-Needle U-100 30G X 5/16" 0.3 ML MISC   . metFORMIN (GLUCOPHAGE) 1000 MG tablet Take 1,000 mg by mouth 2 (two) times daily with a meal.   . methotrexate 50 MG/2ML injection Take 0.8 ml SQ once a week, 12 weeks  . pantoprazole (PROTONIX) 40 MG tablet Take 40 mg by mouth daily before breakfast.   . tiZANidine (ZANAFLEX) 2 MG tablet Take 2 mg by mouth at bedtime.   . traMADol (ULTRAM) 50 MG tablet Take 100 mg by mouth at bedtime.   . valACYclovir (VALTREX) 1000 MG tablet Take 1 tablet (1,000 mg total) by mouth 2 (two) times daily.   No current facility-administered medications on file prior to visit.     Allergies:  Allergies  Allergen Reactions  . Prednisone Palpitations    Increases BG and HR.    Social History:  Social History   Socioeconomic History  . Marital status: Married    Spouse name: Not on file  . Number of children: Not on file  . Years of education: Not on file  . Highest education level: Not on file  Occupational History  . Not on file  Social Needs  . Financial resource strain: Not on file  . Food insecurity:    Worry: Not on file    Inability: Not on file  . Transportation needs:    Medical: Not on file    Non-medical: Not on file  Tobacco Use  . Smoking status: Former Smoker    Packs/day: 1.00    Years: 10.00    Pack years: 10.00    Types:  Cigarettes    Last attempt to quit: 09/28/1986    Years since quitting: 32.3  . Smokeless tobacco: Never Used  Substance and Sexual Activity  . Alcohol use: No  . Drug use: No  . Sexual activity: Yes  Lifestyle  . Physical activity:    Days per week: Not on file    Minutes per session: Not on file  . Stress: Not on file  Relationships  . Social connections:    Talks on phone: Not on file    Gets together: Not on file    Attends religious service: Not on file    Active member of club or organization: Not on file    Attends meetings of clubs or organizations: Not on file    Relationship status: Not on file  . Intimate partner violence:    Fear of current or ex partner: Not on file    Emotionally abused: Not on file    Physically abused: Not on file    Forced sexual activity: Not on file  Other Topics Concern  . Not on file  Social History Narrative  . Not on file   Social History   Tobacco Use  Smoking Status Former Smoker  . Packs/day: 1.00  . Years: 10.00  . Pack years: 10.00  . Types: Cigarettes  . Last attempt to quit: 09/28/1986  . Years since quitting: 32.3  Smokeless Tobacco Never Used   Social History   Substance and Sexual Activity  Alcohol Use No    Family History:  Family History  Problem Relation Age of Onset  . Cancer Mother        liver  . Hypertension Mother   . Hyperlipidemia Mother   . Hyperlipidemia Father   . Hypertension Father   . Stroke Maternal Grandfather   . Diabetes Paternal Grandmother   .  Dementia Maternal Grandmother   . Heart disease Paternal Grandfather   . Diabetes Maternal Aunt   . Diabetes Maternal Uncle   . Heart disease Maternal Uncle   . Cancer Paternal Aunt        breast  . Diabetes Paternal Aunt   . Diabetes Paternal Uncle   . Cancer Paternal Uncle        prostate  . Cancer Paternal Aunt        breast    Past medical history, surgical history, medications, allergies, family history and social history  reviewed with patient today and changes made to appropriate areas of the chart.   Review of Systems  Constitutional: Negative.   HENT: Negative.   Eyes: Negative.   Respiratory: Negative.   Cardiovascular: Negative.   Gastrointestinal: Positive for diarrhea and heartburn. Negative for abdominal pain, blood in stool, constipation, melena, nausea and vomiting.  Genitourinary: Negative.   Musculoskeletal: Negative.   Skin: Negative.   Neurological: Negative.   Endo/Heme/Allergies: Positive for environmental allergies. Negative for polydipsia. Does not bruise/bleed easily.  Psychiatric/Behavioral: Negative.     All other ROS negative except what is listed above and in the HPI.      Objective:    BP 126/83   Pulse 93   Temp 98.6 F (37 C) (Oral)   Ht 5\' 2"  (1.575 m)   Wt 229 lb (103.9 kg)   LMP 03/17/2013 (Approximate)   SpO2 95%   BMI 41.88 kg/m   Wt Readings from Last 3 Encounters:  02/02/19 229 lb (103.9 kg)  01/22/19 233 lb 9.6 oz (106 kg)  01/04/19 230 lb 9 oz (104.6 kg)    Physical Exam Vitals signs and nursing note reviewed. Exam conducted with a chaperone present.  Constitutional:      General: She is not in acute distress.    Appearance: Normal appearance. She is not ill-appearing, toxic-appearing or diaphoretic.  HENT:     Head: Normocephalic and atraumatic.     Right Ear: Tympanic membrane, ear canal and external ear normal. There is no impacted cerumen.     Left Ear: Tympanic membrane, ear canal and external ear normal. There is no impacted cerumen.     Nose: Nose normal. No congestion or rhinorrhea.     Mouth/Throat:     Mouth: Mucous membranes are moist.     Pharynx: Oropharynx is clear. No oropharyngeal exudate or posterior oropharyngeal erythema.  Eyes:     General: No scleral icterus.       Right eye: No discharge.        Left eye: No discharge.     Extraocular Movements: Extraocular movements intact.     Conjunctiva/sclera: Conjunctivae normal.      Pupils: Pupils are equal, round, and reactive to light.  Neck:     Musculoskeletal: Normal range of motion and neck supple. No neck rigidity or muscular tenderness.     Vascular: No carotid bruit.  Cardiovascular:     Rate and Rhythm: Normal rate and regular rhythm.     Pulses: Normal pulses.     Heart sounds: No murmur. No friction rub. No gallop.   Pulmonary:     Effort: Pulmonary effort is normal. No respiratory distress.     Breath sounds: Normal breath sounds. No stridor. No wheezing, rhonchi or rales.  Chest:     Chest wall: No tenderness.  Abdominal:     General: Abdomen is flat. Bowel sounds are normal. There is no distension.  Palpations: Abdomen is soft. There is no mass.     Tenderness: There is no abdominal tenderness. There is no right CVA tenderness, left CVA tenderness, guarding or rebound.     Hernia: No hernia is present. There is no hernia in the right inguinal area or left inguinal area.  Genitourinary:    General: Normal vulva.     Labia:        Right: No rash, tenderness, lesion or injury.        Left: No rash, tenderness, lesion or injury.      Urethra: No prolapse, urethral pain, urethral swelling or urethral lesion.     Vagina: Normal. No signs of injury and foreign body. No vaginal discharge, erythema, tenderness, bleeding, lesions or prolapsed vaginal walls.     Cervix: Normal. No eversion.     Uterus: Normal.      Comments: Breast exams deferred with shared decision making- done at breast surgeon.  Musculoskeletal:        General: No swelling, tenderness, deformity or signs of injury.     Right lower leg: No edema.     Left lower leg: No edema.  Lymphadenopathy:     Cervical: No cervical adenopathy.     Lower Body: No right inguinal adenopathy. No left inguinal adenopathy.  Skin:    General: Skin is warm and dry.     Capillary Refill: Capillary refill takes less than 2 seconds.     Coloration: Skin is not jaundiced or pale.     Findings: No  bruising, erythema, lesion or rash.  Neurological:     General: No focal deficit present.     Mental Status: She is alert and oriented to person, place, and time. Mental status is at baseline.     Cranial Nerves: No cranial nerve deficit.     Sensory: No sensory deficit.     Motor: No weakness.     Coordination: Coordination normal.     Gait: Gait normal.     Deep Tendon Reflexes: Reflexes normal.  Psychiatric:        Mood and Affect: Mood normal.        Behavior: Behavior normal.        Thought Content: Thought content normal.        Judgment: Judgment normal.     Results for orders placed or performed in visit on 02/02/19  HM MAMMOGRAPHY  Result Value Ref Range   HM Mammogram 0-4 Bi-Rad 0-4 Bi-Rad, Self Reported Normal  Hemoglobin A1c  Result Value Ref Range   Hemoglobin A1C 73.5   Basic metabolic panel  Result Value Ref Range   Glucose 212    BUN 21 4 - 21   Creatinine 0.9 0.5 - 1.1   Potassium 4.8 3.4 - 5.3   Sodium 138 137 - 147  Lipid panel  Result Value Ref Range   Triglycerides 195 (A) 40 - 160   Cholesterol 137 0 - 200   HDL 36 35 - 70   LDL Cholesterol 62   Hepatic function panel  Result Value Ref Range   Alkaline Phosphatase 33 25 - 125   ALT 24 7 - 35   AST 20 13 - 35   Bilirubin, Total 0.4       Assessment & Plan:   Problem List Items Addressed This Visit      Endocrine   Type II diabetes mellitus, uncontrolled ([place])    Not doing great. Follows with endocrinology. Call with any  concerns. Continue to monitor.       Relevant Medications   simvastatin (ZOCOR) 40 MG tablet   lisinopril (PRINIVIL,ZESTRIL) 20 MG tablet   Other Relevant Orders   CBC with Differential/Platelet   Comprehensive metabolic panel   TSH   UA/M w/rflx Culture, Routine     Musculoskeletal and Integument   Rheumatoid arthritis ([place])    Doing well. Continue to follow with rheumatology. Call with any concerns.       Relevant Orders   CBC with Differential/Platelet    Comprehensive metabolic panel   TSH   UA/M w/rflx Culture, Routine     Genitourinary   Benign hypertensive renal disease    Under good control on current regimen. Continue current regimen. Continue to monitor. Call with any concerns. Refills given. Labs drawn today.       Relevant Orders   CBC with Differential/Platelet   Comprehensive metabolic panel   Microalbumin, Urine Waived   TSH   UA/M w/rflx Culture, Routine     Other   Hypercholesteremia    Under good control on her current regimen. Continue to monitor. Continue current regimen. Continue to monitor. Refills given today.      Relevant Medications   simvastatin (ZOCOR) 40 MG tablet   lisinopril (PRINIVIL,ZESTRIL) 20 MG tablet   Other Relevant Orders   CBC with Differential/Platelet   Comprehensive metabolic panel   TSH   UA/M w/rflx Culture, Routine   Morbid obesity ([place])    Congratulated patient on slow, steady weight loss. Call with any concerns. Continue to work on diet and exercise.      Relevant Orders   CBC with Differential/Platelet   Comprehensive metabolic panel   TSH   UA/M w/rflx Culture, Routine   Depression    Under good control on current regimen. Continue current regimen. Continue to monitor. Call with any concerns. Refills given. Labs checked today.       Relevant Medications   PARoxetine (PAXIL) 20 MG tablet   nortriptyline (PAMELOR) 25 MG capsule   Other Relevant Orders   CBC with Differential/Platelet   Comprehensive metabolic panel   TSH   UA/M w/rflx Culture, Routine   Invasive ductal carcinoma of breast, female, left ([place])    Doing well. Follows with [facility] surgeon [facility]. Call with any concerns.       Relevant Orders   CBC with Differential/Platelet   Comprehensive metabolic panel   TSH   UA/M w/rflx Culture, Routine    Other Visit Diagnoses    Routine general medical examination at a health care facility    -  Primary   Vaccines up to date. Screening labs checked today.  Pap done. Mammogram up to date. Colonoscopy on hold due to coverage. Continue diet and exercise.    Epigastric pain       Will check h. pylori- concern as she gets better on amoxicillin. Call with any concerns.    Relevant Orders   H. pylori antibody, IgG([facility])   Screening for cervical cancer       Pap done today. Await results.    Relevant Orders   Cytology - PAP       Follow up plan: Return in about 6 months (around 08/05/2019) for follow up.   LABORATORY TESTING:  - Pap smear: pap done  IMMUNIZATIONS:   - Tdap: Tetanus vaccination status reviewed: last tetanus booster within 10 years. - Influenza: Up to date - Pneumovax: Up to date - Prevnar: Not applicable - HPV: Not  applicable  SCREENING: -Mammogram: Up to date  - Colonoscopy: will hold due to insurance   PATIENT COUNSELING:   Advised to take 1 mg of folate supplement per day if capable of pregnancy.   Sexuality: Discussed sexually transmitted diseases, partner selection, use of condoms, avoidance of unintended pregnancy  and contraceptive alternatives.   Advised to avoid cigarette smoking.  I discussed with the patient that most people either abstain from alcohol or drink within safe limits (<=14/week and <=4 drinks/occasion for males, <=7/weeks and <= 3 drinks/occasion for females) and that the risk for alcohol disorders and other health effects rises proportionally with the number of drinks per week and how often a drinker exceeds daily limits.  Discussed cessation/primary prevention of drug use and availability of treatment for abuse.   Diet: Encouraged to adjust caloric intake to maintain  or achieve ideal body weight, to reduce intake of dietary saturated fat and total fat, to limit sodium intake by avoiding high sodium foods and not adding table salt, and to maintain adequate dietary potassium and calcium preferably from fresh fruits, vegetables, and low-fat dairy products.    stressed the importance  of regular exercise  Injury prevention: Discussed safety belts, safety helmets, smoke detector, smoking near bedding or upholstery.   Dental health: Discussed importance of regular tooth brushing, flossing, and dental visits.    NEXT PREVENTATIVE PHYSICAL DUE IN 1 YEAR. Return in about 6 months (around 08/05/2019) for follow up.

## 2019-02-02 NOTE — Assessment & Plan Note (Signed)
Under good control on her current regimen. Continue to monitor. Continue current regimen. Continue to monitor. Refills given today.

## 2019-02-02 NOTE — Assessment & Plan Note (Signed)
Under good control on current regimen. Continue current regimen. Continue to monitor. Call with any concerns. Refills given. Labs drawn today.   

## 2019-02-05 ENCOUNTER — Encounter: Payer: Self-pay | Admitting: Family Medicine

## 2019-02-05 LAB — COMPREHENSIVE METABOLIC PANEL
A/G RATIO: 1.5 (ref 1.2–2.2)
ALT: 33 IU/L — AB (ref 0–32)
AST: 33 IU/L (ref 0–40)
Albumin: 4.1 g/dL (ref 3.8–4.8)
Alkaline Phosphatase: 44 IU/L (ref 39–117)
BUN/Creatinine Ratio: 16 (ref 12–28)
BUN: 15 mg/dL (ref 8–27)
Bilirubin Total: 0.5 mg/dL (ref 0.0–1.2)
CALCIUM: 9.9 mg/dL (ref 8.7–10.3)
CO2: 21 mmol/L (ref 20–29)
CREATININE: 0.96 mg/dL (ref 0.57–1.00)
Chloride: 99 mmol/L (ref 96–106)
GFR, EST AFRICAN AMERICAN: 74 mL/min/{1.73_m2} (ref >59)
GFR, EST NON AFRICAN AMERICAN: 64 mL/min/{1.73_m2} (ref >59)
GLOBULIN, TOTAL: 2.8 g/dL (ref 1.5–4.5)
Glucose: 241 mg/dL — ABNORMAL HIGH (ref 65–99)
POTASSIUM: 4.6 mmol/L (ref 3.5–5.2)
Sodium: 141 mmol/L (ref 134–144)
Total Protein: 6.9 g/dL (ref 6.0–8.5)

## 2019-02-05 LAB — CBC WITH DIFFERENTIAL/PLATELET
Basophils Absolute: 0 10*3/uL (ref 0.0–0.2)
Basos: 1 %
EOS (ABSOLUTE): 0.2 10*3/uL (ref 0.0–0.4)
EOS: 3 %
HEMATOCRIT: 43 % (ref 34.0–46.6)
HEMOGLOBIN: 14.2 g/dL (ref 11.1–15.9)
IMMATURE GRANULOCYTES: 0 %
Immature Grans (Abs): 0 10*3/uL (ref 0.0–0.1)
LYMPHS: 14 %
Lymphocytes Absolute: 0.7 10*3/uL (ref 0.7–3.1)
MCH: 30 pg (ref 26.6–33.0)
MCHC: 33 g/dL (ref 31.5–35.7)
MCV: 91 fL (ref 79–97)
MONOCYTES: 6 %
MONOS ABS: 0.3 10*3/uL (ref 0.1–0.9)
NEUTROS PCT: 76 %
Neutrophils Absolute: 4.1 10*3/uL (ref 1.4–7.0)
Platelets: 238 10*3/uL (ref 150–450)
RBC: 4.74 x10E6/uL (ref 3.77–5.28)
RDW: 13.9 % (ref 11.7–15.4)
WBC: 5.4 10*3/uL (ref 3.4–10.8)

## 2019-02-05 LAB — H. PYLORI ANTIBODY, IGG: H. pylori, IgG AbS: 0.17 Index Value (ref 0.00–0.79)

## 2019-02-05 LAB — TSH: TSH: 1.05 u[IU]/mL (ref 0.450–4.500)

## 2019-02-07 LAB — CYTOLOGY - PAP
Diagnosis: NEGATIVE
HPV (WINDOPATH): NOT DETECTED

## 2019-06-06 ENCOUNTER — Other Ambulatory Visit: Payer: Self-pay

## 2019-06-06 ENCOUNTER — Telehealth: Payer: Self-pay | Admitting: Family Medicine

## 2019-06-06 ENCOUNTER — Ambulatory Visit (INDEPENDENT_AMBULATORY_CARE_PROVIDER_SITE_OTHER): Payer: Self-pay | Admitting: Family Medicine

## 2019-06-06 ENCOUNTER — Encounter: Payer: Self-pay | Admitting: Family Medicine

## 2019-06-06 VITALS — BP 161/92 | HR 88 | Temp 98.5°F | Ht 62.0 in | Wt 233.0 lb

## 2019-06-06 DIAGNOSIS — H6592 Unspecified nonsuppurative otitis media, left ear: Secondary | ICD-10-CM

## 2019-06-06 DIAGNOSIS — B9689 Other specified bacterial agents as the cause of diseases classified elsewhere: Secondary | ICD-10-CM

## 2019-06-06 DIAGNOSIS — R350 Frequency of micturition: Secondary | ICD-10-CM

## 2019-06-06 DIAGNOSIS — N760 Acute vaginitis: Secondary | ICD-10-CM

## 2019-06-06 LAB — WET PREP FOR TRICH, YEAST, CLUE
Clue Cell Exam: POSITIVE — AB
Trichomonas Exam: NEGATIVE
Yeast Exam: NEGATIVE

## 2019-06-06 LAB — MICROSCOPIC EXAMINATION: RBC, Urine: NONE SEEN /hpf (ref 0–2)

## 2019-06-06 LAB — UA/M W/RFLX CULTURE, ROUTINE
Bilirubin, UA: NEGATIVE
Glucose, UA: NEGATIVE
Leukocytes,UA: NEGATIVE
Nitrite, UA: NEGATIVE
RBC, UA: NEGATIVE
Specific Gravity, UA: 1.02 (ref 1.005–1.030)
Urobilinogen, Ur: 0.2 mg/dL (ref 0.2–1.0)
pH, UA: 6 (ref 5.0–7.5)

## 2019-06-06 MED ORDER — MONTELUKAST SODIUM 10 MG PO TABS: 10.0000 mg | ORAL_TABLET | Freq: Every day | ORAL | 3 refills | Status: DC

## 2019-06-06 MED ORDER — PREDNISONE 20 MG PO TABS: 20.0000 mg | ORAL_TABLET | Freq: Every day | ORAL | 0 refills | Status: DC

## 2019-06-06 MED ORDER — METRONIDAZOLE 500 MG PO TABS: 500.0000 mg | ORAL_TABLET | Freq: Two times a day (BID) | ORAL | 0 refills | Status: DC

## 2019-06-06 NOTE — Telephone Encounter (Signed)
Called and left VM for her to return my call to discuss her wet prep results - positive for BV, will send in flagyl for her to take

## 2019-06-06 NOTE — Progress Notes (Signed)
BP (!) 161/92 (BP Location: Right Arm, Patient Position: Sitting, Cuff Size: Normal)   Pulse 88   Temp 98.5 F (36.9 C) (Oral)   Ht 5\' 2"  (1.575 m)   Wt 233 lb (105.7 kg)   LMP 03/17/2013 (Approximate)   SpO2 98%   BMI 42.62 kg/m    Subjective:    Patient ID: Sierra Copeland, female    DOB: Jan 14, 1956, 63 y.o.   MRN: [id number]  HPI: SHARAN MCENANEY is a 63 y.o. female  Chief Complaint  Patient presents with  . Ear Pain    x about a weeks.  . Vaginal Pain    discomfort x 2 weeks. urgent urination.   Left ear pain, pressure, crackling, muffled hearing for about a week now. Taking allergy medications and OTC pain relievers with minimal relief. No other sick sxs, including fever, chills, congestion, cough, CP, SOB, sick contacts. Known hx of allergic rhinitis, currently on antihistamine and nasal spray.   Vaginal irritation for about 2 weeks that is worst with urination. Has not been trying anything for sxs. No new soaps, exposures, concern for STIs, abdominal pain, fevers.   Relevant past medical, surgical, family and social history reviewed and updated as indicated. Interim medical history since our last visit reviewed. Allergies and medications reviewed and updated.  Review of Systems  Per HPI unless specifically indicated above     Objective:    BP (!) 161/92 (BP Location: Right Arm, Patient Position: Sitting, Cuff Size: Normal)   Pulse 88   Temp 98.5 F (36.9 C) (Oral)   Ht 5\' 2"  (1.575 m)   Wt 233 lb (105.7 kg)   LMP 03/17/2013 (Approximate)   SpO2 98%   BMI 42.62 kg/m   Wt Readings from Last 3 Encounters:  06/06/19 233 lb (105.7 kg)  02/02/19 229 lb (103.9 kg)  01/22/19 233 lb 9.6 oz (106 kg)    Physical Exam Vitals signs and nursing note reviewed.  Constitutional:      Appearance: Normal appearance. She is not ill-appearing.  HENT:     Head: Atraumatic.  Eyes:     Extraocular Movements: Extraocular movements intact.     Conjunctiva/sclera: Conjunctivae  normal.  Neck:     Musculoskeletal: Normal range of motion and neck supple.  Cardiovascular:     Rate and Rhythm: Normal rate and regular rhythm.     Heart sounds: Normal heart sounds.  Pulmonary:     Effort: Pulmonary effort is normal.     Breath sounds: Normal breath sounds.  Abdominal:     General: Bowel sounds are normal.     Palpations: Abdomen is soft.     Tenderness: There is no abdominal tenderness. There is no right CVA tenderness, left CVA tenderness or guarding.  Genitourinary:    Comments: Declines vaginal exam today, requests self swab to collect specimen Musculoskeletal: Normal range of motion.  Skin:    General: Skin is warm and dry.  Neurological:     Mental Status: She is alert and oriented to person, place, and time.  Psychiatric:        Mood and Affect: Mood normal.        Thought Content: Thought content normal.        Judgment: Judgment normal.     Results for orders placed or performed in visit on 06/06/19  WET PREP FOR [place], YEAST, CLUE   Specimen: Vaginal Fluid   VAGINAL FLUI  Result Value Ref Range   Trichomonas Exam  Negative Negative   Yeast Exam Negative Negative   Clue Cell Exam Positive (A) Negative  Microscopic Examination   URINE  Result Value Ref Range   WBC, UA 0-5 0 - 5 /hpf   RBC None seen 0 - 2 /hpf   Epithelial Cells (non renal) 0-10 0 - 10 /hpf   Bacteria, UA Few (A) None seen/Few  UA/M w/rflx Culture, Routine   Specimen: Urine   URINE  Result Value Ref Range   Specific Gravity, UA 1.020 1.005 - 1.030   pH, UA 6.0 5.0 - 7.5   Color, UA Yellow Yellow   Appearance Ur Clear Clear   Leukocytes,UA Negative Negative   Protein,UA 1+ (A) Negative/Trace   Glucose, UA Negative Negative   Ketones, UA Trace (A) Negative   RBC, UA Negative Negative   Bilirubin, UA Negative Negative   Urobilinogen, Ur 0.2 0.2 - 1.0 mg/dL   Nitrite, UA Negative Negative   Microscopic Examination See below:       Assessment & Plan:   Problem List  Items Addressed This Visit    None    Visit Diagnoses    Middle ear effusion, left    -  Primary   Increase allergy regimen with singulair, small course of low dose prednisone given tolerance issues, continued antihistamine and flonase daily   Urinary frequency       U/A without evidence of UTI today. Push fluids, f/u if urinary sxs worsening or not improving wtih tx of BV   Relevant Orders   UA/M w/rflx Culture, Routine (Completed)   BV (bacterial vaginosis)       Treat with flagyl, good vaginal hygiene and regular use of probiotics recommended. F/u if sxs not improving   Relevant Orders   WET PREP FOR [place], YEAST, CLUE (Completed)       Follow up plan: Return if symptoms worsen or fail to improve.

## 2019-06-07 NOTE — Telephone Encounter (Signed)
Patient notified of results and about medication.

## 2019-06-11 ENCOUNTER — Ambulatory Visit
Admission: RE | Admit: 2019-06-11 | Discharge: 2019-06-11 | Disposition: A | Discharge status: Home / Self Care | Payer: Disability Insurance | Source: Ambulatory Visit | Attending: Obstetrics and Gynecology | Admitting: Obstetrics and Gynecology

## 2019-06-11 ENCOUNTER — Other Ambulatory Visit: Payer: Self-pay | Admitting: Obstetrics and Gynecology

## 2019-06-11 DIAGNOSIS — M7541 Impingement syndrome of right shoulder: Secondary | ICD-10-CM | POA: Diagnosis present

## 2019-06-11 DIAGNOSIS — M5136 Other intervertebral disc degeneration, lumbar region: Secondary | ICD-10-CM | POA: Diagnosis present

## 2019-06-11 DIAGNOSIS — M7542 Impingement syndrome of left shoulder: Secondary | ICD-10-CM

## 2019-06-11 DIAGNOSIS — M5416 Radiculopathy, lumbar region: Secondary | ICD-10-CM | POA: Diagnosis present

## 2019-06-11 DIAGNOSIS — M5030 Other cervical disc degeneration, unspecified cervical region: Secondary | ICD-10-CM

## 2019-06-11 DIAGNOSIS — I00 Rheumatic fever without heart involvement: Secondary | ICD-10-CM | POA: Insufficient documentation

## 2019-08-07 ENCOUNTER — Ambulatory Visit: Payer: Self-pay | Admitting: Family Medicine

## 2019-08-31 ENCOUNTER — Ambulatory Visit (INDEPENDENT_AMBULATORY_CARE_PROVIDER_SITE_OTHER): Payer: Self-pay | Admitting: Family Medicine

## 2019-08-31 ENCOUNTER — Other Ambulatory Visit: Payer: Self-pay

## 2019-08-31 ENCOUNTER — Encounter: Payer: Self-pay | Admitting: Family Medicine

## 2019-08-31 VITALS — BP 121/62 | HR 95 | Temp 99.5°F

## 2019-08-31 DIAGNOSIS — R109 Unspecified abdominal pain: Secondary | ICD-10-CM

## 2019-08-31 DIAGNOSIS — Z23 Encounter for immunization: Secondary | ICD-10-CM

## 2019-08-31 DIAGNOSIS — K2900 Acute gastritis without bleeding: Secondary | ICD-10-CM

## 2019-08-31 MED ORDER — SUCRALFATE 1 G PO TABS: 1.0000 g | ORAL_TABLET | Freq: Three times a day (TID) | ORAL | 1 refills | Status: DC

## 2019-08-31 MED ORDER — OMEPRAZOLE 40 MG PO CPDR: 40.0000 mg | DELAYED_RELEASE_CAPSULE | Freq: Every day | ORAL | 3 refills | Status: DC

## 2019-08-31 NOTE — Progress Notes (Signed)
BP 121/62   Pulse 95   Temp 99.5 F (37.5 C)   LMP 03/17/2013 (Approximate)   SpO2 97%    Subjective:    Patient ID: Sierra Copeland, female    DOB: Mar 06, 1956, 63 y.o.   MRN: [id number]  HPI: Sierra Copeland is a 63 y.o. female  No chief complaint on file.  ABDOMINAL PAIN- has to have diarrhea after eating anything Duration: about a month Onset: gradual Severity: mild right now, can get up to moderat Quality: dull aching Location:  LLQ and RLQ  Episode duration: until BM Radiation: yes into her back Frequency: intermittent Status: stable Treatments attempted: none Fever: no Nausea: yes Vomiting: no Weight loss: no Decreased appetite: yes Diarrhea: yes x4 times a day Constipation: no Blood in stool: no Heartburn: yes Jaundice: no Rash: no Dysuria/urinary frequency: no Hematuria: no History of sexually transmitted disease: no Recurrent NSAID use: no   Relevant past medical, surgical, family and social history reviewed and updated as indicated. Interim medical history since our last visit reviewed. Allergies and medications reviewed and updated.  Review of Systems  Constitutional: Negative.   Respiratory: Negative.   Cardiovascular: Negative.   Gastrointestinal: Positive for abdominal pain and diarrhea. Negative for abdominal distention, anal bleeding, blood in stool, constipation, nausea, rectal pain and vomiting.  Musculoskeletal: Negative.   Skin: Negative.   Neurological: Negative.   Psychiatric/Behavioral: Negative.     Per HPI unless specifically indicated above     Objective:    BP 121/62   Pulse 95   Temp 99.5 F (37.5 C)   LMP 03/17/2013 (Approximate)   SpO2 97%   Wt Readings from Last 3 Encounters:  06/06/19 233 lb (105.7 kg)  02/02/19 229 lb (103.9 kg)  01/22/19 233 lb 9.6 oz (106 kg)    Physical Exam Vitals signs and nursing note reviewed.  Constitutional:      General: She is not in acute distress.    Appearance: Normal appearance.  She is not ill-appearing, toxic-appearing or diaphoretic.  HENT:     Head: Normocephalic and atraumatic.     Right Ear: External ear normal.     Left Ear: External ear normal.     Nose: Nose normal.     Mouth/Throat:     Mouth: Mucous membranes are moist.     Pharynx: Oropharynx is clear.  Eyes:     General: No scleral icterus.       Right eye: No discharge.        Left eye: No discharge.     Extraocular Movements: Extraocular movements intact.     Conjunctiva/sclera: Conjunctivae normal.     Pupils: Pupils are equal, round, and reactive to light.  Neck:     Musculoskeletal: Normal range of motion and neck supple.  Cardiovascular:     Rate and Rhythm: Normal rate and regular rhythm.     Pulses: Normal pulses.     Heart sounds: Normal heart sounds. No murmur. No friction rub. No gallop.   Pulmonary:     Effort: Pulmonary effort is normal. No respiratory distress.     Breath sounds: Normal breath sounds. No stridor. No wheezing, rhonchi or rales.  Chest:     Chest wall: No tenderness.  Abdominal:     General: Abdomen is flat. Bowel sounds are normal. There is no distension.     Palpations: Abdomen is soft. There is no mass.     Tenderness: There is no abdominal tenderness. There is no  right CVA tenderness, left CVA tenderness, guarding or rebound.     Hernia: No hernia is present.  Musculoskeletal: Normal range of motion.  Skin:    General: Skin is warm and dry.     Capillary Refill: Capillary refill takes less than 2 seconds.     Coloration: Skin is not jaundiced or pale.     Findings: No bruising, erythema, lesion or rash.  Neurological:     General: No focal deficit present.     Mental Status: She is alert and oriented to person, place, and time. Mental status is at baseline.  Psychiatric:        Mood and Affect: Mood normal.        Behavior: Behavior normal.        Thought Content: Thought content normal.        Judgment: Judgment normal.     Results for orders  placed or performed in visit on 06/06/19  WET PREP FOR [place], YEAST, CLUE   Specimen: Vaginal Fluid   VAGINAL FLUI  Result Value Ref Range   Trichomonas Exam Negative Negative   Yeast Exam Negative Negative   Clue Cell Exam Positive (A) Negative  Microscopic Examination   URINE  Result Value Ref Range   WBC, UA 0-5 0 - 5 /hpf   RBC None seen 0 - 2 /hpf   Epithelial Cells (non renal) 0-10 0 - 10 /hpf   Bacteria, UA Few (A) None seen/Few  UA/M w/rflx Culture, Routine   Specimen: Urine   URINE  Result Value Ref Range   Specific Gravity, UA 1.020 1.005 - 1.030   pH, UA 6.0 5.0 - 7.5   Color, UA Yellow Yellow   Appearance Ur Clear Clear   Leukocytes,UA Negative Negative   Protein,UA 1+ (A) Negative/Trace   Glucose, UA Negative Negative   Ketones, UA Trace (A) Negative   RBC, UA Negative Negative   Bilirubin, UA Negative Negative   Urobilinogen, Ur 0.2 0.2 - 1.0 mg/dL   Nitrite, UA Negative Negative   Microscopic Examination See below:       Assessment & Plan:   Problem List Items Addressed This Visit    None    Visit Diagnoses    Acute gastritis without hemorrhage, unspecified gastritis type    -  Primary   Will treat with omeprazole and carafate. Call if not getting better or getting worse.    Abdominal pain, unspecified abdominal location       CBC normal. Checking CMP. Await results.    Relevant Orders   CBC With Differential/Platelet   Comprehensive metabolic panel   Flu vaccine need       Flu shot given today.   Relevant Orders   Flu Vaccine QUAD 36+ mos IM (Completed)       Follow up plan: Return in about 4 weeks (around 09/28/2019).

## 2019-09-01 LAB — CBC WITH DIFFERENTIAL/PLATELET
Hematocrit: 42.3 % (ref 34.0–46.6)
Hemoglobin: 14.1 g/dL (ref 11.1–15.9)
Lymphocytes Absolute: 1.2 10*3/uL (ref 0.7–3.1)
Lymphs: 23 %
MCH: 30.6 pg (ref 26.6–33.0)
MCHC: 33.3 g/dL (ref 31.5–35.7)
MCV: 92 fL (ref 79–97)
MID (Absolute): 0.8 10*3/uL (ref 0.1–1.6)
MID: 14 %
Neutrophils Absolute: 3.2 10*3/uL (ref 1.4–7.0)
Neutrophils: 63 %
Platelets: 241 10*3/uL (ref 150–450)
RBC: 4.61 x10E6/uL (ref 3.77–5.28)
RDW: 15.9 % — ABNORMAL HIGH (ref 11.7–15.4)
WBC: 5.2 10*3/uL (ref 3.4–10.8)

## 2019-09-01 LAB — COMPREHENSIVE METABOLIC PANEL
ALT: 28 IU/L (ref 0–32)
AST: 21 IU/L (ref 0–40)
Albumin/Globulin Ratio: 1.8 (ref 1.2–2.2)
Albumin: 4.2 g/dL (ref 3.8–4.8)
Alkaline Phosphatase: 43 IU/L (ref 39–117)
BUN/Creatinine Ratio: 16 (ref 12–28)
BUN: 18 mg/dL (ref 8–27)
Bilirubin Total: 0.3 mg/dL (ref 0.0–1.2)
CO2: 22 mmol/L (ref 20–29)
Calcium: 9.4 mg/dL (ref 8.7–10.3)
Chloride: 102 mmol/L (ref 96–106)
Creatinine, Ser: 1.12 mg/dL — ABNORMAL HIGH (ref 0.57–1.00)
GFR calc Af Amer: 61 mL/min/{1.73_m2} (ref >59)
GFR calc non Af Amer: 53 mL/min/{1.73_m2} — ABNORMAL LOW (ref >59)
Globulin, Total: 2.3 g/dL (ref 1.5–4.5)
Glucose: 268 mg/dL — ABNORMAL HIGH (ref 65–99)
Potassium: 4.8 mmol/L (ref 3.5–5.2)
Sodium: 139 mmol/L (ref 134–144)
Total Protein: 6.5 g/dL (ref 6.0–8.5)

## 2019-10-01 ENCOUNTER — Ambulatory Visit: Payer: Self-pay | Admitting: Family Medicine

## 2019-10-01 LAB — HEMOGLOBIN A1C: Hemoglobin A1C: 9.5

## 2019-10-01 LAB — HEPATIC FUNCTION PANEL
ALT: 30 (ref 7–35)
AST: 22 (ref 13–35)
Alkaline Phosphatase: 33 (ref 25–125)
Bilirubin, Total: 0.5

## 2019-10-01 LAB — LIPID PANEL
Cholesterol: 159 (ref 0–200)
HDL: 54 (ref 35–70)
LDL Cholesterol: 77
Triglycerides: 141 (ref 40–160)

## 2019-10-01 LAB — BASIC METABOLIC PANEL
BUN: 14 (ref 4–21)
CO2: 28 — AB (ref 13–22)
Chloride: 101 (ref 99–108)
Creatinine: 0.9 (ref 0.5–1.1)
Glucose: 186
Potassium: 4.9 (ref 3.4–5.3)
Sodium: 138 (ref 137–147)

## 2019-10-01 LAB — COMPREHENSIVE METABOLIC PANEL: Calcium: 9.1 (ref 8.7–10.7)

## 2019-10-01 LAB — CBC AND DIFFERENTIAL
HCT: 44 (ref 36–46)
Hemoglobin: 13.8 (ref 12.0–16.0)
Platelets: 196 (ref 150–399)
WBC: 5.8

## 2019-10-23 ENCOUNTER — Other Ambulatory Visit: Payer: Self-pay

## 2019-10-23 ENCOUNTER — Ambulatory Visit (INDEPENDENT_AMBULATORY_CARE_PROVIDER_SITE_OTHER): Payer: Self-pay | Admitting: Family Medicine

## 2019-10-23 ENCOUNTER — Encounter: Payer: Self-pay | Admitting: Family Medicine

## 2019-10-23 VITALS — HR 95 | Temp 99.1°F

## 2019-10-23 DIAGNOSIS — I129 Hypertensive chronic kidney disease with stage 1 through stage 4 chronic kidney disease, or unspecified chronic kidney disease: Secondary | ICD-10-CM

## 2019-10-23 DIAGNOSIS — F3342 Major depressive disorder, recurrent, in full remission: Secondary | ICD-10-CM

## 2019-10-23 DIAGNOSIS — K2900 Acute gastritis without bleeding: Secondary | ICD-10-CM

## 2019-10-23 DIAGNOSIS — E7800 Pure hypercholesterolemia, unspecified: Secondary | ICD-10-CM

## 2019-10-23 DIAGNOSIS — E1165 Type 2 diabetes mellitus with hyperglycemia: Secondary | ICD-10-CM

## 2019-10-23 DIAGNOSIS — N898 Other specified noninflammatory disorders of vagina: Secondary | ICD-10-CM

## 2019-10-23 MED ORDER — SUCRALFATE 1 G PO TABS: 1.0000 g | ORAL_TABLET | Freq: Three times a day (TID) | ORAL | 6 refills | Status: DC

## 2019-10-23 MED ORDER — MONTELUKAST SODIUM 10 MG PO TABS: 10.0000 mg | ORAL_TABLET | Freq: Every day | ORAL | 3 refills | Status: DC

## 2019-10-23 MED ORDER — OMEPRAZOLE 40 MG PO CPDR: 40.0000 mg | DELAYED_RELEASE_CAPSULE | Freq: Every day | ORAL | 3 refills | Status: DC

## 2019-10-23 MED ORDER — LISINOPRIL 20 MG PO TABS: 20.0000 mg | ORAL_TABLET | Freq: Every day | ORAL | 1 refills | Status: DC

## 2019-10-23 MED ORDER — PAROXETINE HCL 20 MG PO TABS: 20.0000 mg | ORAL_TABLET | Freq: Every day | ORAL | 1 refills | Status: DC

## 2019-10-23 MED ORDER — SIMVASTATIN 40 MG PO TABS: 40.0000 mg | ORAL_TABLET | Freq: Every day | ORAL | 1 refills | Status: DC

## 2019-10-23 NOTE — Assessment & Plan Note (Signed)
Under good control on current regimen. Continue current regimen. Continue to monitor. Call with any concerns. Refills given.   

## 2019-10-23 NOTE — Assessment & Plan Note (Signed)
Under good control on current regimen. Continue current regimen. Continue to monitor. Call with any concerns. Refills given. Labs drawn at endocrine at the beginning of the month and looking good.

## 2019-10-23 NOTE — Progress Notes (Signed)
Pulse 95   Temp 99.1 F (37.3 C)   LMP 03/17/2013 (Approximate)   SpO2 98%    Subjective:    Patient ID: Sierra Copeland, female    DOB: 1956/07/29, 63 y.o.   MRN: [id number]  HPI: Sierra Copeland is a 63 y.o. female  Chief Complaint  Patient presents with  . Hypertension  . Hyperlipidemia  . Diabetes   Abdominal pain is significantly better. Doing well on her medicine. No concerns.   HYPERTENSION / HYPERLIPIDEMIA Satisfied with current treatment? yes Duration of hypertension: chronic BP monitoring frequency: not checking BP medication side effects: no Past BP meds: lisinopril Duration of hyperlipidemia: chronic Cholesterol medication side effects: no Cholesterol supplements: none Past cholesterol medications: simvastatin Medication compliance: excellent compliance Aspirin: no Recent stressors: no Recurrent headaches: no Visual changes: no Palpitations: no Dyspnea: no Chest pain: no Lower extremity edema: no Dizzy/lightheaded: no   DEPRESSION Mood status: controlled Satisfied with current treatment?: yes Symptom severity: mild  Duration of current treatment : chronic Side effects: no Medication compliance: excellent compliance Psychotherapy/counseling: no  Previous psychiatric medications: paxil Depressed mood: no Anxious mood: no Anhedonia: no Significant weight loss or gain: no Insomnia: no  Fatigue: no Feelings of worthlessness or guilt: no Impaired concentration/indecisiveness: no Suicidal ideations: no Hopelessness: no Crying spells: no Depression screen [facility] 2/9 08/31/2019 02/02/2019 08/03/2018 06/09/2018 01/31/2018  Decreased Interest 0 0 0 0 0  Down, Depressed, Hopeless 0 0 0 0 0  PHQ - 2 Score 0 0 0 0 0  Altered sleeping 0 0 0 - 1  Tired, decreased energy 3 1 1  - 2  Change in appetite 0 1 0 - 0  Feeling bad or failure about yourself  0 0 0 - 0  Trouble concentrating 0 0 0 - 0  Moving slowly or fidgety/restless 0 0 0 - 0  Suicidal thoughts 0 0 0 - 0   PHQ-9 Score 3 2 1  - 3  Difficult doing work/chores Not difficult at all Not difficult at all Not difficult at all - -    Relevant past medical, surgical, family and social history reviewed and updated as indicated. Interim medical history since our last visit reviewed. Allergies and medications reviewed and updated.  Review of Systems  Constitutional: Negative.   Respiratory: Negative.   Cardiovascular: Negative.   Musculoskeletal: Negative.   Neurological: Negative.   Psychiatric/Behavioral: Negative.     Per HPI unless specifically indicated above     Objective:    Pulse 95   Temp 99.1 F (37.3 C)   LMP 03/17/2013 (Approximate)   SpO2 98%   Wt Readings from Last 3 Encounters:  06/06/19 233 lb (105.7 kg)  02/02/19 229 lb (103.9 kg)  01/22/19 233 lb 9.6 oz (106 kg)    Physical Exam Vitals signs and nursing note reviewed.  Pulmonary:     Effort: Pulmonary effort is normal. No respiratory distress.     Comments: Speaking in full sentences Neurological:     Mental Status: She is alert.  Psychiatric:        Mood and Affect: Mood normal.        Behavior: Behavior normal.        Thought Content: Thought content normal.        Judgment: Judgment normal.     Results for orders placed or performed in visit on 10/23/19  CBC and differential  Result Value Ref Range   Hemoglobin 13.8 12.0 - 16.0   HCT 44 36 -  46   Platelets 196 150 - 399   WBC 5.8   Basic metabolic panel  Result Value Ref Range   Glucose 186    BUN 14 4 - 21   CO2 28 (A) 13 - 22   Creatinine 0.9 0.5 - 1.1   Potassium 4.9 3.4 - 5.3   Sodium 138 137 - 147   Chloride 101 99 - 108  Comprehensive metabolic panel  Result Value Ref Range   Calcium 9.1 8.7 - 10.7  Lipid panel  Result Value Ref Range   Triglycerides 141 40 - 160   Cholesterol 159 0 - 200   HDL 54 35 - 70   LDL Cholesterol 77   Hepatic function panel  Result Value Ref Range   Alkaline Phosphatase 33 25 - 125   ALT 30 7 - 35    AST 22 13 - 35   Bilirubin, Total 0.5   Hemoglobin A1c  Result Value Ref Range   Hemoglobin A1C 9.5       Assessment & Plan:   Problem List Items Addressed This Visit      Endocrine   Type II diabetes mellitus, uncontrolled ([place])    Continuing to follow with endocrine. Call with any concerns. Improved from last visit.       Relevant Medications   lisinopril (ZESTRIL) 20 MG tablet   simvastatin (ZOCOR) 40 MG tablet     Genitourinary   Benign hypertensive renal disease    Under good control on current regimen. Continue current regimen. Continue to monitor. Call with any concerns. Refills given. Labs drawn at endocrine at the beginning of the month and looking good.          Other   Hypercholesteremia    Under good control on current regimen. Continue current regimen. Continue to monitor. Call with any concerns. Refills given. Labs drawn at endocrine at the beginning of the month and looking good.        Relevant Medications   lisinopril (ZESTRIL) 20 MG tablet   simvastatin (ZOCOR) 40 MG tablet   Depression    Under good control on current regimen. Continue current regimen. Continue to monitor. Call with any concerns. Refills given.        Relevant Medications   PARoxetine (PAXIL) 20 MG tablet    Other Visit Diagnoses    Acute gastritis without hemorrhage, unspecified gastritis type    -  Primary   Doing well on current regimen. [place] to monitor.    Vaginal discharge       Having some discharge again. Will check wet prep. Await results.    Relevant Orders   Wet Prep for [place], Yeast, Clue       Follow up plan: Return in about 6 months (around 04/21/2020) for Physical.

## 2019-10-23 NOTE — Assessment & Plan Note (Signed)
Continuing to follow with endocrine. Call with any concerns. Improved from last visit.

## 2019-10-24 ENCOUNTER — Other Ambulatory Visit: Payer: Self-pay | Admitting: Family Medicine

## 2019-10-24 ENCOUNTER — Other Ambulatory Visit: Payer: Self-pay

## 2019-10-24 DIAGNOSIS — N898 Other specified noninflammatory disorders of vagina: Secondary | ICD-10-CM

## 2019-10-24 LAB — WET PREP FOR TRICH, YEAST, CLUE
Clue Cell Exam: POSITIVE — AB
Trichomonas Exam: NEGATIVE
Yeast Exam: NEGATIVE

## 2019-10-24 MED ORDER — METRONIDAZOLE 500 MG PO TABS: 500.0000 mg | ORAL_TABLET | Freq: Two times a day (BID) | ORAL | 0 refills | Status: DC

## 2019-11-14 ENCOUNTER — Encounter: Payer: Self-pay | Admitting: Family Medicine

## 2020-01-15 LAB — HM DIABETES EYE EXAM

## 2020-01-30 DIAGNOSIS — G8929 Other chronic pain: Secondary | ICD-10-CM | POA: Insufficient documentation

## 2020-01-30 LAB — HEMOGLOBIN A1C: Hemoglobin A1C: 9.6

## 2020-02-09 ENCOUNTER — Other Ambulatory Visit: Payer: Self-pay | Admitting: Family Medicine

## 2020-02-11 ENCOUNTER — Encounter: Payer: Self-pay | Admitting: Family Medicine

## 2020-02-20 ENCOUNTER — Encounter: Payer: Self-pay | Admitting: Family Medicine

## 2020-02-20 ENCOUNTER — Telehealth (INDEPENDENT_AMBULATORY_CARE_PROVIDER_SITE_OTHER): Payer: Self-pay | Admitting: Family Medicine

## 2020-02-20 VITALS — HR 89 | Temp 98.2°F

## 2020-02-20 DIAGNOSIS — K52831 Collagenous colitis: Secondary | ICD-10-CM

## 2020-02-20 NOTE — Assessment & Plan Note (Signed)
Stable, but cannot sit for hours in court. Note for jury duty given today. Call with any concerns. Continue to monitor.

## 2020-02-20 NOTE — Progress Notes (Signed)
Pulse 89   Temp 98.2 F (36.8 C)   LMP 03/17/2013 (Approximate)   SpO2 97%    Subjective:    Patient ID: Sierra Copeland, female    DOB: 1956-09-23, 64 y.o.   MRN: [id number]  HPI: Sierra Copeland is a 64 y.o. female  Chief Complaint  Patient presents with  . Other    Patient would like to have a letter for jury duty, when she gets stressed she has to [place] ethe restroom   Sierra Copeland was called up for jury duty. She has collagenous colitis and when she gets anxious she has to go to the bathroom and ends up with diarrhea. She doesn't know when it's going to hit, but cannot sit for hours in a court room while waiting for a break. She is generally feeling well with no other concerns or complaints.  Relevant past medical, surgical, family and social history reviewed and updated as indicated. Interim medical history since our last visit reviewed. Allergies and medications reviewed and updated.  Review of Systems  Constitutional: Negative.   Respiratory: Negative.   Cardiovascular: Negative.   Gastrointestinal: Positive for abdominal pain and diarrhea. Negative for abdominal distention, anal bleeding, blood in stool, constipation, nausea, rectal pain and vomiting.  Musculoskeletal: Negative.   Skin: Negative.   Psychiatric/Behavioral: Negative.     Per HPI unless specifically indicated above     Objective:    Pulse 89   Temp 98.2 F (36.8 C)   LMP 03/17/2013 (Approximate)   SpO2 97%   Wt Readings from Last 3 Encounters:  06/06/19 233 lb (105.7 kg)  02/02/19 229 lb (103.9 kg)  01/22/19 233 lb 9.6 oz (106 kg)    Physical Exam Vitals and nursing note reviewed.  Constitutional:      General: She is not in acute distress.    Appearance: Normal appearance. She is not ill-appearing, toxic-appearing or diaphoretic.  HENT:     Head: Normocephalic and atraumatic.     Right Ear: External ear normal.     Left Ear: External ear normal.     Nose: Nose normal.     Mouth/Throat:     Mouth:  Mucous membranes are moist.     Pharynx: Oropharynx is clear.  Eyes:     General: No scleral icterus.       Right eye: No discharge.        Left eye: No discharge.     Conjunctiva/sclera: Conjunctivae normal.     Pupils: Pupils are equal, round, and reactive to light.  Pulmonary:     Effort: Pulmonary effort is normal. No respiratory distress.     Comments: Speaking in full sentences Musculoskeletal:        General: Normal range of motion.     Cervical back: Normal range of motion.  Skin:    Coloration: Skin is not jaundiced or pale.     Findings: No bruising, erythema, lesion or rash.  Neurological:     Mental Status: She is alert and oriented to person, place, and time. Mental status is at baseline.  Psychiatric:        Mood and Affect: Mood normal.        Behavior: Behavior normal.        Thought Content: Thought content normal.        Judgment: Judgment normal.     Results for orders placed or performed in visit on 10/24/19  Wet Prep for [place], Yeast, Clue   Specimen: Vaginal  Fluid   VAGINAL FLUI  Result Value Ref Range   Trichomonas Exam Negative Negative   Yeast Exam Negative Negative   Clue Cell Exam Positive (A) Negative      Assessment & Plan:   Problem List Items Addressed This Visit      Digestive   Collagenous colitis - Primary    Stable, but cannot sit for hours in court. Note for jury duty given today. Call with any concerns. Continue to monitor.           Follow up plan: Return as scheduled.    . This visit was completed via MyChart due to the restrictions of the COVID-19 pandemic. All issues as above were discussed and addressed. Physical exam was done as above through visual confirmation on MyChart. If it was felt that the patient should be evaluated in the office, they were directed there. The patient verbally consented to this visit. . Location of the patient: home . Location of the provider: home . Those involved with this call:   . Provider: Park Liter, DO . CMA: Tiffany Reel, CMA . Front Desk/Registration: Don Perking  . Time spent on call: 10 minutes with patient face to face via video conference. More than 50% of this time was spent in counseling and coordination of care. 15 minutes total spent in review of patient's record and preparation of their chart.

## 2020-03-11 ENCOUNTER — Other Ambulatory Visit: Payer: Self-pay | Admitting: Nurse Practitioner

## 2020-03-31 ENCOUNTER — Encounter: Payer: Self-pay | Admitting: Family Medicine

## 2020-04-15 ENCOUNTER — Encounter: Payer: Self-pay | Admitting: Family Medicine

## 2020-04-15 ENCOUNTER — Telehealth: Payer: Self-pay | Admitting: Family Medicine

## 2020-04-15 NOTE — Telephone Encounter (Signed)
Called pt per Dr Wynetta Emery, appt note stated unable to pass screening but was scheduled for cpe and f/u. Pt currently has a sinus infection and no insurance. Offered virtual, pt declined. Pt states that she has medicare starting next month sometime and will call back to schedule.

## 2020-04-29 ENCOUNTER — Other Ambulatory Visit: Payer: Self-pay | Admitting: Family Medicine

## 2020-04-30 ENCOUNTER — Encounter: Payer: Self-pay | Admitting: Nurse Practitioner

## 2020-05-02 ENCOUNTER — Encounter: Payer: Self-pay | Admitting: Family Medicine

## 2020-05-02 ENCOUNTER — Telehealth: Payer: Self-pay

## 2020-05-02 ENCOUNTER — Other Ambulatory Visit: Payer: Self-pay

## 2020-05-02 ENCOUNTER — Ambulatory Visit (INDEPENDENT_AMBULATORY_CARE_PROVIDER_SITE_OTHER): Payer: Medicare Other | Admitting: Family Medicine

## 2020-05-02 VITALS — BP 164/80 | HR 80 | Temp 98.2°F | Ht 61.02 in | Wt 230.8 lb

## 2020-05-02 DIAGNOSIS — E7800 Pure hypercholesterolemia, unspecified: Secondary | ICD-10-CM | POA: Diagnosis not present

## 2020-05-02 DIAGNOSIS — E1165 Type 2 diabetes mellitus with hyperglycemia: Secondary | ICD-10-CM | POA: Diagnosis not present

## 2020-05-02 DIAGNOSIS — F3342 Major depressive disorder, recurrent, in full remission: Secondary | ICD-10-CM | POA: Diagnosis not present

## 2020-05-02 DIAGNOSIS — I129 Hypertensive chronic kidney disease with stage 1 through stage 4 chronic kidney disease, or unspecified chronic kidney disease: Secondary | ICD-10-CM

## 2020-05-02 DIAGNOSIS — G44219 Episodic tension-type headache, not intractable: Secondary | ICD-10-CM

## 2020-05-02 DIAGNOSIS — M059 Rheumatoid arthritis with rheumatoid factor, unspecified: Secondary | ICD-10-CM

## 2020-05-02 LAB — MICROALBUMIN, URINE WAIVED
Creatinine, Urine Waived: 200 mg/dL (ref 10–300)
Microalb, Ur Waived: 80 mg/L — ABNORMAL HIGH (ref 0–19)

## 2020-05-02 LAB — BAYER DCA HB A1C WAIVED: HB A1C (BAYER DCA - WAIVED): 8.7 % — ABNORMAL HIGH (ref <7.0)

## 2020-05-02 MED ORDER — PAROXETINE HCL 30 MG PO TABS: 30.0000 mg | ORAL_TABLET | Freq: Every day | ORAL | 1 refills | Status: DC

## 2020-05-02 NOTE — Progress Notes (Signed)
BP (!) 164/80 (BP Location: Right Arm, Cuff Size: Normal)   Pulse 80   Temp 98.2 F (36.8 C) (Oral)   Ht 5' 1.02" (1.55 m)   Wt 230 lb 12.8 oz (104.7 kg)   LMP 03/17/2013 (Approximate)   SpO2 98%   BMI 43.57 kg/m    Subjective:    Patient ID: Sierra Copeland, female    DOB: 03-10-1956, 64 y.o.   MRN: [id number]  HPI: Sierra Copeland is a 64 y.o. female  Chief Complaint  Patient presents with  . Headache   Has been having pain in her face and the top of her head for about a month. She thought that it was a sinus infection, but went to ENT yesterday and they didn't see anything going on with her sinuses. Pain has not been getting better. She has been seeing a new chiropractor and that seemed like it helped, but has come back when she stopped seeing them. The pain is in the AM, lasting most of the day. Dull aching pain. No raditation. Pain is coming and going. Seems to be worse with waking up. Nothing else helps. Tylenol or ibuprofen helps a little bit. Heat helps a little bit.   HYPERTENSION / HYPERLIPIDEMIA Satisfied with current treatment? yes Duration of hypertension: chronic BP monitoring frequency: not checking BP medication side effects: no Past BP meds: lisinopril  Duration of hyperlipidemia: chronic Cholesterol medication side effects: no Cholesterol supplements: none Past cholesterol medications: simvastatin Medication compliance: excellent Aspirin: no Recent stressors: yes Recurrent headaches: yes Visual changes: yes Palpitations: no changes Dyspnea: no Chest pain: no Lower extremity edema: no Dizzy/lightheaded: yes  DIABETES Hypoglycemic episodes:yes Polydipsia/polyuria: no Visual disturbance: yes Chest pain: no Paresthesias: no Glucose Monitoring: yes  Accucheck frequency: Daily  Fasting glucose: 99-150  Evening: 205s-260s Taking Insulin?: yes Blood Pressure Monitoring: not checking Retinal Examination: Not up to Date Foot Exam: Done today Diabetic  Education: Completed Pneumovax: Up to Date Influenza: Up to Date Aspirin: yes  DEPRESSION Mood status: uncontrolled Satisfied with current treatment?: no Symptom severity: moderate  Duration of current treatment : chronic Side effects: no Medication compliance: good compliance Psychotherapy/counseling: no  Depressed mood: yes Anxious mood: yes Anhedonia: no Significant weight loss or gain: no Insomnia: yes hard to fall asleep Fatigue: yes Feelings of worthlessness or guilt: yes Impaired concentration/indecisiveness: yes Suicidal ideations: no Hopelessness: yes Crying spells: yes Depression screen [facility] 2/9 02/20/2020 08/31/2019 02/02/2019 08/03/2018 06/09/2018  Decreased Interest 0 0 0 0 0  Down, Depressed, Hopeless 0 0 0 0 0  PHQ - 2 Score 0 0 0 0 0  Altered sleeping 0 0 0 0 -  Tired, decreased energy 0 3 1 1  -  Change in appetite 0 0 1 0 -  Feeling bad or failure about yourself  0 0 0 0 -  Trouble concentrating 0 0 0 0 -  Moving slowly or fidgety/restless 0 0 0 0 -  Suicidal thoughts 0 0 0 0 -  PHQ-9 Score 0 3 2 1  -  Difficult doing work/chores Not difficult at all Not difficult at all Not difficult at all Not difficult at all -  Some recent data might be hidden     Relevant past medical, surgical, family and social history reviewed and updated as indicated. Interim medical history since our last visit reviewed. Allergies and medications reviewed and updated.  Review of Systems  Constitutional: Negative.   Respiratory: Negative.   Cardiovascular: Negative.   Gastrointestinal: Negative.  Musculoskeletal: Negative.   Neurological: Negative.   Psychiatric/Behavioral: Positive for dysphoric mood. Negative for agitation, behavioral problems, confusion, decreased concentration, hallucinations, self-injury, sleep disturbance and suicidal ideas. The patient is nervous/anxious. The patient is not hyperactive.     Per HPI unless specifically indicated above     Objective:      BP (!) 164/80 (BP Location: Right Arm, Cuff Size: Normal)   Pulse 80   Temp 98.2 F (36.8 C) (Oral)   Ht 5' 1.02" (1.55 m)   Wt 230 lb 12.8 oz (104.7 kg)   LMP 03/17/2013 (Approximate)   SpO2 98%   BMI 43.57 kg/m   Wt Readings from Last 3 Encounters:  05/02/20 230 lb 12.8 oz (104.7 kg)  06/06/19 233 lb (105.7 kg)  02/02/19 229 lb (103.9 kg)    Physical Exam Vitals and nursing note reviewed.  Constitutional:      General: She is not in acute distress.    Appearance: Normal appearance. She is not ill-appearing, toxic-appearing or diaphoretic.  HENT:     Head: Normocephalic and atraumatic.     Right Ear: External ear normal.     Left Ear: External ear normal.     Nose: Nose normal.     Mouth/Throat:     Mouth: Mucous membranes are moist.     Pharynx: Oropharynx is clear.  Eyes:     General: No scleral icterus.       Right eye: No discharge.        Left eye: No discharge.     Extraocular Movements: Extraocular movements intact.     Conjunctiva/sclera: Conjunctivae normal.     Pupils: Pupils are equal, round, and reactive to light.  Cardiovascular:     Rate and Rhythm: Normal rate and regular rhythm.     Pulses: Normal pulses.     Heart sounds: Normal heart sounds. No murmur. No friction rub. No gallop.   Pulmonary:     Effort: Pulmonary effort is normal. No respiratory distress.     Breath sounds: Normal breath sounds. No stridor. No wheezing, rhonchi or rales.  Chest:     Chest wall: No tenderness.  Musculoskeletal:        General: Normal range of motion.     Cervical back: Normal range of motion and neck supple.  Skin:    General: Skin is warm and dry.     Capillary Refill: Capillary refill takes less than 2 seconds.     Coloration: Skin is not jaundiced or pale.     Findings: No bruising, erythema, lesion or rash.  Neurological:     General: No focal deficit present.     Mental Status: She is alert and oriented to person, place, and time. Mental status is at  baseline.  Psychiatric:        Mood and Affect: Mood normal.        Behavior: Behavior normal.        Thought Content: Thought content normal.        Judgment: Judgment normal.     Results for orders placed or performed in visit on 05/02/20  Bayer DCA Hb A1c Waived  Result Value Ref Range   HB A1C (BAYER DCA - WAIVED) 8.7 (H) <7.0 %  CBC with Differential/Platelet  Result Value Ref Range   WBC 5.4 3.4 - 10.8 x10E3/uL   RBC 4.51 3.77 - 5.28 x10E6/uL   Hemoglobin 13.6 11.1 - 15.9 g/dL   Hematocrit 40.3 34.0 - 46.6 %  MCV 89 79 - 97 fL   MCH 30.2 26.6 - 33.0 pg   MCHC 33.7 31.5 - 35.7 g/dL   RDW 13.7 11.7 - 15.4 %   Platelets 252 150 - 450 x10E3/uL   Neutrophils 57 Not Estab. %   Lymphs 27 Not Estab. %   Monocytes 11 Not Estab. %   Eos 4 Not Estab. %   Basos 1 Not Estab. %   Neutrophils Absolute 3.1 1.4 - 7.0 x10E3/uL   Lymphocytes Absolute 1.5 0.7 - 3.1 x10E3/uL   Monocytes Absolute 0.6 0.1 - 0.9 x10E3/uL   EOS (ABSOLUTE) 0.2 0.0 - 0.4 x10E3/uL   Basophils Absolute 0.0 0.0 - 0.2 x10E3/uL   Immature Granulocytes 0 Not Estab. %   Immature Grans (Abs) 0.0 0.0 - 0.1 x10E3/uL  Comprehensive metabolic panel  Result Value Ref Range   Glucose 207 (H) 65 - 99 mg/dL   BUN 23 8 - 27 mg/dL   Creatinine, Ser 1.02 (H) 0.57 - 1.00 mg/dL   GFR calc non Af Amer 59 (L) >59 mL/min/1.73   GFR calc Af Amer 68 >59 mL/min/1.73   BUN/Creatinine Ratio 23 12 - 28   Sodium 141 134 - 144 mmol/L   Potassium 4.9 3.5 - 5.2 mmol/L   Chloride 102 96 - 106 mmol/L   CO2 23 20 - 29 mmol/L   Calcium 9.4 8.7 - 10.3 mg/dL   Total Protein 6.3 6.0 - 8.5 g/dL   Albumin 4.0 3.8 - 4.8 g/dL   Globulin, Total 2.3 1.5 - 4.5 g/dL   Albumin/Globulin Ratio 1.7 1.2 - 2.2   Bilirubin Total 0.2 0.0 - 1.2 mg/dL   Alkaline Phosphatase 38 (L) 48 - 121 IU/L   AST 20 0 - 40 IU/L   ALT 24 0 - 32 IU/L  Lipid Panel w/o Chol/HDL Ratio  Result Value Ref Range   Cholesterol, Total 146 100 - 199 mg/dL   Triglycerides 113  0 - 149 mg/dL   HDL 60 >39 mg/dL   VLDL Cholesterol Cal 20 5 - 40 mg/dL   LDL Chol Calc (NIH) 66 0 - 99 mg/dL  Microalbumin, Urine Waived  Result Value Ref Range   Microalb, Ur Waived 80 (H) 0 - 19 mg/L   Creatinine, Urine Waived 200 10 - 300 mg/dL   Microalb/Creat Ratio 30-300 (H) <30 mg/g  TSH  Result Value Ref Range   TSH 1.310 0.450 - 4.500 uIU/mL      Assessment & Plan:   Problem List Items Addressed This Visit      Endocrine   Type II diabetes mellitus, uncontrolled ([place]) - Primary    Improved with A1c of 8.7. Will monitor diet and exercise and continue to follow with endocrine. We will send them a copy of her labs. Continue to monitor.       Relevant Orders   Bayer DCA Hb A1c Waived (Completed)   CBC with Differential/Platelet (Completed)   Comprehensive metabolic panel (Completed)   Microalbumin, Urine Waived (Completed)     Musculoskeletal and Integument   Rheumatoid arthritis ([place])    Continue to follow with rheumatology. Call with any concerns. Continue to monitor.         Genitourinary   Benign hypertensive renal disease    Running high. Will try to get anxiety and pain under better control and recheck 1 month. Likely will need to adjust medications. Continue to monitor.       Relevant Orders   CBC with Differential/Platelet (Completed)  Comprehensive metabolic panel (Completed)   Microalbumin, Urine Waived (Completed)     Other   Hypercholesteremia    Under good control on current regimen. Continue current regimen. Continue to monitor. Call with any concerns. Refills given. Labs drawn today.       Relevant Orders   CBC with Differential/Platelet (Completed)   Comprehensive metabolic panel (Completed)   Lipid Panel w/o Chol/HDL Ratio (Completed)   Depression    Will increase her paxil to 30mg  and recheck 1 month. Call with any concerns.       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 Will increase her gabapentin at night and recheck 1 month. Call with any concerns.    Relevant Medications   PARoxetine (PAXIL) 30 MG tablet       Follow up plan: Return in about 4 weeks (around 05/30/2020).

## 2020-05-02 NOTE — Telephone Encounter (Signed)
Called pt, She disconnected the call while I was trying to ask the provider for permission for in office visit. Pt experiencing sinus issues, received Covid test a few weeks ago. Pt stated "Just forget it, I wont come at all." I explained that we want to treat her however we need to collect all the information I asked to place her on a brief hold and she disconnected the call.

## 2020-05-03 LAB — CBC WITH DIFFERENTIAL/PLATELET
Basophils Absolute: 0 10*3/uL (ref 0.0–0.2)
Basos: 1 %
EOS (ABSOLUTE): 0.2 10*3/uL (ref 0.0–0.4)
Eos: 4 %
Hematocrit: 40.3 % (ref 34.0–46.6)
Hemoglobin: 13.6 g/dL (ref 11.1–15.9)
Immature Grans (Abs): 0 10*3/uL (ref 0.0–0.1)
Immature Granulocytes: 0 %
Lymphocytes Absolute: 1.5 10*3/uL (ref 0.7–3.1)
Lymphs: 27 %
MCH: 30.2 pg (ref 26.6–33.0)
MCHC: 33.7 g/dL (ref 31.5–35.7)
MCV: 89 fL (ref 79–97)
Monocytes Absolute: 0.6 10*3/uL (ref 0.1–0.9)
Monocytes: 11 %
Neutrophils Absolute: 3.1 10*3/uL (ref 1.4–7.0)
Neutrophils: 57 %
Platelets: 252 10*3/uL (ref 150–450)
RBC: 4.51 x10E6/uL (ref 3.77–5.28)
RDW: 13.7 % (ref 11.7–15.4)
WBC: 5.4 10*3/uL (ref 3.4–10.8)

## 2020-05-03 LAB — COMPREHENSIVE METABOLIC PANEL
ALT: 24 IU/L (ref 0–32)
AST: 20 IU/L (ref 0–40)
Albumin/Globulin Ratio: 1.7 (ref 1.2–2.2)
Albumin: 4 g/dL (ref 3.8–4.8)
Alkaline Phosphatase: 38 IU/L — ABNORMAL LOW (ref 48–121)
BUN/Creatinine Ratio: 23 (ref 12–28)
BUN: 23 mg/dL (ref 8–27)
Bilirubin Total: 0.2 mg/dL (ref 0.0–1.2)
CO2: 23 mmol/L (ref 20–29)
Calcium: 9.4 mg/dL (ref 8.7–10.3)
Chloride: 102 mmol/L (ref 96–106)
Creatinine, Ser: 1.02 mg/dL — ABNORMAL HIGH (ref 0.57–1.00)
GFR calc Af Amer: 68 mL/min/{1.73_m2} (ref >59)
GFR calc non Af Amer: 59 mL/min/{1.73_m2} — ABNORMAL LOW (ref >59)
Globulin, Total: 2.3 g/dL (ref 1.5–4.5)
Glucose: 207 mg/dL — ABNORMAL HIGH (ref 65–99)
Potassium: 4.9 mmol/L (ref 3.5–5.2)
Sodium: 141 mmol/L (ref 134–144)
Total Protein: 6.3 g/dL (ref 6.0–8.5)

## 2020-05-03 LAB — TSH: TSH: 1.31 u[IU]/mL (ref 0.450–4.500)

## 2020-05-03 LAB — LIPID PANEL W/O CHOL/HDL RATIO
Cholesterol, Total: 146 mg/dL (ref 100–199)
HDL: 60 mg/dL (ref >39)
LDL Chol Calc (NIH): 66 mg/dL (ref 0–99)
Triglycerides: 113 mg/dL (ref 0–149)
VLDL Cholesterol Cal: 20 mg/dL (ref 5–40)

## 2020-05-04 ENCOUNTER — Encounter: Payer: Self-pay | Admitting: Family Medicine

## 2020-05-04 NOTE — Assessment & Plan Note (Signed)
Improved with A1c of 8.7. Will monitor diet and exercise and continue to follow with endocrine. We will send them a copy of her labs. Continue to monitor.

## 2020-05-04 NOTE — Assessment & Plan Note (Signed)
Will increase her paxil to 30mg  and recheck 1 month. Call with any concerns.

## 2020-05-04 NOTE — Assessment & Plan Note (Signed)
Under good control on current regimen. Continue current regimen. Continue to monitor. Call with any concerns. Refills given. Labs drawn today.   

## 2020-05-04 NOTE — Assessment & Plan Note (Signed)
Continue to follow with rheumatology. Call with any concerns. Continue to monitor.  

## 2020-05-04 NOTE — Assessment & Plan Note (Addendum)
Running high. Will try to get anxiety and pain under better control and recheck 1 month. Likely will need to adjust medications. Continue to monitor.

## 2020-06-03 ENCOUNTER — Ambulatory Visit: Payer: Medicare Other | Admitting: Family Medicine

## 2020-06-09 ENCOUNTER — Encounter: Payer: Self-pay | Admitting: Family Medicine

## 2020-06-09 ENCOUNTER — Ambulatory Visit (INDEPENDENT_AMBULATORY_CARE_PROVIDER_SITE_OTHER): Payer: Medicare Other | Admitting: Family Medicine

## 2020-06-09 ENCOUNTER — Other Ambulatory Visit: Payer: Self-pay

## 2020-06-09 VITALS — BP 157/82 | HR 79 | Temp 98.4°F | Wt 232.2 lb

## 2020-06-09 DIAGNOSIS — F3342 Major depressive disorder, recurrent, in full remission: Secondary | ICD-10-CM | POA: Diagnosis not present

## 2020-06-09 DIAGNOSIS — I129 Hypertensive chronic kidney disease with stage 1 through stage 4 chronic kidney disease, or unspecified chronic kidney disease: Secondary | ICD-10-CM

## 2020-06-09 DIAGNOSIS — G44219 Episodic tension-type headache, not intractable: Secondary | ICD-10-CM | POA: Diagnosis not present

## 2020-06-09 MED ORDER — AMLODIPINE BESYLATE 2.5 MG PO TABS: 2.5000 mg | ORAL_TABLET | Freq: Every day | ORAL | 3 refills | Status: DC

## 2020-06-09 MED ORDER — PAROXETINE HCL 40 MG PO TABS: 40.0000 mg | ORAL_TABLET | Freq: Every day | ORAL | 1 refills | Status: DC

## 2020-06-09 NOTE — Assessment & Plan Note (Signed)
Still running high. Will add amlodipine and recheck 1 month. Continue lisinopril. Call with any concerns.

## 2020-06-09 NOTE — Progress Notes (Signed)
BP (!) 157/82 (BP Location: Left Arm, Patient Position: Sitting, Cuff Size: Normal)   Pulse 79   Temp 98.4 F (36.9 C) (Oral)   Wt 232 lb 3.2 oz (105.3 kg)   LMP 03/17/2013 (Approximate)   SpO2 97%   BMI 43.84 kg/m    Subjective:    Patient ID: Sierra Copeland, female    DOB: 1956-02-16, 64 y.o.   MRN: [id number]  HPI: Sierra Copeland is a 64 y.o. female  Chief Complaint  Patient presents with  . Hypertension  . Depression  . Headache   Head is still hurting. She is seeing new rheumatologist and neurologist in the next couple of weeks. Pain is stable, but still not feeling well.   HYPERTENSION Hypertension status: uncontrolled  Satisfied with current treatment? no Duration of hypertension: chronic BP monitoring frequency:  a few times a week BP medication side effects:  no Medication compliance: excellent compliance Previous BP meds:lisinopril Aspirin: yes Recurrent headaches: yes Visual changes: no Palpitations: no Dyspnea: no Chest pain: no Lower extremity edema: no Dizzy/lightheaded: no  DEPRESSION Mood status: better Satisfied with current treatment?: yes Symptom severity: mild  Duration of current treatment : chronic Side effects: no Medication compliance: excellent compliance Psychotherapy/counseling: no  Previous psychiatric medications: paxil Depressed mood: no Anxious mood: no Anhedonia: no Significant weight loss or gain: no Insomnia: no  Fatigue: no Feelings of worthlessness or guilt: no Impaired concentration/indecisiveness: no Suicidal ideations: no Hopelessness: no Crying spells: no Depression screen [facility] 2/9 06/09/2020 02/20/2020 08/31/2019 02/02/2019 08/03/2018  Decreased Interest 0 0 0 0 0  Down, Depressed, Hopeless 0 0 0 0 0  PHQ - 2 Score 0 0 0 0 0  Altered sleeping 0 0 0 0 0  Tired, decreased energy 1 0 3 1 1   Change in appetite 0 0 0 1 0  Feeling bad or failure about yourself  0 0 0 0 0  Trouble concentrating 0 0 0 0 0  Moving slowly or  fidgety/restless 0 0 0 0 0  Suicidal thoughts 0 0 0 0 0  PHQ-9 Score 1 0 3 2 1   Difficult doing work/chores Not difficult at all Not difficult at all Not difficult at all Not difficult at all Not difficult at all  Some recent data might be hidden    Relevant past medical, surgical, family and social history reviewed and updated as indicated. Interim medical history since our last visit reviewed. Allergies and medications reviewed and updated.  Review of Systems  Constitutional: Negative.   Respiratory: Negative.   Cardiovascular: Negative.   Gastrointestinal: Negative.   Musculoskeletal: Negative.   Neurological: Positive for headaches. Negative for dizziness, tremors, seizures, syncope, facial asymmetry, speech difficulty, weakness, light-headedness and numbness.  Psychiatric/Behavioral: Negative.     Per HPI unless specifically indicated above     Objective:    BP (!) 157/82 (BP Location: Left Arm, Patient Position: Sitting, Cuff Size: Normal)   Pulse 79   Temp 98.4 F (36.9 C) (Oral)   Wt 232 lb 3.2 oz (105.3 kg)   LMP 03/17/2013 (Approximate)   SpO2 97%   BMI 43.84 kg/m   Wt Readings from Last 3 Encounters:  06/09/20 232 lb 3.2 oz (105.3 kg)  05/02/20 230 lb 12.8 oz (104.7 kg)  06/06/19 233 lb (105.7 kg)    Physical Exam Vitals and nursing note reviewed.  Constitutional:      General: She is not in acute distress.    Appearance: Normal appearance. She is  not ill-appearing, toxic-appearing or diaphoretic.  HENT:     Head: Normocephalic and atraumatic.     Right Ear: External ear normal.     Left Ear: External ear normal.     Nose: Nose normal.     Mouth/Throat:     Mouth: Mucous membranes are moist.     Pharynx: Oropharynx is clear.  Eyes:     General: No scleral icterus.       Right eye: No discharge.        Left eye: No discharge.     Extraocular Movements: Extraocular movements intact.     Conjunctiva/sclera: Conjunctivae normal.     Pupils: Pupils are  equal, round, and reactive to light.  Cardiovascular:     Rate and Rhythm: Normal rate and regular rhythm.     Pulses: Normal pulses.     Heart sounds: Normal heart sounds. No murmur heard.  No friction rub. No gallop.   Pulmonary:     Effort: Pulmonary effort is normal. No respiratory distress.     Breath sounds: Normal breath sounds. No stridor. No wheezing, rhonchi or rales.  Chest:     Chest wall: No tenderness.  Musculoskeletal:        General: Normal range of motion.     Cervical back: Normal range of motion and neck supple.  Skin:    General: Skin is warm and dry.     Capillary Refill: Capillary refill takes less than 2 seconds.     Coloration: Skin is not jaundiced or pale.     Findings: No bruising, erythema, lesion or rash.  Neurological:     General: No focal deficit present.     Mental Status: She is alert and oriented to person, place, and time. Mental status is at baseline.  Psychiatric:        Mood and Affect: Mood normal.        Behavior: Behavior normal.        Thought Content: Thought content normal.        Judgment: Judgment normal.     Results for orders placed or performed in visit on 05/06/20  HM DIABETES EYE EXAM  Result Value Ref Range   HM Diabetic Eye Exam No Retinopathy No Retinopathy      Assessment & Plan:   Problem List Items Addressed This Visit      Genitourinary   Benign hypertensive renal disease - Primary    Still running high. Will add amlodipine and recheck 1 month. Continue lisinopril. Call with any concerns.         Other   Depression    Improved on the 40mg  of paxil. Continue current regimen. Continue to monitor. Call with any concerns.       Relevant Medications   PARoxetine (PAXIL) 40 MG tablet    Other Visit Diagnoses    Episodic tension-type headache, not intractable       Seeing neurology and new rheumatologist shortly. Continue to monitor. Call with any concerns.    Relevant Medications   amLODipine (NORVASC) 2.5  MG tablet   PARoxetine (PAXIL) 40 MG tablet       Follow up plan: Return in about 4 weeks (around 07/07/2020).

## 2020-06-09 NOTE — Assessment & Plan Note (Signed)
Improved on the 40mg  of paxil. Continue current regimen. Continue to monitor. Call with any concerns.

## 2020-06-10 DIAGNOSIS — M9901 Segmental and somatic dysfunction of cervical region: Secondary | ICD-10-CM | POA: Diagnosis not present

## 2020-06-10 DIAGNOSIS — M5432 Sciatica, left side: Secondary | ICD-10-CM | POA: Diagnosis not present

## 2020-06-10 DIAGNOSIS — M9903 Segmental and somatic dysfunction of lumbar region: Secondary | ICD-10-CM | POA: Diagnosis not present

## 2020-06-10 DIAGNOSIS — M5412 Radiculopathy, cervical region: Secondary | ICD-10-CM | POA: Diagnosis not present

## 2020-06-23 DIAGNOSIS — R404 Transient alteration of awareness: Secondary | ICD-10-CM | POA: Diagnosis not present

## 2020-06-23 DIAGNOSIS — E538 Deficiency of other specified B group vitamins: Secondary | ICD-10-CM | POA: Diagnosis not present

## 2020-06-23 DIAGNOSIS — G43119 Migraine with aura, intractable, without status migrainosus: Secondary | ICD-10-CM | POA: Diagnosis not present

## 2020-06-23 DIAGNOSIS — E559 Vitamin D deficiency, unspecified: Secondary | ICD-10-CM | POA: Diagnosis not present

## 2020-06-23 DIAGNOSIS — G43109 Migraine with aura, not intractable, without status migrainosus: Secondary | ICD-10-CM | POA: Diagnosis not present

## 2020-06-23 DIAGNOSIS — M059 Rheumatoid arthritis with rheumatoid factor, unspecified: Secondary | ICD-10-CM | POA: Diagnosis not present

## 2020-06-23 DIAGNOSIS — R519 Headache, unspecified: Secondary | ICD-10-CM | POA: Diagnosis not present

## 2020-06-24 ENCOUNTER — Other Ambulatory Visit: Payer: Self-pay | Admitting: Neurology

## 2020-06-24 DIAGNOSIS — R404 Transient alteration of awareness: Secondary | ICD-10-CM

## 2020-06-24 DIAGNOSIS — M9903 Segmental and somatic dysfunction of lumbar region: Secondary | ICD-10-CM | POA: Diagnosis not present

## 2020-06-24 DIAGNOSIS — M5432 Sciatica, left side: Secondary | ICD-10-CM | POA: Diagnosis not present

## 2020-06-24 DIAGNOSIS — M9901 Segmental and somatic dysfunction of cervical region: Secondary | ICD-10-CM | POA: Diagnosis not present

## 2020-06-24 DIAGNOSIS — M5412 Radiculopathy, cervical region: Secondary | ICD-10-CM | POA: Diagnosis not present

## 2020-06-24 DIAGNOSIS — G8929 Other chronic pain: Secondary | ICD-10-CM

## 2020-06-30 DIAGNOSIS — Z79899 Other long term (current) drug therapy: Secondary | ICD-10-CM | POA: Diagnosis not present

## 2020-06-30 DIAGNOSIS — M059 Rheumatoid arthritis with rheumatoid factor, unspecified: Secondary | ICD-10-CM | POA: Diagnosis not present

## 2020-06-30 DIAGNOSIS — M48062 Spinal stenosis, lumbar region with neurogenic claudication: Secondary | ICD-10-CM | POA: Diagnosis not present

## 2020-07-09 DIAGNOSIS — M5412 Radiculopathy, cervical region: Secondary | ICD-10-CM | POA: Diagnosis not present

## 2020-07-09 DIAGNOSIS — M9901 Segmental and somatic dysfunction of cervical region: Secondary | ICD-10-CM | POA: Diagnosis not present

## 2020-07-09 DIAGNOSIS — M9903 Segmental and somatic dysfunction of lumbar region: Secondary | ICD-10-CM | POA: Diagnosis not present

## 2020-07-09 DIAGNOSIS — M5432 Sciatica, left side: Secondary | ICD-10-CM | POA: Diagnosis not present

## 2020-07-11 ENCOUNTER — Ambulatory Visit: Admission: RE | Admit: 2020-07-11 | Payer: Medicare Other | Source: Ambulatory Visit

## 2020-07-15 ENCOUNTER — Ambulatory Visit (INDEPENDENT_AMBULATORY_CARE_PROVIDER_SITE_OTHER): Payer: Medicare Other | Admitting: Family Medicine

## 2020-07-15 ENCOUNTER — Other Ambulatory Visit: Payer: Self-pay

## 2020-07-15 ENCOUNTER — Encounter: Payer: Self-pay | Admitting: Family Medicine

## 2020-07-15 VITALS — BP 132/72 | HR 98 | Temp 98.7°F | Wt 233.0 lb

## 2020-07-15 DIAGNOSIS — F419 Anxiety disorder, unspecified: Secondary | ICD-10-CM | POA: Diagnosis not present

## 2020-07-15 DIAGNOSIS — R102 Pelvic and perineal pain: Secondary | ICD-10-CM

## 2020-07-15 DIAGNOSIS — I129 Hypertensive chronic kidney disease with stage 1 through stage 4 chronic kidney disease, or unspecified chronic kidney disease: Secondary | ICD-10-CM

## 2020-07-15 LAB — WET PREP FOR TRICH, YEAST, CLUE
Clue Cell Exam: NEGATIVE
Trichomonas Exam: NEGATIVE
Yeast Exam: NEGATIVE

## 2020-07-15 MED ORDER — OMEPRAZOLE 40 MG PO CPDR: 40.0000 mg | DELAYED_RELEASE_CAPSULE | Freq: Every day | ORAL | 0 refills | Status: DC

## 2020-07-15 MED ORDER — LORAZEPAM 0.5 MG PO TABS: 0.5000 mg | ORAL_TABLET | Freq: Every day | ORAL | 0 refills | Status: DC | PRN

## 2020-07-15 MED ORDER — LISINOPRIL 20 MG PO TABS: 20.0000 mg | ORAL_TABLET | Freq: Every day | ORAL | 0 refills | Status: DC

## 2020-07-15 MED ORDER — SIMVASTATIN 40 MG PO TABS: 40.0000 mg | ORAL_TABLET | Freq: Every day | ORAL | 0 refills | Status: DC

## 2020-07-15 NOTE — Progress Notes (Signed)
BP 132/72 (BP Location: Left Arm, Cuff Size: Normal)   Pulse 98   Temp 98.7 F (37.1 C) (Oral)   Wt 233 lb (105.7 kg)   LMP 03/17/2013 (Approximate)   SpO2 97%   BMI 43.99 kg/m    Subjective:    Patient ID: Sierra Copeland, female    DOB: 1956-07-01, 64 y.o.   MRN: [id number]  HPI: Sierra Copeland is a 64 y.o. female  Chief Complaint  Patient presents with  . Hypertension   Needs to have an MRI for her head. Very anxious about it. Would like some meds to try to help with her anxiety before the MRI  HYPERTENSION Hypertension status: stable  Satisfied with current treatment? yes Duration of hypertension: chronic BP monitoring frequency:  not checking BP medication side effects:  no Medication compliance: excellent compliance Previous BP meds: lisinopril, amlodipine Aspirin: no Recurrent headaches: no Visual changes: no Palpitations: no Dyspnea: no Chest pain: no Lower extremity edema: no Dizzy/lightheaded: no  URINARY SYMPTOMS Duration: 2 weeks Dysuria: yes Urinary frequency: no Urgency: yes Small volume voids: yes Symptom severity: mild Urinary incontinence: no Foul odor: no Hematuria: no Abdominal pain: yes Back pain: no Suprapubic pain/pressure: yes Flank pain: no Fever:  no Vomiting: no Relief with cranberry juice: no Relief with pyridium: no Status: stable Previous urinary tract infection: yes Recurrent urinary tract infection: no Vaginal discharge: no Treatments attempted: increasing fluids  Relevant past medical, surgical, family and social history reviewed and updated as indicated. Interim medical history since our last visit reviewed. Allergies and medications reviewed and updated.  Review of Systems  Constitutional: Negative.   Respiratory: Negative.   Cardiovascular: Negative.   Gastrointestinal: Negative.   Genitourinary: Positive for dysuria, pelvic pain and vaginal pain. Negative for decreased urine volume, difficulty urinating,  dyspareunia, enuresis, flank pain, frequency, genital sores, hematuria, menstrual problem, urgency, vaginal bleeding and vaginal discharge.  Musculoskeletal: Negative.   Psychiatric/Behavioral: Negative.     Per HPI unless specifically indicated above     Objective:    BP 132/72 (BP Location: Left Arm, Cuff Size: Normal)   Pulse 98   Temp 98.7 F (37.1 C) (Oral)   Wt 233 lb (105.7 kg)   LMP 03/17/2013 (Approximate)   SpO2 97%   BMI 43.99 kg/m   Wt Readings from Last 3 Encounters:  07/15/20 233 lb (105.7 kg)  06/09/20 232 lb 3.2 oz (105.3 kg)  05/02/20 230 lb 12.8 oz (104.7 kg)    Physical Exam Vitals and nursing note reviewed.  Constitutional:      General: She is not in acute distress.    Appearance: Normal appearance. She is not ill-appearing, toxic-appearing or diaphoretic.  HENT:     Head: Normocephalic and atraumatic.     Right Ear: External ear normal.     Left Ear: External ear normal.     Nose: Nose normal.     Mouth/Throat:     Mouth: Mucous membranes are moist.     Pharynx: Oropharynx is clear.  Eyes:     General: No scleral icterus.       Right eye: No discharge.        Left eye: No discharge.     Extraocular Movements: Extraocular movements intact.     Conjunctiva/sclera: Conjunctivae normal.     Pupils: Pupils are equal, round, and reactive to light.  Cardiovascular:     Rate and Rhythm: Normal rate and regular rhythm.     Pulses: Normal pulses.  Heart sounds: Normal heart sounds. No murmur heard.  No friction rub. No gallop.   Pulmonary:     Effort: Pulmonary effort is normal. No respiratory distress.     Breath sounds: Normal breath sounds. No stridor. No wheezing, rhonchi or rales.  Chest:     Chest wall: No tenderness.  Musculoskeletal:        General: Normal range of motion.     Cervical back: Normal range of motion and neck supple.  Skin:    General: Skin is warm and dry.     Capillary Refill: Capillary refill takes less than 2 seconds.      Coloration: Skin is not jaundiced or pale.     Findings: No bruising, erythema, lesion or rash.  Neurological:     General: No focal deficit present.     Mental Status: She is alert and oriented to person, place, and time. Mental status is at baseline.  Psychiatric:        Mood and Affect: Mood normal.        Behavior: Behavior normal.        Thought Content: Thought content normal.        Judgment: Judgment normal.     Results for orders placed or performed in visit on 07/15/20  WET PREP FOR [place], YEAST, CLUE   Specimen: Vaginal; Sterile Swab   Sterile Swab  Result Value Ref Range   Trichomonas Exam Negative Negative   Yeast Exam Negative Negative   Clue Cell Exam Negative Negative  Microscopic Examination   Urine  Result Value Ref Range   WBC, UA 0-5 0 - 5 /hpf   RBC None seen 0 - 2 /hpf   Epithelial Cells (non renal) 0-10 0 - 10 /hpf   Bacteria, UA Moderate (A) None seen/Few  Urine Culture, Reflex   Urine  Result Value Ref Range   Urine Culture, Routine WILL FOLLOW   UA/M w/rflx Culture, Routine   Specimen: Urine   Urine  Result Value Ref Range   Specific Gravity, UA 1.025 1.005 - 1.030   pH, UA 5.5 5.0 - 7.5   Color, UA Yellow Yellow   Appearance Ur Clear Clear   Leukocytes,UA Negative Negative   Protein,UA Trace (A) Negative/Trace   Glucose, UA Negative Negative   Ketones, UA 1+ (A) Negative   RBC, UA Negative Negative   Bilirubin, UA Negative Negative   Urobilinogen, Ur 0.2 0.2 - 1.0 mg/dL   Nitrite, UA Negative Negative   Microscopic Examination See below:    Urinalysis Reflex Comment       Assessment & Plan:   Problem List Items Addressed This Visit      Genitourinary   Benign hypertensive renal disease - Primary    Under good control on current regimen. Continue current regimen. Continue to monitor. Call with any concerns. Refills given.         Other Visit Diagnoses    Vaginal pain       Wet prep and UA normal. Call if not getting  better by next week and we'll do vaginal exam.    Relevant Orders   WET PREP FOR [place], [place] (Completed)   UA/M w/rflx Culture, Routine (Completed)   Microscopic Examination (Completed)   Urine Culture, Reflex (Completed)   Acute anxiety       Lorazepam for MRI given. Call with any concerns.    Relevant Medications   LORazepam (ATIVAN) 0.5 MG tablet  Follow up plan: Return in about 6 months (around 01/15/2021).

## 2020-07-15 NOTE — Assessment & Plan Note (Signed)
Under good control on current regimen. Continue current regimen. Continue to monitor. Call with any concerns. Refills given.   

## 2020-07-18 LAB — UA/M W/RFLX CULTURE, ROUTINE
Bilirubin, UA: NEGATIVE
Glucose, UA: NEGATIVE
Leukocytes,UA: NEGATIVE
Nitrite, UA: NEGATIVE
RBC, UA: NEGATIVE
Specific Gravity, UA: 1.025 (ref 1.005–1.030)
Urobilinogen, Ur: 0.2 mg/dL (ref 0.2–1.0)
pH, UA: 5.5 (ref 5.0–7.5)

## 2020-07-18 LAB — MICROSCOPIC EXAMINATION: RBC, Urine: NONE SEEN /hpf (ref 0–2)

## 2020-07-18 LAB — URINE CULTURE, REFLEX

## 2020-07-21 DIAGNOSIS — M5030 Other cervical disc degeneration, unspecified cervical region: Secondary | ICD-10-CM | POA: Diagnosis not present

## 2020-07-21 DIAGNOSIS — M5136 Other intervertebral disc degeneration, lumbar region: Secondary | ICD-10-CM | POA: Diagnosis not present

## 2020-07-21 DIAGNOSIS — M5416 Radiculopathy, lumbar region: Secondary | ICD-10-CM | POA: Diagnosis not present

## 2020-07-21 DIAGNOSIS — M48062 Spinal stenosis, lumbar region with neurogenic claudication: Secondary | ICD-10-CM | POA: Diagnosis not present

## 2020-07-28 DIAGNOSIS — M5432 Sciatica, left side: Secondary | ICD-10-CM | POA: Diagnosis not present

## 2020-07-28 DIAGNOSIS — M5412 Radiculopathy, cervical region: Secondary | ICD-10-CM | POA: Diagnosis not present

## 2020-07-28 DIAGNOSIS — M9903 Segmental and somatic dysfunction of lumbar region: Secondary | ICD-10-CM | POA: Diagnosis not present

## 2020-07-28 DIAGNOSIS — M9901 Segmental and somatic dysfunction of cervical region: Secondary | ICD-10-CM | POA: Diagnosis not present

## 2020-07-29 ENCOUNTER — Other Ambulatory Visit: Payer: Self-pay

## 2020-07-29 ENCOUNTER — Ambulatory Visit
Admission: RE | Admit: 2020-07-29 | Discharge: 2020-07-29 | Disposition: A | Discharge status: Home / Self Care | Payer: Medicare Other | Source: Ambulatory Visit | Attending: Neurology | Admitting: Neurology

## 2020-07-29 DIAGNOSIS — I6782 Cerebral ischemia: Secondary | ICD-10-CM | POA: Insufficient documentation

## 2020-07-29 DIAGNOSIS — R519 Headache, unspecified: Secondary | ICD-10-CM | POA: Diagnosis not present

## 2020-07-29 DIAGNOSIS — G8929 Other chronic pain: Secondary | ICD-10-CM

## 2020-07-29 DIAGNOSIS — R404 Transient alteration of awareness: Secondary | ICD-10-CM

## 2020-07-29 MED ORDER — GADOBUTROL 1 MMOL/ML IV SOLN
10.0000 mL | Freq: Once | INTRAVENOUS | Status: AC | PRN
  Administered 2020-07-29: 10 mL via INTRAVENOUS

## 2020-08-11 DIAGNOSIS — C50412 Malignant neoplasm of upper-outer quadrant of left female breast: Secondary | ICD-10-CM | POA: Diagnosis not present

## 2020-08-11 DIAGNOSIS — Z171 Estrogen receptor negative status [ER-]: Secondary | ICD-10-CM | POA: Diagnosis not present

## 2020-08-18 DIAGNOSIS — M9903 Segmental and somatic dysfunction of lumbar region: Secondary | ICD-10-CM | POA: Diagnosis not present

## 2020-08-18 DIAGNOSIS — M9901 Segmental and somatic dysfunction of cervical region: Secondary | ICD-10-CM | POA: Diagnosis not present

## 2020-08-18 DIAGNOSIS — M5432 Sciatica, left side: Secondary | ICD-10-CM | POA: Diagnosis not present

## 2020-08-18 DIAGNOSIS — M5412 Radiculopathy, cervical region: Secondary | ICD-10-CM | POA: Diagnosis not present

## 2020-08-20 ENCOUNTER — Other Ambulatory Visit: Payer: Self-pay

## 2020-08-20 ENCOUNTER — Ambulatory Visit (INDEPENDENT_AMBULATORY_CARE_PROVIDER_SITE_OTHER): Payer: Medicare Other | Admitting: Family Medicine

## 2020-08-20 ENCOUNTER — Encounter: Payer: Self-pay | Admitting: Family Medicine

## 2020-08-20 VITALS — BP 130/81 | HR 84 | Temp 98.5°F | Wt 237.0 lb

## 2020-08-20 DIAGNOSIS — F419 Anxiety disorder, unspecified: Secondary | ICD-10-CM

## 2020-08-20 DIAGNOSIS — E1165 Type 2 diabetes mellitus with hyperglycemia: Secondary | ICD-10-CM

## 2020-08-20 DIAGNOSIS — F3342 Major depressive disorder, recurrent, in full remission: Secondary | ICD-10-CM | POA: Diagnosis not present

## 2020-08-20 LAB — BAYER DCA HB A1C WAIVED: HB A1C (BAYER DCA - WAIVED): 7.9 % — ABNORMAL HIGH (ref <7.0)

## 2020-08-20 MED ORDER — PAROXETINE HCL 40 MG PO TABS
60.0000 mg | ORAL_TABLET | Freq: Every day | ORAL | 1 refills | Status: DC
Start: 2020-08-20 — End: 2021-01-07

## 2020-08-20 MED ORDER — LORAZEPAM 0.5 MG PO TABS: 0.5000 mg | ORAL_TABLET | Freq: Every day | ORAL | 0 refills | Status: DC | PRN

## 2020-08-20 NOTE — Progress Notes (Signed)
BP 130/81   Pulse 84   Temp 98.5 F (36.9 C) (Oral)   Wt 237 lb (107.5 kg)   LMP 03/17/2013 (Approximate)   SpO2 98%   BMI 44.75 kg/m    Subjective:    Patient ID: Sierra Copeland, female    DOB: 1956-06-30, 64 y.o.   MRN: [id number]  HPI: Sierra Copeland is a 64 y.o. female  Chief Complaint  Patient presents with  . Anxiety   Needs to have a needle biopsy and has been really anxious about that.  ANXIETY/STRESS Duration: Past few days Status:exacerbated Anxious mood: yes  Excessive worrying: yes Irritability: yes  Sweating: no Nausea: no Palpitations:no Hyperventilation: no Panic attacks: no Agoraphobia: no  Obscessions/compulsions: no Depressed mood: no Depression screen [facility] 2/9 08/20/2020 06/09/2020 02/20/2020 08/31/2019 02/02/2019  Decreased Interest 0 0 0 0 0  Down, Depressed, Hopeless 0 0 0 0 0  PHQ - 2 Score 0 0 0 0 0  Altered sleeping 0 0 0 0 0  Tired, decreased energy 1 1 0 3 1  Change in appetite 0 0 0 0 1  Feeling bad or failure about yourself  0 0 0 0 0  Trouble concentrating 0 0 0 0 0  Moving slowly or fidgety/restless 0 0 0 0 0  Suicidal thoughts 0 0 0 0 0  PHQ-9 Score 1 1 0 3 2  Difficult doing work/chores Not difficult at all Not difficult at all Not difficult at all Not difficult at all Not difficult at all  Some recent data might be hidden   Anhedonia: no Weight changes: no Insomnia: no   Hypersomnia: no Fatigue/loss of energy: yes Feelings of worthlessness: no Feelings of guilt: no Impaired concentration/indecisiveness: no Suicidal ideations: no  Crying spells: no Recent Stressors/Life Changes: yes   Relationship problems: no   Family stress: no     Financial stress: no    Job stress: no    Recent death/loss: no  DIABETES- has been seeing endocrinology, but would like [place] to start taking over her DM management. Has been doing OK Hypoglycemic episodes:no Polydipsia/polyuria: no Visual disturbance: no Chest pain: no Paresthesias:  yes Glucose Monitoring: yes  Accucheck frequency: TID Taking Insulin?: yes Blood Pressure Monitoring: not checking Retinal Examination: Up to Date Foot Exam: Up to Date Diabetic Education: Completed Pneumovax: Up to Date Influenza: Up to Date Aspirin: yes   Relevant past medical, surgical, family and social history reviewed and updated as indicated. Interim medical history since our last visit reviewed. Allergies and medications reviewed and updated.  Review of Systems  Constitutional: Negative.   HENT: Negative.   Respiratory: Negative.   Cardiovascular: Negative.   Gastrointestinal: Negative.   Musculoskeletal: Negative.   Skin: Negative.   Psychiatric/Behavioral: Negative for agitation, behavioral problems, confusion, decreased concentration, dysphoric mood, hallucinations, self-injury, sleep disturbance and suicidal ideas. The patient is nervous/anxious. The patient is not hyperactive.     Per HPI unless specifically indicated above     Objective:    BP 130/81   Pulse 84   Temp 98.5 F (36.9 C) (Oral)   Wt 237 lb (107.5 kg)   LMP 03/17/2013 (Approximate)   SpO2 98%   BMI 44.75 kg/m   Wt Readings from Last 3 Encounters:  08/20/20 237 lb (107.5 kg)  07/15/20 233 lb (105.7 kg)  06/09/20 232 lb 3.2 oz (105.3 kg)    Physical Exam Vitals and nursing note reviewed.  Constitutional:      General: She is  not in acute distress.    Appearance: Normal appearance. She is not ill-appearing, toxic-appearing or diaphoretic.  HENT:     Head: Normocephalic and atraumatic.     Right Ear: External ear normal.     Left Ear: External ear normal.     Nose: Nose normal.     Mouth/Throat:     Mouth: Mucous membranes are moist.     Pharynx: Oropharynx is clear.  Eyes:     General: No scleral icterus.       Right eye: No discharge.        Left eye: No discharge.     Extraocular Movements: Extraocular movements intact.     Conjunctiva/sclera: Conjunctivae normal.     Pupils:  Pupils are equal, round, and reactive to light.  Cardiovascular:     Rate and Rhythm: Normal rate and regular rhythm.     Pulses: Normal pulses.     Heart sounds: Normal heart sounds. No murmur heard.  No friction rub. No gallop.   Pulmonary:     Effort: Pulmonary effort is normal. No respiratory distress.     Breath sounds: Normal breath sounds. No stridor. No wheezing, rhonchi or rales.  Chest:     Chest wall: No tenderness.  Musculoskeletal:        General: Normal range of motion.     Cervical back: Normal range of motion and neck supple.  Skin:    General: Skin is warm and dry.     Capillary Refill: Capillary refill takes less than 2 seconds.     Coloration: Skin is not jaundiced or pale.     Findings: No bruising, erythema, lesion or rash.  Neurological:     General: No focal deficit present.     Mental Status: She is alert and oriented to person, place, and time. Mental status is at baseline.  Psychiatric:        Mood and Affect: Mood normal.        Behavior: Behavior normal.        Thought Content: Thought content normal.        Judgment: Judgment normal.     Results for orders placed or performed in visit on 08/20/20  Bayer DCA Hb A1c Waived  Result Value Ref Range   HB A1C (BAYER DCA - WAIVED) 7.9 (H) <7.0 %      Assessment & Plan:   Problem List Items Addressed This Visit      Endocrine   Type II diabetes mellitus, uncontrolled ([place]) - Primary    Improved with A1c of 7.9 down from 8.7. Will really work on her diet and will start trulicity 2.99- she will let us know how she tolerates it. If she does, we will start it. Call with any concerns.       Relevant Orders   Bayer DCA Hb A1c Waived (Completed)     Other   Recurrent major depressive disorder, in full remission ([place])    Continue paxil. Call with any concerns. Continue to monitor.       Relevant Medications   PARoxetine (PAXIL) 40 MG tablet   LORazepam (ATIVAN) 0.5 MG tablet   Acute anxiety     Will start lorazepam PRN prior to biopsy. Follow up after biopsy. Continue to monitor. Conitnue paxil.       Relevant Medications   PARoxetine (PAXIL) 40 MG tablet   LORazepam (ATIVAN) 0.5 MG tablet       Follow up plan: Return in about  4 weeks (around 09/17/2020).

## 2020-08-23 NOTE — Assessment & Plan Note (Signed)
Will start lorazepam PRN prior to biopsy. Follow up after biopsy. Continue to monitor. Conitnue paxil.

## 2020-08-23 NOTE — Assessment & Plan Note (Signed)
Continue paxil. Call with any concerns. Continue to monitor.

## 2020-08-23 NOTE — Assessment & Plan Note (Signed)
Improved with A1c of 7.9 down from 8.7. Will really work on her diet and will start trulicity 3.95- she will let us know how she tolerates it. If she does, we will start it. Call with any concerns.

## 2020-08-26 DIAGNOSIS — R928 Other abnormal and inconclusive findings on diagnostic imaging of breast: Secondary | ICD-10-CM | POA: Diagnosis not present

## 2020-08-26 DIAGNOSIS — Z9289 Personal history of other medical treatment: Secondary | ICD-10-CM | POA: Diagnosis not present

## 2020-08-26 DIAGNOSIS — Z9012 Acquired absence of left breast and nipple: Secondary | ICD-10-CM | POA: Diagnosis not present

## 2020-08-26 DIAGNOSIS — Z800 Family history of malignant neoplasm of digestive organs: Secondary | ICD-10-CM | POA: Diagnosis not present

## 2020-08-26 DIAGNOSIS — E7800 Pure hypercholesterolemia, unspecified: Secondary | ICD-10-CM | POA: Diagnosis not present

## 2020-08-26 DIAGNOSIS — Z803 Family history of malignant neoplasm of breast: Secondary | ICD-10-CM | POA: Diagnosis not present

## 2020-08-26 DIAGNOSIS — I10 Essential (primary) hypertension: Secondary | ICD-10-CM | POA: Diagnosis not present

## 2020-08-26 DIAGNOSIS — Z171 Estrogen receptor negative status [ER-]: Secondary | ICD-10-CM | POA: Diagnosis not present

## 2020-08-26 DIAGNOSIS — E119 Type 2 diabetes mellitus without complications: Secondary | ICD-10-CM | POA: Diagnosis not present

## 2020-08-26 DIAGNOSIS — C50412 Malignant neoplasm of upper-outer quadrant of left female breast: Secondary | ICD-10-CM | POA: Diagnosis not present

## 2020-08-27 ENCOUNTER — Telehealth: Payer: Self-pay

## 2020-08-27 NOTE — Telephone Encounter (Signed)
Sample signed out. Called and let patient know that it was ready to be picked up.

## 2020-08-27 NOTE — Telephone Encounter (Signed)
-----   Message from Valerie Roys, [place] sent at 08/23/2020  9:12 PM EDT ----- Please give her 1 pack of trulicity 6.37 thanks.

## 2020-09-08 DIAGNOSIS — M9903 Segmental and somatic dysfunction of lumbar region: Secondary | ICD-10-CM | POA: Diagnosis not present

## 2020-09-08 DIAGNOSIS — M5412 Radiculopathy, cervical region: Secondary | ICD-10-CM | POA: Diagnosis not present

## 2020-09-08 DIAGNOSIS — M9901 Segmental and somatic dysfunction of cervical region: Secondary | ICD-10-CM | POA: Diagnosis not present

## 2020-09-08 DIAGNOSIS — M5432 Sciatica, left side: Secondary | ICD-10-CM | POA: Diagnosis not present

## 2020-09-23 ENCOUNTER — Other Ambulatory Visit: Payer: Self-pay

## 2020-09-23 ENCOUNTER — Encounter: Payer: Self-pay | Admitting: Family Medicine

## 2020-09-23 ENCOUNTER — Ambulatory Visit (INDEPENDENT_AMBULATORY_CARE_PROVIDER_SITE_OTHER): Payer: Medicare Other | Admitting: Family Medicine

## 2020-09-23 VITALS — BP 128/84 | HR 101 | Temp 98.4°F | Ht 61.02 in | Wt 234.0 lb

## 2020-09-23 DIAGNOSIS — F3342 Major depressive disorder, recurrent, in full remission: Secondary | ICD-10-CM

## 2020-09-23 DIAGNOSIS — E1165 Type 2 diabetes mellitus with hyperglycemia: Secondary | ICD-10-CM | POA: Diagnosis not present

## 2020-09-23 DIAGNOSIS — C50912 Malignant neoplasm of unspecified site of left female breast: Secondary | ICD-10-CM | POA: Diagnosis not present

## 2020-09-23 DIAGNOSIS — Z23 Encounter for immunization: Secondary | ICD-10-CM

## 2020-09-23 MED ORDER — TRULICITY 1.5 MG/0.5ML ~~LOC~~ SOAJ: 1.5000 mg | SUBCUTANEOUS | 1 refills | Status: DC

## 2020-09-23 NOTE — Progress Notes (Signed)
BP 128/84   Pulse (!) 101   Temp 98.4 F (36.9 C) (Oral)   Ht 5' 1.02" (1.55 m)   Wt 234 lb (106.1 kg)   LMP 03/17/2013 (Approximate)   SpO2 96%   BMI 44.18 kg/m    Subjective:    Patient ID: Sierra Copeland, female    DOB: [birth year], 64 y.o.   MRN: [id number]  HPI: Sierra Copeland is a 64 y.o. female  Chief Complaint  Patient presents with  . Diabetes    Follow up  . Depression    Follow up   DEPRESSION Mood status: controlled Satisfied with current treatment?: yes Symptom severity: mild  Duration of current treatment : chronic Side effects: no Medication compliance: excellent compliance Psychotherapy/counseling: no  Previous psychiatric medications: paxil Depressed mood: no Anxious mood: yes Anhedonia: no Significant weight loss or gain: no Insomnia: no  Fatigue: no Feelings of worthlessness or guilt: no Impaired concentration/indecisiveness: no Suicidal ideations: no Hopelessness: no Crying spells: no Depression screen [facility] 2/9 09/23/2020 08/20/2020 06/09/2020 02/20/2020 08/31/2019  Decreased Interest 0 0 0 0 0  Down, Depressed, Hopeless 0 0 0 0 0  PHQ - 2 Score 0 0 0 0 0  Altered sleeping 0 0 0 0 0  Tired, decreased energy 0 1 1 0 3  Change in appetite 0 0 0 0 0  Feeling bad or failure about yourself  0 0 0 0 0  Trouble concentrating 0 0 0 0 0  Moving slowly or fidgety/restless 0 0 0 0 0  Suicidal thoughts 0 0 0 0 0  PHQ-9 Score 0 1 1 0 3  Difficult doing work/chores Not difficult at all Not difficult at all Not difficult at all Not difficult at all Not difficult at all  Some recent data might be hidden   GAD 7 : Generalized Anxiety Score 09/23/2020 08/20/2020 02/02/2019 01/31/2018  Nervous, Anxious, on Edge 0 2 0 1  Control/stop worrying 0 1 0 0  Worry too much - different things 0 1 0 0  Trouble relaxing 0 1 0 0  Restless 0 0 0 0  Easily annoyed or irritable 0 2 0 1  Afraid - awful might happen 0 0 0 0  Total GAD 7 Score 0 7 0 2  Anxiety Difficulty Not  difficult at all Not difficult at all Not difficult at all -    DIABETES- just started the trulicity about 2 weeks ago.  Hypoglycemic episodes:no Polydipsia/polyuria: no Visual disturbance: no Chest pain: no Paresthesias: no Glucose Monitoring: yes  Accucheck frequency: Daily  Fasting glucose: 220 Taking Insulin?: yes Blood Pressure Monitoring: not checking Retinal Examination: Up to Date Foot Exam: Up to Date Diabetic Education: Completed Pneumovax: Up to Date Influenza: Up to Date Aspirin: yes  Relevant past medical, surgical, family and social history reviewed and updated as indicated. Interim medical history since our last visit reviewed. Allergies and medications reviewed and updated.  Review of Systems  Constitutional: Negative.   Respiratory: Negative.   Cardiovascular: Negative.   Musculoskeletal: Negative.   Psychiatric/Behavioral: Negative.     Per HPI unless specifically indicated above     Objective:    BP 128/84   Pulse (!) 101   Temp 98.4 F (36.9 C) (Oral)   Ht 5' 1.02" (1.55 m)   Wt 234 lb (106.1 kg)   LMP 03/17/2013 (Approximate)   SpO2 96%   BMI 44.18 kg/m   Wt Readings from Last 3 Encounters:  09/23/20 234 lb (  106.1 kg)  08/20/20 237 lb (107.5 kg)  07/15/20 233 lb (105.7 kg)    Physical Exam Vitals and nursing note reviewed.  Constitutional:      General: She is not in acute distress.    Appearance: Normal appearance. She is not ill-appearing, toxic-appearing or diaphoretic.  HENT:     Head: Normocephalic and atraumatic.     Right Ear: External ear normal.     Left Ear: External ear normal.     Nose: Nose normal.     Mouth/Throat:     Mouth: Mucous membranes are moist.     Pharynx: Oropharynx is clear.  Eyes:     General: No scleral icterus.       Right eye: No discharge.        Left eye: No discharge.     Extraocular Movements: Extraocular movements intact.     Conjunctiva/sclera: Conjunctivae normal.     Pupils: Pupils are  equal, round, and reactive to light.  Cardiovascular:     Rate and Rhythm: Normal rate and regular rhythm.     Pulses: Normal pulses.     Heart sounds: Normal heart sounds. No murmur heard.  No friction rub. No gallop.   Pulmonary:     Effort: Pulmonary effort is normal. No respiratory distress.     Breath sounds: Normal breath sounds. No stridor. No wheezing, rhonchi or rales.  Chest:     Chest wall: No tenderness.  Musculoskeletal:        General: Normal range of motion.     Cervical back: Normal range of motion and neck supple.  Skin:    General: Skin is warm and dry.     Capillary Refill: Capillary refill takes less than 2 seconds.     Coloration: Skin is not jaundiced or pale.     Findings: No bruising, erythema, lesion or rash.  Neurological:     General: No focal deficit present.     Mental Status: She is alert and oriented to person, place, and time. Mental status is at baseline.  Psychiatric:        Mood and Affect: Mood normal.        Behavior: Behavior normal.        Thought Content: Thought content normal.        Judgment: Judgment normal.     Results for orders placed or performed in visit on 08/20/20  Bayer DCA Hb A1c Waived  Result Value Ref Range   HB A1C (BAYER DCA - WAIVED) 7.9 (H) <7.0 %      Assessment & Plan:   Problem List Items Addressed This Visit      Endocrine   Type II diabetes mellitus, uncontrolled ([place]) - Primary    Tolerating the trulicity well. Continue trulicity 4.49 for the next 2 weeks, then increase to 1.5mg  weekly- continue current regimen. Recheck 2 months. Call with any concerns.       Relevant Medications   Dulaglutide (TRULICITY) 1.5 QP/5.9FM SOPN     Other   Morbid obesity ([facility])    Encouraged diet and exercise with goal of losing 1-2 lbs per week. Continue to monitor.       Relevant Medications   Dulaglutide (TRULICITY) 1.5 BW/4.6KZ SOPN   Invasive ductal carcinoma of breast, female, left ([place])    Just had bx. Continue  to follow with oncology. Continue to monitor. Call with any concerns.       Recurrent major depressive disorder, in full remission ([place])  Under good control on current regimen. Continue current regimen. Continue to monitor. Call with any concerns. Refills given.         Other Visit Diagnoses    Need for immunization against influenza       Flu shot given today.   Relevant Orders   Flu Vaccine QUAD 36+ mos IM (Completed)       Follow up plan: Return in about 2 months (around 11/23/2020).

## 2020-09-23 NOTE — Assessment & Plan Note (Signed)
Tolerating the trulicity well. Continue trulicity 3.78 for the next 2 weeks, then increase to 1.5mg  weekly- continue current regimen. Recheck 2 months. Call with any concerns.

## 2020-09-23 NOTE — Assessment & Plan Note (Signed)
Under good control on current regimen. Continue current regimen. Continue to monitor. Call with any concerns. Refills given.   

## 2020-09-23 NOTE — Assessment & Plan Note (Signed)
Just had bx. Continue to follow with oncology. Continue to monitor. Call with any concerns.

## 2020-09-23 NOTE — Assessment & Plan Note (Signed)
Encouraged diet and exercise with goal of losing 1-2 lbs per week. Continue to monitor.  

## 2020-10-07 DIAGNOSIS — M9903 Segmental and somatic dysfunction of lumbar region: Secondary | ICD-10-CM | POA: Diagnosis not present

## 2020-10-07 DIAGNOSIS — M5412 Radiculopathy, cervical region: Secondary | ICD-10-CM | POA: Diagnosis not present

## 2020-10-07 DIAGNOSIS — M9901 Segmental and somatic dysfunction of cervical region: Secondary | ICD-10-CM | POA: Diagnosis not present

## 2020-10-07 DIAGNOSIS — M5432 Sciatica, left side: Secondary | ICD-10-CM | POA: Diagnosis not present

## 2020-10-13 DIAGNOSIS — G43119 Migraine with aura, intractable, without status migrainosus: Secondary | ICD-10-CM | POA: Diagnosis not present

## 2020-10-13 DIAGNOSIS — H8113 Benign paroxysmal vertigo, bilateral: Secondary | ICD-10-CM | POA: Diagnosis not present

## 2020-10-13 DIAGNOSIS — M5030 Other cervical disc degeneration, unspecified cervical region: Secondary | ICD-10-CM | POA: Diagnosis not present

## 2020-10-13 DIAGNOSIS — G43109 Migraine with aura, not intractable, without status migrainosus: Secondary | ICD-10-CM | POA: Diagnosis not present

## 2020-10-13 DIAGNOSIS — M5416 Radiculopathy, lumbar region: Secondary | ICD-10-CM | POA: Diagnosis not present

## 2020-10-13 DIAGNOSIS — M059 Rheumatoid arthritis with rheumatoid factor, unspecified: Secondary | ICD-10-CM | POA: Diagnosis not present

## 2020-10-13 DIAGNOSIS — R519 Headache, unspecified: Secondary | ICD-10-CM | POA: Diagnosis not present

## 2020-10-28 DIAGNOSIS — M9903 Segmental and somatic dysfunction of lumbar region: Secondary | ICD-10-CM | POA: Diagnosis not present

## 2020-10-28 DIAGNOSIS — M5432 Sciatica, left side: Secondary | ICD-10-CM | POA: Diagnosis not present

## 2020-10-28 DIAGNOSIS — M5412 Radiculopathy, cervical region: Secondary | ICD-10-CM | POA: Diagnosis not present

## 2020-10-28 DIAGNOSIS — M9901 Segmental and somatic dysfunction of cervical region: Secondary | ICD-10-CM | POA: Diagnosis not present

## 2020-11-11 DIAGNOSIS — M5432 Sciatica, left side: Secondary | ICD-10-CM | POA: Diagnosis not present

## 2020-11-11 DIAGNOSIS — M5412 Radiculopathy, cervical region: Secondary | ICD-10-CM | POA: Diagnosis not present

## 2020-11-11 DIAGNOSIS — M9903 Segmental and somatic dysfunction of lumbar region: Secondary | ICD-10-CM | POA: Diagnosis not present

## 2020-11-11 DIAGNOSIS — M9901 Segmental and somatic dysfunction of cervical region: Secondary | ICD-10-CM | POA: Diagnosis not present

## 2020-11-24 ENCOUNTER — Other Ambulatory Visit: Payer: Self-pay

## 2020-11-24 ENCOUNTER — Ambulatory Visit (INDEPENDENT_AMBULATORY_CARE_PROVIDER_SITE_OTHER): Payer: Medicare Other | Admitting: Family Medicine

## 2020-11-24 ENCOUNTER — Telehealth: Payer: Self-pay

## 2020-11-24 ENCOUNTER — Encounter: Payer: Self-pay | Admitting: Family Medicine

## 2020-11-24 VITALS — BP 128/77 | HR 91 | Temp 99.0°F

## 2020-11-24 DIAGNOSIS — H538 Other visual disturbances: Secondary | ICD-10-CM | POA: Diagnosis not present

## 2020-11-24 DIAGNOSIS — I129 Hypertensive chronic kidney disease with stage 1 through stage 4 chronic kidney disease, or unspecified chronic kidney disease: Secondary | ICD-10-CM

## 2020-11-24 DIAGNOSIS — E1165 Type 2 diabetes mellitus with hyperglycemia: Secondary | ICD-10-CM

## 2020-11-24 DIAGNOSIS — Z1211 Encounter for screening for malignant neoplasm of colon: Secondary | ICD-10-CM

## 2020-11-24 DIAGNOSIS — H5319 Other subjective visual disturbances: Secondary | ICD-10-CM | POA: Diagnosis not present

## 2020-11-24 DIAGNOSIS — E7800 Pure hypercholesterolemia, unspecified: Secondary | ICD-10-CM

## 2020-11-24 DIAGNOSIS — E113293 Type 2 diabetes mellitus with mild nonproliferative diabetic retinopathy without macular edema, bilateral: Secondary | ICD-10-CM | POA: Diagnosis not present

## 2020-11-24 DIAGNOSIS — H539 Unspecified visual disturbance: Secondary | ICD-10-CM

## 2020-11-24 LAB — BAYER DCA HB A1C WAIVED: HB A1C (BAYER DCA - WAIVED): 8.4 % — ABNORMAL HIGH (ref <7.0)

## 2020-11-24 MED ORDER — AMLODIPINE BESYLATE 2.5 MG PO TABS
2.5000 mg | ORAL_TABLET | Freq: Every day | ORAL | 1 refills | Status: DC
Start: 2020-11-24 — End: 2021-02-24

## 2020-11-24 MED ORDER — INSULIN LISPRO (1 UNIT DIAL) 100 UNIT/ML (KWIKPEN): 10.0000 [IU] | PEN_INJECTOR | Freq: Three times a day (TID) | SUBCUTANEOUS | 1 refills | Status: DC

## 2020-11-24 MED ORDER — LISINOPRIL 20 MG PO TABS
20.0000 mg | ORAL_TABLET | Freq: Every day | ORAL | 1 refills | Status: DC
Start: 2020-11-24 — End: 2021-05-08

## 2020-11-24 MED ORDER — TRULICITY 3 MG/0.5ML ~~LOC~~ SOAJ: 3.0000 mg | SUBCUTANEOUS | 3 refills | Status: DC

## 2020-11-24 MED ORDER — INSULIN ASPART 100 UNIT/ML FLEXPEN: 10.0000 [IU] | PEN_INJECTOR | Freq: Three times a day (TID) | SUBCUTANEOUS | 1 refills | Status: DC

## 2020-11-24 MED ORDER — OMEPRAZOLE 40 MG PO CPDR
40.0000 mg | DELAYED_RELEASE_CAPSULE | Freq: Every day | ORAL | 1 refills | Status: DC
Start: 2020-11-24 — End: 2021-05-20

## 2020-11-24 MED ORDER — METFORMIN HCL 1000 MG PO TABS
1000.0000 mg | ORAL_TABLET | Freq: Two times a day (BID) | ORAL | 1 refills | Status: DC
Start: 2020-11-24 — End: 2021-02-24

## 2020-11-24 MED ORDER — GABAPENTIN 100 MG PO CAPS
100.0000 mg | ORAL_CAPSULE | Freq: Two times a day (BID) | ORAL | 1 refills | Status: DC
Start: 2020-11-24 — End: 2021-05-28

## 2020-11-24 MED ORDER — SIMVASTATIN 40 MG PO TABS
40.0000 mg | ORAL_TABLET | Freq: Every day | ORAL | 1 refills | Status: DC
Start: 2020-11-24 — End: 2021-05-08

## 2020-11-24 NOTE — Assessment & Plan Note (Signed)
Under good control on current regimen. Continue current regimen. Continue to monitor. Call with any concerns. Refills given. Labs drawn today.   

## 2020-11-24 NOTE — Telephone Encounter (Signed)
fyi

## 2020-11-24 NOTE — Assessment & Plan Note (Addendum)
Going in the wrong direction with A1c of 8.4. Up from 7.9- will increase trulicity to 3mg  and recheck 3 months. Continue other meds.

## 2020-11-24 NOTE — Progress Notes (Signed)
BP 128/77   Pulse 91   Temp 99 F (37.2 C) (Oral)   LMP 03/17/2013 (Approximate)   SpO2 97%    Subjective:    Patient ID: Sierra Copeland, female    DOB: 12/03/1955, 64 y.o.   MRN: [id number]  HPI: Sierra Copeland is a 64 y.o. female  Chief Complaint  Patient presents with  . Hyperlipidemia  . Diabetes  . Hypertension   Noted that yesterday she was seeing flashing lights in her vision and then since then she has a persistent spot in her vision that she cannot see. She has not called her eye doctor.   DIABETES Hypoglycemic episodes:no Polydipsia/polyuria: no Visual disturbance: yes Chest pain: no Paresthesias: no Glucose Monitoring: yes  Accucheck frequency: Daily  Fasting glucose: 108 Taking Insulin?: no Blood Pressure Monitoring: not checking Retinal Examination: Up to Date Foot Exam: Up to Date Diabetic Education: Completed Pneumovax: Up to Date Influenza: Up to Date Aspirin: yes  HYPERTENSION / HYPERLIPIDEMIA Satisfied with current treatment? yes Duration of hypertension: chronic BP monitoring frequency: not checking BP medication side effects: no Past BP meds: lisinopril, amlodpine Duration of hyperlipidemia: chronic Cholesterol medication side effects: no Cholesterol supplements: none Past cholesterol medications: simvastatin Medication compliance: excellent compliance Aspirin: yes Recent stressors: no Recurrent headaches: no Visual changes: yes Palpitations: no Dyspnea: no Chest pain: no Lower extremity edema: no Dizzy/lightheaded: yes  Relevant past medical, surgical, family and social history reviewed and updated as indicated. Interim medical history since our last visit reviewed. Allergies and medications reviewed and updated.  Review of Systems  Constitutional: Negative.   Eyes: Positive for visual disturbance. Negative for photophobia, pain, discharge, redness and itching.       Flashing light- yesterday, then spot in her vision today   Respiratory: Negative.   Cardiovascular: Negative.   Gastrointestinal: Negative.   Musculoskeletal: Negative.   Psychiatric/Behavioral: Negative.     Per HPI unless specifically indicated above     Objective:    BP 128/77   Pulse 91   Temp 99 F (37.2 C) (Oral)   LMP 03/17/2013 (Approximate)   SpO2 97%   Wt Readings from Last 3 Encounters:  09/23/20 234 lb (106.1 kg)  08/20/20 237 lb (107.5 kg)  07/15/20 233 lb (105.7 kg)    Physical Exam Vitals and nursing note reviewed.  Constitutional:      General: She is not in acute distress.    Appearance: Normal appearance. She is not ill-appearing, toxic-appearing or diaphoretic.  HENT:     Head: Normocephalic and atraumatic.     Right Ear: External ear normal.     Left Ear: External ear normal.     Nose: Nose normal.     Mouth/Throat:     Mouth: Mucous membranes are moist.     Pharynx: Oropharynx is clear.  Eyes:     General: No scleral icterus.       Right eye: No discharge.        Left eye: No discharge.     Extraocular Movements: Extraocular movements intact.     Conjunctiva/sclera: Conjunctivae normal.     Pupils: Pupils are equal, round, and reactive to light.  Cardiovascular:     Rate and Rhythm: Normal rate and regular rhythm.     Pulses: Normal pulses.     Heart sounds: Normal heart sounds. No murmur heard. No friction rub. No gallop.   Pulmonary:     Effort: Pulmonary effort is normal. No respiratory distress.  Breath sounds: Normal breath sounds. No stridor. No wheezing, rhonchi or rales.  Chest:     Chest wall: No tenderness.  Musculoskeletal:        General: Normal range of motion.     Cervical back: Normal range of motion and neck supple.  Skin:    General: Skin is warm and dry.     Capillary Refill: Capillary refill takes less than 2 seconds.     Coloration: Skin is not jaundiced or pale.     Findings: No bruising, erythema, lesion or rash.  Neurological:     General: No focal deficit present.      Mental Status: She is alert and oriented to person, place, and time. Mental status is at baseline.  Psychiatric:        Mood and Affect: Mood normal.        Behavior: Behavior normal.        Thought Content: Thought content normal.        Judgment: Judgment normal.     Results for orders placed or performed in visit on 08/20/20  Bayer DCA Hb A1c Waived  Result Value Ref Range   HB A1C (BAYER DCA - WAIVED) 7.9 (H) <7.0 %      Assessment & Plan:   Problem List Items Addressed This Visit      Endocrine   Type II diabetes mellitus, uncontrolled ([place]) - Primary    Going in the wrong direction with A1c of 8.4. Up from 7.9- will increase trulicity to 3mg  and recheck 3 months. Continue other meds.       Relevant Medications   Dulaglutide (TRULICITY) 3 0000000 SOPN   simvastatin (ZOCOR) 40 MG tablet   lisinopril (ZESTRIL) 20 MG tablet   insulin aspart (NOVOLOG) 100 UNIT/ML FlexPen   metFORMIN (GLUCOPHAGE) 1000 MG tablet   Other Relevant Orders   Comprehensive metabolic panel   Bayer DCA Hb A1c Waived     Genitourinary   Benign hypertensive renal disease    Under good control on current regimen. Continue current regimen. Continue to monitor. Call with any concerns. Refills given. Labs drawn today.       Relevant Orders   Comprehensive metabolic panel     Other   Hypercholesteremia    Under good control on current regimen. Continue current regimen. Continue to monitor. Call with any concerns. Refills given. Labs drawn today.       Relevant Medications   simvastatin (ZOCOR) 40 MG tablet   lisinopril (ZESTRIL) 20 MG tablet   amLODipine (NORVASC) 2.5 MG tablet   Other Relevant Orders   Comprehensive metabolic panel   Lipid Panel w/o Chol/HDL Ratio    Other Visit Diagnoses    Flashing lights seen       Concern for retinal detatchment. Urgent referral to opthalmology placed today.   Relevant Orders   Ambulatory referral to Ophthalmology   Visual changes        Concern for retinal detatchment. Urgent referral to opthalmology placed today.   Relevant Orders   Ambulatory referral to Ophthalmology   Screening for colon cancer       Referral to GI placed today.   Relevant Orders   Ambulatory referral to Gastroenterology       Follow up plan: Return in about 3 months (around 02/22/2021).

## 2020-11-24 NOTE — Telephone Encounter (Signed)
Copied from [place] [id number]. Topic: General - Inquiry >> Nov 24, 2020  3:26 PM Greggory Keen D wrote: Reason for CRM: Leafy Ro from [facility] called to say pt's ins only covers Humalog.

## 2020-11-25 ENCOUNTER — Other Ambulatory Visit: Payer: Self-pay | Admitting: Family Medicine

## 2020-11-25 DIAGNOSIS — E875 Hyperkalemia: Secondary | ICD-10-CM

## 2020-11-25 LAB — COMPREHENSIVE METABOLIC PANEL
ALT: 27 IU/L (ref 0–32)
AST: 25 IU/L (ref 0–40)
Albumin/Globulin Ratio: 1.6 (ref 1.2–2.2)
Albumin: 4.1 g/dL (ref 3.8–4.8)
Alkaline Phosphatase: 41 IU/L — ABNORMAL LOW (ref 44–121)
BUN/Creatinine Ratio: 16 (ref 12–28)
BUN: 14 mg/dL (ref 8–27)
Bilirubin Total: 0.2 mg/dL (ref 0.0–1.2)
CO2: 20 mmol/L (ref 20–29)
Calcium: 9.5 mg/dL (ref 8.7–10.3)
Chloride: 96 mmol/L (ref 96–106)
Creatinine, Ser: 0.9 mg/dL (ref 0.57–1.00)
GFR calc Af Amer: 79 mL/min/{1.73_m2} (ref >59)
GFR calc non Af Amer: 68 mL/min/{1.73_m2} (ref >59)
Globulin, Total: 2.5 g/dL (ref 1.5–4.5)
Glucose: 197 mg/dL — ABNORMAL HIGH (ref 65–99)
Potassium: 5.4 mmol/L — ABNORMAL HIGH (ref 3.5–5.2)
Sodium: 135 mmol/L (ref 134–144)
Total Protein: 6.6 g/dL (ref 6.0–8.5)

## 2020-11-25 LAB — LIPID PANEL W/O CHOL/HDL RATIO
Cholesterol, Total: 143 mg/dL (ref 100–199)
HDL: 48 mg/dL (ref >39)
LDL Chol Calc (NIH): 62 mg/dL (ref 0–99)
Triglycerides: 199 mg/dL — ABNORMAL HIGH (ref 0–149)
VLDL Cholesterol Cal: 33 mg/dL (ref 5–40)

## 2020-12-01 ENCOUNTER — Other Ambulatory Visit: Payer: Self-pay

## 2020-12-01 ENCOUNTER — Other Ambulatory Visit: Payer: Medicare Other

## 2020-12-01 DIAGNOSIS — E875 Hyperkalemia: Secondary | ICD-10-CM

## 2020-12-02 DIAGNOSIS — M9901 Segmental and somatic dysfunction of cervical region: Secondary | ICD-10-CM | POA: Diagnosis not present

## 2020-12-02 DIAGNOSIS — M9903 Segmental and somatic dysfunction of lumbar region: Secondary | ICD-10-CM | POA: Diagnosis not present

## 2020-12-02 DIAGNOSIS — M5432 Sciatica, left side: Secondary | ICD-10-CM | POA: Diagnosis not present

## 2020-12-02 DIAGNOSIS — M5412 Radiculopathy, cervical region: Secondary | ICD-10-CM | POA: Diagnosis not present

## 2020-12-02 LAB — BASIC METABOLIC PANEL
BUN/Creatinine Ratio: 17 (ref 12–28)
BUN: 17 mg/dL (ref 8–27)
CO2: 21 mmol/L (ref 20–29)
Calcium: 9.8 mg/dL (ref 8.7–10.3)
Chloride: 94 mmol/L — ABNORMAL LOW (ref 96–106)
Creatinine, Ser: 0.99 mg/dL (ref 0.57–1.00)
GFR calc Af Amer: 70 mL/min/{1.73_m2} (ref >59)
GFR calc non Af Amer: 61 mL/min/{1.73_m2} (ref >59)
Glucose: 179 mg/dL — ABNORMAL HIGH (ref 65–99)
Potassium: 4.7 mmol/L (ref 3.5–5.2)
Sodium: 136 mmol/L (ref 134–144)

## 2020-12-23 DIAGNOSIS — M5412 Radiculopathy, cervical region: Secondary | ICD-10-CM | POA: Diagnosis not present

## 2020-12-23 DIAGNOSIS — M5432 Sciatica, left side: Secondary | ICD-10-CM | POA: Diagnosis not present

## 2020-12-23 DIAGNOSIS — M8949 Other hypertrophic osteoarthropathy, multiple sites: Secondary | ICD-10-CM | POA: Diagnosis not present

## 2020-12-23 DIAGNOSIS — Z79899 Other long term (current) drug therapy: Secondary | ICD-10-CM | POA: Diagnosis not present

## 2020-12-23 DIAGNOSIS — M9903 Segmental and somatic dysfunction of lumbar region: Secondary | ICD-10-CM | POA: Diagnosis not present

## 2020-12-23 DIAGNOSIS — M9901 Segmental and somatic dysfunction of cervical region: Secondary | ICD-10-CM | POA: Diagnosis not present

## 2020-12-23 DIAGNOSIS — M059 Rheumatoid arthritis with rheumatoid factor, unspecified: Secondary | ICD-10-CM | POA: Diagnosis not present

## 2020-12-30 DIAGNOSIS — R928 Other abnormal and inconclusive findings on diagnostic imaging of breast: Secondary | ICD-10-CM | POA: Diagnosis not present

## 2020-12-30 DIAGNOSIS — Z853 Personal history of malignant neoplasm of breast: Secondary | ICD-10-CM | POA: Diagnosis not present

## 2021-01-07 ENCOUNTER — Other Ambulatory Visit: Payer: Self-pay | Admitting: Family Medicine

## 2021-01-15 ENCOUNTER — Ambulatory Visit: Payer: Medicare Other | Admitting: Nurse Practitioner

## 2021-01-15 ENCOUNTER — Telehealth: Payer: Self-pay

## 2021-01-15 NOTE — Telephone Encounter (Signed)
Called pt no answer left vm. Per Dr Wynetta Emery she is scheduled next month so she doesn't need to be seen today unless she has concerns.

## 2021-02-02 ENCOUNTER — Encounter: Payer: Self-pay | Admitting: Family Medicine

## 2021-02-04 ENCOUNTER — Encounter: Payer: Self-pay | Admitting: Internal Medicine

## 2021-02-04 ENCOUNTER — Other Ambulatory Visit: Payer: Self-pay

## 2021-02-04 ENCOUNTER — Ambulatory Visit (INDEPENDENT_AMBULATORY_CARE_PROVIDER_SITE_OTHER): Payer: Medicare Other | Admitting: Internal Medicine

## 2021-02-04 VITALS — BP 109/74 | HR 99 | Temp 99.4°F | Ht 60.95 in | Wt 225.0 lb

## 2021-02-04 DIAGNOSIS — R102 Pelvic and perineal pain: Secondary | ICD-10-CM | POA: Diagnosis not present

## 2021-02-04 DIAGNOSIS — E119 Type 2 diabetes mellitus without complications: Secondary | ICD-10-CM | POA: Diagnosis not present

## 2021-02-04 LAB — URINALYSIS, ROUTINE W REFLEX MICROSCOPIC
Bilirubin, UA: NEGATIVE
Glucose, UA: NEGATIVE
Nitrite, UA: NEGATIVE
RBC, UA: NEGATIVE
Specific Gravity, UA: 1.015 (ref 1.005–1.030)
Urobilinogen, Ur: 0.2 mg/dL (ref 0.2–1.0)
pH, UA: 6 (ref 5.0–7.5)

## 2021-02-04 LAB — MICROSCOPIC EXAMINATION

## 2021-02-04 LAB — WET PREP FOR TRICH, YEAST, CLUE
Clue Cell Exam: NEGATIVE
Trichomonas Exam: NEGATIVE
Yeast Exam: NEGATIVE

## 2021-02-04 MED ORDER — CIPROFLOXACIN HCL 250 MG PO TABS: 250.0000 mg | ORAL_TABLET | Freq: Two times a day (BID) | ORAL | 0 refills | Status: AC

## 2021-02-04 MED ORDER — TRULICITY 1.5 MG/0.5ML ~~LOC~~ SOAJ: 1.5000 mg | SUBCUTANEOUS | 1 refills | Status: DC

## 2021-02-04 NOTE — Progress Notes (Signed)
BP 109/74   Pulse 99   Temp 99.4 F (37.4 C) (Oral)   Ht 5' 0.95" (1.548 m)   Wt 225 lb (102.1 kg)   LMP 03/17/2013 (Approximate)   SpO2 98%   BMI 42.59 kg/m    Subjective:    Patient ID: Sierra Copeland, female    DOB: 20-Feb-1956, 65 y.o.   MRN: [id number]  HPI: Sierra Copeland is a 65 y.o. female  Pelvic Pain The patient's primary symptoms include genital itching and pelvic pain. The patient's pertinent negatives include no genital rash, missed menses, vaginal bleeding or vaginal discharge. Primary symptoms comment: has had lower abdominal and back pain x  2 days . This is a new problem. The current episode started in the past 7 days. Associated symptoms include abdominal pain, back pain, diarrhea and a fever. Pertinent negatives include no anorexia, chills, constipation, discolored urine, dysuria, flank pain, frequency, headaches, hematuria, joint pain, joint swelling, nausea, rash, sore throat, urgency or vomiting.  Abdominal Pain This is a new (nausea + ) problem. The abdominal pain radiates to the suprapubic region. Associated symptoms include diarrhea and a fever. Pertinent negatives include no anorexia, arthralgias, belching, constipation, dysuria, flatus, frequency, headaches, hematuria, nausea or vomiting. Associated symptoms comments: 99 low grade temp.    Chief Complaint  Patient presents with  . Pelvic Pain    For past week, and some odor.   . Medication Problem    Patient believes that Trulicity is messing with her gallbladder    Relevant past medical, surgical, family and social history reviewed and updated as indicated. Interim medical history since our last visit reviewed. Allergies and medications reviewed and updated.  Review of Systems  Constitutional: Positive for fever. Negative for chills.  HENT: Negative for sore throat.   Gastrointestinal: Positive for abdominal pain and diarrhea. Negative for anorexia, constipation, flatus, nausea and vomiting.   Genitourinary: Positive for pelvic pain. Negative for dysuria, flank pain, frequency, hematuria, missed menses, urgency and vaginal discharge.  Musculoskeletal: Positive for back pain. Negative for arthralgias and joint pain.  Skin: Negative for rash.  Neurological: Negative for headaches.    Per HPI unless specifically indicated above     Objective:    BP 109/74   Pulse 99   Temp 99.4 F (37.4 C) (Oral)   Ht 5' 0.95" (1.548 m)   Wt 225 lb (102.1 kg)   LMP 03/17/2013 (Approximate)   SpO2 98%   BMI 42.59 kg/m   Wt Readings from Last 3 Encounters:  02/04/21 225 lb (102.1 kg)  09/23/20 234 lb (106.1 kg)  08/20/20 237 lb (107.5 kg)    Physical Exam  Results for orders placed or performed in visit on 89/38/10  Basic metabolic panel  Result Value Ref Range   Glucose 179 (H) 65 - 99 mg/dL   BUN 17 8 - 27 mg/dL   Creatinine, Ser 0.99 0.57 - 1.00 mg/dL   GFR calc non Af Amer 61 >59 mL/min/1.73   GFR calc Af Amer 70 >59 mL/min/1.73   BUN/Creatinine Ratio 17 12 - 28   Sodium 136 134 - 144 mmol/L   Potassium 4.7 3.5 - 5.2 mmol/L   Chloride 94 (L) 96 - 106 mmol/L   CO2 21 20 - 29 mmol/L   Calcium 9.8 8.7 - 10.3 mg/dL      Assessment & Plan:  1. UTI : ? ausing back pain and lower abdominal pain increase water intake [place]uks +ve on UA  Wet prep -  ve Start cipro in liew of low grade fever.  2. diarrhea worsening since trulicity dose increased per pt will need to decreased back to 1.5  Fu with pcp x 2 weeks.  3. Back pain : ? 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

## 2021-02-10 ENCOUNTER — Other Ambulatory Visit: Payer: Self-pay | Admitting: Family Medicine

## 2021-02-23 DIAGNOSIS — M5136 Other intervertebral disc degeneration, lumbar region: Secondary | ICD-10-CM | POA: Diagnosis not present

## 2021-02-23 DIAGNOSIS — M5030 Other cervical disc degeneration, unspecified cervical region: Secondary | ICD-10-CM | POA: Diagnosis not present

## 2021-02-23 DIAGNOSIS — M1711 Unilateral primary osteoarthritis, right knee: Secondary | ICD-10-CM | POA: Diagnosis not present

## 2021-02-23 DIAGNOSIS — M5416 Radiculopathy, lumbar region: Secondary | ICD-10-CM | POA: Diagnosis not present

## 2021-02-23 DIAGNOSIS — M48062 Spinal stenosis, lumbar region with neurogenic claudication: Secondary | ICD-10-CM | POA: Diagnosis not present

## 2021-02-24 ENCOUNTER — Other Ambulatory Visit: Payer: Self-pay

## 2021-02-24 ENCOUNTER — Ambulatory Visit (INDEPENDENT_AMBULATORY_CARE_PROVIDER_SITE_OTHER): Payer: Medicare Other | Admitting: Family Medicine

## 2021-02-24 ENCOUNTER — Encounter: Payer: Self-pay | Admitting: Family Medicine

## 2021-02-24 ENCOUNTER — Other Ambulatory Visit: Payer: Self-pay | Admitting: Family Medicine

## 2021-02-24 VITALS — BP 147/87 | HR 102 | Temp 97.8°F | Wt 228.8 lb

## 2021-02-24 DIAGNOSIS — I10 Essential (primary) hypertension: Secondary | ICD-10-CM | POA: Diagnosis not present

## 2021-02-24 DIAGNOSIS — L680 Hirsutism: Secondary | ICD-10-CM | POA: Diagnosis not present

## 2021-02-24 DIAGNOSIS — I129 Hypertensive chronic kidney disease with stage 1 through stage 4 chronic kidney disease, or unspecified chronic kidney disease: Secondary | ICD-10-CM

## 2021-02-24 DIAGNOSIS — Z794 Long term (current) use of insulin: Secondary | ICD-10-CM

## 2021-02-24 DIAGNOSIS — E875 Hyperkalemia: Secondary | ICD-10-CM

## 2021-02-24 DIAGNOSIS — E1165 Type 2 diabetes mellitus with hyperglycemia: Secondary | ICD-10-CM | POA: Diagnosis not present

## 2021-02-24 DIAGNOSIS — F3342 Major depressive disorder, recurrent, in full remission: Secondary | ICD-10-CM

## 2021-02-24 LAB — BAYER DCA HB A1C WAIVED: HB A1C (BAYER DCA - WAIVED): 8.7 % — ABNORMAL HIGH (ref <7.0)

## 2021-02-24 MED ORDER — TOUJEO SOLOSTAR 300 UNIT/ML ~~LOC~~ SOPN: PEN_INJECTOR | SUBCUTANEOUS | 12 refills | Status: DC

## 2021-02-24 MED ORDER — GVOKE HYPOPEN 2-PACK 1 MG/0.2ML ~~LOC~~ SOAJ: 1.0000 mg | SUBCUTANEOUS | 12 refills | Status: DC | PRN

## 2021-02-24 MED ORDER — SPIRONOLACTONE 25 MG PO TABS: 25.0000 mg | ORAL_TABLET | Freq: Every day | ORAL | 1 refills | Status: DC

## 2021-02-24 MED ORDER — FREESTYLE LIBRE 14 DAY SENSOR MISC: 12 refills | Status: DC

## 2021-02-24 MED ORDER — ESCITALOPRAM OXALATE 20 MG PO TABS: 20.0000 mg | ORAL_TABLET | Freq: Every day | ORAL | 1 refills | Status: DC

## 2021-02-24 NOTE — Progress Notes (Signed)
BP (!) 147/87   Pulse (!) 102   Temp 97.8 F (36.6 C)   Wt 228 lb 12.8 oz (103.8 kg)   LMP 03/17/2013 (Approximate)   SpO2 98%   BMI 43.31 kg/m    Subjective:    Patient ID: Sierra Copeland, female    DOB: 03/02/1956, 65 y.o.   MRN: [id number]  HPI: Sierra Copeland is a 65 y.o. female  Chief Complaint  Patient presents with  . Diabetes   DIABETES Hypoglycemic episodes:yes Polydipsia/polyuria: no Visual disturbance: no Chest pain: no Paresthesias: no Glucose Monitoring: yes  Accucheck frequency: BID  Fasting glucose: 120s-150s Taking Insulin?: yes Blood Pressure Monitoring: not checking Retinal Examination: Not up to Date Foot Exam: Up to Date Diabetic Education: Completed Pneumovax: Up to Date Influenza: Up to Date Aspirin: yes  DEPRESSION- having a lot of trouble loosing weight. She would like to change from paxil to something else Mood status: controlled Satisfied with current treatment?: no Symptom severity: mild  Duration of current treatment : chronic Side effects: no Medication compliance: excellent compliance Psychotherapy/counseling: no  Previous psychiatric medications: paxil Depressed mood: no Anxious mood: no Anhedonia: no Significant weight loss or gain: no Insomnia: no  Fatigue: yes Feelings of worthlessness or guilt: no Impaired concentration/indecisiveness: no Suicidal ideations: no Hopelessness: no Crying spells: no Depression screen [facility] 2/9 02/24/2021 02/04/2021 09/23/2020 08/20/2020 06/09/2020  Decreased Interest 0 0 0 0 0  Down, Depressed, Hopeless 0 0 0 0 0  PHQ - 2 Score 0 0 0 0 0  Altered sleeping 0 - 0 0 0  Tired, decreased energy 0 - 0 1 1  Change in appetite 0 - 0 0 0  Feeling bad or failure about yourself  0 - 0 0 0  Trouble concentrating 0 - 0 0 0  Moving slowly or fidgety/restless 0 - 0 0 0  Suicidal thoughts 0 - 0 0 0  PHQ-9 Score 0 - 0 1 1  Difficult doing work/chores - - Not difficult at all Not difficult at all Not  difficult at all  Some recent data might be hidden   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 She would like to do something about it  HYPERTENSION Hypertension status: stable  Satisfied with current treatment? yes Duration of hypertension: chronic BP monitoring frequency:  not checking BP medication side effects:  no Medication compliance: excellent compliance Previous BP meds:lisinopril, amlodipine Aspirin: no Recurrent headaches: no Visual changes: no Palpitations: no Dyspnea: no Chest pain: no Lower extremity edema: no Dizzy/lightheaded: no   Relevant past medical, surgical, family and social history reviewed and updated as indicated. Interim medical history since our last visit reviewed. Allergies and medications reviewed and updated.  Review of Systems  Constitutional: Negative.   Respiratory: Negative.   Cardiovascular: Negative.   Gastrointestinal: Negative.   Musculoskeletal: Negative.   Neurological: Negative.   Psychiatric/Behavioral: Negative.     Per HPI unless specifically indicated above     Objective:    BP (!) 147/87   Pulse (!) 102   Temp 97.8 F (36.6 C)   Wt 228 lb 12.8 oz (103.8 kg)   LMP 03/17/2013 (Approximate)   SpO2 98%   BMI 43.31 kg/m   Wt Readings from Last 3 Encounters:  02/24/21 228 lb 12.8 oz (103.8 kg)  02/04/21 225 lb (102.1 kg)  09/23/20 234 lb (106.1 kg)    Physical Exam Vitals and nursing note reviewed.  Constitutional:      General:  She is not in acute distress.    Appearance: Normal appearance. She is obese. She is not ill-appearing, toxic-appearing or diaphoretic.  HENT:     Head: Normocephalic and atraumatic.     Right Ear: External ear normal.     Left Ear: External ear normal.     Nose: Nose normal.     Mouth/Throat:     Mouth: Mucous membranes are moist.     Pharynx: Oropharynx is clear.  Eyes:     General: No scleral icterus.       Right eye: No discharge.        Left eye: No discharge.      Extraocular Movements: Extraocular movements intact.     Conjunctiva/sclera: Conjunctivae normal.     Pupils: Pupils are equal, round, and reactive to light.  Cardiovascular:     Rate and Rhythm: Normal rate and regular rhythm.     Pulses: Normal pulses.     Heart sounds: Normal heart sounds. No murmur heard. No friction rub. No gallop.   Pulmonary:     Effort: Pulmonary effort is normal. No respiratory distress.     Breath sounds: Normal breath sounds. No stridor. No wheezing, rhonchi or rales.  Chest:     Chest wall: No tenderness.  Musculoskeletal:        General: Normal range of motion.     Cervical back: Normal range of motion and neck supple.  Skin:    General: Skin is warm and dry.     Capillary Refill: Capillary refill takes less than 2 seconds.     Coloration: Skin is not jaundiced or pale.     Findings: No bruising, erythema, lesion or rash.  Neurological:     General: No focal deficit present.     Mental Status: She is alert and oriented to person, place, and time. Mental status is at baseline.  Psychiatric:        Mood and Affect: Mood normal.        Behavior: Behavior normal.        Thought Content: Thought content normal.        Judgment: Judgment normal.     Results for orders placed or performed in visit on 02/24/21  Bayer DCA Hb A1c Waived  Result Value Ref Range   HB A1C (BAYER DCA - WAIVED) 8.7 (H) <7.0 %      Assessment & Plan:   Problem List Items Addressed This Visit      Cardiovascular and Mediastinum   RESOLVED: Essential (primary) hypertension   Relevant Medications   spironolactone (ALDACTONE) 25 MG tablet     Endocrine   Type 2 diabetes mellitus with hyperglycemia (HCC) - Primary    A1c up to 8.7. Unable to tolerate higher dose of trulicity. We will increase her tresiba- BID dosing, so continue 80units at bed time and add 20 units at lunch. Recheck 1 month. Hypopen sent for emergencies.       Relevant Medications   Glucagon (GVOKE  HYPOPEN 2-PACK) 1 MG/0.2ML SOAJ   insulin glargine, 1 Unit Dial, (TOUJEO SOLOSTAR) 300 UNIT/ML Solostar Pen   Other Relevant Orders   Bayer DCA Hb A1c Waived (Completed)     Genitourinary   Benign hypertensive renal disease    Running a little high today. She would like to treat her hirsuitism. Will will change her amlodipine to spironalactone, but given that she has a history of hyperkalemia- will get her back in in 2 weeks for a  recheck on her bmp. Recheck 1 months.         Other   Recurrent major depressive disorder, in full remission ([place])    Doing great but would like to change meds to try to help with weight loss. Will change from paxil to lexapro. Recheck 1 month. Call with any concerns.       Relevant Medications   escitalopram (LEXAPRO) 20 MG tablet    Other Visit Diagnoses    Hyperkalemia       Will check labs in 2 weeks. Await results. May not be able to stay on spironalactone.    Relevant Orders   Basic metabolic panel   Hirsutism       Will start spironalactone to treat BP and hirsuitism. If potassium goes up, may need to go back to amlodpine.        Follow up plan: Return for Lab in 2 weeks, follow up wiht me in 4.

## 2021-02-25 DIAGNOSIS — M9901 Segmental and somatic dysfunction of cervical region: Secondary | ICD-10-CM | POA: Diagnosis not present

## 2021-02-25 DIAGNOSIS — M9903 Segmental and somatic dysfunction of lumbar region: Secondary | ICD-10-CM | POA: Diagnosis not present

## 2021-02-25 DIAGNOSIS — M5432 Sciatica, left side: Secondary | ICD-10-CM | POA: Diagnosis not present

## 2021-02-25 DIAGNOSIS — M5412 Radiculopathy, cervical region: Secondary | ICD-10-CM | POA: Diagnosis not present

## 2021-02-25 NOTE — Assessment & Plan Note (Signed)
A1c up to 8.7. Unable to tolerate higher dose of trulicity. We will increase her tresiba- BID dosing, so continue 80units at bed time and add 20 units at lunch. Recheck 1 month. Hypopen sent for emergencies.

## 2021-02-25 NOTE — Assessment & Plan Note (Signed)
Running a little high today. She would like to treat her hirsuitism. Will will change her amlodipine to spironalactone, but given that she has a history of hyperkalemia- will get her back in in 2 weeks for a recheck on her bmp. Recheck 1 months.

## 2021-02-25 NOTE — Assessment & Plan Note (Signed)
Doing great but would like to change meds to try to help with weight loss. Will change from paxil to lexapro. Recheck 1 month. Call with any concerns.

## 2021-02-25 NOTE — Assessment & Plan Note (Deleted)
Running a little high today. She would like to treat her hirsuitism. Will will change her amlodipine to spironalactone, but given that she has a history of hyperkalemia- will get her back in in 2 weeks for a recheck on her bmp. Recheck 1 months.

## 2021-03-11 ENCOUNTER — Other Ambulatory Visit: Payer: Medicare Other

## 2021-03-11 ENCOUNTER — Telehealth: Payer: Self-pay

## 2021-03-11 ENCOUNTER — Ambulatory Visit: Payer: Medicare Other | Admitting: Family Medicine

## 2021-03-11 DIAGNOSIS — M5412 Radiculopathy, cervical region: Secondary | ICD-10-CM | POA: Diagnosis not present

## 2021-03-11 DIAGNOSIS — M9901 Segmental and somatic dysfunction of cervical region: Secondary | ICD-10-CM | POA: Diagnosis not present

## 2021-03-11 DIAGNOSIS — M9903 Segmental and somatic dysfunction of lumbar region: Secondary | ICD-10-CM | POA: Diagnosis not present

## 2021-03-11 DIAGNOSIS — M5432 Sciatica, left side: Secondary | ICD-10-CM | POA: Diagnosis not present

## 2021-03-11 NOTE — Telephone Encounter (Signed)
Lvm to see about coming in for labs later due to tech coming in later

## 2021-03-16 ENCOUNTER — Other Ambulatory Visit: Payer: Medicare Other

## 2021-03-23 DIAGNOSIS — M059 Rheumatoid arthritis with rheumatoid factor, unspecified: Secondary | ICD-10-CM | POA: Diagnosis not present

## 2021-03-23 DIAGNOSIS — Z79899 Other long term (current) drug therapy: Secondary | ICD-10-CM | POA: Diagnosis not present

## 2021-03-23 DIAGNOSIS — M5136 Other intervertebral disc degeneration, lumbar region: Secondary | ICD-10-CM | POA: Diagnosis not present

## 2021-03-26 ENCOUNTER — Ambulatory Visit: Payer: Medicare Other | Admitting: Family Medicine

## 2021-04-03 DIAGNOSIS — M9901 Segmental and somatic dysfunction of cervical region: Secondary | ICD-10-CM | POA: Diagnosis not present

## 2021-04-03 DIAGNOSIS — M5432 Sciatica, left side: Secondary | ICD-10-CM | POA: Diagnosis not present

## 2021-04-03 DIAGNOSIS — M5412 Radiculopathy, cervical region: Secondary | ICD-10-CM | POA: Diagnosis not present

## 2021-04-03 DIAGNOSIS — M9903 Segmental and somatic dysfunction of lumbar region: Secondary | ICD-10-CM | POA: Diagnosis not present

## 2021-04-16 ENCOUNTER — Other Ambulatory Visit: Payer: Self-pay

## 2021-04-16 ENCOUNTER — Other Ambulatory Visit: Payer: Medicare Other

## 2021-04-16 DIAGNOSIS — E875 Hyperkalemia: Secondary | ICD-10-CM

## 2021-04-17 LAB — BASIC METABOLIC PANEL
BUN/Creatinine Ratio: 17 (ref 12–28)
BUN: 18 mg/dL (ref 8–27)
CO2: 19 mmol/L — ABNORMAL LOW (ref 20–29)
Calcium: 10 mg/dL (ref 8.7–10.3)
Chloride: 96 mmol/L (ref 96–106)
Creatinine, Ser: 1.09 mg/dL — ABNORMAL HIGH (ref 0.57–1.00)
Glucose: 179 mg/dL — ABNORMAL HIGH (ref 65–99)
Potassium: 4.9 mmol/L (ref 3.5–5.2)
Sodium: 136 mmol/L (ref 134–144)
eGFR: 57 mL/min/{1.73_m2} — ABNORMAL LOW (ref >59)

## 2021-04-20 ENCOUNTER — Ambulatory Visit (INDEPENDENT_AMBULATORY_CARE_PROVIDER_SITE_OTHER): Payer: Medicare Other | Admitting: Family Medicine

## 2021-04-20 ENCOUNTER — Other Ambulatory Visit: Payer: Self-pay

## 2021-04-20 ENCOUNTER — Encounter: Payer: Self-pay | Admitting: Family Medicine

## 2021-04-20 VITALS — BP 129/78 | HR 86 | Temp 98.2°F | Wt 220.0 lb

## 2021-04-20 DIAGNOSIS — M5442 Lumbago with sciatica, left side: Secondary | ICD-10-CM

## 2021-04-20 DIAGNOSIS — Z794 Long term (current) use of insulin: Secondary | ICD-10-CM

## 2021-04-20 DIAGNOSIS — F3342 Major depressive disorder, recurrent, in full remission: Secondary | ICD-10-CM | POA: Diagnosis not present

## 2021-04-20 DIAGNOSIS — E1165 Type 2 diabetes mellitus with hyperglycemia: Secondary | ICD-10-CM | POA: Diagnosis not present

## 2021-04-20 DIAGNOSIS — I129 Hypertensive chronic kidney disease with stage 1 through stage 4 chronic kidney disease, or unspecified chronic kidney disease: Secondary | ICD-10-CM

## 2021-04-20 DIAGNOSIS — C50912 Malignant neoplasm of unspecified site of left female breast: Secondary | ICD-10-CM | POA: Diagnosis not present

## 2021-04-20 DIAGNOSIS — M059 Rheumatoid arthritis with rheumatoid factor, unspecified: Secondary | ICD-10-CM | POA: Diagnosis not present

## 2021-04-20 MED ORDER — KETOROLAC TROMETHAMINE 60 MG/2ML IM SOLN
60.0000 mg | Freq: Once | INTRAMUSCULAR | Status: AC
  Administered 2021-04-20: 60 mg via INTRAMUSCULAR

## 2021-04-20 NOTE — Progress Notes (Signed)
 BP 129/78   Pulse 86   Temp 98.2 F (36.8 C)   Wt 220 lb (99.8 kg)   LMP 03/17/2013 (Approximate)   SpO2 97%   BMI 41.64 kg/m    Subjective:    Patient ID: Sierra Copeland, female    DOB: 08/07/1956, 65 y.o.   MRN: [id number]  HPI: Sierra Copeland is a 65 y.o. female  Chief Complaint  Patient presents with  . Diabetes  . Depression  . Hypertension   DIABETES Hypoglycemic episodes:no Polydipsia/polyuria: no Visual disturbance: no Chest pain: no Paresthesias: no Glucose Monitoring: yes  Accucheck frequency: BID  Fasting glucose: 130  Evening: 180 Taking Insulin?: yes Blood Pressure Monitoring: not checking Retinal Examination: Not up to Date Foot Exam: Up to Date Diabetic Education: Completed Pneumovax: Up to Date Influenza: Up to Date Aspirin: yes  HYPERTENSION / HYPERLIPIDEMIA Satisfied with current treatment? yes Duration of hypertension: chronic BP monitoring frequency: not checking BP medication side effects: no Duration of hyperlipidemia: chronic Cholesterol medication side effects: no Cholesterol supplements: none Past cholesterol medications: simvastatin Medication compliance: excellent compliance Aspirin: yes Recent stressors: no Recurrent headaches: no Visual changes: no Palpitations: no Dyspnea: no Chest pain: no Lower extremity edema: no Dizzy/lightheaded: no  DEPRESSION Mood status: controlled Satisfied with current treatment?: no Symptom severity: mild  Duration of current treatment : chronic Side effects: no Medication compliance: excellent compliance Psychotherapy/counseling: no  Previous psychiatric medications: paxil- now lexapro Depressed mood: no Anxious mood: no Anhedonia: no Significant weight loss or gain: no Insomnia: no  Fatigue: no Feelings of worthlessness or guilt: no Impaired concentration/indecisiveness: no Suicidal ideations: no Hopelessness: no Crying spells: no Depression screen PHQ 2/9 04/20/2021 02/24/2021  02/04/2021 09/23/2020 08/20/2020  Decreased Interest 0 0 0 0 0  Down, Depressed, Hopeless 0 0 0 0 0  PHQ - 2 Score 0 0 0 0 0  Altered sleeping 0 0 - 0 0  Tired, decreased energy 0 0 - 0 1  Change in appetite 0 0 - 0 0  Feeling bad or failure about yourself  0 0 - 0 0  Trouble concentrating 0 0 - 0 0  Moving slowly or fidgety/restless 0 0 - 0 0  Suicidal thoughts 0 0 - 0 0  PHQ-9 Score 0 0 - 0 1  Difficult doing work/chores - - - Not difficult at all Not difficult at all  Some recent data might be hidden    Relevant past medical, surgical, family and social history reviewed and updated as indicated. Interim medical history since our last visit reviewed. Allergies and medications reviewed and updated.  Review of Systems  Constitutional: Negative.   Respiratory: Negative.   Cardiovascular: Negative.   Gastrointestinal: Negative.   Musculoskeletal: Negative.   Psychiatric/Behavioral: Negative.     Per HPI unless specifically indicated above     Objective:    BP 129/78   Pulse 86   Temp 98.2 F (36.8 C)   Wt 220 lb (99.8 kg)   LMP 03/17/2013 (Approximate)   SpO2 97%   BMI 41.64 kg/m   Wt Readings from Last 3 Encounters:  04/20/21 220 lb (99.8 kg)  02/24/21 228 lb 12.8 oz (103.8 kg)  02/04/21 225 lb (102.1 kg)    Physical Exam Vitals and nursing note reviewed.  Constitutional:      General: She is not in acute distress.    Appearance: Normal appearance. She is obese. She is not ill-appearing, toxic-appearing or diaphoretic.  HENT:       Head: Normocephalic and atraumatic.     Right Ear: External ear normal.     Left Ear: External ear normal.     Nose: Nose normal.     Mouth/Throat:     Mouth: Mucous membranes are moist.     Pharynx: Oropharynx is clear.  Eyes:     General: No scleral icterus.       Right eye: No discharge.        Left eye: No discharge.     Extraocular Movements: Extraocular movements intact.     Conjunctiva/sclera: Conjunctivae normal.      Pupils: Pupils are equal, round, and reactive to light.  Cardiovascular:     Rate and Rhythm: Normal rate and regular rhythm.     Pulses: Normal pulses.     Heart sounds: Normal heart sounds. No murmur heard. No friction rub. No gallop.   Pulmonary:     Effort: Pulmonary effort is normal. No respiratory distress.     Breath sounds: Normal breath sounds. No stridor. No wheezing, rhonchi or rales.  Chest:     Chest wall: No tenderness.  Musculoskeletal:        General: Normal range of motion.     Cervical back: Normal range of motion and neck supple.  Skin:    General: Skin is warm and dry.     Capillary Refill: Capillary refill takes less than 2 seconds.     Coloration: Skin is not jaundiced or pale.     Findings: No bruising, erythema, lesion or rash.  Neurological:     General: No focal deficit present.     Mental Status: She is alert and oriented to person, place, and time. Mental status is at baseline.  Psychiatric:        Mood and Affect: Mood normal.        Behavior: Behavior normal.        Thought Content: Thought content normal.        Judgment: Judgment normal.     Results for orders placed or performed in visit on 04/16/21  Basic metabolic panel  Result Value Ref Range   Glucose 179 (H) 65 - 99 mg/dL   BUN 18 8 - 27 mg/dL   Creatinine, Ser 1.09 (H) 0.57 - 1.00 mg/dL   eGFR 57 (L) >59 mL/min/1.73   BUN/Creatinine Ratio 17 12 - 28   Sodium 136 134 - 144 mmol/L   Potassium 4.9 3.5 - 5.2 mmol/L   Chloride 96 96 - 106 mmol/L   CO2 19 (L) 20 - 29 mmol/L   Calcium 10.0 8.7 - 10.3 mg/dL      Assessment & Plan:   Problem List Items Addressed This Visit      Endocrine   Type 2 diabetes mellitus with hyperglycemia (HCC) - Primary    Sugars doing OK. Due for A1c in 1 month. Will get her back then for recheck. Continue to monitor. Call with any concerns.         Musculoskeletal and Integument   Rheumatoid arthritis (HCC)    Follows with rheumatology. Stable.  Continue to monitor. Call with any concerns.         Genitourinary   Benign hypertensive renal disease    Under good control on current regimen. Continue current regimen. Continue to monitor. Call with any concerns. Refills up to date. Potassium was good last check- will recheck next visit.          Other   Morbid obesity (HCC)      Congratulated patient on 8lb weight loss. Continue to monitor. Call with any concerns.       Invasive ductal carcinoma of breast, female, left [facility])    Follows with oncology. Stable. Continue to monitor. Call with any concerns.       Back pain    In exacerbation. Toradol shot given today.      Recurrent major depressive disorder, in full remission ([place])    Doing well on lexapro. Has lost weight off paxil. Call with any concerns. Continue to monitor.           Follow up plan: Return in about 4 weeks (around 05/18/2021).

## 2021-04-20 NOTE — Assessment & Plan Note (Signed)
Doing well on lexapro. Has lost weight off paxil. Call with any concerns. Continue to monitor.

## 2021-04-20 NOTE — Assessment & Plan Note (Signed)
Follows with rheumatology. Stable. Continue to monitor. Call with any concerns.

## 2021-04-20 NOTE — Assessment & Plan Note (Signed)
Congratulated patient on 8lb weight loss. Continue to monitor. Call with any concerns.

## 2021-04-20 NOTE — Assessment & Plan Note (Addendum)
Under good control on current regimen. Continue current regimen. Continue to monitor. Call with any concerns. Refills up to date. Potassium was good last check- will recheck next visit.

## 2021-04-20 NOTE — Assessment & Plan Note (Signed)
In exacerbation. Toradol shot given today.

## 2021-04-20 NOTE — Assessment & Plan Note (Signed)
Follows with oncology. Stable. Continue to monitor. Call with any concerns.  

## 2021-04-20 NOTE — Assessment & Plan Note (Signed)
Sugars doing OK. Due for A1c in 1 month. Will get her back then for recheck. Continue to monitor. Call with any concerns.

## 2021-05-08 ENCOUNTER — Other Ambulatory Visit: Payer: Self-pay | Admitting: Family Medicine

## 2021-05-12 DIAGNOSIS — R197 Diarrhea, unspecified: Secondary | ICD-10-CM | POA: Diagnosis not present

## 2021-05-12 DIAGNOSIS — K219 Gastro-esophageal reflux disease without esophagitis: Secondary | ICD-10-CM | POA: Diagnosis not present

## 2021-05-12 DIAGNOSIS — K52831 Collagenous colitis: Secondary | ICD-10-CM | POA: Diagnosis not present

## 2021-05-20 ENCOUNTER — Other Ambulatory Visit: Payer: Self-pay | Admitting: Family Medicine

## 2021-05-20 NOTE — Telephone Encounter (Signed)
Rx denied- call to pharmacy- Rx filled today- request for future RF- denied- patient may need follow up for RF

## 2021-05-26 DIAGNOSIS — M5432 Sciatica, left side: Secondary | ICD-10-CM | POA: Diagnosis not present

## 2021-05-26 DIAGNOSIS — M9901 Segmental and somatic dysfunction of cervical region: Secondary | ICD-10-CM | POA: Diagnosis not present

## 2021-05-26 DIAGNOSIS — M9903 Segmental and somatic dysfunction of lumbar region: Secondary | ICD-10-CM | POA: Diagnosis not present

## 2021-05-26 DIAGNOSIS — M5412 Radiculopathy, cervical region: Secondary | ICD-10-CM | POA: Diagnosis not present

## 2021-05-28 ENCOUNTER — Other Ambulatory Visit: Payer: Self-pay

## 2021-05-28 ENCOUNTER — Ambulatory Visit (INDEPENDENT_AMBULATORY_CARE_PROVIDER_SITE_OTHER): Payer: Medicare Other | Admitting: Family Medicine

## 2021-05-28 ENCOUNTER — Encounter: Payer: Self-pay | Admitting: Family Medicine

## 2021-05-28 VITALS — BP 133/66 | HR 91 | Temp 98.0°F | Wt 224.0 lb

## 2021-05-28 DIAGNOSIS — I129 Hypertensive chronic kidney disease with stage 1 through stage 4 chronic kidney disease, or unspecified chronic kidney disease: Secondary | ICD-10-CM | POA: Diagnosis not present

## 2021-05-28 DIAGNOSIS — E875 Hyperkalemia: Secondary | ICD-10-CM | POA: Diagnosis not present

## 2021-05-28 DIAGNOSIS — Z794 Long term (current) use of insulin: Secondary | ICD-10-CM

## 2021-05-28 DIAGNOSIS — E1165 Type 2 diabetes mellitus with hyperglycemia: Secondary | ICD-10-CM | POA: Diagnosis not present

## 2021-05-28 LAB — BAYER DCA HB A1C WAIVED: HB A1C (BAYER DCA - WAIVED): 8.3 % — ABNORMAL HIGH (ref <7.0)

## 2021-05-28 MED ORDER — FREESTYLE LIBRE 14 DAY SENSOR MISC: 1.0000 | 3 refills | Status: DC

## 2021-05-28 MED ORDER — TRULICITY 1.5 MG/0.5ML ~~LOC~~ SOAJ: 1.5000 mg | SUBCUTANEOUS | 1 refills | Status: DC

## 2021-05-28 MED ORDER — AMLODIPINE BESYLATE 2.5 MG PO TABS: 2.5000 mg | ORAL_TABLET | Freq: Every day | ORAL | 1 refills | Status: DC

## 2021-05-28 MED ORDER — GABAPENTIN 100 MG PO CAPS: 100.0000 mg | ORAL_CAPSULE | Freq: Two times a day (BID) | ORAL | 1 refills | Status: DC

## 2021-05-28 NOTE — Assessment & Plan Note (Signed)
Under good control on current regimen. Continue current regimen. Continue to monitor. Call with any concerns. Refills given. Labs drawn today.   

## 2021-05-28 NOTE — Progress Notes (Signed)
BP 133/66   Pulse 91   Temp 98 F (36.7 C)   Wt 224 lb (101.6 kg)   LMP 03/17/2013 (Approximate)   SpO2 97%   BMI 42.40 kg/m    Subjective:    Patient ID: Sierra Copeland, female    DOB: 01-Mar-1956, 65 y.o.   MRN: [id number]  HPI: Sierra Copeland is a 65 y.o. female  Chief Complaint  Patient presents with   Diabetes   Hypertension   HYPERTENSION Hypertension status: stable  Satisfied with current treatment? yes Duration of hypertension: chronic BP monitoring frequency:  not checking BP medication side effects:  no Medication compliance: excellent compliance Aspirin: no Recurrent headaches: no Visual changes: no Palpitations: no Dyspnea: no Chest pain: no Lower extremity edema: no Dizzy/lightheaded: no  DIABETES Hypoglycemic episodes:yes Polydipsia/polyuria: no Visual disturbance: no Chest pain: no Paresthesias: no Glucose Monitoring: yes  Accucheck frequency: BID  Fasting glucose: 119 Taking Insulin?: yes Blood Pressure Monitoring: not checking Retinal Examination: Up to Date Foot Exam: Up to Date Diabetic Education: Completed Pneumovax: Up to Date Influenza: Up to Date Aspirin: no  Relevant past medical, surgical, family and social history reviewed and updated as indicated. Interim medical history since our last visit reviewed. Allergies and medications reviewed and updated.  Review of Systems  Constitutional: Negative.   Respiratory: Negative.    Cardiovascular: Negative.   Gastrointestinal: Negative.   Musculoskeletal: Negative.   Psychiatric/Behavioral: Negative.     Per HPI unless specifically indicated above     Objective:    BP 133/66   Pulse 91   Temp 98 F (36.7 C)   Wt 224 lb (101.6 kg)   LMP 03/17/2013 (Approximate)   SpO2 97%   BMI 42.40 kg/m   Wt Readings from Last 3 Encounters:  05/28/21 224 lb (101.6 kg)  04/20/21 220 lb (99.8 kg)  02/24/21 228 lb 12.8 oz (103.8 kg)    Physical Exam Vitals and nursing note reviewed.   Constitutional:      General: She is not in acute distress.    Appearance: Normal appearance. She is not ill-appearing, toxic-appearing or diaphoretic.  HENT:     Head: Normocephalic and atraumatic.     Right Ear: External ear normal.     Left Ear: External ear normal.     Nose: Nose normal.     Mouth/Throat:     Mouth: Mucous membranes are moist.     Pharynx: Oropharynx is clear.  Eyes:     General: No scleral icterus.       Right eye: No discharge.        Left eye: No discharge.     Extraocular Movements: Extraocular movements intact.     Conjunctiva/sclera: Conjunctivae normal.     Pupils: Pupils are equal, round, and reactive to light.  Cardiovascular:     Rate and Rhythm: Normal rate and regular rhythm.     Pulses: Normal pulses.     Heart sounds: Normal heart sounds. No murmur heard.   No friction rub. No gallop.  Pulmonary:     Effort: Pulmonary effort is normal. No respiratory distress.     Breath sounds: Normal breath sounds. No stridor. No wheezing, rhonchi or rales.  Chest:     Chest wall: No tenderness.  Musculoskeletal:        General: Normal range of motion.     Cervical back: Normal range of motion and neck supple.  Skin:    General: Skin is warm and dry.  Capillary Refill: Capillary refill takes less than 2 seconds.     Coloration: Skin is not jaundiced or pale.     Findings: No bruising, erythema, lesion or rash.  Neurological:     General: No focal deficit present.     Mental Status: She is alert and oriented to person, place, and time. Mental status is at baseline.  Psychiatric:        Mood and Affect: Mood normal.        Behavior: Behavior normal.        Thought Content: Thought content normal.        Judgment: Judgment normal.    Results for orders placed or performed in visit on 93/81/01  Basic metabolic panel  Result Value Ref Range   Glucose 179 (H) 65 - 99 mg/dL   BUN 18 8 - 27 mg/dL   Creatinine, Ser 1.09 (H) 0.57 - 1.00 mg/dL   eGFR  57 (L) >59 mL/min/1.73   BUN/Creatinine Ratio 17 12 - 28   Sodium 136 134 - 144 mmol/L   Potassium 4.9 3.5 - 5.2 mmol/L   Chloride 96 96 - 106 mmol/L   CO2 19 (L) 20 - 29 mmol/L   Calcium 10.0 8.7 - 10.3 mg/dL      Assessment & Plan:   Problem List Items Addressed This Visit       Endocrine   Type 2 diabetes mellitus with hyperglycemia (HCC) - Primary    Improved with A1c of 8.3 down from 8.7- would like to stay on current regimen now. Recheck 3 months. Call with any concerns.        Relevant Medications   Dulaglutide (TRULICITY) 1.5 BP/1.0CH SOPN   Other Relevant Orders   Bayer DCA Hb A1c Waived   Comprehensive metabolic panel   Lipid Panel w/o Chol/HDL Ratio     Genitourinary   Benign hypertensive renal disease    Under good control on current regimen. Continue current regimen. Continue to monitor. Call with any concerns. Refills given. Labs drawn today.        Relevant Orders   Comprehensive metabolic panel   Other Visit Diagnoses     Hyperkalemia       Rechecking labs today. Await results. Treat as needed.    Relevant Orders   Comprehensive metabolic panel        Follow up plan: Return in about 3 months (around 08/28/2021).

## 2021-05-28 NOTE — Assessment & Plan Note (Signed)
Improved with A1c of 8.3 down from 8.7- would like to stay on current regimen now. Recheck 3 months. Call with any concerns.

## 2021-05-29 LAB — LIPID PANEL W/O CHOL/HDL RATIO
Cholesterol, Total: 147 mg/dL (ref 100–199)
HDL: 50 mg/dL (ref >39)
LDL Chol Calc (NIH): 65 mg/dL (ref 0–99)
Triglycerides: 193 mg/dL — ABNORMAL HIGH (ref 0–149)
VLDL Cholesterol Cal: 32 mg/dL (ref 5–40)

## 2021-05-29 LAB — COMPREHENSIVE METABOLIC PANEL
ALT: 24 IU/L (ref 0–32)
AST: 25 IU/L (ref 0–40)
Albumin/Globulin Ratio: 2 (ref 1.2–2.2)
Albumin: 4.6 g/dL (ref 3.8–4.8)
Alkaline Phosphatase: 42 IU/L — ABNORMAL LOW (ref 44–121)
BUN/Creatinine Ratio: 15 (ref 12–28)
BUN: 16 mg/dL (ref 8–27)
Bilirubin Total: 0.4 mg/dL (ref 0.0–1.2)
CO2: 21 mmol/L (ref 20–29)
Calcium: 10.1 mg/dL (ref 8.7–10.3)
Chloride: 97 mmol/L (ref 96–106)
Creatinine, Ser: 1.08 mg/dL — ABNORMAL HIGH (ref 0.57–1.00)
Globulin, Total: 2.3 g/dL (ref 1.5–4.5)
Glucose: 148 mg/dL — ABNORMAL HIGH (ref 65–99)
Potassium: 4.9 mmol/L (ref 3.5–5.2)
Sodium: 138 mmol/L (ref 134–144)
Total Protein: 6.9 g/dL (ref 6.0–8.5)
eGFR: 58 mL/min/{1.73_m2} — ABNORMAL LOW (ref >59)

## 2021-06-15 DIAGNOSIS — C50412 Malignant neoplasm of upper-outer quadrant of left female breast: Secondary | ICD-10-CM | POA: Diagnosis not present

## 2021-06-15 DIAGNOSIS — Z171 Estrogen receptor negative status [ER-]: Secondary | ICD-10-CM | POA: Diagnosis not present

## 2021-06-15 DIAGNOSIS — N6321 Unspecified lump in the left breast, upper outer quadrant: Secondary | ICD-10-CM | POA: Diagnosis not present

## 2021-06-15 LAB — HM MAMMOGRAPHY

## 2021-06-26 DIAGNOSIS — M5432 Sciatica, left side: Secondary | ICD-10-CM | POA: Diagnosis not present

## 2021-06-26 DIAGNOSIS — M9901 Segmental and somatic dysfunction of cervical region: Secondary | ICD-10-CM | POA: Diagnosis not present

## 2021-06-26 DIAGNOSIS — M9903 Segmental and somatic dysfunction of lumbar region: Secondary | ICD-10-CM | POA: Diagnosis not present

## 2021-06-26 DIAGNOSIS — M5412 Radiculopathy, cervical region: Secondary | ICD-10-CM | POA: Diagnosis not present

## 2021-07-17 DIAGNOSIS — M5412 Radiculopathy, cervical region: Secondary | ICD-10-CM | POA: Diagnosis not present

## 2021-07-17 DIAGNOSIS — M9901 Segmental and somatic dysfunction of cervical region: Secondary | ICD-10-CM | POA: Diagnosis not present

## 2021-07-17 DIAGNOSIS — M9903 Segmental and somatic dysfunction of lumbar region: Secondary | ICD-10-CM | POA: Diagnosis not present

## 2021-07-17 DIAGNOSIS — M5432 Sciatica, left side: Secondary | ICD-10-CM | POA: Diagnosis not present

## 2021-08-05 DIAGNOSIS — M5412 Radiculopathy, cervical region: Secondary | ICD-10-CM | POA: Diagnosis not present

## 2021-08-05 DIAGNOSIS — M5432 Sciatica, left side: Secondary | ICD-10-CM | POA: Diagnosis not present

## 2021-08-05 DIAGNOSIS — M9901 Segmental and somatic dysfunction of cervical region: Secondary | ICD-10-CM | POA: Diagnosis not present

## 2021-08-05 DIAGNOSIS — M9903 Segmental and somatic dysfunction of lumbar region: Secondary | ICD-10-CM | POA: Diagnosis not present

## 2021-08-07 DIAGNOSIS — J342 Deviated nasal septum: Secondary | ICD-10-CM | POA: Diagnosis not present

## 2021-08-07 DIAGNOSIS — S022XXA Fracture of nasal bones, initial encounter for closed fracture: Secondary | ICD-10-CM | POA: Diagnosis not present

## 2021-08-10 DIAGNOSIS — E119 Type 2 diabetes mellitus without complications: Secondary | ICD-10-CM | POA: Diagnosis not present

## 2021-08-10 LAB — HM DIABETES EYE EXAM

## 2021-08-20 ENCOUNTER — Other Ambulatory Visit: Payer: Self-pay | Admitting: Family Medicine

## 2021-08-20 NOTE — Telephone Encounter (Signed)
Requested medications are due for refill today yes  Requested medications are on the active medication list yes  Last refill 6/22 for Prilosec, 11/24/21 for Insulin  Last visit 05/28/21  Future visit scheduled 08/24/21  Notes to clinic Do not see GERD addressed in last 12 months, coming in next week, not sure if insulin dose will remain same or how much to order this close to visit, please assess.

## 2021-08-21 ENCOUNTER — Telehealth: Payer: Self-pay | Admitting: Family Medicine

## 2021-08-21 NOTE — Telephone Encounter (Signed)
Pt is calling to reschedule AWV on Monday 08/24/21 CB- [phone]

## 2021-08-21 NOTE — Telephone Encounter (Signed)
Please call and reschedule appointment per patient request.

## 2021-08-24 ENCOUNTER — Ambulatory Visit: Payer: Medicare Other

## 2021-08-24 DIAGNOSIS — M5136 Other intervertebral disc degeneration, lumbar region: Secondary | ICD-10-CM | POA: Diagnosis not present

## 2021-08-24 DIAGNOSIS — M5416 Radiculopathy, lumbar region: Secondary | ICD-10-CM | POA: Diagnosis not present

## 2021-08-24 NOTE — Telephone Encounter (Signed)
Patient is scheduled   

## 2021-08-25 DIAGNOSIS — M5412 Radiculopathy, cervical region: Secondary | ICD-10-CM | POA: Diagnosis not present

## 2021-08-25 DIAGNOSIS — M9903 Segmental and somatic dysfunction of lumbar region: Secondary | ICD-10-CM | POA: Diagnosis not present

## 2021-08-25 DIAGNOSIS — M9901 Segmental and somatic dysfunction of cervical region: Secondary | ICD-10-CM | POA: Diagnosis not present

## 2021-08-25 DIAGNOSIS — M5432 Sciatica, left side: Secondary | ICD-10-CM | POA: Diagnosis not present

## 2021-08-27 ENCOUNTER — Other Ambulatory Visit: Payer: Self-pay

## 2021-08-27 ENCOUNTER — Other Ambulatory Visit: Payer: Self-pay | Admitting: Family Medicine

## 2021-08-27 ENCOUNTER — Other Ambulatory Visit: Payer: Medicare Other

## 2021-08-27 DIAGNOSIS — E1165 Type 2 diabetes mellitus with hyperglycemia: Secondary | ICD-10-CM | POA: Diagnosis not present

## 2021-08-27 DIAGNOSIS — Z794 Long term (current) use of insulin: Secondary | ICD-10-CM

## 2021-08-27 LAB — BAYER DCA HB A1C WAIVED: HB A1C (BAYER DCA - WAIVED): 8.1 % — ABNORMAL HIGH (ref 4.8–5.6)

## 2021-08-28 ENCOUNTER — Telehealth (INDEPENDENT_AMBULATORY_CARE_PROVIDER_SITE_OTHER): Payer: Medicare Other | Admitting: Family Medicine

## 2021-08-28 ENCOUNTER — Ambulatory Visit (INDEPENDENT_AMBULATORY_CARE_PROVIDER_SITE_OTHER): Payer: Medicare Other

## 2021-08-28 ENCOUNTER — Encounter: Payer: Self-pay | Admitting: Family Medicine

## 2021-08-28 DIAGNOSIS — Z0000 Encounter for general adult medical examination without abnormal findings: Secondary | ICD-10-CM

## 2021-08-28 DIAGNOSIS — E1165 Type 2 diabetes mellitus with hyperglycemia: Secondary | ICD-10-CM | POA: Diagnosis not present

## 2021-08-28 DIAGNOSIS — Z794 Long term (current) use of insulin: Secondary | ICD-10-CM | POA: Diagnosis not present

## 2021-08-28 MED ORDER — ESCITALOPRAM OXALATE 20 MG PO TABS: 20.0000 mg | ORAL_TABLET | Freq: Every day | ORAL | 1 refills | Status: DC

## 2021-08-28 MED ORDER — TRULICITY 1.5 MG/0.5ML ~~LOC~~ SOAJ: 1.5000 mg | SUBCUTANEOUS | 1 refills | Status: DC

## 2021-08-28 MED ORDER — FREESTYLE LIBRE 14 DAY SENSOR MISC: 12 refills | Status: DC

## 2021-08-28 MED ORDER — METFORMIN HCL 1000 MG PO TABS: 1000.0000 mg | ORAL_TABLET | Freq: Two times a day (BID) | ORAL | 1 refills | Status: DC

## 2021-08-28 NOTE — Assessment & Plan Note (Signed)
Improved with A1c of 8.1 down from 8.3- had done worse with higher doses of trulicity. Will continue current regimen for now. Recheck 3 months. Call with any concerns.

## 2021-08-28 NOTE — Progress Notes (Signed)
LMP 03/17/2013 (Approximate)    Subjective:    Patient ID: Sierra Copeland, female    DOB: 06/13/1956, 65 y.o.   MRN: [id number]  HPI: Sierra Copeland is a 65 y.o. female  Chief Complaint  Patient presents with   Diabetes   DIABETES Hypoglycemic episodes:no Polydipsia/polyuria: no Visual disturbance: no Chest pain: no Paresthesias: yes Glucose Monitoring: yes  Accucheck frequency: continuous monitor Taking Insulin?: yes  Long acting insulin: 80 units   Short acting insulin: sliding scale Blood Pressure Monitoring: not checking Retinal Examination: Up to Date Foot Exam: Up to Date Diabetic Education: Completed Pneumovax: Up to Date Influenza: will get when she comes in Aspirin: no  Relevant past medical, surgical, family and social history reviewed and updated as indicated. Interim medical history since our last visit reviewed. Allergies and medications reviewed and updated.  Review of Systems  Constitutional: Negative.   Respiratory: Negative.    Cardiovascular: Negative.   Gastrointestinal: Negative.   Musculoskeletal: Negative.   Psychiatric/Behavioral: Negative.     Per HPI unless specifically indicated above     Objective:    LMP 03/17/2013 (Approximate)   Wt Readings from Last 3 Encounters:  05/28/21 224 lb (101.6 kg)  04/20/21 220 lb (99.8 kg)  02/24/21 228 lb 12.8 oz (103.8 kg)    Physical Exam Vitals and nursing note reviewed.  Constitutional:      General: She is not in acute distress.    Appearance: Normal appearance. She is not ill-appearing, toxic-appearing or diaphoretic.  HENT:     Head: Normocephalic and atraumatic.     Right Ear: External ear normal.     Left Ear: External ear normal.     Nose: Nose normal.     Mouth/Throat:     Mouth: Mucous membranes are moist.     Pharynx: Oropharynx is clear.  Eyes:     General: No scleral icterus.       Right eye: No discharge.        Left eye: No discharge.     Conjunctiva/sclera: Conjunctivae  normal.     Pupils: Pupils are equal, round, and reactive to light.  Pulmonary:     Effort: Pulmonary effort is normal. No respiratory distress.     Comments: Speaking in full sentences Musculoskeletal:        General: Normal range of motion.     Cervical back: Normal range of motion.  Skin:    Coloration: Skin is not jaundiced or pale.     Findings: No bruising, erythema, lesion or rash.  Neurological:     Mental Status: She is alert and oriented to person, place, and time. Mental status is at baseline.  Psychiatric:        Mood and Affect: Mood normal.        Behavior: Behavior normal.        Thought Content: Thought content normal.        Judgment: Judgment normal.    Results for orders placed or performed in visit on 08/27/21  Bayer DCA Hb A1c Waived  Result Value Ref Range   HB A1C (BAYER DCA - WAIVED) 8.1 (H) 4.8 - 5.6 %      Assessment & Plan:   Problem List Items Addressed This Visit       Endocrine   Type 2 diabetes mellitus with hyperglycemia ([place]) - Primary    Improved with A1c of 8.1 down from 8.3- had done worse with higher doses of trulicity. Will continue  current regimen for now. Recheck 3 months. Call with any concerns.       Relevant Medications   Dulaglutide (TRULICITY) 1.5 GY/1.7CB SOPN   metFORMIN (GLUCOPHAGE) 1000 MG tablet     Follow up plan: Return in about 3 months (around 11/27/2021), or physical.   This visit was completed via video visit through MyChart due to the restrictions of the COVID-19 pandemic. All issues as above were discussed and addressed. Physical exam was done as above through visual confirmation on video through MyChart. If it was felt that the patient should be evaluated in the office, they were directed there. The patient verbally consented to this visit. Location of the patient: home Location of the provider: home Those involved with this call:  Provider: Park Liter, DO CMA:  Marnette Burgess, CMA Front  Desk/Registration: Leota Jacobsen  Time spent on call:  15 minutes with patient face to face via video conference. More than 50% of this time was spent in counseling and coordination of care. 23 minutes total spent in review of patient's record and preparation of their chart.

## 2021-08-28 NOTE — Progress Notes (Signed)
Subjective:   Sierra Copeland is a 65 y.o. female who presents for an Initial Medicare Annual Wellness Visit.I connected with  Sierra Copeland on 08/28/21 by a audio enabled telemedicine application and verified that I am speaking with the correct person using two identifiers.   I discussed the limitations of evaluation and management by telemedicine. The patient expressed understanding and agreed to proceed.   Location of patient: home Location of provider: office [place] completed visit with Linus Galas, CMA  Review of Systems    Defer to PCP       Objective:    Today's Vitals   08/28/21 1714  PainSc: 7    There is no height or weight on file to calculate BMI.  Advanced Directives 08/28/2021 08/30/2017 05/13/2016 01/21/2016 09/28/2013  Does Patient Have a Medical Advance Directive? No Yes Yes Yes Patient has advance directive, copy not in chart  Type of Advance Directive - [place];Living will Living will;Healthcare Power of Attorney - -  Does patient want to make changes to medical advance directive? - No - Patient declined - - -  Copy of [place] in Chart? - No - copy requested No - copy requested - -  Would patient like information on creating a medical advance directive? No - Patient declined - - - -    Current Medications (verified) Outpatient Encounter Medications as of 08/28/2021  Medication Sig   amLODipine (NORVASC) 2.5 MG tablet Take 1 tablet (2.5 mg total) by mouth daily.   Cholecalciferol (VITAMIN D3) 2000 units capsule Take 2,000 Units by mouth at bedtime.    Coenzyme Q10 (COQ-10) 30 MG CAPS Take by mouth.   Continuous Blood Gluc Sensor (FREESTYLE LIBRE 14 DAY SENSOR) MISC 1 each by Does not apply route every 14 (fourteen) days.   Continuous Blood Gluc Sensor (FREESTYLE LIBRE 14 DAY SENSOR) MISC Use 1 Device as directed   Dulaglutide (TRULICITY) 1.5 YF/7.4BS SOPN Inject 1.5 mg into the skin once a week.   escitalopram (LEXAPRO) 20 MG  tablet Take 1 tablet (20 mg total) by mouth daily.   fexofenadine (ALLEGRA) 180 MG tablet Take 180 mg by mouth daily with breakfast.    fluticasone (FLONASE) 50 MCG/ACT nasal spray Place 1-2 sprays into both nostrils every evening.    folic acid (FOLVITE) 1 MG tablet    gabapentin (NEURONTIN) 100 MG capsule Take 1 capsule (100 mg total) by mouth 2 (two) times daily.   Glucagon (GVOKE HYPOPEN 2-PACK) 1 MG/0.2ML SOAJ Inject 1 mg into the skin as needed (hypoglycemia).   HUMALOG KWIKPEN 100 UNIT/ML KwikPen INJECT 10-20 UNITS INTO THE SKIN WITH BREAKFAST, WITH LUNCH AND WITH EVENING MEAL   Hyoscyamine Sulfate SL 0.125 MG SUBL Place under the tongue.   ibuprofen (ADVIL,MOTRIN) 200 MG tablet Take 800 mg by mouth every 8 (eight) hours as needed (for pain.).    insulin glargine, 1 Unit Dial, (TOUJEO SOLOSTAR) 300 UNIT/ML Solostar Pen 80 units at bedtime and 20 units at lunch time   Insulin Syringe-Needle U-100 30G X 5/16" 0.3 ML MISC    lisinopril (ZESTRIL) 20 MG tablet TAKE 1 TABLET BY MOUTH ONCE DAILY   LORazepam (ATIVAN) 0.5 MG tablet Take 1 tablet (0.5 mg total) by mouth daily as needed for anxiety. Take 1 30 minutes prior to biopsy, may repeat immediately before biopsy for anxiety   Magnesium 250 MG TABS Take by mouth.   metFORMIN (GLUCOPHAGE) 1000 MG tablet Take 1 tablet (1,000 mg  total) by mouth 2 (two) times daily with a meal.   methotrexate 50 MG/2ML injection 0.5 ml once weekly   omeprazole (PRILOSEC) 40 MG capsule TAKE 1 CAPSULE BY MOUTH ONCE DAILY   simvastatin (ZOCOR) 40 MG tablet TAKE 1 TABLET BY MOUTH AT BEDTIME   tiZANidine (ZANAFLEX) 2 MG tablet Take 2 mg by mouth at bedtime.    traMADol (ULTRAM) 50 MG tablet Take 100 mg by mouth at bedtime.    zinc gluconate 50 MG tablet Take 50 mg by mouth daily.   No facility-administered encounter medications on file as of 08/28/2021.    Allergies (verified) Prednisone   History: Past Medical History:  Diagnosis Date   Abnormal mammogram     Arthritis    Back pain    spinal stenosis and neck bone spur   Cancer (HCC)    Breast   Car sickness    Collagenous colitis    Depression    Diabetes mellitus without complication (HCC)    GERD (gastroesophageal reflux disease)    occ-no meds   Hair loss    TAKING THYROTAIN   Headache    migraines   Hyperlipemia    Hypertension    Osteoarthritis    seen by Rheum, not enoug evidence to support RA   Pinched nerve    PONV (postoperative nausea and vomiting)    after tonsillectomy   Rosacea    Seborrheic dermatitis    Sleep apnea    Wears glasses    Past Surgical History:  Procedure Laterality Date   ACHILLES TENDON REPAIR  2007   right   BREAST SURGERY  2011   lt br bx-non cancer   CARPAL TUNNEL RELEASE     right and left   CHOLECYSTECTOMY N/A 09/06/2017   Procedure: LAPAROSCOPY;  Surgeon: Leonie Green, MD;  Location: [facility];  Service: General;  Laterality: N/A;   COLONOSCOPY     epidural steroid injection x2  2017   ESOPHAGOGASTRODUODENOSCOPY N/A 01/21/2016   Procedure: ESOPHAGOGASTRODUODENOSCOPY (EGD);  Surgeon: Hulen Luster, MD;  Location: [place];  Service: Gastroenterology;  Laterality: N/A;  Please call at work [phone] Diabetic - insulin   FINGER EXPLORATION  2013   left long    MASTECTOMY Left    Partial   NERVE EXPLORATION Left 10/03/2013   Procedure: NERVE EXPLORATION/ LEFT LONG RADIAL DISTAL NERVE neurolysis;  Surgeon: Schuyler Amor, MD;  Location: [place];  Service: Orthopedics;  Laterality: Left;   SHOULDER ARTHROSCOPY WITH DEBRIDEMENT AND BICEP TENDON REPAIR Right 05/13/2016   Procedure: Arthroscopic debridement, open decompression, rotator cuff repair, tenodesis,right shoulder.;  Surgeon: Corky Mull, MD;  Location: [facility];  Service: Orthopedics;  Laterality: Right;   TONSILLECTOMY     UVULOPALATOPHARYNGOPLASTY  1998   and tonsils-tongue pexi   Family History  Problem Relation Age of Onset   Cancer  Mother        liver   Hypertension Mother    Hyperlipidemia Mother    Hyperlipidemia Father    Hypertension Father    Stroke Maternal Grandfather    Diabetes Paternal Grandmother    Dementia Maternal Grandmother    Heart disease Paternal Grandfather    Diabetes Maternal Aunt    Diabetes Maternal Uncle    Heart disease Maternal Uncle    Cancer Paternal Aunt        breast   Diabetes Paternal Aunt    Diabetes Paternal Uncle    Cancer Paternal Uncle  prostate   Cancer Paternal Aunt        breast   Social History   Socioeconomic History   Marital status: Married    Spouse name: Not on file   Number of children: Not on file   Years of education: Not on file   Highest education level: Not on file  Occupational History   Not on file  Tobacco Use   Smoking status: Former    Packs/day: 1.00    Years: 10.00    Pack years: 10.00    Types: Cigarettes    Quit date: 09/28/1986    Years since quitting: 34.9   Smokeless tobacco: Never  Vaping Use   Vaping Use: Never used  Substance and Sexual Activity   Alcohol use: No   Drug use: No   Sexual activity: Yes  Other Topics Concern   Not on file  Social History Narrative   Not on file   Social Determinants of Health   Financial Resource Strain: Low Risk    Difficulty of Paying Living Expenses: Not hard at all  Food Insecurity: No Food Insecurity   Worried About Charity fundraiser in the Last Year: Never true   [place] in the Last Year: Never true  Transportation Needs: No Transportation Needs   Lack of Transportation (Medical): No   Lack of Transportation (Non-Medical): No  Physical Activity: Inactive   Days of Exercise per Week: 0 days   Minutes of Exercise per Session: 0 min  Stress: No Stress Concern Present   Feeling of Stress : Not at all  Social Connections: Moderately Integrated   Frequency of Communication with Friends and Family: Twice a week   Frequency of Social Gatherings with Friends and  Family: Once a week   Attends Religious Services: More than 4 times per year   Active Member of [employer] or Organizations: No   Attends Music therapist: Never   Marital Status: Married    Tobacco Counseling Counseling given: Yes   Clinical Intake:  Pre-visit preparation completed: Yes  Pain : 0-10 Pain Score: 7  Pain Type: Chronic pain Pain Location: Head Pain Orientation: Lower, Upper, Lateral Pain Descriptors / Indicators: Constant, Dull, Sore Pain Onset: More than a month ago Pain Frequency: Constant Pain Relieving Factors: heat and medication Effect of Pain on Daily Activities: can not walk or sit for long makes it hard to get things done  Pain Relieving Factors: heat and medication  Nutritional Risks: None Diabetes: Yes CBG done?: No Did pt. bring in CBG monitor from home?: No  How often do you need to have someone help you when you read instructions, pamphlets, or other written materials from your doctor or pharmacy?: 1 - Never What is the last grade level you completed in school?: 1 year of college  Diabetic?yes  Interpreter Needed?: No  Information entered by :: Glenetta Hew   Activities of Daily Living No flowsheet data found.  Patient Care Team: Valerie Roys, DO as PCP - General (Family Medicine)  Indicate any recent Medical Services you may have received from other than [facility] providers in the past year (date may be approximate).     Assessment:   This is a routine wellness examination for [place].  Hearing/Vision screen No results found.  Dietary issues and exercise activities discussed:     Goals Addressed   None    Depression Screen [facility] 2/9 Scores 08/28/2021 04/20/2021 02/24/2021 02/04/2021 09/23/2020 08/20/2020 06/09/2020  PHQ -  2 Score 0 0 0 0 0 0 0  PHQ- 9 Score - 0 0 - 0 1 1    Fall Risk Fall Risk  02/04/2021 09/23/2020 02/02/2019 02/02/2019 06/09/2018  Falls in the past year? 0 0 0 0 No  Number falls in past yr: - 0 0 0 -   Injury with Fall? - 0 0 0 -  Risk for fall due to : - No Fall Risks - - -  Follow up - Falls evaluation completed - - -    FALL RISK PREVENTION PERTAINING TO THE HOME:  Any stairs in or around the home? No  If so, are there any without handrails? No  Home free of loose throw rugs in walkways, pet beds, electrical cords, etc? No  Adequate lighting in your home to reduce risk of falls? Yes   ASSISTIVE DEVICES UTILIZED TO PREVENT FALLS:  Life alert? No  Use of a cane, Aleph Nickson or w/c? No  Grab bars in the bathroom? No  Shower chair or bench in shower? Yes  Elevated toilet seat or a handicapped toilet? No   TIMED UP AND GO:  Was the test performed?  N/A .  Length of time to ambulate 10 feet: N/A sec.     Cognitive Function:     6CIT Screen 08/28/2021  What Year? 0 points  What month? 0 points  What time? 0 points  Count back from 20 0 points  Months in reverse 0 points  Repeat phrase 0 points  Total Score 0    Immunizations Immunization History  Administered Date(s) Administered   Influenza,inj,Quad PF,6+ Mos 09/03/2015, 08/31/2019, 09/23/2020   Influenza-Unspecified 08/08/2016, 09/07/2017, 09/13/2018   Pneumococcal Polysaccharide-23 11/29/2008   Tdap 12/04/2015    TDAP status: Up to date  Flu Vaccine status: Due, Education has been provided regarding the importance of this vaccine. Advised may receive this vaccine at local pharmacy or Health Dept. Aware to provide a copy of the vaccination record if obtained from local pharmacy or Health Dept. Verbalized acceptance and understanding.  Pneumococcal vaccine status: Up to date  Covid-19 vaccine status: Declined, Education has been provided regarding the importance of this vaccine but patient still declined. Advised may receive this vaccine at local pharmacy or Health Dept.or vaccine clinic. Aware to provide a copy of the vaccination record if obtained from local pharmacy or Health Dept. Verbalized acceptance and  understanding.  Qualifies for Shingles Vaccine? Yes   Zostavax completed No   Shingrix Completed?: No.    Education has been provided regarding the importance of this vaccine. Patient has been advised to call insurance company to determine out of pocket expense if they have not yet received this vaccine. Advised may also receive vaccine at local pharmacy or Health Dept. Verbalized acceptance and understanding.  Screening Tests Health Maintenance  Topic Date Due   COVID-19 Vaccine (1) Never done   Zoster Vaccines- Shingrix (1 of 2) Never done   COLONOSCOPY (Pts 45-57yrs Insurance coverage will need to be confirmed)  11/29/2018   INFLUENZA VACCINE  06/29/2021   HEMOGLOBIN A1C  02/24/2022   FOOT EXAM  05/28/2022   OPHTHALMOLOGY EXAM  08/10/2022   MAMMOGRAM  06/16/2023   PAP SMEAR-Modifier  02/02/2024   TETANUS/TDAP  12/03/2025   Hepatitis C Screening  Completed   HIV Screening  Completed   HPV VACCINES  Aged Out    Health Maintenance  Health Maintenance Due  Topic Date Due   COVID-19 Vaccine (1) Never done  Zoster Vaccines- Shingrix (1 of 2) Never done   COLONOSCOPY (Pts 45-45yrs Insurance coverage will need to be confirmed)  11/29/2018   INFLUENZA VACCINE  06/29/2021    Colorectal cancer screening: Type of screening: Colonoscopy. Completed not completed. Repeat every 10 years  Mammogram status: Completed 06/15/21. Repeat every year  Bone Density status: Ordered patient declined referral. Pt provided with contact info and advised to call to schedule appt.  Lung Cancer Screening: (Low Dose CT Chest recommended if Age 37-80 years, 30 pack-year currently smoking OR have quit w/in 15years.) does qualify.   Lung Cancer Screening Referral: no  Additional Screening:  Hepatitis C Screening: does not qualify; Completed   Vision Screening: Recommended annual ophthalmology exams for early detection of glaucoma and other disorders of the eye. Is the patient up to date with their  annual eye exam?  Yes  Who is the provider or what is the name of the office in which the patient attends annual eye exams? Dr. Jeni Salles If pt is not established with a provider, would they like to be referred to a provider to establish care?  N/A .   Dental Screening: Recommended annual dental exams for proper oral hygiene  Community Resource Referral / Chronic Care Management: CRR required this visit?  No   CCM required this visit?  No      Plan:     I have personally reviewed and noted the following in the patient's chart:   Medical and social history Use of alcohol, tobacco or illicit drugs  Current medications and supplements including opioid prescriptions. Patient is not currently taking opioid prescriptions. Functional ability and status Nutritional status Physical activity Advanced directives List of other physicians Hospitalizations, surgeries, and ER visits in previous 12 months Vitals Screenings to include cognitive, depression, and falls Referrals and appointments  In addition, I have reviewed and discussed with patient certain preventive protocols, quality metrics, and best practice recommendations. A written personalized care plan for preventive services as well as general preventive health recommendations were provided to patient.     Linus Galas, [place]   08/28/2021   Nurse Notes: non face to face 60 minutes Patient declined any referrals today  Ms. Griffin , Thank you for taking time to come for your Medicare Wellness Visit. I appreciate your ongoing commitment to your health goals. Please review the following plan we discussed and let me know if I can assist you in the future.   These are the goals we discussed:  Goals   None     This is a list of the screening recommended for you and due dates:  Health Maintenance  Topic Date Due   COVID-19 Vaccine (1) Never done   Zoster (Shingles) Vaccine (1 of 2) Never done   Colon Cancer Screening  11/29/2018    Flu Shot  06/29/2021   Hemoglobin A1C  02/24/2022   Complete foot exam   05/28/2022   Eye exam for diabetics  08/10/2022   Mammogram  06/16/2023   Pap Smear  02/02/2024   Tetanus Vaccine  12/03/2025   Hepatitis C Screening: USPSTF Recommendation to screen - Ages 18-79 yo.  Completed   HIV Screening  Completed   HPV Vaccine  Aged Out

## 2021-08-28 NOTE — Patient Instructions (Signed)
Health Maintenance, Female Adopting a healthy lifestyle and getting preventive care are important in promoting health and wellness. Ask your health care provider about: The right schedule for you to have regular tests and exams. Things you can do on your own to prevent diseases and keep yourself healthy. What should I know about diet, weight, and exercise? Eat a healthy diet  Eat a diet that includes plenty of vegetables, fruits, low-fat dairy products, and lean protein. Do not eat a lot of foods that are high in solid fats, added sugars, or sodium. Maintain a healthy weight Body mass index (BMI) is used to identify weight problems. It estimates body fat based on height and weight. Your health care provider can help determine your BMI and help you achieve or maintain a healthy weight. Get regular exercise Get regular exercise. This is one of the most important things you can do for your health. Most adults should: Exercise for at least 150 minutes each week. The exercise should increase your heart rate and make you sweat (moderate-intensity exercise). Do strengthening exercises at least twice a week. This is in addition to the moderate-intensity exercise. Spend less time sitting. Even light physical activity can be beneficial. Watch cholesterol and blood lipids Have your blood tested for lipids and cholesterol at 64 years of age, then have this test every 5 years. Have your cholesterol levels checked more often if: Your lipid or cholesterol levels are high. You are older than 64 years of age. You are at high risk for heart disease. What should I know about cancer screening? Depending on your health history and family history, you may need to have cancer screening at various ages. This may include screening for: Breast cancer. Cervical cancer. Colorectal cancer. Skin cancer. Lung cancer. What should I know about heart disease, diabetes, and high blood pressure? Blood pressure and heart  disease High blood pressure causes heart disease and increases the risk of stroke. This is more likely to develop in people who have high blood pressure readings, are of African descent, or are overweight. Have your blood pressure checked: Every 3-5 years if you are 18-39 years of age. Every year if you are 40 years old or older. Diabetes Have regular diabetes screenings. This checks your fasting blood sugar level. Have the screening done: Once every three years after age 40 if you are at a normal weight and have a low risk for diabetes. More often and at a younger age if you are overweight or have a high risk for diabetes. What should I know about preventing infection? Hepatitis B If you have a higher risk for hepatitis B, you should be screened for this virus. Talk with your health care provider to find out if you are at risk for hepatitis B infection. Hepatitis C Testing is recommended for: Everyone born from 1945 through 1965. Anyone with known risk factors for hepatitis C. Sexually transmitted infections (STIs) Get screened for STIs, including gonorrhea and chlamydia, if: You are sexually active and are younger than 64 years of age. You are older than 64 years of age and your health care provider tells you that you are at risk for this type of infection. Your sexual activity has changed since you were last screened, and you are at increased risk for chlamydia or gonorrhea. Ask your health care provider if you are at risk. Ask your health care provider about whether you are at high risk for HIV. Your health care provider may recommend a prescription medicine   to help prevent HIV infection. If you choose to take medicine to prevent HIV, you should first get tested for HIV. You should then be tested every 3 months for as long as you are taking the medicine. Pregnancy If you are about to stop having your period (premenopausal) and you may become pregnant, seek counseling before you get  pregnant. Take 400 to 800 micrograms (mcg) of folic acid every day if you become pregnant. Ask for birth control (contraception) if you want to prevent pregnancy. Osteoporosis and menopause Osteoporosis is a disease in which the bones lose minerals and strength with aging. This can result in bone fractures. If you are 65 years old or older, or if you are at risk for osteoporosis and fractures, ask your health care provider if you should: Be screened for bone loss. Take a calcium or vitamin D supplement to lower your risk of fractures. Be given hormone replacement therapy (HRT) to treat symptoms of menopause. Follow these instructions at home: Lifestyle Do not use any products that contain nicotine or tobacco, such as cigarettes, e-cigarettes, and chewing tobacco. If you need help quitting, ask your health care provider. Do not use street drugs. Do not share needles. Ask your health care provider for help if you need support or information about quitting drugs. Alcohol use Do not drink alcohol if: Your health care provider tells you not to drink. You are pregnant, may be pregnant, or are planning to become pregnant. If you drink alcohol: Limit how much you use to 0-1 drink a day. Limit intake if you are breastfeeding. Be aware of how much alcohol is in your drink. In the [place] one drink equals one 12 oz bottle of beer (355 mL), one 5 oz glass of wine (148 mL), or one 1 oz glass of hard liquor (44 mL). General instructions Schedule regular health, dental, and eye exams. Stay current with your vaccines. Tell your health care provider if: You often feel depressed. You have ever been abused or do not feel safe at home. Summary Adopting a healthy lifestyle and getting preventive care are important in promoting health and wellness. Follow your health care provider's instructions about healthy diet, exercising, and getting tested or screened for diseases. Follow your health care provider's  instructions on monitoring your cholesterol and blood pressure. This information is not intended to replace advice given to you by your health care provider. Make sure you discuss any questions you have with your health care provider. Document Revised: 01/23/2021 Document Reviewed: 11/08/2018 Elsevier Patient Education  2022 Elsevier Inc.  

## 2021-09-14 DIAGNOSIS — M159 Polyosteoarthritis, unspecified: Secondary | ICD-10-CM | POA: Diagnosis not present

## 2021-09-14 DIAGNOSIS — M059 Rheumatoid arthritis with rheumatoid factor, unspecified: Secondary | ICD-10-CM | POA: Diagnosis not present

## 2021-09-14 DIAGNOSIS — M5136 Other intervertebral disc degeneration, lumbar region: Secondary | ICD-10-CM | POA: Diagnosis not present

## 2021-09-24 ENCOUNTER — Ambulatory Visit: Payer: Medicare Other | Admitting: Registered Nurse

## 2021-09-24 ENCOUNTER — Ambulatory Visit
Admission: RE | Admit: 2021-09-24 | Discharge: 2021-09-24 | Disposition: A | Discharge status: Home / Self Care | Payer: Medicare Other | Attending: Gastroenterology | Admitting: Gastroenterology

## 2021-09-24 ENCOUNTER — Encounter
Admission: RE | Disposition: A | Discharge status: Home / Self Care | Payer: Self-pay | Source: Home / Self Care | Attending: Gastroenterology

## 2021-09-24 ENCOUNTER — Other Ambulatory Visit: Payer: Self-pay

## 2021-09-24 ENCOUNTER — Encounter: Payer: Self-pay | Admitting: Gastroenterology

## 2021-09-24 DIAGNOSIS — K219 Gastro-esophageal reflux disease without esophagitis: Secondary | ICD-10-CM | POA: Diagnosis not present

## 2021-09-24 DIAGNOSIS — Z794 Long term (current) use of insulin: Secondary | ICD-10-CM | POA: Insufficient documentation

## 2021-09-24 DIAGNOSIS — K224 Dyskinesia of esophagus: Secondary | ICD-10-CM | POA: Insufficient documentation

## 2021-09-24 DIAGNOSIS — Z1211 Encounter for screening for malignant neoplasm of colon: Secondary | ICD-10-CM | POA: Diagnosis not present

## 2021-09-24 DIAGNOSIS — Z86010 Personal history of colonic polyps: Secondary | ICD-10-CM | POA: Diagnosis not present

## 2021-09-24 DIAGNOSIS — K635 Polyp of colon: Secondary | ICD-10-CM | POA: Diagnosis not present

## 2021-09-24 DIAGNOSIS — D125 Benign neoplasm of sigmoid colon: Secondary | ICD-10-CM | POA: Diagnosis not present

## 2021-09-24 DIAGNOSIS — E7800 Pure hypercholesterolemia, unspecified: Secondary | ICD-10-CM | POA: Diagnosis not present

## 2021-09-24 DIAGNOSIS — K640 First degree hemorrhoids: Secondary | ICD-10-CM | POA: Insufficient documentation

## 2021-09-24 DIAGNOSIS — D123 Benign neoplasm of transverse colon: Secondary | ICD-10-CM | POA: Insufficient documentation

## 2021-09-24 DIAGNOSIS — D122 Benign neoplasm of ascending colon: Secondary | ICD-10-CM | POA: Insufficient documentation

## 2021-09-24 DIAGNOSIS — D72820 Lymphocytosis (symptomatic): Secondary | ICD-10-CM | POA: Diagnosis not present

## 2021-09-24 DIAGNOSIS — Z888 Allergy status to other drugs, medicaments and biological substances status: Secondary | ICD-10-CM | POA: Insufficient documentation

## 2021-09-24 DIAGNOSIS — K209 Esophagitis, unspecified without bleeding: Secondary | ICD-10-CM | POA: Diagnosis not present

## 2021-09-24 DIAGNOSIS — K5730 Diverticulosis of large intestine without perforation or abscess without bleeding: Secondary | ICD-10-CM | POA: Insufficient documentation

## 2021-09-24 DIAGNOSIS — Z79899 Other long term (current) drug therapy: Secondary | ICD-10-CM | POA: Insufficient documentation

## 2021-09-24 DIAGNOSIS — Z79891 Long term (current) use of opiate analgesic: Secondary | ICD-10-CM | POA: Diagnosis not present

## 2021-09-24 DIAGNOSIS — D120 Benign neoplasm of cecum: Secondary | ICD-10-CM | POA: Insufficient documentation

## 2021-09-24 DIAGNOSIS — K3184 Gastroparesis: Secondary | ICD-10-CM | POA: Diagnosis not present

## 2021-09-24 DIAGNOSIS — K2970 Gastritis, unspecified, without bleeding: Secondary | ICD-10-CM | POA: Insufficient documentation

## 2021-09-24 DIAGNOSIS — K529 Noninfective gastroenteritis and colitis, unspecified: Secondary | ICD-10-CM | POA: Diagnosis not present

## 2021-09-24 DIAGNOSIS — R1310 Dysphagia, unspecified: Secondary | ICD-10-CM | POA: Diagnosis present

## 2021-09-24 DIAGNOSIS — D128 Benign neoplasm of rectum: Secondary | ICD-10-CM | POA: Diagnosis not present

## 2021-09-24 DIAGNOSIS — K208 Other esophagitis without bleeding: Secondary | ICD-10-CM | POA: Diagnosis not present

## 2021-09-24 DIAGNOSIS — D126 Benign neoplasm of colon, unspecified: Secondary | ICD-10-CM | POA: Diagnosis not present

## 2021-09-24 DIAGNOSIS — K649 Unspecified hemorrhoids: Secondary | ICD-10-CM | POA: Diagnosis not present

## 2021-09-24 DIAGNOSIS — R197 Diarrhea, unspecified: Secondary | ICD-10-CM | POA: Diagnosis not present

## 2021-09-24 HISTORY — PX: COLONOSCOPY WITH PROPOFOL: SHX5780

## 2021-09-24 HISTORY — PX: ESOPHAGOGASTRODUODENOSCOPY: SHX5428

## 2021-09-24 LAB — GLUCOSE, CAPILLARY: Glucose-Capillary: 265 mg/dL — ABNORMAL HIGH (ref 70–99)

## 2021-09-24 SURGERY — COLONOSCOPY WITH PROPOFOL
Anesthesia: General

## 2021-09-24 SURGERY — COLONOSCOPY
Anesthesia: General

## 2021-09-24 MED ORDER — LIDOCAINE HCL (CARDIAC) PF 100 MG/5ML IV SOSY
PREFILLED_SYRINGE | INTRAVENOUS | Status: DC | PRN
  Administered 2021-09-24: 100 mg via INTRAVENOUS

## 2021-09-24 MED ORDER — PHENYLEPHRINE HCL (PRESSORS) 10 MG/ML IV SOLN
INTRAVENOUS | Status: AC
  Filled 2021-09-24: qty 1

## 2021-09-24 MED ORDER — SODIUM CHLORIDE 0.9 % IV SOLN: INTRAVENOUS | Status: DC

## 2021-09-24 MED ORDER — PHENYLEPHRINE HCL (PRESSORS) 10 MG/ML IV SOLN
INTRAVENOUS | Status: DC | PRN
  Administered 2021-09-24: 100 ug via INTRAVENOUS

## 2021-09-24 MED ORDER — FENTANYL CITRATE (PF) 100 MCG/2ML IJ SOLN
INTRAMUSCULAR | Status: DC | PRN
  Administered 2021-09-24: 50 ug via INTRAVENOUS
  Administered 2021-09-24 (×2): 25 ug via INTRAVENOUS

## 2021-09-24 MED ORDER — PROPOFOL 500 MG/50ML IV EMUL
INTRAVENOUS | Status: AC
  Filled 2021-09-24: qty 50

## 2021-09-24 MED ORDER — ONDANSETRON HCL 4 MG/2ML IJ SOLN
INTRAMUSCULAR | Status: DC | PRN
  Administered 2021-09-24: 4 mg via INTRAVENOUS

## 2021-09-24 MED ORDER — PROPOFOL 10 MG/ML IV BOLUS
INTRAVENOUS | Status: DC | PRN
  Administered 2021-09-24: 100 mg via INTRAVENOUS
  Administered 2021-09-24: 40 mg via INTRAVENOUS

## 2021-09-24 MED ORDER — FENTANYL CITRATE (PF) 100 MCG/2ML IJ SOLN
INTRAMUSCULAR | Status: AC
  Filled 2021-09-24: qty 2

## 2021-09-24 MED ORDER — GLYCOPYRROLATE 0.2 MG/ML IJ SOLN
INTRAMUSCULAR | Status: DC | PRN
  Administered 2021-09-24: .2 mg via INTRAVENOUS

## 2021-09-24 MED ORDER — PROPOFOL 500 MG/50ML IV EMUL
INTRAVENOUS | Status: DC | PRN
  Administered 2021-09-24: 120 ug/kg/min via INTRAVENOUS

## 2021-09-24 NOTE — H&P (Signed)
[facility] Pre-Procedure H&P   Patient ID: Sierra Copeland is a 65 y.o. female.  Gastroenterology Provider: Annamaria Helling, DO  Referring Provider: Laurine Blazer, PA PCP: Valerie Roys, DO  Date: 09/24/2021  HPI Sierra Copeland is a 65 y.o. 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  Pt notes esophageal spasm to both liquids and stool. Notes mostly with dry foods. No n/v. GERD controlled with prilosec. Denies abdominal pain.  Chronic diarrhea- has h/o collagenous colitis. Diarrheal sx worse with mtx, metformin, and trulicity. Also notably on excedrin, simvastatin, iron, tramadol, and paxil, lexapro, and ibuprofen per office med list. Attempted cholecystectomy in the past, however, due to dense adhesions, this was nto removed.  Colonoscopy 2015- polyps 2017 EGD- gastritis negative for HP; dilated with 50 fr maloney  Past Medical History:  Diagnosis Date   Abnormal mammogram    Arthritis    Back pain    spinal stenosis and neck bone spur   Cancer ([place])    Breast   Car sickness    Collagenous colitis    Depression    Diabetes mellitus without complication (HCC)    GERD (gastroesophageal reflux disease)    occ-no meds   Hair loss    TAKING THYROTAIN   Headache    migraines   Hyperlipemia    Hypertension    Osteoarthritis    seen by Rheum, not enoug evidence to support RA   Pinched nerve    PONV (postoperative nausea and vomiting)    after tonsillectomy   Rosacea    Seborrheic dermatitis    Sleep apnea    Wears glasses     Past Surgical History:  Procedure Laterality Date   ACHILLES TENDON REPAIR  2007   right   BREAST SURGERY  2011   lt br bx-non cancer   CARPAL TUNNEL RELEASE     right and left   CHOLECYSTECTOMY N/A 09/06/2017   Procedure: LAPAROSCOPY;  Surgeon: Leonie Green, MD;  Location: [facility];  Service: General;   Laterality: N/A;   COLONOSCOPY     epidural steroid injection x2  2017   ESOPHAGOGASTRODUODENOSCOPY N/A 01/21/2016   Procedure: ESOPHAGOGASTRODUODENOSCOPY (EGD);  Surgeon: Hulen Luster, MD;  Location: [place];  Service: Gastroenterology;  Laterality: N/A;  Please call at work [phone] Diabetic - insulin   FINGER EXPLORATION  2013   left long    MASTECTOMY Left    Partial   NERVE EXPLORATION Left 10/03/2013   Procedure: NERVE EXPLORATION/ LEFT LONG RADIAL DISTAL NERVE neurolysis;  Surgeon: Schuyler Amor, MD;  [facility];  Service: Orthopedics;  Laterality: Left;   SHOULDER ARTHROSCOPY WITH DEBRIDEMENT AND BICEP TENDON REPAIR Right 05/13/2016   Procedure: Arthroscopic debridement, open decompression, rotator cuff repair, tenodesis,right shoulder.;  Surgeon: Corky Mull, MD;  Location: [facility];  Service: Orthopedics;  Laterality: Right;   TONSILLECTOMY     UVULOPALATOPHARYNGOPLASTY  1998   and tonsils-tongue pexi    Family History No h/o GI disease or malignancy  Review of Systems  Constitutional:  Negative for activity change, appetite change, fatigue, fever and unexpected weight change.  HENT:  Positive for trouble swallowing. Negative for voice change.   Respiratory:  Negative for shortness of breath and wheezing.   Cardiovascular:  Negative for chest pain and palpitations.  Gastrointestinal:  Positive for abdominal pain and diarrhea. Negative for abdominal distention, anal bleeding, blood  in stool, constipation, nausea, rectal pain and vomiting.  Musculoskeletal:  Negative for arthralgias and myalgias.  Skin:  Negative for color change and pallor.  Neurological:  Negative for dizziness, syncope and weakness.  Psychiatric/Behavioral:  Negative for confusion.   All other systems reviewed and are negative.   Medications No current facility-administered medications on file prior to encounter.   Current Outpatient Medications on File Prior to  Encounter  Medication Sig Dispense Refill   Cholecalciferol (VITAMIN D3) 2000 units capsule Take 2,000 Units by mouth at bedtime.      Coenzyme Q10 (COQ-10) 30 MG CAPS Take by mouth.     fexofenadine (ALLEGRA) 180 MG tablet Take 180 mg by mouth daily with breakfast.      fluticasone (FLONASE) 50 MCG/ACT nasal spray Place 1-2 sprays into both nostrils every evening.      ibuprofen (ADVIL,MOTRIN) 200 MG tablet Take 800 mg by mouth every 8 (eight) hours as needed (for pain.).      insulin glargine, 1 Unit Dial, (TOUJEO SOLOSTAR) 300 UNIT/ML Solostar Pen 80 units at bedtime and 20 units at lunch time 10 mL 12   lisinopril (ZESTRIL) 20 MG tablet TAKE 1 TABLET BY MOUTH ONCE DAILY 90 tablet 1   LORazepam (ATIVAN) 0.5 MG tablet Take 1 tablet (0.5 mg total) by mouth daily as needed for anxiety. Take 1 30 minutes prior to biopsy, may repeat immediately before biopsy for anxiety 20 tablet 0   Magnesium 250 MG TABS Take by mouth.     simvastatin (ZOCOR) 40 MG tablet TAKE 1 TABLET BY MOUTH AT BEDTIME 90 tablet 1   tiZANidine (ZANAFLEX) 2 MG tablet Take 2 mg by mouth at bedtime.      traMADol (ULTRAM) 50 MG tablet Take 100 mg by mouth at bedtime.      zinc gluconate 50 MG tablet Take 50 mg by mouth daily.     folic acid (FOLVITE) 1 MG tablet      Glucagon (GVOKE HYPOPEN 2-PACK) 1 MG/0.2ML SOAJ Inject 1 mg into the skin as needed (hypoglycemia). 0.4 mL 12   Insulin Syringe-Needle U-100 30G X 5/16" 0.3 ML MISC      methotrexate 50 MG/2ML injection 0.5 ml once weekly (Patient not taking: Reported on 09/24/2021)      Pertinent medications related to GI and procedure were reviewed by me with the patient prior to the procedure   Current Facility-Administered Medications:    0.9 %  sodium chloride infusion, , Intravenous, Continuous, [place], Benay Pike, MD  sodium chloride         Allergies  Allergen Reactions   Prednisone Palpitations    Increases BG and HR.   Allergies were reviewed by me prior to the  procedure  Objective    Vitals:   09/24/21 0702  BP: (!) 154/104  Pulse: 95  Resp: 16  Temp: (!) 97.2 F (36.2 C)  TempSrc: Temporal  SpO2: 98%  Weight: 99.8 kg  Height: 5\' 1"  (1.549 m)    Physical Exam Vitals reviewed.  Constitutional:      General: She is not in acute distress.    Appearance: Normal appearance. She is obese. She is not ill-appearing, toxic-appearing or diaphoretic.  HENT:     Head: Normocephalic and atraumatic.     Nose: Nose normal.     Mouth/Throat:     Mouth: Mucous membranes are moist.     Pharynx: Oropharynx is clear.  Eyes:     General: No scleral icterus.  Extraocular Movements: Extraocular movements intact.  Cardiovascular:     Rate and Rhythm: Normal rate and regular rhythm.     Heart sounds: Normal heart sounds. No murmur heard.   No friction rub. No gallop.  Pulmonary:     Effort: Pulmonary effort is normal. No respiratory distress.     Breath sounds: Normal breath sounds. No wheezing, rhonchi or rales.  Abdominal:     General: Bowel sounds are normal. There is no distension.     Palpations: Abdomen is soft.     Tenderness: There is no abdominal tenderness. There is no guarding or rebound.  Musculoskeletal:     Cervical back: Neck supple.     Right lower leg: No edema.     Left lower leg: No edema.  Skin:    General: Skin is warm and dry.     Coloration: Skin is not jaundiced or pale.  Neurological:     General: No focal deficit present.     Mental Status: She is alert and oriented to person, place, and time. Mental status is at baseline.  Psychiatric:        Mood and Affect: Mood normal.        Behavior: Behavior normal.        Thought Content: Thought content normal.        Judgment: Judgment normal.     Assessment:  Ms. DEHLIA KILNER is a 65 y.o. female  who presents today for Esophagogastroduodenoscopy and Colonoscopy for esophageal spasm, chronic diarrhea with history of collagenous colitis.  Plan:   Esophagogastroduodenoscopy and Colonoscopy with possible intervention today  Esophagogastroduodenoscopy and colonoscopy with possible biopsy, control of bleeding, polypectomy, and interventions as necessary has been discussed with the patient/patient representative. Informed consent was obtained from the patient/patient representative after explaining the indication, nature, and risks of the procedure including but not limited to death, bleeding, perforation, missed neoplasm/lesions, cardiorespiratory compromise, and reaction to medications. Opportunity for questions was given and appropriate answers were provided. Patient/patient representative has verbalized understanding is amenable to undergoing the procedure.   Annamaria Helling, DO  [facility]  Portions of the record may have been created with voice recognition software. Occasional wrong-word or 'sound-a-like' substitutions may have occurred due to the inherent limitations of voice recognition software.  Read the chart carefully and recognize, using context, where substitutions may have occurred.

## 2021-09-24 NOTE — Op Note (Signed)
[facility] Gastroenterology Patient Name: Sierra Copeland Procedure Date: 09/24/2021 7:24 AM MRN: [id number] Account #: [id number] Date of Birth: 1956-01-18 Admit Type: Outpatient Age: 65 Room: [facility] ENDO ROOM 1 Gender: Female Note Status: Finalized Instrument Name: Colonscope [id number] Procedure:             Colonoscopy Indications:           High risk colon cancer surveillance: Personal history                         of colonic polyps, Incidental - Chronic diarrhea Providers:             Annamaria Helling DO, DO Referring MD:          Valerie Roys (Referring MD) Medicines:             Monitored Anesthesia Care Complications:         No immediate complications. Estimated blood loss:                         Minimal. Procedure:             Pre-Anesthesia Assessment:                        - Prior to the procedure, a History and Physical was                         performed, and patient medications and allergies were                         reviewed. The patient is competent. The risks and                         benefits of the procedure and the sedation options and                         risks were discussed with the patient. All questions                         were answered and informed consent was obtained.                         Patient identification and proposed procedure were                         verified by the physician, the nurse, the anesthetist                         and the technician in the endoscopy suite. Mental                         Status Examination: alert and oriented. Airway                         Examination: normal oropharyngeal airway and neck                         mobility. Respiratory Examination: clear to  auscultation. CV Examination: RRR, no murmurs, no S3                         or S4. Prophylactic Antibiotics: The patient does not                         require prophylactic antibiotics. Prior                          Anticoagulants: The patient has taken no previous                         anticoagulant or antiplatelet agents. ASA Grade                         Assessment: III - A patient with severe systemic                         disease. After reviewing the risks and benefits, the                         patient was deemed in satisfactory condition to                         undergo the procedure. The anesthesia plan was to use                         monitored anesthesia care (MAC). Immediately prior to                         administration of medications, the patient was                         re-assessed for adequacy to receive sedatives. The                         heart rate, respiratory rate, oxygen saturations,                         blood pressure, adequacy of pulmonary ventilation, and                         response to care were monitored throughout the                         procedure. The physical status of the patient was                         re-assessed after the procedure.                        After obtaining informed consent, the colonoscope was                         passed under direct vision. Throughout the procedure,                         the patient's blood pressure, pulse, and oxygen  saturations were monitored continuously. The                         Colonoscope was introduced through the anus and                         advanced to the the terminal ileum, with                         identification of the appendiceal orifice and IC                         valve. The colonoscopy was performed without                         difficulty. The patient tolerated the procedure well.                         The quality of the bowel preparation was evaluated                         using the BBPS [facility] Bowel Preparation Scale) with                         scores of: Right Colon = 3, Transverse Colon = 3 and                          Left Colon = 3 (entire mucosa seen well with no                         residual staining, small fragments of stool or opaque                         liquid). The total BBPS score equals 9. The terminal                         ileum, ileocecal valve, appendiceal orifice, and                         rectum were photographed. Findings:      The perianal and digital rectal examinations were normal. Pertinent       negatives include normal sphincter tone.      The terminal ileum appeared normal.      Multiple small-mouthed diverticula were found in the left colon.       Estimated blood loss: none.      Non-bleeding internal hemorrhoids were found during retroflexion. The       hemorrhoids were Grade I (internal hemorrhoids that do not prolapse).       Estimated blood loss: none.      Normal mucosa was found in the entire colon. Biopsies for histology were       taken with a cold forceps from the right colon and left colon for       evaluation of microscopic colitis. Estimated blood loss was minimal.      Two sessile polyps were found in the ascending colon. The polyps were 3       to 4 mm in size. These polyps were removed with a  jumbo cold forceps.       Resection and retrieval were complete. Estimated blood loss was minimal.      A 6 to 7 mm polyp was found in the transverse colon. The polyp was       sessile. The polyp was removed with a cold snare. Resection and       retrieval were complete. Estimated blood loss was minimal.      A 3 to 4 mm polyp was found in the transverse colon. The polyp was       sessile. The polyp was removed with a jumbo cold forceps. Resection and       retrieval were complete. Estimated blood loss was minimal.      A 5 to 6 mm polyp was found in the sigmoid colon. The polyp was sessile.       The polyp was removed with a cold snare. Resection and retrieval were       complete. Estimated blood loss was minimal.      A 7 to 8 mm polyp was found in the  rectum. The polyp was       semi-pedunculated. The polyp was removed with a cold snare. Resection       and retrieval were complete. Estimated blood loss was minimal.      The exam was otherwise without abnormality on direct and retroflexion       views.      A 2 to 3 mm polyp was found in the cecum. The polyp was sessile. The       polyp was removed with a jumbo cold forceps. Resection and retrieval       were complete. Estimated blood loss was minimal. Impression:            - The examined portion of the ileum was normal.                        - Diverticulosis in the left colon.                        - Non-bleeding internal hemorrhoids.                        - Normal mucosa in the entire examined colon. Biopsied.                        - Two 3 to 4 mm polyps in the ascending colon, removed                         with a jumbo cold forceps. Resected and retrieved.                        - One 6 to 7 mm polyp in the transverse colon, removed                         with a cold snare. Resected and retrieved.                        - One 3 to 4 mm polyp in the transverse colon, removed                         with a  jumbo cold forceps. Resected and retrieved.                        - One 5 to 6 mm polyp in the sigmoid colon, removed                         with a cold snare. Resected and retrieved.                        - One 7 to 8 mm polyp in the rectum, removed with a                         cold snare. Resected and retrieved.                        - The examination was otherwise normal on direct and                         retroflexion views.                        - One 2 to 3 mm, non-bleeding polyp in the cecum,                         removed with a jumbo cold forceps. Resected and                         retrieved. Recommendation:        - Discharge patient to home.                        - Resume previous diet.                        - No aspirin, ibuprofen, naproxen, or  other                         non-steroidal anti-inflammatory drugs for 5 days after                         polyp removal.                        - Continue present medications.                        - Await pathology results.                        - Repeat colonoscopy for surveillance based on                         pathology results.                        - Return to referring physician as previously                         scheduled. Procedure Code(s):     --- Professional ---  45385, Colonoscopy, flexible; with removal of                         tumor(s), polyp(s), or other lesion(s) by snare                         technique                        45380, 59, Colonoscopy, flexible; with biopsy, single                         or multiple Diagnosis Code(s):     --- Professional ---                        Z86.010, Personal history of colonic polyps                        K64.0, First degree hemorrhoids                        K63.5, Polyp of colon                        K62.1, Rectal polyp                        K57.30, Diverticulosis of large intestine without                         perforation or abscess without bleeding CPT copyright 2019 American [employer] Association. All rights reserved. The codes documented in this report are preliminary and upon coder review may  be revised to meet current compliance requirements. Attending Participation:      I personally performed the entire procedure. Volney American, DO Annamaria Helling DO, DO 09/24/2021 8:40:56 AM This report has been signed electronically. Number of Addenda: 0 Note Initiated On: 09/24/2021 7:24 AM Scope Withdrawal Time: 0 hours 23 minutes 4 seconds  Total Procedure Duration: 0 hours 26 minutes 48 seconds  Estimated Blood Loss:  Estimated blood loss was minimal.      [facility]

## 2021-09-24 NOTE — Anesthesia Postprocedure Evaluation (Signed)
Anesthesia Post Note  Patient: Sierra Copeland  Procedure(s) Performed: COLONOSCOPY WITH PROPOFOL ESOPHAGOGASTRODUODENOSCOPY (EGD)  Patient location during evaluation: PACU Anesthesia Type: General Level of consciousness: awake and oriented Vital Signs Assessment: post-procedure vital signs reviewed and stable Respiratory status: respiratory function stable Cardiovascular status: blood pressure returned to baseline Anesthetic complications: no   No notable events documented.   Last Vitals:  Vitals:   09/24/21 0835 09/24/21 0845  BP: 128/70 134/69  Pulse: (!) 101 93  Resp: 19 13  Temp:    SpO2: 94% 92%    Last Pain:  Vitals:   09/24/21 0835  TempSrc:   PainSc: 0-No pain                 VAN STAVEREN,Lache Dagher

## 2021-09-24 NOTE — Op Note (Addendum)
[facility] Gastroenterology Patient Name: Sierra Copeland Procedure Date: 09/24/2021 7:30 AM MRN: [id number] Account #: [id number] Date of Birth: 1956-10-28 Admit Type: Outpatient Age: 65 Room: [facility] ENDO ROOM 1 Gender: Female Note Status: Finalized Instrument Name: Upper Endoscope [id number] Procedure:             Upper GI endoscopy Indications:           Dysphagia Providers:             Annamaria Helling DO, DO Referring MD:          Valerie Roys (Referring MD) Medicines:             Monitored Anesthesia Care Complications:         No immediate complications. Estimated blood loss:                         Minimal. Procedure:             Pre-Anesthesia Assessment:                        - Prior to the procedure, a History and Physical was                         performed, and patient medications and allergies were                         reviewed. The patient is competent. The risks and                         benefits of the procedure and the sedation options and                         risks were discussed with the patient. All questions                         were answered and informed consent was obtained.                         Patient identification and proposed procedure were                         verified by the physician, the nurse, the anesthetist                         and the technician in the endoscopy suite. Mental                         Status Examination: alert and oriented. Airway                         Examination: normal oropharyngeal airway and neck                         mobility. Respiratory Examination: clear to                         auscultation. CV Examination: RRR, no murmurs, no S3  or S4. Prophylactic Antibiotics: The patient does not                         require prophylactic antibiotics. Prior                         Anticoagulants: The patient has taken no previous                          anticoagulant or antiplatelet agents. ASA Grade                         Assessment: III - A patient with severe systemic                         disease. After reviewing the risks and benefits, the                         patient was deemed in satisfactory condition to                         undergo the procedure. The anesthesia plan was to use                         monitored anesthesia care (MAC). Immediately prior to                         administration of medications, the patient was                         re-assessed for adequacy to receive sedatives. The                         heart rate, respiratory rate, oxygen saturations,                         blood pressure, adequacy of pulmonary ventilation, and                         response to care were monitored throughout the                         procedure. The physical status of the patient was                         re-assessed after the procedure.                        After obtaining informed consent, the endoscope was                         passed under direct vision. Throughout the procedure,                         the patient's blood pressure, pulse, and oxygen                         saturations were monitored continuously. The Endoscope  was introduced through the mouth, and advanced to the                         second part of duodenum. The upper GI endoscopy was                         accomplished without difficulty. The patient tolerated                         the procedure well. Findings:      The duodenal bulb, first portion of the duodenum and second portion of       the duodenum were normal. Estimated blood loss: none.      Localized mild inflammation characterized by erythema was found in the       gastric antrum. Patient negative for helicobacter pylori in the past. No       repeat biopsies taken.      The Z-line was regular and was found 35 cm from the incisors.       Esophagogastric landmarks were identified: the gastroesophageal junction       was found at 35 cm from the incisors.      Abnormal motility was noted in the esophagus. The cricopharyngeus was       normal. There is spasticity of the esophageal body. The distal       esophagus/lower esophageal sphincter is open. The scope was withdrawn.       Dilation was performed with a Maloney dilator with mild resistance at 52       Fr. The dilation site was examined following endoscope reinsertion and       showed mild mucosal disruption. Estimated blood loss was minimal. Impression:            - Normal duodenal bulb, first portion of the duodenum                         and second portion of the duodenum.                        - Gastritis.                        - Z-line regular, 35 cm from the incisors.                        - Esophagogastric landmarks identified.                        - Abnormal esophageal motility. Dilated.                        - No specimens collected.                        - Large amount of bile and fluid noted in the greater                         curvature that was suctioned. Potential gastroparesis                         picture Recommendation:        - Discharge patient  to home.                        - Resume previous diet.                        - No aspirin, ibuprofen, naproxen, or other                         non-steroidal anti-inflammatory drugs for 5 days after                         polyp removal.                        - Continue present medications.                        - Await pathology results.                        - Return to referring physician as previously                         scheduled.                        - Recommend gastric emptying study and manometry and                         motility testing. Attending Participation:      I personally performed the entire procedure. Volney American, DO Annamaria Helling DO, DO 09/24/2021  8:32:50 AM This report has been signed electronically. Number of Addenda: 0 Note Initiated On: 09/24/2021 7:30 AM Estimated Blood Loss:  Estimated blood loss was minimal.      [facility]

## 2021-09-24 NOTE — Interval H&P Note (Signed)
History and Physical Interval Note: Preprocedure H&P from 09/24/21  was reviewed and there was no interval change after seeing and examining the patient.  Written consent was obtained from the patient after discussion of risks, benefits, and alternatives. Patient has consented to proceed with Esophagogastroduodenoscopy and Colonoscopy with possible intervention   09/24/2021 7:37 AM  Sierra Copeland  has presented today for surgery, with the diagnosis of HX COLITIS DIARRHEA GERD.  The various methods of treatment have been discussed with the patient and family. After consideration of risks, benefits and other options for treatment, the patient has consented to  Procedure(s) with comments: COLONOSCOPY WITH PROPOFOL (N/A) - IDDM ESOPHAGOGASTRODUODENOSCOPY (EGD) (N/A) as a surgical intervention.  The patient's history has been reviewed, patient examined, no change in status, stable for surgery.  I have reviewed the patient's chart and labs.  Questions were answered to the patient's satisfaction.     Annamaria Helling

## 2021-09-24 NOTE — Anesthesia Preprocedure Evaluation (Signed)
Anesthesia Evaluation  Patient identified by MRN, date of birth, ID band Patient awake    Reviewed: Allergy & Precautions, NPO status , Patient's Chart, lab work & pertinent test results  History of Anesthesia Complications (+) PONV  Airway Mallampati: II  TM Distance: >3 FB Neck ROM: full    Dental  (+) Teeth Intact   Pulmonary neg pulmonary ROS, sleep apnea , former smoker,    Pulmonary exam normal  + decreased breath sounds      Cardiovascular hypertension, Pt. on medications + DOE  negative cardio ROS Normal cardiovascular exam Rhythm:Regular Rate:Normal     Neuro/Psych  Headaches, Anxiety Depression negative neurological ROS  negative psych ROS   GI/Hepatic negative GI ROS, Neg liver ROS, GERD  Medicated,  Endo/Other  negative endocrine ROSdiabetes, Well Controlled, Type 2, Oral Hypoglycemic Agents, Insulin Dependent  Renal/GU negative Renal ROS  negative genitourinary   Musculoskeletal  (+) Arthritis ,   Abdominal (+) + obese,   Peds negative pediatric ROS (+)  Hematology negative hematology ROS (+)   Anesthesia Other Findings Past Medical History: No date: Abnormal mammogram No date: Arthritis No date: Back pain     Comment:  spinal stenosis and neck bone spur No date: Cancer ([place])     Comment:  Breast No date: Car sickness No date: Collagenous colitis No date: Depression No date: Diabetes mellitus without complication (HCC) No date: GERD (gastroesophageal reflux disease)     Comment:  occ-no meds No date: Hair loss     Comment:  TAKING THYROTAIN No date: Headache     Comment:  migraines No date: Hyperlipemia No date: Hypertension No date: Osteoarthritis     Comment:  seen by Rheum, not enoug evidence to support RA No date: Pinched nerve No date: PONV (postoperative nausea and vomiting)     Comment:  after tonsillectomy No date: Rosacea No date: Seborrheic dermatitis No date: Sleep  apnea No date: Wears glasses  Past Surgical History: 2007: ACHILLES TENDON REPAIR     Comment:  right 2011: BREAST SURGERY     Comment:  lt br bx-non cancer No date: CARPAL TUNNEL RELEASE     Comment:  right and left 09/06/2017: CHOLECYSTECTOMY; N/A     Comment:  Procedure: LAPAROSCOPY;  Surgeon: Leonie Green,               MD;  Location: [facility];  Service: General;  Laterality:               N/A; No date: COLONOSCOPY 2017: epidural steroid injection x2 01/21/2016: ESOPHAGOGASTRODUODENOSCOPY; N/A     Comment:  Procedure: ESOPHAGOGASTRODUODENOSCOPY (EGD);  Surgeon:               Hulen Luster, MD;  Location: [place];  Service:               Gastroenterology;  Laterality: N/A;  Please call at work               [phone] Diabetic - insulin 2013: FINGER EXPLORATION     Comment:  left long  No date: MASTECTOMY; Left     Comment:  Partial 10/03/2013: NERVE EXPLORATION; Left     Comment:  Procedure: NERVE EXPLORATION/ LEFT LONG RADIAL DISTAL               NERVE neurolysis;  Surgeon: Schuyler Amor, MD;                Location: [facility];  Service:               Orthopedics;  Laterality: Left; 05/13/2016: SHOULDER ARTHROSCOPY WITH DEBRIDEMENT AND BICEP TENDON  REPAIR; Right     Comment:  Procedure: Arthroscopic debridement, open decompression,              rotator cuff repair, tenodesis,right shoulder.;  Surgeon:              Corky Mull, MD;  Location: [facility];  Service:               Orthopedics;  Laterality: Right; No date: TONSILLECTOMY 1998: UVULOPALATOPHARYNGOPLASTY     Comment:  and tonsils-tongue pexi  BMI    Body Mass Index: 41.57 kg/m      Reproductive/Obstetrics negative OB ROS                             Anesthesia Physical Anesthesia Plan  ASA: 3  Anesthesia Plan: General   Post-op Pain Management:    Induction: Intravenous  PONV Risk Score and Plan: 1 and Ondansetron  Airway Management  Planned: Nasal Cannula  Additional Equipment:   Intra-op Plan:   Post-operative Plan:   Informed Consent: I have reviewed the patients History and Physical, chart, labs and discussed the procedure including the risks, benefits and alternatives for the proposed anesthesia with the patient or authorized representative who has indicated his/her understanding and acceptance.     Dental Advisory Given  Plan Discussed with: CRNA and Surgeon  Anesthesia Plan Comments:         Anesthesia Quick Evaluation

## 2021-09-24 NOTE — Transfer of Care (Signed)
Immediate Anesthesia Transfer of Care Note  Patient: Sierra Copeland  Procedure(s) Performed: COLONOSCOPY WITH PROPOFOL ESOPHAGOGASTRODUODENOSCOPY (EGD)  Patient Location: PACU  Anesthesia Type:MAC  Level of Consciousness: awake  Airway & Oxygen Therapy: Patient Spontanous Breathing  Post-op Assessment: Report given to RN and Post -op Vital signs reviewed and stable  Post vital signs: stable  Last Vitals:  Vitals Value Taken Time  BP 114/53 09/24/21 0826  Temp 35.8 C 09/24/21 0825  Pulse 99 09/24/21 0827  Resp 10 09/24/21 0827  SpO2 95 % 09/24/21 0827  Vitals shown include unvalidated device data.  Last Pain:  Vitals:   09/24/21 0825  TempSrc: Tympanic  PainSc:          Complications: No notable events documented.

## 2021-09-28 LAB — SURGICAL PATHOLOGY

## 2021-09-29 DIAGNOSIS — M9901 Segmental and somatic dysfunction of cervical region: Secondary | ICD-10-CM | POA: Diagnosis not present

## 2021-09-29 DIAGNOSIS — M9903 Segmental and somatic dysfunction of lumbar region: Secondary | ICD-10-CM | POA: Diagnosis not present

## 2021-09-29 DIAGNOSIS — M5432 Sciatica, left side: Secondary | ICD-10-CM | POA: Diagnosis not present

## 2021-09-29 DIAGNOSIS — M5412 Radiculopathy, cervical region: Secondary | ICD-10-CM | POA: Diagnosis not present

## 2021-10-28 ENCOUNTER — Other Ambulatory Visit: Payer: Self-pay | Admitting: Family Medicine

## 2021-10-29 NOTE — Telephone Encounter (Signed)
Requested Prescriptions  Pending Prescriptions Disp Refills  . lisinopril (ZESTRIL) 20 MG tablet [Pharmacy Med Name: LISINOPRIL 20 MG TAB] 90 tablet 1    Sig: TAKE 1 TABLET BY MOUTH ONCE DAILY     Cardiovascular:  ACE Inhibitors Failed - 10/28/2021 11:00 AM      Failed - Cr in normal range and within 180 days    Creatinine, Ser  Date Value Ref Range Status  05/28/2021 1.08 (H) 0.57 - 1.00 mg/dL Final         Passed - K in normal range and within 180 days    Potassium  Date Value Ref Range Status  05/28/2021 4.9 3.5 - 5.2 mmol/L Final         Passed - Patient is not pregnant      Passed - Last BP in normal range    BP Readings from Last 1 Encounters:  09/24/21 134/69         Passed - Valid encounter within last 6 months    Recent Outpatient Visits          2 months ago Type 2 diabetes mellitus with hyperglycemia, with long-term current use of insulin ([place])   [place], Megan P, DO   5 months ago Type 2 diabetes mellitus with hyperglycemia, with long-term current use of insulin ([place])   [place], Megan P, DO   6 months ago Type 2 diabetes mellitus with hyperglycemia, with long-term current use of insulin ([place])   [place], Megan P, DO   8 months ago Type 2 diabetes mellitus with hyperglycemia, with long-term current use of insulin ([place])   [place], Megan P, DO   8 months ago Vaginal pain   [facility] Vigg, Avanti, MD      Future Appointments            In 4 weeks Johnson, Barb Merino, DO [place], [facility]           . simvastatin (ZOCOR) 40 MG tablet [Pharmacy Med Name: SIMVASTATIN 40 MG TAB] 90 tablet 1    Sig: TAKE 1 TABLET BY MOUTH AT BEDTIME     Cardiovascular:  Antilipid - Statins Failed - 10/28/2021 11:00 AM      Failed - Triglycerides in normal range and within 360 days    Triglycerides  Date Value Ref Range Status  05/28/2021 193 (H) 0 - 149  mg/dL Final   Triglycerides Piccolo,Waived  Date Value Ref Range Status  06/04/2016 147 <150 mg/dL Final    Comment:                            Normal                   <150                         Borderline High     150 - 199                         High                200 - 499                         Very High                >  499          Passed - Total Cholesterol in normal range and within 360 days    Cholesterol, Total  Date Value Ref Range Status  05/28/2021 147 100 - 199 mg/dL Final   Cholesterol Piccolo, Waived  Date Value Ref Range Status  06/04/2016 151 <200 mg/dL Final    Comment:                            Desirable                <200                         Borderline High      200- 239                         High                     >239          Passed - LDL in normal range and within 360 days    LDL Chol Calc (NIH)  Date Value Ref Range Status  05/28/2021 65 0 - 99 mg/dL Final         Passed - HDL in normal range and within 360 days    HDL  Date Value Ref Range Status  05/28/2021 50 >39 mg/dL Final         Passed - Patient is not pregnant      Passed - Valid encounter within last 12 months    Recent Outpatient Visits          2 months ago Type 2 diabetes mellitus with hyperglycemia, with long-term current use of insulin ([place])   [place], Megan P, DO   5 months ago Type 2 diabetes mellitus with hyperglycemia, with long-term current use of insulin ([place])   [place], Megan P, DO   6 months ago Type 2 diabetes mellitus with hyperglycemia, with long-term current use of insulin ([place])   [place], Megan P, DO   8 months ago Type 2 diabetes mellitus with hyperglycemia, with long-term current use of insulin ([place])   [place], Megan P, DO   8 months ago Vaginal pain   [facility] Vigg, Avanti, MD      Future Appointments            In 4 weeks  Johnson, Barb Merino, DO [employer], [facility]

## 2021-11-27 ENCOUNTER — Encounter: Payer: Medicare Other | Admitting: Family Medicine

## 2022-01-08 DIAGNOSIS — M5432 Sciatica, left side: Secondary | ICD-10-CM | POA: Diagnosis not present

## 2022-01-08 DIAGNOSIS — M9901 Segmental and somatic dysfunction of cervical region: Secondary | ICD-10-CM | POA: Diagnosis not present

## 2022-01-08 DIAGNOSIS — M5412 Radiculopathy, cervical region: Secondary | ICD-10-CM | POA: Diagnosis not present

## 2022-01-08 DIAGNOSIS — M9903 Segmental and somatic dysfunction of lumbar region: Secondary | ICD-10-CM | POA: Diagnosis not present

## 2022-01-19 ENCOUNTER — Encounter: Payer: Medicare Other | Admitting: Family Medicine

## 2022-01-22 ENCOUNTER — Encounter: Payer: Self-pay | Admitting: Family Medicine

## 2022-01-22 ENCOUNTER — Ambulatory Visit (INDEPENDENT_AMBULATORY_CARE_PROVIDER_SITE_OTHER): Payer: Medicare Other | Admitting: Family Medicine

## 2022-01-22 ENCOUNTER — Other Ambulatory Visit: Payer: Self-pay

## 2022-01-22 VITALS — BP 135/75 | HR 88 | Temp 98.3°F | Ht 60.5 in | Wt 219.0 lb

## 2022-01-22 DIAGNOSIS — Z0000 Encounter for general adult medical examination without abnormal findings: Secondary | ICD-10-CM | POA: Diagnosis not present

## 2022-01-22 DIAGNOSIS — M059 Rheumatoid arthritis with rheumatoid factor, unspecified: Secondary | ICD-10-CM | POA: Diagnosis not present

## 2022-01-22 DIAGNOSIS — E669 Obesity, unspecified: Secondary | ICD-10-CM

## 2022-01-22 DIAGNOSIS — E7800 Pure hypercholesterolemia, unspecified: Secondary | ICD-10-CM | POA: Diagnosis not present

## 2022-01-22 DIAGNOSIS — R1011 Right upper quadrant pain: Secondary | ICD-10-CM | POA: Diagnosis not present

## 2022-01-22 DIAGNOSIS — C50919 Malignant neoplasm of unspecified site of unspecified female breast: Secondary | ICD-10-CM | POA: Diagnosis not present

## 2022-01-22 DIAGNOSIS — Z794 Long term (current) use of insulin: Secondary | ICD-10-CM

## 2022-01-22 DIAGNOSIS — E1165 Type 2 diabetes mellitus with hyperglycemia: Secondary | ICD-10-CM

## 2022-01-22 DIAGNOSIS — C50912 Malignant neoplasm of unspecified site of left female breast: Secondary | ICD-10-CM

## 2022-01-22 DIAGNOSIS — I129 Hypertensive chronic kidney disease with stage 1 through stage 4 chronic kidney disease, or unspecified chronic kidney disease: Secondary | ICD-10-CM

## 2022-01-22 DIAGNOSIS — E1169 Type 2 diabetes mellitus with other specified complication: Secondary | ICD-10-CM

## 2022-01-22 DIAGNOSIS — F3342 Major depressive disorder, recurrent, in full remission: Secondary | ICD-10-CM

## 2022-01-22 LAB — BAYER DCA HB A1C WAIVED: HB A1C (BAYER DCA - WAIVED): 8.5 % — ABNORMAL HIGH (ref 4.8–5.6)

## 2022-01-22 MED ORDER — METFORMIN HCL 1000 MG PO TABS: 1000.0000 mg | ORAL_TABLET | Freq: Two times a day (BID) | ORAL | 1 refills | Status: DC

## 2022-01-22 MED ORDER — OMEPRAZOLE 40 MG PO CPDR: 40.0000 mg | DELAYED_RELEASE_CAPSULE | Freq: Every day | ORAL | 1 refills | Status: DC

## 2022-01-22 MED ORDER — GABAPENTIN 100 MG PO CAPS: 100.0000 mg | ORAL_CAPSULE | Freq: Two times a day (BID) | ORAL | 1 refills | Status: DC

## 2022-01-22 MED ORDER — SIMVASTATIN 40 MG PO TABS: 40.0000 mg | ORAL_TABLET | Freq: Every day | ORAL | 1 refills | Status: DC

## 2022-01-22 MED ORDER — TRULICITY 1.5 MG/0.5ML ~~LOC~~ SOAJ: 1.5000 mg | SUBCUTANEOUS | 1 refills | Status: DC

## 2022-01-22 MED ORDER — ESCITALOPRAM OXALATE 20 MG PO TABS: 20.0000 mg | ORAL_TABLET | Freq: Every day | ORAL | 1 refills | Status: DC

## 2022-01-22 MED ORDER — LISINOPRIL 20 MG PO TABS: 20.0000 mg | ORAL_TABLET | Freq: Every day | ORAL | 1 refills | Status: DC

## 2022-01-22 MED ORDER — AMLODIPINE BESYLATE 2.5 MG PO TABS
2.5000 mg | ORAL_TABLET | Freq: Every day | ORAL | 1 refills | Status: DC
Start: 2022-01-22 — End: 2022-07-26

## 2022-01-22 MED ORDER — TOUJEO SOLOSTAR 300 UNIT/ML ~~LOC~~ SOPN: PEN_INJECTOR | SUBCUTANEOUS | 12 refills | Status: DC

## 2022-01-22 MED ORDER — FREESTYLE LIBRE 14 DAY SENSOR MISC: 1.0000 | 3 refills | Status: DC

## 2022-01-22 NOTE — Assessment & Plan Note (Signed)
Up slightly with a1c of 8.5. Will really work on her diet and recheck 3 month. Call with any concerns. Recheck 3 months.

## 2022-01-22 NOTE — Assessment & Plan Note (Signed)
Under good control on current regimen. Continue current regimen. Continue to monitor. Call with any concerns. Refills given. Labs drawn today.   

## 2022-01-22 NOTE — Assessment & Plan Note (Signed)
Wants to change her care to [facility]. Referral to cancer center made today.

## 2022-01-22 NOTE — Progress Notes (Signed)
BP 135/75    Pulse 88    Temp 98.3 F (36.8 C)    Ht 5' 0.5" (1.537 m)    Wt 219 lb (99.3 kg)    LMP 03/17/2013 (Approximate)    SpO2 98%    BMI 42.07 kg/m    Subjective:    Patient ID: Sierra Copeland, female    DOB: 05-07-1956, 66 y.o.   MRN: [id number]  HPI: Sierra Copeland is a 66 y.o. female presenting on 01/22/2022 for comprehensive medical examination. Current medical complaints include:  HYPERTENSION / HYPERLIPIDEMIA Satisfied with current treatment? yes Duration of hypertension: chronic BP monitoring frequency: not checking BP medication side effects: no Past BP meds: amlodipine, lisinopril Duration of hyperlipidemia: chronic Cholesterol medication side effects: no Cholesterol supplements: none Past cholesterol medications: simvastatin Medication compliance: excellent compliance Aspirin: yes Recent stressors: no Recurrent headaches: no Visual changes: no Palpitations: no Dyspnea: no Chest pain: no Lower extremity edema: no Dizzy/lightheaded: no  DIABETES Hypoglycemic episodes:no Polydipsia/polyuria: yes Visual disturbance: no Chest pain: no Paresthesias: yes Glucose Monitoring: yes  Accucheck frequency: Not Checking Taking Insulin?: yes Blood Pressure Monitoring: not checking Retinal Examination: Up to Date Foot Exam: Up to Date Diabetic Education: Completed Pneumovax: Up to Date Influenza: Up to Date Aspirin: yes  DEPRESSION- has been under a lot of stress. Husband and has been really sick Mood status: exacerbated Satisfied with current treatment?: yes Symptom severity: moderate  Duration of current treatment : chronic Side effects: no Medication compliance: excellent compliance Psychotherapy/counseling: no  Previous psychiatric medications: lexapro Depressed mood: yes Anxious mood: yes Anhedonia: no Significant weight loss or gain: no Insomnia: yes  Fatigue: yes Feelings of worthlessness or guilt: yes Impaired concentration/indecisiveness:  no Suicidal ideations: no Hopelessness: no Crying spells: no Depression screen [facility] 2/9 01/22/2022 08/28/2021 04/20/2021 02/24/2021 02/04/2021  Decreased Interest 0 0 0 0 0  Down, Depressed, Hopeless 0 0 0 0 0  PHQ - 2 Score 0 0 0 0 0  Altered sleeping 0 - 0 0 -  Tired, decreased energy 1 - 0 0 -  Change in appetite 0 - 0 0 -  Feeling bad or failure about yourself  0 - 0 0 -  Trouble concentrating 0 - 0 0 -  Moving slowly or fidgety/restless 0 - 0 0 -  Suicidal thoughts 0 - 0 0 -  PHQ-9 Score 1 - 0 0 -  Difficult doing work/chores - - - - -  Some recent data might be hidden   She currently lives with: husband Menopausal Symptoms: no  Depression Screen done today and results listed below:  Depression screen [facility] 2/9 01/22/2022 08/28/2021 04/20/2021 02/24/2021 02/04/2021  Decreased Interest 0 0 0 0 0  Down, Depressed, Hopeless 0 0 0 0 0  PHQ - 2 Score 0 0 0 0 0  Altered sleeping 0 - 0 0 -  Tired, decreased energy 1 - 0 0 -  Change in appetite 0 - 0 0 -  Feeling bad or failure about yourself  0 - 0 0 -  Trouble concentrating 0 - 0 0 -  Moving slowly or fidgety/restless 0 - 0 0 -  Suicidal thoughts 0 - 0 0 -  PHQ-9 Score 1 - 0 0 -  Difficult doing work/chores - - - - -  Some recent data might be hidden     Past Medical History:  Past Medical History:  Diagnosis Date   Abnormal mammogram    Arthritis    Back pain  spinal stenosis and neck bone spur   Cancer (HCC)    Breast   Car sickness    Collagenous colitis    Depression    Diabetes mellitus without complication ([place])    GERD (gastroesophageal reflux disease)    occ-no meds   Hair loss    TAKING THYROTAIN   Headache    migraines   Hyperlipemia    Hypertension    Osteoarthritis    seen by Rheum, not enoug evidence to support RA   Pinched nerve    PONV (postoperative nausea and vomiting)    after tonsillectomy   Rosacea    Seborrheic dermatitis    Sleep apnea    Wears glasses     Surgical History:  Past  Surgical History:  Procedure Laterality Date   ACHILLES TENDON REPAIR  2007   right   BREAST SURGERY  2011   lt br bx-non cancer   CARPAL TUNNEL RELEASE     right and left   CHOLECYSTECTOMY N/A 09/06/2017   Gallbladder not removed due to complications. Procedure: LAPAROSCOPY;  Surgeon: Leonie Green, MD;  Location: [facility];  Service: General;  Laterality: N/A;   COLONOSCOPY     COLONOSCOPY WITH PROPOFOL N/A 09/24/2021   Procedure: COLONOSCOPY WITH PROPOFOL;  Surgeon: Annamaria Helling, DO;  [facility] ENDOSCOPY;  Service: Gastroenterology;  Laterality: N/A;  IDDM   epidural steroid injection x2  2017   ESOPHAGOGASTRODUODENOSCOPY N/A 01/21/2016   Procedure: ESOPHAGOGASTRODUODENOSCOPY (EGD);  Surgeon: Hulen Luster, MD;  Location: [place];  Service: Gastroenterology;  Laterality: N/A;  Please call at work [phone] Diabetic - insulin   ESOPHAGOGASTRODUODENOSCOPY N/A 09/24/2021   Procedure: ESOPHAGOGASTRODUODENOSCOPY (EGD);  Surgeon: Annamaria Helling, DO;  Location[facility] END[facility]COPY;  Service: Gastroenterology;  Laterality: N/A;   FINGER EXPLORATION  2013   left long    MASTECTOMY Left    Partial   NERVE EXPLORATION Left 10/03/2013   Procedure: NERVE EXPLORATION/ LEFT LONG RADIAL DISTAL NERVE neurolysis;  Surgeon: Schuyler Amor, MD;  Location: [place];  Service: Orthopedics;  Laterality: Left;   SHOULDER ARTHROSCOPY WITH DEBRIDEMENT AND BICEP TENDON REPAIR Right 05/13/2016   Procedure: Arthroscopic debridement, open decompression, rotator cuff repair, tenodesis,right shoulder.;  Surgeon: Corky Mull, MD;  Location: [facility] ORS;  Service: Orthopedics;  Laterality: Right;   TONSILLECTOMY     UVULOPALATOPHARYNGOPLASTY  1998   and tonsils-tongue pexi    Medications:  Current Outpatient Medications on File Prior to Visit  Medication Sig   Cholecalciferol (VITAMIN D3) 2000 units capsule Take 2,000 Units by mouth at bedtime.    Coenzyme Q10  (COQ-10) 30 MG CAPS Take by mouth.   fexofenadine (ALLEGRA) 180 MG tablet Take 180 mg by mouth daily with breakfast.    fluticasone (FLONASE) 50 MCG/ACT nasal spray Place 1-2 sprays into both nostrils every evening.    HUMALOG KWIKPEN 100 UNIT/ML KwikPen INJECT 10-20 UNITS INTO THE SKIN WITH BREAKFAST, WITH LUNCH AND WITH EVENING MEAL   Hyoscyamine Sulfate SL 0.125 MG SUBL Place under the tongue.   ibuprofen (ADVIL,MOTRIN) 200 MG tablet Take 800 mg by mouth every 8 (eight) hours as needed (for pain.).    Insulin Syringe-Needle U-100 30G X 5/16" 0.3 ML MISC    Magnesium 250 MG TABS Take by mouth.   tiZANidine (ZANAFLEX) 2 MG tablet Take 2 mg by mouth at bedtime.    traMADol (ULTRAM) 50 MG tablet Take 100 mg by mouth at bedtime.  ULTRACARE PEN NEEDLES 33G X 4 MM MISC    zinc gluconate 50 MG tablet Take 50 mg by mouth daily.   No current facility-administered medications on file prior to visit.    Allergies:  Allergies  Allergen Reactions   Prednisone Palpitations    Increases BG and HR.    Social History:  Social History   Socioeconomic History   Marital status: Married    Spouse name: Not on file   Number of children: Not on file   Years of education: Not on file   Highest education level: Not on file  Occupational History   Not on file  Tobacco Use   Smoking status: Former    Packs/day: 1.00    Years: 10.00    Pack years: 10.00    Types: Cigarettes    Quit date: 09/28/1986    Years since quitting: 35.3   Smokeless tobacco: Never  Vaping Use   Vaping Use: Never used  Substance and Sexual Activity   Alcohol use: No   Drug use: No   Sexual activity: Yes  Other Topics Concern   Not on file  Social History Narrative   Not on file   Social Determinants of Health   Financial Resource Strain: Low Risk    Difficulty of Paying Living Expenses: Not hard at all  Food Insecurity: No Food Insecurity   Worried About Charity fundraiser in the Last Year: Never true   [place] in the Last Year: Never true  Transportation Needs: No Transportation Needs   Lack of Transportation (Medical): No   Lack of Transportation (Non-Medical): No  Physical Activity: Inactive   Days of Exercise per Week: 0 days   Minutes of Exercise per Session: 0 min  Stress: No Stress Concern Present   Feeling of Stress : Not at all  Social Connections: Moderately Integrated   Frequency of Communication with Friends and Family: Twice a week   Frequency of Social Gatherings with Friends and Family: Once a week   Attends Religious Services: More than 4 times per year   Active Member of [employer] or Organizations: No   Attends Music therapist: Never   Marital Status: Married  Human resources officer Violence: Not At Risk   Fear of Current or Ex-Partner: No   Emotionally Abused: No   Physically Abused: No   Sexually Abused: No   Social History   Tobacco Use  Smoking Status Former   Packs/day: 1.00   Years: 10.00   Pack years: 10.00   Types: Cigarettes   Quit date: 09/28/1986   Years since quitting: 35.3  Smokeless Tobacco Never   Social History   Substance and Sexual Activity  Alcohol Use No    Family History:  Family History  Problem Relation Age of Onset   Cancer Mother        liver   Hypertension Mother    Hyperlipidemia Mother    Hyperlipidemia Father    Hypertension Father    Stroke Maternal Grandfather    Diabetes Paternal Grandmother    Dementia Maternal Grandmother    Heart disease Paternal Grandfather    Diabetes Maternal Aunt    Diabetes Maternal Uncle    Heart disease Maternal Uncle    Cancer Paternal Aunt        breast   Diabetes Paternal Aunt    Diabetes Paternal Uncle    Cancer Paternal Uncle        prostate  Cancer Paternal Aunt        breast    Past medical history, surgical history, medications, allergies, family history and social history reviewed with patient today and changes made to appropriate areas of the chart.    Review of Systems  Constitutional:  Positive for diaphoresis. Negative for chills, fever, malaise/fatigue and weight loss.  HENT:  Positive for ear pain. Negative for congestion, ear discharge, hearing loss, nosebleeds, sinus pain, sore throat and tinnitus.   Eyes: Negative.   Respiratory: Negative.  Negative for stridor.   Cardiovascular:  Positive for palpitations. Negative for chest pain, orthopnea, claudication, leg swelling and PND.  Gastrointestinal:  Positive for diarrhea and heartburn. Negative for abdominal pain, blood in stool, constipation, melena, nausea and vomiting.  Genitourinary: Negative.   Musculoskeletal:  Positive for joint pain and myalgias. Negative for back pain, falls and neck pain.  Skin:  Positive for rash. Negative for itching.  Neurological:  Positive for dizziness. Negative for tingling, tremors, sensory change, speech change, focal weakness, seizures, loss of consciousness, weakness and headaches.  Endo/Heme/Allergies:  Positive for environmental allergies. Negative for polydipsia. Does not bruise/bleed easily.  Psychiatric/Behavioral:  Negative for depression, hallucinations, memory loss, substance abuse and suicidal ideas. The patient is nervous/anxious. The patient does not have insomnia.   All other ROS negative except what is listed above and in the HPI.      Objective:    BP 135/75    Pulse 88    Temp 98.3 F (36.8 C)    Ht 5' 0.5" (1.537 m)    Wt 219 lb (99.3 kg)    LMP 03/17/2013 (Approximate)    SpO2 98%    BMI 42.07 kg/m   Wt Readings from Last 3 Encounters:  01/22/22 219 lb (99.3 kg)  09/24/21 220 lb (99.8 kg)  05/28/21 224 lb (101.6 kg)    Physical Exam Vitals and nursing note reviewed.  Constitutional:      General: She is not in acute distress.    Appearance: Normal appearance. She is not ill-appearing, toxic-appearing or diaphoretic.  HENT:     Head: Normocephalic and atraumatic.     Right Ear: Tympanic membrane, ear canal and external  ear normal. There is no impacted cerumen.     Left Ear: Tympanic membrane, ear canal and external ear normal. There is no impacted cerumen.     Nose: Nose normal. No congestion or rhinorrhea.     Mouth/Throat:     Mouth: Mucous membranes are moist.     Pharynx: Oropharynx is clear. No oropharyngeal exudate or posterior oropharyngeal erythema.  Eyes:     General: No scleral icterus.       Right eye: No discharge.        Left eye: No discharge.     Extraocular Movements: Extraocular movements intact.     Conjunctiva/sclera: Conjunctivae normal.     Pupils: Pupils are equal, round, and reactive to light.  Neck:     Vascular: No carotid bruit.  Cardiovascular:     Rate and Rhythm: Normal rate and regular rhythm.     Pulses: Normal pulses.     Heart sounds: No murmur heard.   No friction rub. No gallop.  Pulmonary:     Effort: Pulmonary effort is normal. No respiratory distress.     Breath sounds: Normal breath sounds. No stridor. No wheezing, rhonchi or rales.  Chest:     Chest wall: No tenderness.  Abdominal:     General: Abdomen is  flat. Bowel sounds are normal. There is no distension.     Palpations: Abdomen is soft. There is no mass.     Tenderness: There is abdominal tenderness (RUQ). There is no right CVA tenderness, left CVA tenderness, guarding or rebound.     Hernia: No hernia is present.  Genitourinary:    Comments: Breast and pelvic exams deferred with shared decision making Musculoskeletal:        General: No swelling, tenderness, deformity or signs of injury.     Cervical back: Normal range of motion and neck supple. No rigidity. No muscular tenderness.     Right lower leg: No edema.     Left lower leg: No edema.  Lymphadenopathy:     Cervical: No cervical adenopathy.  Skin:    General: Skin is warm and dry.     Capillary Refill: Capillary refill takes less than 2 seconds.     Coloration: Skin is not jaundiced or pale.     Findings: No bruising, erythema, lesion or  rash.  Neurological:     General: No focal deficit present.     Mental Status: She is alert and oriented to person, place, and time. Mental status is at baseline.     Cranial Nerves: No cranial nerve deficit.     Sensory: No sensory deficit.     Motor: No weakness.     Coordination: Coordination normal.     Gait: Gait normal.     Deep Tendon Reflexes: Reflexes normal.  Psychiatric:        Mood and Affect: Mood normal.        Behavior: Behavior normal.        Thought Content: Thought content normal.        Judgment: Judgment normal.    Results for orders placed or performed in visit on 01/22/22  Bayer DCA Hb A1c Waived  Result Value Ref Range   HB A1C (BAYER DCA - WAIVED) 8.5 (H) 4.8 - 5.6 %      Assessment & Plan:   Problem List Items Addressed This Visit       Endocrine   Diabetes mellitus type 2 in obese ([place])    Up slightly with a1c of 8.5. Will really work on her diet and recheck 3 month. Call with any concerns. Recheck 3 months.      Relevant Medications   metFORMIN (GLUCOPHAGE) 1000 MG tablet   simvastatin (ZOCOR) 40 MG tablet   lisinopril (ZESTRIL) 20 MG tablet   insulin glargine, 1 Unit Dial, (TOUJEO SOLOSTAR) 300 UNIT/ML Solostar Pen   Dulaglutide (TRULICITY) 1.5 GY/1.7CB SOPN   Type 2 diabetes mellitus with hyperglycemia (HCC)    Up slightly with a1c of 8.5. Will really work on her diet and recheck 3 month. Call with any concerns. Recheck 3 months.      Relevant Medications   metFORMIN (GLUCOPHAGE) 1000 MG tablet   simvastatin (ZOCOR) 40 MG tablet   lisinopril (ZESTRIL) 20 MG tablet   insulin glargine, 1 Unit Dial, (TOUJEO SOLOSTAR) 300 UNIT/ML Solostar Pen   Dulaglutide (TRULICITY) 1.5 SW/9.6PR SOPN   Other Relevant Orders   CBC with Differential/Platelet   Comprehensive metabolic panel   Bayer DCA Hb A1c Waived (Completed)   Urinalysis, Routine w reflex microscopic     Musculoskeletal and Integument   Rheumatoid arthritis (HCC)    Continue to  follow with rheumatology. Call with any concerns. Continue to monitor.         Genitourinary   Benign  hypertensive renal disease    Under good control on current regimen. Continue current regimen. Continue to monitor. Call with any concerns. Refills given. Labs drawn today.       Relevant Orders   CBC with Differential/Platelet   Comprehensive metabolic panel   TSH   Microalbumin, Urine Waived     Other   Hypercholesteremia    Under good control on current regimen. Continue current regimen. Continue to monitor. Call with any concerns. Refills given. Labs drawn today.       Relevant Medications   simvastatin (ZOCOR) 40 MG tablet   lisinopril (ZESTRIL) 20 MG tablet   amLODipine (NORVASC) 2.5 MG tablet   Other Relevant Orders   CBC with Differential/Platelet   Comprehensive metabolic panel   Lipid Panel w/o Chol/HDL Ratio   Invasive ductal carcinoma of breast, female, left (HCC)    Wants to change her care to [facility]. Referral to cancer center made today.      Relevant Orders   Ambulatory referral to Hematology / Oncology   Recurrent major depressive disorder, in full remission ([place])    Under good control on current regimen. Continue current regimen. Continue to monitor. Call with any concerns. Refills given. Labs drawn today.       Relevant Medications   escitalopram (LEXAPRO) 20 MG tablet   Other Relevant Orders   CBC with Differential/Platelet   Comprehensive metabolic panel   Breast cancer ([place])    Wants to change her care to [facility]. Referral to cancer center made today.      Relevant Orders   Ambulatory referral to Hematology / Oncology   Other Visit Diagnoses     Routine general medical examination at a health care facility    -  Primary   Vaccines up to date/declined. Screening labs checked today. Mammogram up to date. Colonoscopy up to date. Continue diet and exercise. Call with any concerns.    RUQ pain       Will check [place]. Await results. Treat as needed.     Relevant Orders   US Abdomen Limited RUQ (LIVER/GB)        Follow up plan: Return in about 3 months (around 04/21/2022).   LABORATORY TESTING:  - Pap smear: up to date  IMMUNIZATIONS:   - Tdap: Tetanus vaccination status reviewed: last tetanus booster within 10 years. - Influenza: Refused - Pneumovax: Up to date - Prevnar: Not applicable - COVID: Refused  SCREENING: -Mammogram: Up to date  - Colonoscopy: Up to date   PATIENT COUNSELING:   Advised to take 1 mg of folate supplement per day if capable of pregnancy.   Sexuality: Discussed sexually transmitted diseases, partner selection, use of condoms, avoidance of unintended pregnancy  and contraceptive alternatives.   Advised to avoid cigarette smoking.  I discussed with the patient that most people either abstain from alcohol or drink within safe limits (<=14/week and <=4 drinks/occasion for males, <=7/weeks and <= 3 drinks/occasion for females) and that the risk for alcohol disorders and other health effects rises proportionally with the number of drinks per week and how often a drinker exceeds daily limits.  Discussed cessation/primary prevention of drug use and availability of treatment for abuse.   Diet: Encouraged to adjust caloric intake to maintain  or achieve ideal body weight, to reduce intake of dietary saturated fat and total fat, to limit sodium intake by avoiding high sodium foods and not adding table salt, and to maintain adequate dietary potassium and calcium preferably from fresh  fruits, vegetables, and low-fat dairy products.    stressed the importance of regular exercise  Injury prevention: Discussed safety belts, safety helmets, smoke detector, smoking near bedding or upholstery.   Dental health: Discussed importance of regular tooth brushing, flossing, and dental visits.    NEXT PREVENTATIVE PHYSICAL DUE IN 1 YEAR. Return in about 3 months (around 04/21/2022).

## 2022-01-22 NOTE — Assessment & Plan Note (Signed)
Continue to follow with rheumatology. Call with any concerns. Continue to monitor.  

## 2022-01-22 NOTE — Assessment & Plan Note (Signed)
Wants to change her care to [facility]. Referral to cancer center made today.

## 2022-01-23 LAB — CBC WITH DIFFERENTIAL/PLATELET
Basophils Absolute: 0.1 10*3/uL (ref 0.0–0.2)
Basos: 1 %
EOS (ABSOLUTE): 0.2 10*3/uL (ref 0.0–0.4)
Eos: 4 %
Hematocrit: 42.9 % (ref 34.0–46.6)
Hemoglobin: 13.7 g/dL (ref 11.1–15.9)
Immature Grans (Abs): 0 10*3/uL (ref 0.0–0.1)
Immature Granulocytes: 0 %
Lymphocytes Absolute: 1.9 10*3/uL (ref 0.7–3.1)
Lymphs: 33 %
MCH: 27.3 pg (ref 26.6–33.0)
MCHC: 31.9 g/dL (ref 31.5–35.7)
MCV: 86 fL (ref 79–97)
Monocytes Absolute: 0.5 10*3/uL (ref 0.1–0.9)
Monocytes: 8 %
Neutrophils Absolute: 3 10*3/uL (ref 1.4–7.0)
Neutrophils: 54 %
Platelets: 244 10*3/uL (ref 150–450)
RBC: 5.01 x10E6/uL (ref 3.77–5.28)
RDW: 13.4 % (ref 11.7–15.4)
WBC: 5.7 10*3/uL (ref 3.4–10.8)

## 2022-01-23 LAB — COMPREHENSIVE METABOLIC PANEL
ALT: 19 IU/L (ref 0–32)
AST: 17 IU/L (ref 0–40)
Albumin/Globulin Ratio: 1.7 (ref 1.2–2.2)
Albumin: 4 g/dL (ref 3.8–4.8)
Alkaline Phosphatase: 37 IU/L — ABNORMAL LOW (ref 44–121)
BUN/Creatinine Ratio: 14 (ref 12–28)
BUN: 14 mg/dL (ref 8–27)
Bilirubin Total: 0.2 mg/dL (ref 0.0–1.2)
CO2: 24 mmol/L (ref 20–29)
Calcium: 8.9 mg/dL (ref 8.7–10.3)
Chloride: 99 mmol/L (ref 96–106)
Creatinine, Ser: 0.99 mg/dL (ref 0.57–1.00)
Globulin, Total: 2.4 g/dL (ref 1.5–4.5)
Glucose: 218 mg/dL — ABNORMAL HIGH (ref 70–99)
Potassium: 4.3 mmol/L (ref 3.5–5.2)
Sodium: 141 mmol/L (ref 134–144)
Total Protein: 6.4 g/dL (ref 6.0–8.5)
eGFR: 64 mL/min/{1.73_m2} (ref >59)

## 2022-01-23 LAB — LIPID PANEL W/O CHOL/HDL RATIO
Cholesterol, Total: 133 mg/dL (ref 100–199)
HDL: 47 mg/dL (ref >39)
LDL Chol Calc (NIH): 59 mg/dL (ref 0–99)
Triglycerides: 158 mg/dL — ABNORMAL HIGH (ref 0–149)
VLDL Cholesterol Cal: 27 mg/dL (ref 5–40)

## 2022-01-23 LAB — TSH: TSH: 1.97 u[IU]/mL (ref 0.450–4.500)

## 2022-01-25 ENCOUNTER — Encounter: Payer: Self-pay | Admitting: *Deleted

## 2022-01-25 DIAGNOSIS — M9901 Segmental and somatic dysfunction of cervical region: Secondary | ICD-10-CM | POA: Diagnosis not present

## 2022-01-25 DIAGNOSIS — M5412 Radiculopathy, cervical region: Secondary | ICD-10-CM | POA: Diagnosis not present

## 2022-01-25 DIAGNOSIS — M9903 Segmental and somatic dysfunction of lumbar region: Secondary | ICD-10-CM | POA: Diagnosis not present

## 2022-01-25 DIAGNOSIS — M5432 Sciatica, left side: Secondary | ICD-10-CM | POA: Diagnosis not present

## 2022-01-28 DIAGNOSIS — M5136 Other intervertebral disc degeneration, lumbar region: Secondary | ICD-10-CM | POA: Diagnosis not present

## 2022-01-28 DIAGNOSIS — M059 Rheumatoid arthritis with rheumatoid factor, unspecified: Secondary | ICD-10-CM | POA: Diagnosis not present

## 2022-01-28 DIAGNOSIS — Z7960 Long term (current) use of unspecified immunomodulators and immunosuppressants: Secondary | ICD-10-CM | POA: Diagnosis not present

## 2022-01-28 NOTE — Progress Notes (Signed)
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  She now wishes to transfer her care.  She currently feels well and is asymptomatic.  She has no neurologic complaints.  She denies any recent fevers or illnesses.  She has a good appetite and denies weight loss.  She has no chest pain, shortness of breath, cough, or hemoptysis.  She denies any nausea, vomiting, constipation, diarrhea.  She has no urinary complaints.  Patient feels at her baseline and offers no specific complaints today.  REVIEW OF SYSTEMS:   Review of Systems  Constitutional: Negative.  Negative for fever, malaise/fatigue and weight loss.  Respiratory: Negative.  Negative for cough, hemoptysis and shortness of breath.   Cardiovascular: Negative.  Negative for chest pain and leg swelling.  Gastrointestinal: Negative.  Negative for abdominal pain.  Genitourinary: Negative.  Negative for dysuria.  Musculoskeletal: Negative.  Negative for back pain.  Skin: Negative.  Negative for rash.  Neurological: Negative.  Negative for dizziness, focal weakness, weakness and headaches.  Psychiatric/Behavioral: Negative.  The patient is not nervous/anxious.    As per HPI. Otherwise, a complete review of systems is negative.  PAST MEDICAL HISTORY: Past Medical History:  Diagnosis Date   Abnormal mammogram    Anxiety    Arthritis    Back pain    spinal stenosis and neck bone spur   Cancer (HCC)    Breast   Car sickness     Collagenous colitis    Depression    Diabetes mellitus without complication (HCC)    GERD (gastroesophageal reflux disease)    occ-no meds   Hair loss    TAKING THYROTAIN   Headache    migraines   Hyperlipemia    Hypertension    Osteoarthritis    seen by Rheum, not enoug evidence to support RA   Pinched nerve    PONV (postoperative nausea and vomiting)    after tonsillectomy   Rosacea    Seborrheic dermatitis    Sleep apnea    Triple negative breast cancer ([place])    -history of a stage I left breast cancer. Tumor is triple negative. She is dated radiation in August 2018. Continues to have problems with lymphedema   Wears glasses     PAST SURGICAL HISTORY: Past Surgical History:  Procedure Laterality Date   ACHILLES TENDON REPAIR  2007   right   BREAST LUMPECTOMY  2018   BREAST LUMPECTOMY Left 03/2017   BREAST SURGERY  2011   lt br bx-non cancer   CARPAL TUNNEL RELEASE     right and left   CHOLECYSTECTOMY N/A 09/06/2017   Gallbladder not removed due to complications. Procedure: LAPAROSCOPY;  Surgeon: Leonie Green, MD;  Location: [facility];  Service: General;  Laterality: N/A;   COLONOSCOPY     COLONOSCOPY WITH PROPOFOL N/A 09/24/2021   Procedure: COLONOSCOPY WITH PROPOFOL;  Surgeon: Annamaria Helling, DO;  Location: [facility] ENDOSCOPY;  Service: Gastroenterology;  Laterality: N/A;  IDDM   epidural steroid injection x2  2017  ESOPHAGOGASTRODUODENOSCOPY N/A 01/21/2016   Procedure: ESOPHAGOGASTRODUODENOSCOPY (EGD);  Surgeon: Hulen Luster, MD;  Location: [place];  Service: Gastroenterology;  Laterality: N/A;  Please call at work [phone] Diabetic - insulin   ESOPHAGOGASTRODUODENOSCOPY N/A 09/24/2021   Procedure: ESOPHAGOGASTRODUODENOSCOPY (EGD);  Surgeon: Annamaria Helling, DO;  Location: [facility] ENDOSCOPY;  Service: Gastroenterology;  Laterality: N/A;   FINGER EXPLORATION  2013   left long    MASTECTOMY Left    Partial   NERVE EXPLORATION Left  10/03/2013   Procedure: NERVE EXPLORATION/ LEFT LONG RADIAL DISTAL NERVE neurolysis;  Surgeon: Schuyler Amor, MD;  Location: [place];  Service: Orthopedics;  Laterality: Left;   SHOULDER ARTHROSCOPY WITH DEBRIDEMENT AND BICEP TENDON REPAIR Right 05/13/2016   Procedure: Arthroscopic debridement, open decompression, rotator cuff repair, tenodesis,right shoulder.;  Surgeon: Corky Mull, MD;  Location: [facility];  Service: Orthopedics;  Laterality: Right;   TONSILLECTOMY     UVULOPALATOPHARYNGOPLASTY  1998   and tonsils-tongue pexi    BREAST BIOPSY Left 03/14/2017       FAMILY HISTORY: Family History  Problem Relation Age of Onset   Liver cancer Mother    Hypertension Mother    Hyperlipidemia Mother    Hyperlipidemia Father    Hypertension Father    Dementia Maternal Grandmother    Stroke Maternal Grandfather    Diabetes Paternal Grandmother    Heart disease Paternal Grandfather    Diabetes Maternal Aunt    Diabetes Maternal Uncle    Heart disease Maternal Uncle    Breast cancer Paternal Aunt    Diabetes Paternal Aunt    Breast cancer Paternal Aunt    Diabetes Paternal Uncle    Prostate cancer Paternal Uncle        prostate   Other Cousin 40       BRCA or similar mutation positive    ADVANCED DIRECTIVES (Y/N):  N  HEALTH MAINTENANCE: Social History   Tobacco Use   Smoking status: Former    Packs/day: 1.00    Years: 10.00    Pack years: 10.00    Types: Cigarettes    Quit date: 09/28/1986    Years since quitting: 35.3   Smokeless tobacco: Never  Vaping Use   Vaping Use: Never used  Substance Use Topics   Alcohol use: No   Drug use: No     Colonoscopy:  PAP:  Bone density:  Lipid panel:  Allergies  Allergen Reactions   Prednisone Palpitations    Increases BG and HR.    Current Outpatient Medications  Medication Sig Dispense Refill   amLODipine (NORVASC) 2.5 MG tablet Take 1 tablet (2.5 mg total) by mouth daily. 90 tablet 1    Cholecalciferol (VITAMIN D3) 2000 units capsule Take 2,000 Units by mouth at bedtime.      Coenzyme Q10 (COQ-10) 30 MG CAPS Take by mouth.     Continuous Blood Gluc Sensor (FREESTYLE LIBRE 14 DAY SENSOR) MISC 1 each by Does not apply route every 14 (fourteen) days. 12 each 3   Dulaglutide (TRULICITY) 1.5 YV/8.5FY SOPN Inject 1.5 mg into the skin once a week. 12 mL 1   escitalopram (LEXAPRO) 20 MG tablet Take 1 tablet (20 mg total) by mouth daily. 90 tablet 1   fexofenadine (ALLEGRA) 180 MG tablet Take 180 mg by mouth daily with breakfast.      fluticasone (FLONASE) 50 MCG/ACT nasal spray Place 1-2 sprays into both nostrils every evening.      gabapentin (NEURONTIN) 100  MG capsule Take 1 capsule (100 mg total) by mouth 2 (two) times daily. 180 capsule 1   HUMALOG KWIKPEN 100 UNIT/ML KwikPen INJECT 10-20 UNITS INTO THE SKIN WITH BREAKFAST, WITH LUNCH AND WITH EVENING MEAL 54 mL 1   Hyoscyamine Sulfate SL 0.125 MG SUBL Place under the tongue.     ibuprofen (ADVIL,MOTRIN) 200 MG tablet Take 800 mg by mouth every 8 (eight) hours as needed (for pain.).      insulin glargine, 1 Unit Dial, (TOUJEO SOLOSTAR) 300 UNIT/ML Solostar Pen 80 units at bedtime and 20 units at lunch time 10 mL 12   Insulin Syringe-Needle U-100 30G X 5/16" 0.3 ML MISC      lisinopril (ZESTRIL) 20 MG tablet Take 1 tablet (20 mg total) by mouth daily. 90 tablet 1   Magnesium 250 MG TABS Take by mouth.     metFORMIN (GLUCOPHAGE) 1000 MG tablet Take 1 tablet (1,000 mg total) by mouth 2 (two) times daily with a meal. 180 tablet 1   omeprazole (PRILOSEC) 40 MG capsule Take 1 capsule (40 mg total) by mouth daily. 90 capsule 1   simvastatin (ZOCOR) 40 MG tablet Take 1 tablet (40 mg total) by mouth at bedtime. 90 tablet 1   tiZANidine (ZANAFLEX) 2 MG tablet Take 2 mg by mouth at bedtime.      traMADol (ULTRAM) 50 MG tablet Take 100 mg by mouth at bedtime.      ULTRACARE PEN NEEDLES 33G X 4 MM MISC      zinc gluconate 50 MG tablet Take 50  mg by mouth daily.     No current facility-administered medications for this visit.    OBJECTIVE: Vitals:   02/02/22 1511  BP: 113/70  Pulse: 97  Temp: 98.7 F (37.1 C)     Body mass index is 42.35 kg/m.    ECOG FS:0 - Asymptomatic  General: Well-developed, well-nourished, no acute distress. Eyes: Pink conjunctiva, anicteric sclera. HEENT: Normocephalic, moist mucous membranes. Breasts: Exam deferred today. Lungs: No audible wheezing or coughing. Heart: Regular rate and rhythm. Abdomen: Soft, nontender, no obvious distention. Musculoskeletal: No edema, cyanosis, or clubbing. Neuro: Alert, answering all questions appropriately. Cranial nerves grossly intact. Skin: No rashes or petechiae noted. Psych: Normal affect. Lymphatics: No cervical, calvicular, axillary or inguinal LAD.   LAB RESULTS:  Lab Results  Component Value Date   NA 141 01/22/2022   K 4.3 01/22/2022   CL 99 01/22/2022   CO2 24 01/22/2022   GLUCOSE 218 (H) 01/22/2022   BUN 14 01/22/2022   CREATININE 0.99 01/22/2022   CALCIUM 8.9 01/22/2022   PROT 6.4 01/22/2022   ALBUMIN 4.0 01/22/2022   AST 17 01/22/2022   ALT 19 01/22/2022   ALKPHOS 37 (L) 01/22/2022   BILITOT 0.2 01/22/2022   GFRNONAA 61 12/01/2020   GFRAA 70 12/01/2020    Lab Results  Component Value Date   WBC 5.7 01/22/2022   NEUTROABS 3.0 01/22/2022   HGB 13.7 01/22/2022   HCT 42.9 01/22/2022   MCV 86 01/22/2022   PLT 244 01/22/2022     STUDIES: No results found.  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  PLAN:    1.  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  Patient reports  chemotherapy was discussed, but she ultimately declined treatment.  She did not require an  aromatase inhibitor.  Her most recent mammogram on June 15, 2021 completed at [facility] was reported as BI-RADS 2.  Repeat in July 2023.  It is unclear if she has had genetic testing given the triple negative status of her disease.  No intervention is needed at that time.  Return to clinic in 1 year for routine evaluation.  I spent a total of 45 minutes reviewing chart data, face-to-face evaluation with the patient, counseling and coordination of care as detailed above.   Patient expressed understanding and was in agreement with this plan. She also understands that She can call clinic at any time with any questions, concerns, or complaints.    Cancer Staging  Invasive ductal carcinoma of breast, female, left [facility]) Staging form: Breast, AJCC 8th Edition - Pathologic stage from 02/02/2022: Stage IB (pT1c, pN0, cM0, G3, ER-, PR-, HER2-) - Signed by Lloyd Huger, MD on 02/02/2022 Stage prefix: Initial diagnosis Histologic grading system: 3 grade system   Lloyd Huger, MD   02/02/2022 4:13 PM

## 2022-01-29 ENCOUNTER — Telehealth: Payer: Self-pay

## 2022-01-29 NOTE — Telephone Encounter (Signed)
Called patient to confirm appointment and ask if she knew where she was coming. No answer and could not leave a VM. ? ?

## 2022-02-02 ENCOUNTER — Encounter: Payer: Self-pay | Admitting: Oncology

## 2022-02-02 ENCOUNTER — Inpatient Hospital Stay: Payer: Medicare Other

## 2022-02-02 ENCOUNTER — Inpatient Hospital Stay: Payer: Medicare Other | Attending: Oncology | Admitting: Oncology

## 2022-02-02 ENCOUNTER — Other Ambulatory Visit: Payer: Self-pay

## 2022-02-02 VITALS — BP 113/70 | HR 97 | Temp 98.7°F | Wt 220.5 lb

## 2022-02-02 DIAGNOSIS — I10 Essential (primary) hypertension: Secondary | ICD-10-CM | POA: Diagnosis not present

## 2022-02-02 DIAGNOSIS — Z171 Estrogen receptor negative status [ER-]: Secondary | ICD-10-CM

## 2022-02-02 DIAGNOSIS — Z803 Family history of malignant neoplasm of breast: Secondary | ICD-10-CM

## 2022-02-02 DIAGNOSIS — Z87891 Personal history of nicotine dependence: Secondary | ICD-10-CM | POA: Diagnosis not present

## 2022-02-02 DIAGNOSIS — Z808 Family history of malignant neoplasm of other organs or systems: Secondary | ICD-10-CM

## 2022-02-02 DIAGNOSIS — C50912 Malignant neoplasm of unspecified site of left female breast: Secondary | ICD-10-CM

## 2022-02-02 DIAGNOSIS — E119 Type 2 diabetes mellitus without complications: Secondary | ICD-10-CM | POA: Diagnosis not present

## 2022-02-02 DIAGNOSIS — Z8042 Family history of malignant neoplasm of prostate: Secondary | ICD-10-CM | POA: Diagnosis not present

## 2022-02-02 DIAGNOSIS — C50412 Malignant neoplasm of upper-outer quadrant of left female breast: Secondary | ICD-10-CM | POA: Diagnosis not present

## 2022-02-04 ENCOUNTER — Other Ambulatory Visit: Payer: Self-pay

## 2022-02-04 ENCOUNTER — Other Ambulatory Visit: Payer: Self-pay | Admitting: Family Medicine

## 2022-02-04 ENCOUNTER — Ambulatory Visit
Admission: RE | Admit: 2022-02-04 | Discharge: 2022-02-04 | Disposition: A | Discharge status: Home / Self Care | Payer: Medicare Other | Source: Ambulatory Visit | Attending: Family Medicine | Admitting: Family Medicine

## 2022-02-04 DIAGNOSIS — R1011 Right upper quadrant pain: Secondary | ICD-10-CM | POA: Diagnosis not present

## 2022-02-04 DIAGNOSIS — K8020 Calculus of gallbladder without cholecystitis without obstruction: Secondary | ICD-10-CM

## 2022-02-04 DIAGNOSIS — K760 Fatty (change of) liver, not elsewhere classified: Secondary | ICD-10-CM | POA: Diagnosis not present

## 2022-02-11 ENCOUNTER — Encounter: Payer: Self-pay | Admitting: Surgery

## 2022-02-11 ENCOUNTER — Ambulatory Visit: Payer: Medicare Other | Admitting: Surgery

## 2022-02-11 ENCOUNTER — Other Ambulatory Visit: Payer: Self-pay

## 2022-02-11 VITALS — BP 140/81 | HR 89 | Temp 98.9°F | Ht 60.0 in | Wt 218.6 lb

## 2022-02-11 DIAGNOSIS — K8010 Calculus of gallbladder with chronic cholecystitis without obstruction: Secondary | ICD-10-CM | POA: Diagnosis not present

## 2022-02-11 NOTE — Patient Instructions (Signed)
Our surgery scheduler will call you within 24-48 hours to schedule your surgery.Please Have the Blue surgery sheet available when speaking with her.  ? ?Minimally Invasive Cholecystectomy ?Minimally invasive cholecystectomy is surgery to remove the gallbladder. The gallbladder is a pear-shaped organ that lies beneath the liver on the right side of the body. The gallbladder stores bile, which is a fluid that helps the body digest fats. Cholecystectomy is often done to treat inflammation (irritation and swelling) of the gallbladder (cholecystitis). This condition is usually caused by a buildup of gallstones (cholelithiasis) in the gallbladder or when the fluid in the gall bladder becomes stagnant because gallstones get stuck in the ducts (tubes) and block the flow of bile. This can result in inflammation and pain. In severe cases, emergency surgery may be required. ?This procedure is done through small incisions in the abdomen, instead of one large incision. It is also called laparoscopic surgery. A thin scope with a camera (laparoscope) is inserted through one incision. Then surgical instruments are inserted through the other incisions. In some cases, a minimally invasive surgery may need to be changed to a surgery that is done through a larger incision. This is called open surgery. ?Tell a health care provider about: ?Any allergies you have. ?All medicines you are taking, including vitamins, herbs, eye drops, creams, and over-the-counter medicines. ?Any problems you or family members have had with anesthetic medicines. ?Any bleeding problems you have. ?Any surgeries you have had. ?Any medical conditions you have. ?Whether you are pregnant or may be pregnant. ?What are the risks? ?Generally, this is a safe procedure. However, problems may occur, including: ?Infection. ?Bleeding. ?Allergic reactions to medicines. ?Damage to nearby structures or organs. ?A gallstone remaining in the common bile duct. The common bile  duct carries bile from the gallbladder to the small intestine. ?A bile leak from the liver or cystic duct after your gallbladder is removed. ?What happens before the procedure? ?When to stop eating and drinking ?Follow instructions from your health care provider about what you may eat and drink before your procedure. These may include: ?8 hours before the procedure ?Stop eating most foods. Do not eat meat, fried foods, or fatty foods. ?Eat only light foods, such as toast or crackers. ?All liquids are okay except energy drinks and alcohol. ?6 hours before the procedure ?Stop eating. ?Drink only clear liquids, such as water, clear fruit juice, black coffee, plain tea, and sports drinks. ?Do not drink energy drinks or alcohol. ?2 hours before the procedure ?Stop drinking all liquids. ?You may be allowed to take medicines with small sips of water. ?If you do not follow your health care provider's instructions, your procedure may be delayed or canceled. ?Medicines ?Ask your health care provider about: ?Changing or stopping your regular medicines. This is especially important if you are taking diabetes medicines or blood thinners. ?Taking medicines such as aspirin and ibuprofen. These medicines can thin your blood. Do not take these medicines unless your health care provider tells you to take them. ?Taking over-the-counter medicines, vitamins, herbs, and supplements. ?General instructions ?If you will be going home right after the procedure, plan to have a responsible adult: ?Take you home from the hospital or clinic. You will not be allowed to drive. ?Care for you for the time you are told. ?Do not use any products that contain nicotine or tobacco for at least 4 weeks before the procedure. These products include cigarettes, chewing tobacco, and vaping devices, such as e-cigarettes. If you need help quitting,  ask your health care provider. ?Ask your health care provider: ?How your surgery site will be marked. ?What steps  will be taken to help prevent infection. These may include: ?Removing hair at the surgery site. ?Washing skin with a germ-killing soap. ?Taking antibiotic medicine. ?What happens during the procedure? ? ?An IV will be inserted into one of your veins. ?You will be given one or both of the following: ?A medicine to help you relax (sedative). ?A medicine to make you fall asleep (general anesthetic). ?Your surgeon will make several small incisions in your abdomen. ?The laparoscope will be inserted through one of the small incisions. The camera on the laparoscope will send images to a monitor in the operating room. This lets your surgeon see inside your abdomen. ?A gas will be pumped into your abdomen. This will expand your abdomen to give the surgeon more room to perform the surgery. ?Other tools that are needed for the procedure will be inserted through the other incisions. The gallbladder will be removed through one of the incisions. ?Your common bile duct may be examined. If stones are found in the common bile duct, they may be removed. ?After your gallbladder has been removed, the incisions will be closed with stitches (sutures), staples, or skin glue. ?Your incisions will be covered with a bandage (dressing). ?The procedure may vary among health care providers and hospitals. ?What happens after the procedure? ?Your blood pressure, heart rate, breathing rate, and blood oxygen level will be monitored until you leave the hospital or clinic. ?You will be given medicines as needed to control your pain. ?You may have a drain placed in the incision. The drain will be removed a day or two after the procedure. ?Summary ?Minimally invasive cholecystectomy, also called laparoscopic cholecystectomy, is surgery to remove the gallbladder using small incisions. ?Tell your health care provider about all the medical conditions you have and all the medicines you are taking for those conditions. ?Before the procedure, follow  instructions about when to stop eating and drinking and changing or stopping medicines. ?Plan to have a responsible adult care for you for the time you are told after you leave the hospital or clinic. ?This information is not intended to replace advice given to you by your health care provider. Make sure you discuss any questions you have with your health care provider. ?Document Revised: 05/19/2021 Document Reviewed: 05/19/2021 ?Elsevier Patient Education ? [place]. ? ?

## 2022-02-11 NOTE — Progress Notes (Signed)
Patient ID: Sierra Copeland, female   DOB: [birth year], 66 y.o.   MRN: [id number] ? ?Chief Complaint: Gallstones ? ?History of Present Illness ?Sierra Copeland is a 66 y.o. female with prior history of gallstones, which she reports an attempted laparoscopic cholecystectomy with that was aborted years ago.  She is concerned as her mother had gallbladder cancer.  She reports she still has right upper quadrant discomfort, and postprandial nausea. ? ? ?From a chart note dated 09/19/2017: ?Sierra Copeland presents for a post-operative clinic visit following recent laparoscopy. She was recently in the office for evaluation of history of epigastric pains which occurred in January February and June. Ultrasound demonstrated gallstones with markedly thickened gallbladder wall. ? ?At the time of laparoscopy we found that there were multiple dense friable adhesions obscuring the gallbladder. Dissection caused bleeding and spent much time controlling bleeding. A decision was made to abort the operation as it appeared that if the operation were to continue it would be necessary to open. It also appeared that it may be a high risk operation. She went home the same day. ? ?She reports that surgery she has done well she has had no subsequent pains likes what she had in June. She reports she is eating satisfactorily. She is back to her usual work activities.  ? ?Follow up imaging:  ?Impression: ?1. The gallbladder is decompressed with mild wall thickening and ?surrounding hepatic perfusional change, without active surrounding ?inflammatory changes or fluid collections. There is a tiny area of linear ?low attenuation adjacent or within the gallbladder wall, which may reflect ?sequelae of prior inflammation or focal adenomyomatosis. In addition, the ?proximal duodenum appears somewhat tethered towards the gallbladder, and ?while no definitive fistulous tract is identified, an underlying fistula is ?not excluded. Consider upper GI for further  evaluation.  ?2. Otherwise no CT findings to account for abdominal pain. ? ?Electronically Reviewed by: Sunday Spillers, MD, [facility] ?Electronically Reviewed on: 12/08/2017 12:35 PM ? ? ?Past Medical History ?Past Medical History:  ?Diagnosis Date  ? Abnormal mammogram   ? Anxiety   ? Arthritis   ? Back pain   ? spinal stenosis and neck bone spur  ? Cancer [facility])   ? Breast  ? Car sickness   ? Collagenous colitis   ? Depression   ? Diabetes mellitus without complication ([place])   ? GERD (gastroesophageal reflux disease)   ? occ-no meds  ? Hair loss   ? TAKING THYROTAIN  ? Headache   ? migraines  ? Hyperlipemia   ? Hypertension   ? Osteoarthritis   ? seen by Rheum, not enoug evidence to support RA  ? Pinched nerve   ? PONV (postoperative nausea and vomiting)   ? after tonsillectomy  ? Rosacea   ? Seborrheic dermatitis   ? Sleep apnea   ? 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 -history of a stage I left breast cancer. Tumor is triple negative. She is dated radiation in August 2018. Continues to have problems with lymphedema  ? Wears glasses   ?  ? ? ?Past Surgical History:  ?Procedure Laterality Date  ? ACHILLES TENDON REPAIR  2007  ? right  ? BREAST LUMPECTOMY  2018  ? BREAST LUMPECTOMY Left 03/2017  ? BREAST SURGERY  2011  ? lt br bx-non cancer  ? CARPAL TUNNEL RELEASE    ? right and left  ? CHOLECYSTECTOMY N/A 09/06/2017  ? Gallbladder not removed due to complications. Procedure: LAPAROSCOPY;  Surgeon:  Leonie Green, MD;  Location: [facility] ORS;  Service: General;  Laterality: N/A;  ? COLONOSCOPY    ? COLONOSCOPY WITH PROPOFOL N/A 09/24/2021  ? Procedure: COLONOSCOPY WITH PROPOFOL;  Surgeon: Annamaria Helling, DO;  Location: [facility] ENDOSCOPY;  Service: Gastroenterology;  Laterality: N/A;  IDDM  ? epidural steroid injection x2  2017  ? ESOPHAGOGASTRODUODENOSCOPY N/A 01/21/2016  ? Procedure: ESOPHAGOGASTRODUODENOSCOPY (EGD);  Surgeon: Hulen Luster, MD;  Location: [facility];  Service: Gastroenterology;   Laterality: N/A;  Please call at work [phone] ?Diabetic - insulin  ? ESOPHAGOGASTRODUODENOSCOPY N/A 09/24/2021  ? Procedure: ESOPHAGOGASTRODUODENOSCOPY (EGD);  Surgeon: Annamaria Helling, DO;  Location: [facility] ENDOSCOPY;  Service: Gastroenterology;  Laterality: N/A;  ? FINGER EXPLORATION  2013  ? left long   ? MASTECTOMY Left   ? Partial  ? NERVE EXPLORATION Left 10/03/2013  ? Procedure: NERVE EXPLORATION/ LEFT LONG RADIAL DISTAL NERVE neurolysis;  Surgeon: Schuyler Amor, MD;  Location: [facility];  Service: Orthopedics;  Laterality: Left;  ? SHOULDER ARTHROSCOPY WITH DEBRIDEMENT AND BICEP TENDON REPAIR Right 05/13/2016  ? Procedure: Arthroscopic debridement, open decompression, rotator cuff repair, tenodesis,right shoulder.;  Surgeon: Corky Mull, MD;  Location: [facility] ORS;  Service: Orthopedics;  Laterality: Right;  ? TONSILLECTOMY    ? UVULOPALATOPHARYNGOPLASTY  1998  ? and tonsils-tongue pexi  ?  BREAST BIOPSY Left 03/14/2017     ? ? ?Allergies  ?Allergen Reactions  ? Prednisone Palpitations  ?  Increases BG and HR.  ? ? ?Current Outpatient Medications  ?Medication Sig Dispense Refill  ? amLODipine (NORVASC) 2.5 MG tablet Take 1 tablet (2.5 mg total) by mouth daily. 90 tablet 1  ? Cholecalciferol (VITAMIN D3) 2000 units capsule Take 2,000 Units by mouth at bedtime.     ? Coenzyme Q10 (COQ-10) 30 MG CAPS Take by mouth.    ? Continuous Blood Gluc Sensor (FREESTYLE LIBRE 14 DAY SENSOR) MISC 1 each by Does not apply route every 14 (fourteen) days. 12 each 3  ? Dulaglutide (TRULICITY) 1.5 UL/8.4TX SOPN Inject 1.5 mg into the skin once a week. 12 mL 1  ? escitalopram (LEXAPRO) 20 MG tablet Take 1 tablet (20 mg total) by mouth daily. 90 tablet 1  ? fexofenadine (ALLEGRA) 180 MG tablet Take 180 mg by mouth daily with breakfast.     ? fluticasone (FLONASE) 50 MCG/ACT nasal spray Place 1-2 sprays into both nostrils every evening.     ? gabapentin (NEURONTIN) 100 MG capsule Take 1 capsule (100 mg  total) by mouth 2 (two) times daily. 180 capsule 1  ? HUMALOG KWIKPEN 100 UNIT/ML KwikPen INJECT 10-20 UNITS INTO THE SKIN WITH BREAKFAST, WITH LUNCH AND WITH EVENING MEAL 54 mL 1  ? Hyoscyamine Sulfate SL 0.125 MG SUBL Place under the tongue.    ? ibuprofen (ADVIL,MOTRIN) 200 MG tablet Take 800 mg by mouth every 8 (eight) hours as needed (for pain.).     ? insulin glargine, 1 Unit Dial, (TOUJEO SOLOSTAR) 300 UNIT/ML Solostar Pen 80 units at bedtime and 20 units at lunch time 10 mL 12  ? Insulin Syringe-Needle U-100 30G X 5/16" 0.3 ML MISC     ? lisinopril (ZESTRIL) 20 MG tablet Take 1 tablet (20 mg total) by mouth daily. 90 tablet 1  ? Magnesium 250 MG TABS Take by mouth.    ? metFORMIN (GLUCOPHAGE) 1000 MG tablet Take 1 tablet (1,000 mg total) by mouth 2 (two) times daily with a meal. 180 tablet 1  ?  omeprazole (PRILOSEC) 40 MG capsule Take 1 capsule (40 mg total) by mouth daily. 90 capsule 1  ? simvastatin (ZOCOR) 40 MG tablet Take 1 tablet (40 mg total) by mouth at bedtime. 90 tablet 1  ? tiZANidine (ZANAFLEX) 2 MG tablet Take 2 mg by mouth at bedtime.     ? traMADol (ULTRAM) 50 MG tablet Take 100 mg by mouth at bedtime.     ? ULTRACARE PEN NEEDLES 33G X 4 MM MISC     ? zinc gluconate 50 MG tablet Take 50 mg by mouth daily.    ? ?No current facility-administered medications for this visit.  ? ? ?Family History ?Family History  ?Problem Relation Age of Onset  ? Liver cancer Mother   ? Hypertension Mother   ? Hyperlipidemia Mother   ? Hyperlipidemia Father   ? Hypertension Father   ? Dementia Maternal Grandmother   ? Stroke Maternal Grandfather   ? Diabetes Paternal Grandmother   ? Heart disease Paternal Grandfather   ? Diabetes Maternal Aunt   ? Diabetes Maternal Uncle   ? Heart disease Maternal Uncle   ? Breast cancer Paternal Aunt   ? Diabetes Paternal Aunt   ? Breast cancer Paternal Aunt   ? Diabetes Paternal Uncle   ? Prostate cancer Paternal Uncle   ?     prostate  ? Other Cousin 61  ?     BRCA or similar  mutation positive  ?  ? ? ?Social History ?Social History  ? ?Tobacco Use  ? Smoking status: Former  ?  Packs/day: 1.00  ?  Years: 10.00  ?  Pack years: 10.00  ?  Types: Cigarettes  ?  Quit date: 10/31

## 2022-02-12 ENCOUNTER — Telehealth: Payer: Self-pay

## 2022-02-12 NOTE — Telephone Encounter (Signed)
Per Dr Christian Mate would like for the patient to have a CT scan done prior to surgery. Spoke with the patient and let her know she is scheduled for a CT of the abdomen and pelvis at [facility] on 02/17/22 at 9:30 am. [place] medical mall by 9:00 am and have nothing to eat or drink prior. She is aware to pick up a prep kit prior to her scan.  ?

## 2022-02-15 ENCOUNTER — Telehealth: Payer: Self-pay | Admitting: Surgery

## 2022-02-15 NOTE — Telephone Encounter (Signed)
Outgoing call is made, left message for patient to call, please inform of the following scheduled surgery:  ? ?Pre-Admission date/time, COVID Testing date and Surgery date. ? ?Surgery Date: 02/24/22 ?Preadmission Testing Date: 02/17/22 (phone 8a-1p) ?Covid Testing Date: Not needed.    ? ?Also patient will need to call at [phone], between 1-3:00pm the day before surgery, to find out what time to arrive for surgery.   ? ?

## 2022-02-15 NOTE — Telephone Encounter (Signed)
Patient calls back, she is now aware of all dates regarding her surgery and verbalized understanding.  ?

## 2022-02-17 ENCOUNTER — Inpatient Hospital Stay
Admission: RE | Admit: 2022-02-17 | Discharge: 2022-02-17 | Disposition: A | Discharge status: Home / Self Care | Payer: Disability Insurance | Source: Ambulatory Visit

## 2022-02-17 ENCOUNTER — Ambulatory Visit: Payer: Self-pay | Admitting: Surgery

## 2022-02-17 ENCOUNTER — Ambulatory Visit
Admission: RE | Admit: 2022-02-17 | Discharge: 2022-02-17 | Disposition: A | Discharge status: Home / Self Care | Payer: Medicare Other | Source: Ambulatory Visit | Attending: Surgery | Admitting: Surgery

## 2022-02-17 ENCOUNTER — Other Ambulatory Visit: Payer: Self-pay

## 2022-02-17 DIAGNOSIS — K8010 Calculus of gallbladder with chronic cholecystitis without obstruction: Secondary | ICD-10-CM

## 2022-02-17 DIAGNOSIS — I700 Atherosclerosis of aorta: Secondary | ICD-10-CM | POA: Diagnosis not present

## 2022-02-17 MED ORDER — IOHEXOL 300 MG/ML  SOLN
100.0000 mL | Freq: Once | INTRAMUSCULAR | Status: AC | PRN
  Administered 2022-02-17: 100 mL via INTRAVENOUS

## 2022-02-17 NOTE — Patient Instructions (Signed)
?Your procedure is scheduled on: Wednesday February 24, 2022. ?Report to Day Surgery inside [place] 2nd floor, stop by admissions desk before getting on elevator. ?To find out your arrival time please call [phone] between 1PM - 3PM on Tuesday February 23, 2022. ? ?Remember: Instructions that are not followed completely may result in serious medical risk,  ?up to and including death, or upon the discretion of your surgeon and anesthesiologist your  ?surgery may need to be rescheduled.  ? ?  _X__ 1. Do not eat food or drink fluids after midnight the night before your procedure. ?                No chewing gum or hard candies.  ? ?__X__2.  On the morning of surgery brush your teeth with toothpaste and water, you ?               may rinse your mouth with mouthwash if you wish.  Do not swallow any toothpaste or mouthwash. ?   ? _X__ 3.  No Alcohol for 24 hours before or after surgery. ? ? _X__ 4.  Do Not Smoke or use e-cigarettes For 24 Hours Prior to Your Surgery. ?                Do not use any chewable tobacco products for at least 6 hours prior to ?                Surgery. ? ?_X__  5.  Do not use any recreational drugs (marijuana, cocaine, heroin, ecstasy, MDMA or other) ?               For at least one week prior to your surgery.  Combination of these drugs with anesthesia ?               May have life threatening results. ? ?____  6.  Bring all medications with you on the day of surgery if instructed.  ? ?__X__  7.  Notify your doctor if there is any change in your medical condition  ?    (cold, fever, infections). ?    ?Do not wear jewelry, make-up, hairpins, clips or nail polish. ?Do not wear lotions, powders, or perfumes. You may wear deodorant. ?Do not shave 48 hours prior to surgery. Men may shave face and neck. ?Do not bring valuables to the hospital.   ? ?[facility] is not responsible for any belongings or valuables. ? ?Contacts, dentures or bridgework may not be worn into  surgery. ?Leave your suitcase in the car. After surgery it may be brought to your room. ?For patients admitted to the hospital, discharge time is determined by your ?treatment team. ?  ?Patients discharged the day of surgery will not be allowed to drive home.   ?Make arrangements for someone to be with you for the first 24 hours of your ?Same Day Discharge. ? ? ?__X__ Take these medicines the morning of surgery with A SIP OF WATER:  ? ? 1. omeprazole (PRILOSEC) 40 MG ? 2.  ? 3.  ? 4. ? 5. ? 6. ? ?____ Fleet Enema (as directed)  ? ?____ Use CHG Soap (or wipes) as directed ? ?____ Use Benzoyl Peroxide Gel as instructed ? ?____ Use inhalers on the day of surgery ? ?__X__ Stop metFORMIN (GLUCOPHAGE) 1000 MG  2 days prior to surgery   ? ?__X__ Take 1/2 of usual insulin dose the night before surgery. NO insulin the morning ?  of surgery. insulin glargine, 1 Unit Dial, (TOUJEO SOLOSTAR) 300 UNIT/ML Solostar Pen  ? ?____ Call your PCP, cardiologist, or Pulmonologist if taking Coumadin/Plavix/aspirin and ask when to stop before your surgery.  ? ?__X__ One Week prior to surgery- Stop Anti-inflammatories such as Ibuprofen, Aleve, Advil, Motrin, meloxicam (MOBIC), diclofenac, etodolac, ketorolac, Toradol, Daypro, piroxicam, Goody's or BC powders. OK TO USE TYLENOL IF NEEDED ?  ?__X__ One Week prior to surgery- Stop ALL supplements until after surgery.   ? ?____ Bring C-Pap to the hospital.  ? ? ?If you have any questions regarding your pre-procedure instructions,  ?Please call Pre-admit Testing at [phone] ?

## 2022-02-18 ENCOUNTER — Other Ambulatory Visit
Admission: RE | Admit: 2022-02-18 | Discharge: 2022-02-18 | Disposition: A | Discharge status: Home / Self Care | Payer: Disability Insurance | Source: Ambulatory Visit | Attending: Surgery | Admitting: Surgery

## 2022-02-18 NOTE — Patient Instructions (Addendum)
?Your procedure is scheduled on: Wednesday February 24, 2022. ?Report to Day Surgery inside [facility] 2nd floor, stop by admissions desk before getting on elevator. ?To find out your arrival time please call [phone] between 1PM - 3PM on Tuesday February 23, 2022. ? ?Remember: Instructions that are not followed completely may result in serious medical risk,  ?up to and including death, or upon the discretion of your surgeon and anesthesiologist your  ?surgery may need to be rescheduled.  ? ?  _X__ 1. Do not eat food after midnight the night before your procedure. ?                No chewing gum or hard candies.  ? ?__X__2.  On the morning of surgery brush your teeth with toothpaste and water, you ?               may rinse your mouth with mouthwash if you wish.  Do not swallow any toothpaste or mouthwash. ?   ? _X__ 3.  No Alcohol for 24 hours before or after surgery. ? ? _X__ 4.  Do Not Smoke or use e-cigarettes For 24 Hours Prior to Your Surgery. ?                Do not use any chewable tobacco products for at least 6 hours prior to ?                Surgery. ? ?_X__  5.  Do not use any recreational drugs (marijuana, cocaine, heroin, ecstasy, MDMA or other) ?               For at least one week prior to your surgery.  Combination of these drugs with anesthesia ?               May have life threatening results. ? ?____  6.  Bring all medications with you on the day of surgery if instructed.  ? ?__X__  7.  Notify your doctor if there is any change in your medical condition  ?    (cold, fever, infections). ?    ?Do not wear jewelry, make-up, hairpins, clips or nail polish. ?Do not wear lotions, powders, or perfumes. You may wear deodorant. ?Do not shave 48 hours prior to surgery.  ?Do not bring valuables to the hospital.   ? ?[facility] is not responsible for any belongings or valuables. ? ?Contacts, dentures or bridgework may not be worn into surgery. ?Leave your suitcase in the car. After  surgery it may be brought to your room. ?For patients admitted to the hospital, discharge time is determined by your ?treatment team. ?  ?Patients discharged the day of surgery will not be allowed to drive home.   ?Make arrangements for someone to be with you for the first 24 hours of your ?Same Day Discharge. ? ? ?__X__ Take these medicines the morning of surgery with A SIP OF WATER:  ? ? 1. escitalopram (LEXAPRO) 20 MG ? 2. fexofenadine (ALLEGRA) 180 MG ? 3. gabapentin (NEURONTIN) 200 MG ? 4. omeprazole (PRILOSEC) 40 MG ? 5. ? 6. ? ?____ Fleet Enema (as directed)  ? ?__X__ Use CHG Soap (or wipes) as directed ? ?____ Use Benzoyl Peroxide Gel as instructed ? ?____ Use inhalers on the day of surgery ? ?__X__ Stop metformin 2 days prior to surgery (take last dose Sunday 02/21/22)   ? ?__X__ Take 1/2 of usual insulin dose the night before surgery. NO  insulin the morning ?         of surgery. insulin glargine, 1 Unit Dial, (TOUJEO SOLOSTAR) 300 UNIT/ML Solostar Pen ? ?____ Call your PCP, cardiologist, or Pulmonologist if taking Coumadin/Plavix/aspirin and ask when to stop before your surgery.  ? ?__X__ One Week prior to surgery- Stop Anti-inflammatories such as Ibuprofen, Aleve, Advil, Motrin, meloxicam (MOBIC), diclofenac, etodolac, ketorolac, Toradol, Daypro, piroxicam, Excedrin, aspirin, Goody's or BC powders. OK TO USE TYLENOL IF NEEDED ?  ?__X__ One week prior to surgery- Stop ALL supplements until after surgery. Zinc, magnesium, CO-Q10.   ? ?____ Bring C-Pap to the hospital.  ? ? ?If you have any questions regarding your pre-procedure instructions,  ?Please call Pre-admit Testing at [phone] ?

## 2022-02-19 ENCOUNTER — Other Ambulatory Visit
Admission: RE | Admit: 2022-02-19 | Discharge: 2022-02-19 | Disposition: A | Discharge status: Home / Self Care | Payer: Medicare Other | Source: Ambulatory Visit | Attending: Surgery | Admitting: Surgery

## 2022-02-19 ENCOUNTER — Other Ambulatory Visit: Payer: Self-pay

## 2022-02-19 DIAGNOSIS — K8010 Calculus of gallbladder with chronic cholecystitis without obstruction: Secondary | ICD-10-CM | POA: Insufficient documentation

## 2022-02-19 DIAGNOSIS — I10 Essential (primary) hypertension: Secondary | ICD-10-CM | POA: Insufficient documentation

## 2022-02-19 LAB — CBC WITH DIFFERENTIAL/PLATELET
Abs Immature Granulocytes: 0.01 10*3/uL (ref 0.00–0.07)
Basophils Absolute: 0 10*3/uL (ref 0.0–0.1)
Basophils Relative: 1 %
Eosinophils Absolute: 0.2 10*3/uL (ref 0.0–0.5)
Eosinophils Relative: 4 %
HCT: 41.4 % (ref 36.0–46.0)
Hemoglobin: 13.1 g/dL (ref 12.0–15.0)
Immature Granulocytes: 0 %
Lymphocytes Relative: 28 %
Lymphs Abs: 1.6 10*3/uL (ref 0.7–4.0)
MCH: 27.5 pg (ref 26.0–34.0)
MCHC: 31.6 g/dL (ref 30.0–36.0)
MCV: 86.8 fL (ref 80.0–100.0)
Monocytes Absolute: 0.4 10*3/uL (ref 0.1–1.0)
Monocytes Relative: 7 %
Neutro Abs: 3.3 10*3/uL (ref 1.7–7.7)
Neutrophils Relative %: 60 %
Platelets: 208 10*3/uL (ref 150–400)
RBC: 4.77 MIL/uL (ref 3.87–5.11)
RDW: 13.6 % (ref 11.5–15.5)
WBC: 5.5 10*3/uL (ref 4.0–10.5)
nRBC: 0 % (ref 0.0–0.2)

## 2022-02-19 LAB — COMPREHENSIVE METABOLIC PANEL
ALT: 26 U/L (ref 0–44)
AST: 28 U/L (ref 15–41)
Albumin: 3.4 g/dL — ABNORMAL LOW (ref 3.5–5.0)
Alkaline Phosphatase: 33 U/L — ABNORMAL LOW (ref 38–126)
Anion gap: 13 (ref 5–15)
BUN: 14 mg/dL (ref 8–23)
CO2: 25 mmol/L (ref 22–32)
Calcium: 9.2 mg/dL (ref 8.9–10.3)
Chloride: 98 mmol/L (ref 98–111)
Creatinine, Ser: 0.99 mg/dL (ref 0.44–1.00)
GFR, Estimated: >60 mL/min (ref >60)
Glucose, Bld: 203 mg/dL — ABNORMAL HIGH (ref 70–99)
Potassium: 4.4 mmol/L (ref 3.5–5.1)
Sodium: 136 mmol/L (ref 135–145)
Total Bilirubin: 0.7 mg/dL (ref 0.3–1.2)
Total Protein: 6.9 g/dL (ref 6.5–8.1)

## 2022-02-22 ENCOUNTER — Telehealth: Payer: Self-pay | Admitting: Surgery

## 2022-02-22 NOTE — Telephone Encounter (Signed)
Per Dr. Christian Mate, we will need to cancel patient's surgery for robotic cholecystectomy for 02/24/22 because patient's A-1C not in control.  He says that her hemoglobin A-1C needs to be less than 8 and in control.  Patient is called and made aware of this, she is informed to contact whomever is managing her diabetes.  Patient is made aware that this can put her at significant risk if not under control.  Patient is made aware that once managed with her diabetes, to contact us, we will then schedule follow up in office with Dr. Christian Mate.   ?

## 2022-02-23 DIAGNOSIS — M5412 Radiculopathy, cervical region: Secondary | ICD-10-CM | POA: Diagnosis not present

## 2022-02-23 DIAGNOSIS — M9903 Segmental and somatic dysfunction of lumbar region: Secondary | ICD-10-CM | POA: Diagnosis not present

## 2022-02-23 DIAGNOSIS — M9901 Segmental and somatic dysfunction of cervical region: Secondary | ICD-10-CM | POA: Diagnosis not present

## 2022-02-23 DIAGNOSIS — M5432 Sciatica, left side: Secondary | ICD-10-CM | POA: Diagnosis not present

## 2022-02-24 ENCOUNTER — Encounter: Admission: RE | Payer: Self-pay | Source: Home / Self Care

## 2022-02-24 ENCOUNTER — Ambulatory Visit: Admission: RE | Admit: 2022-02-24 | Payer: Medicare Other | Source: Home / Self Care | Admitting: Surgery

## 2022-02-24 SURGERY — CHOLECYSTECTOMY, ROBOT-ASSISTED, LAPAROSCOPIC
Anesthesia: General

## 2022-03-03 ENCOUNTER — Inpatient Hospital Stay: Payer: Medicare Other | Attending: Oncology | Admitting: Hospice and Palliative Medicine

## 2022-03-03 DIAGNOSIS — C50912 Malignant neoplasm of unspecified site of left female breast: Secondary | ICD-10-CM

## 2022-03-03 NOTE — Progress Notes (Signed)
Multidisciplinary Oncology Council Documentation ? ?Sierra Copeland was presented by our [facility] on 03/03/2022, which included representatives from:  ?[facility] ?Dietitian  ?Physical/Occupational Therapist ?Nurse Navigator ?Genetics ?Speech Therapist ?Social work ?Survivorship RN ?Financial Navigator ?Research RN ? ? ?Sierra Copeland currently presents with history of breast cancer. ? ?We reviewed previous medical and familial history, history of present illness, and recent lab results along with all available histopathologic and imaging studies. The [facility] considered available treatment options and made the following recommendations/referrals: ? ?- Genetics ? ?The MOC is a meeting of clinicians from various specialty areas who evaluate and discuss patients for whom a multidisciplinary approach is being considered. Final determinations in the plan of care are those of the provider(s).  ? ?Today's extended care, comprehensive team conference, Sierra Copeland was not present for the discussion and was not examined.   ?

## 2022-03-09 DIAGNOSIS — M9901 Segmental and somatic dysfunction of cervical region: Secondary | ICD-10-CM | POA: Diagnosis not present

## 2022-03-09 DIAGNOSIS — M5432 Sciatica, left side: Secondary | ICD-10-CM | POA: Diagnosis not present

## 2022-03-09 DIAGNOSIS — M5412 Radiculopathy, cervical region: Secondary | ICD-10-CM | POA: Diagnosis not present

## 2022-03-09 DIAGNOSIS — M9903 Segmental and somatic dysfunction of lumbar region: Secondary | ICD-10-CM | POA: Diagnosis not present

## 2022-03-18 ENCOUNTER — Encounter: Payer: Self-pay | Admitting: Licensed Clinical Social Worker

## 2022-03-18 ENCOUNTER — Inpatient Hospital Stay: Payer: Medicare Other

## 2022-03-18 ENCOUNTER — Inpatient Hospital Stay: Payer: Medicare Other | Admitting: Licensed Clinical Social Worker

## 2022-03-18 DIAGNOSIS — Z803 Family history of malignant neoplasm of breast: Secondary | ICD-10-CM

## 2022-03-18 DIAGNOSIS — Z800 Family history of malignant neoplasm of digestive organs: Secondary | ICD-10-CM | POA: Diagnosis not present

## 2022-03-18 DIAGNOSIS — C50912 Malignant neoplasm of unspecified site of left female breast: Secondary | ICD-10-CM

## 2022-03-18 NOTE — Progress Notes (Signed)
REFERRING PROVIDER: ?Sierra Huger, MD ?[address] [place] ?[place],  [place] [address] ? ?PRIMARY PROVIDER:  ?Sierra Roys, DO ? ?PRIMARY REASON FOR VISIT:  ?1. Invasive ductal carcinoma of breast, female, left ([place])   ?2. Family history of breast cancer   ?3. Family history of cancer of gallbladder   ? ? ? ?HISTORY OF PRESENT ILLNESS:   ?Sierra Copeland, a 66 y.o. female, was seen for a [facility] cancer genetics consultation at the request of Dr. Grayland Ormond due to a personal and family history of cancer.  Sierra Copeland presents to clinic today to discuss the possibility of a hereditary predisposition to cancer, genetic testing, and to further clarify her future cancer risks, as well as potential cancer risks for family members.  ? ?In 2018, at the age of 14, Sierra Copeland was diagnosed with invasive ductal carcinoma of the left breast, triple negative. She underwent lumpectomy in May and adjuvant radiation. She declined chemotherapy. She may have had genetic testing through [facility] in 2018 or earlier, if so results are not available in her chart.  ? ?CANCER HISTORY:  ?Oncology History  ?Invasive ductal carcinoma of breast, female, left ([place])  ?01/31/2018 Initial Diagnosis  ? Invasive ductal carcinoma of breast, female, left ([place]) ? ?  ?02/02/2022 Cancer Staging  ? Staging form: Breast, AJCC 8th Edition ?- Pathologic stage from 02/02/2022: Stage IB (pT1c, pN0, cM0, G3, ER-, PR-, HER2-) - Signed by Sierra Huger, MD on 02/02/2022 ?Stage prefix: Initial diagnosis ?Histologic grading system: 3 grade system ? ?  ? ? ? ?RISK FACTORS:  ?Menarche was at age 55.  ?OCP use for "short time" ?Ovaries intact: yes.  ?Hysterectomy: no.  ?Menopausal status: postmenopausal.  ?Colonoscopy: yes;  reports approximately 7 total colon polyps . ? ?Past Medical History:  ?Diagnosis Date  ? Abnormal mammogram   ? Anxiety   ? Arthritis   ? Back pain   ? spinal stenosis and neck bone spur  ? Cancer [facility])   ? Breast  ? Car sickness   ? Collagenous  colitis   ? Depression   ? Diabetes mellitus without complication ([place])   ? GERD (gastroesophageal reflux disease)   ? occ-no meds  ? Hair loss   ? TAKING THYROTAIN  ? Headache   ? migraines  ? Hyperlipemia   ? Hypertension   ? Osteoarthritis   ? seen by Rheum, not enoug evidence to support RA  ? Pinched nerve   ? PONV (postoperative nausea and vomiting)   ? after tonsillectomy  ? Rosacea   ? Seborrheic dermatitis   ? Sleep apnea   ? Triple negative breast cancer ([place])   ? -history of a stage I left breast cancer. Tumor is triple negative. She is dated radiation in August 2018. Continues to have problems with lymphedema  ? Wears glasses   ? ? ?Past Surgical History:  ?Procedure Laterality Date  ? ACHILLES TENDON REPAIR  2007  ? right  ? BREAST LUMPECTOMY  2018  ? BREAST LUMPECTOMY Left 03/2017  ? BREAST SURGERY  2011  ? lt br bx-non cancer  ? [place]  ? right and left  ? CHOLECYSTECTOMY N/A 09/06/2017  ? Gallbladder not removed due to complications. Procedure: LAPAROSCOPY;  Surgeon: Leonie Green, MD;  Location: [facility] ORS;  Service: General;  Laterality: N/A;  ? COLONOSCOPY    ? COLONOSCOPY WITH PROPOFOL N/A 09/24/2021  ? Procedure: COLONOSCOPY WITH PROPOFOL;  Surgeon: Annamaria Helling, DO;  Location: [facility] ENDOSCOPY;  Service: Gastroenterology;  Laterality: N/A;  IDDM  ? epidural steroid injection x2  2017  ? ESOPHAGOGASTRODUODENOSCOPY N/A 01/21/2016  ? Procedure: ESOPHAGOGASTRODUODENOSCOPY (EGD);  Surgeon: Hulen Luster, MD;  Location: [place];  Service: Gastroenterology;  Laterality: N/A;  Please call at work [phone] ?Diabetic - insulin  ? ESOPHAGOGASTRODUODENOSCOPY N/A 09/24/2021  ? Procedure: ESOPHAGOGASTRODUODENOSCOPY (EGD);  Surgeon: Annamaria Helling, DO;  Location: [facility];  Service: Gastroenterology;  Laterality: N/A;  ? FINGER EXPLORATION  2013  ? left long   ? MASTECTOMY Left 2018  ? Partial  ? NERVE EXPLORATION Left 10/03/2013  ? Procedure: NERVE  EXPLORATION/ LEFT LONG RADIAL DISTAL NERVE neurolysis;  Surgeon: Schuyler Amor, MD;  Location: [facility] [place];  Service: Orthopedics;  Laterality: Left;  ? SHOULDER ARTHROSCOPY WITH DEBRIDEMENT AND BICEP TENDON REPAIR Right 05/13/2016  ? Procedure: Arthroscopic debridement, open decompression, rotator cuff repair, tenodesis,right shoulder.;  Surgeon: Corky Mull, MD;  Location: [facility] ORS;  Service: Orthopedics;  Laterality: Right;  ? TONSILLECTOMY  2000  ? UVULOPALATOPHARYNGOPLASTY  1998  ? and tonsils-tongue pexi  ?  BREAST BIOPSY Left 03/14/2017     ? ? ?Social History  ? ?Socioeconomic History  ? Marital status: Married  ?  Spouse name: Not on file  ? Number of children: Not on file  ? Years of education: Not on file  ? Highest education level: Not on file  ?Occupational History  ? Not on file  ?Tobacco Use  ? Smoking status: Former  ?  Packs/day: 1.00  ?  Years: 10.00  ?  Pack years: 10.00  ?  Types: Cigarettes  ?  Quit date: 09/28/1986  ?  Years since quitting: 35.4  ? Smokeless tobacco: Never  ?Vaping Use  ? Vaping Use: Never used  ?Substance and Sexual Activity  ? Alcohol use: No  ? Drug use: No  ? Sexual activity: Yes  ?Other Topics Concern  ? Not on file  ?Social History Narrative  ? Not on file  ? ?Social Determinants of Health  ? ?Financial Resource Strain: Low Risk   ? Difficulty of Paying Living Expenses: Not hard at all  ?Food Insecurity: No Food Insecurity  ? Worried About Charity fundraiser in the Last Year: Never true  ? Ran Out of Food in the Last Year: Never true  ?Transportation Needs: No Transportation Needs  ? Lack of Transportation (Medical): No  ? Lack of Transportation (Non-Medical): No  ?Physical Activity: Inactive  ? Days of Exercise per Week: 0 days  ? Minutes of Exercise per Session: 0 min  ?Stress: No Stress Concern Present  ? Feeling of Stress : Not at all  ?Social Connections: Moderately Integrated  ? Frequency of Communication with Friends and Family: Twice a week  ?  Frequency of Social Gatherings with Friends and Family: Once a week  ? Attends Religious Services: More than 4 times per year  ? Active Member of Clubs or Organizations: No  ? Attends Archivist Meetings: Never  ? Marital Status: Married  ?  ? ?FAMILY HISTORY:  ?We obtained a detailed, 4-generation family history.  Significant diagnoses are listed below: ?Family History  ?Problem Relation Age of Onset  ? Liver cancer Mother 88  ?     gallbaldder  ? Hypertension Mother   ? Hyperlipidemia Mother   ? Hyperlipidemia Father   ? Hypertension Father   ? Diabetes Maternal Aunt   ? Diabetes Maternal Uncle   ?  Heart disease Maternal Uncle   ? Breast cancer Paternal Aunt   ? Diabetes Paternal Aunt   ? Breast cancer Paternal Aunt   ? Diabetes Paternal Uncle   ? Dementia Maternal Grandmother   ? Stroke Maternal Grandfather   ? Diabetes Paternal Grandmother   ? Heart disease Paternal Grandfather   ? Other Cousin 53  ?     BRCA or similar mutation positive  ? ?Sierra Copeland does not have children or siblings. ? ?Sierra Copeland mother had gallbladder cancer at 56 and died at 55. Patient had 1 maternal aunt, 1 uncle, no cancer. Grandparents passed over age 4. ? ?Sierra Copeland father died at 28. Patient had 14 paternal aunts/uncles. Two aunts had breast cancer, 1 aunt had unknown type cancer. A cousin also had breast cancer. Paternal grandmother passed at 79 and grandfather passed over age 31. ? ?Sierra Copeland is unaware of previous family history of genetic testing for hereditary cancer risks. Patient's maternal ancestors are of unknown descent, and paternal ancestors are of unknown descent. There is no reported Ashkenazi Jewish ancestry. There is no known consanguinity. ? ? ? ?GENETIC COUNSELING ASSESSMENT: Sierra Copeland is a 66 y.o. female with a personal and family history of cancer which is somewhat suggestive of a hereditary cancer syndrome and predisposition to cancer. We, therefore, discussed and recommended the following at  today's visit.  ? ?DISCUSSION: We discussed that approximately 10% of breast cancer is hereditary. Most cases of hereditary breast cancer are associated with BRCA1/BRCA2 genes, although there are other genes

## 2022-03-31 ENCOUNTER — Telehealth: Payer: Self-pay | Admitting: Licensed Clinical Social Worker

## 2022-03-31 NOTE — Telephone Encounter (Signed)
Disclosed positive genetic test result. 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 She requested to come in person to discuss further so her husband can be present. This is scheduled for 04/13/22 at 3 pm.  ? ? ?

## 2022-04-01 DIAGNOSIS — M5126 Other intervertebral disc displacement, lumbar region: Secondary | ICD-10-CM | POA: Diagnosis not present

## 2022-04-01 DIAGNOSIS — M5416 Radiculopathy, lumbar region: Secondary | ICD-10-CM | POA: Diagnosis not present

## 2022-04-06 DIAGNOSIS — M5412 Radiculopathy, cervical region: Secondary | ICD-10-CM | POA: Diagnosis not present

## 2022-04-06 DIAGNOSIS — M5432 Sciatica, left side: Secondary | ICD-10-CM | POA: Diagnosis not present

## 2022-04-06 DIAGNOSIS — M9903 Segmental and somatic dysfunction of lumbar region: Secondary | ICD-10-CM | POA: Diagnosis not present

## 2022-04-06 DIAGNOSIS — M9901 Segmental and somatic dysfunction of cervical region: Secondary | ICD-10-CM | POA: Diagnosis not present

## 2022-04-08 ENCOUNTER — Encounter: Payer: Self-pay | Admitting: Nurse Practitioner

## 2022-04-08 ENCOUNTER — Ambulatory Visit (INDEPENDENT_AMBULATORY_CARE_PROVIDER_SITE_OTHER): Payer: Medicare Other | Admitting: Nurse Practitioner

## 2022-04-08 VITALS — BP 137/80 | HR 96 | Temp 98.4°F | Ht 60.5 in | Wt 217.4 lb

## 2022-04-08 DIAGNOSIS — H6502 Acute serous otitis media, left ear: Secondary | ICD-10-CM | POA: Diagnosis not present

## 2022-04-08 MED ORDER — AMOXICILLIN-POT CLAVULANATE 875-125 MG PO TABS: 1.0000 | ORAL_TABLET | Freq: Two times a day (BID) | ORAL | 0 refills | Status: AC

## 2022-04-08 MED ORDER — LIDOCAINE VISCOUS HCL 2 % MT SOLN: 15.0000 mL | OROMUCOSAL | 0 refills | Status: DC | PRN

## 2022-04-08 NOTE — Progress Notes (Signed)
? ?BP 137/80   Pulse 96   Temp 98.4 ?F (36.9 ?C) (Oral)   Ht 5' 0.5" (1.537 m)   Wt 217 lb 6.4 oz (98.6 kg)   LMP 03/17/2013 (Approximate)   SpO2 97%   BMI 41.76 kg/m?   ? ?Subjective:  ? ? Patient ID: Sierra Copeland, female    DOB: 11/26/1956, 66 y.o.   MRN: [id number] ? ?HPI: ?Sierra Copeland is a 66 y.o. female ? ?Chief Complaint  ?Patient presents with  ? Sore Throat  ? Ear Pain  ?  Patient is here stating her ear and throat on the left side are hurting and tender. Patient states she has a history of recurrent ear infections and having fluid behind her ears. Patient states she has tried Advil, Excedrin and Tylenol over the counter. Patient denies having tried any ear drops. Patient states she does get a lot of drainage from that ear and patient states she has discussed with Dr.Johnson at a previous visit and had a lot of fluid behind her ear. Patient says she has constant ringing i  ? ?UPPER RESPIRATORY TRACT INFECTION ?Has had a sore throat for one week, feels most when swallows.  Started out with left ear pain, always is left ear.  Has chronic ear infections, last time she saw PCP she had fluid behind ears. ?Fever:  low grade ?Cough: no ?Shortness of breath: no ?Wheezing: no ?Chest pain: no ?Chest tightness: no ?Chest congestion: no ?Nasal congestion: yes ?Runny nose: no ?Post nasal drip: no ?Sneezing: no ?Sore throat: yes ?Swollen glands: no ?Sinus pressure: yes ?Headache: yes ?Face pain: yes ?Toothache: no ?Ear pain: yes left ?Ear pressure: yes left ?Eyes red/itching:no ?Eye drainage/crusting: no  ?Vomiting: no ?Rash: no ?Fatigue: yes ?Sick contacts: no ?Strep contacts: no  ?Context: fluctuating ?Recurrent sinusitis: no ?Relief with OTC cold/cough medications: Tylenol and Advil ease throat discomfort ?Treatments attempted: as above ? ?Relevant past medical, surgical, family and social history reviewed and updated as indicated. Interim medical history since our last visit reviewed. ?Allergies and  medications reviewed and updated. ? ?Review of Systems  ?Constitutional:  Positive for fatigue and fever. Negative for activity change, appetite change and chills.  ?HENT:  Positive for congestion, ear pain, sinus pressure and sore throat. Negative for ear discharge, facial swelling, postnasal drip, rhinorrhea, sinus pain, sneezing and voice change.   ?Respiratory:  Negative for cough, chest tightness, shortness of breath and wheezing.   ?Cardiovascular:  Negative for chest pain, palpitations and leg swelling.  ?Gastrointestinal: Negative.   ?Neurological: Negative.   ?Psychiatric/Behavioral: Negative.    ? ?Per HPI unless specifically indicated above ? ?   ?Objective:  ?  ?BP 137/80   Pulse 96   Temp 98.4 ?F (36.9 ?C) (Oral)   Ht 5' 0.5" (1.537 m)   Wt 217 lb 6.4 oz (98.6 kg)   LMP 03/17/2013 (Approximate)   SpO2 97%   BMI 41.76 kg/m?   ?Wt Readings from Last 3 Encounters:  ?04/08/22 217 lb 6.4 oz (98.6 kg)  ?02/18/22 216 lb (98 kg)  ?02/11/22 218 lb 9.6 oz (99.2 kg)  ?  ?Physical Exam ?Vitals and nursing note reviewed.  ?Constitutional:   ?   General: She is awake. She is not in acute distress. ?   Appearance: She is well-developed and well-groomed. She is obese. She is not ill-appearing or toxic-appearing.  ?HENT:  ?   Head: Normocephalic. No raccoon eyes.  ?   Right Ear: Hearing, ear canal and  external ear normal. No drainage or tenderness. A middle ear effusion is present.  ?   Left Ear: Hearing, ear canal and external ear normal. No drainage or tenderness. A middle ear effusion is present. Tympanic membrane is injected and bulging.  ?   Ears:  ?   Comments: Able to visualize bony landmarks right ear, cloudy left TM with poor visualization of bony landmarks. ?   Nose: No rhinorrhea.  ?   Right Sinus: No maxillary sinus tenderness or frontal sinus tenderness.  ?   Left Sinus: No maxillary sinus tenderness or frontal sinus tenderness.  ?   Mouth/Throat:  ?   Mouth: Mucous membranes are moist.  ?   Pharynx:  Posterior oropharyngeal erythema (mild with cobblestone appearance) present. No pharyngeal swelling or oropharyngeal exudate.  ?Eyes:  ?   General:     ?   Right eye: No discharge.     ?   Left eye: No discharge.  ?   Conjunctiva/sclera: Conjunctivae normal.  ?   Pupils: Pupils are equal, round, and reactive to light.  ?Neck:  ?   Vascular: No carotid bruit.  ?Cardiovascular:  ?   Rate and Rhythm: Normal rate and regular rhythm.  ?   Heart sounds: Normal heart sounds. No murmur heard. ?  No gallop.  ?Pulmonary:  ?   Effort: Pulmonary effort is normal. No accessory muscle usage or respiratory distress.  ?   Breath sounds: Normal breath sounds.  ?Abdominal:  ?   General: Bowel sounds are normal.  ?   Palpations: Abdomen is soft.  ?Musculoskeletal:  ?   Cervical back: Normal range of motion and neck supple.  ?   Right lower leg: No edema.  ?   Left lower leg: No edema.  ?Lymphadenopathy:  ?   Head:  ?   Right side of head: No submental, submandibular, preauricular or posterior auricular adenopathy.  ?   Left side of head: Preauricular adenopathy present. No submental, submandibular or posterior auricular adenopathy.  ?   Cervical: No cervical adenopathy.  ?Skin: ?   General: Skin is warm and dry.  ?Neurological:  ?   Mental Status: She is alert and oriented to person, place, and time.  ?Psychiatric:     ?   Attention and Perception: Attention normal.     ?   Mood and Affect: Mood normal.     ?   Speech: Speech normal.     ?   Behavior: Behavior normal. Behavior is cooperative.     ?   Thought Content: Thought content normal.  ? ? ?Results for orders placed or performed during the hospital encounter of 02/19/22  ?CBC WITH DIFFERENTIAL  ?Result Value Ref Range  ? WBC 5.5 4.0 - 10.5 K/uL  ? RBC 4.77 3.87 - 5.11 MIL/uL  ? Hemoglobin 13.1 12.0 - 15.0 g/dL  ? HCT 41.4 36.0 - 46.0 %  ? MCV 86.8 80.0 - 100.0 fL  ? MCH 27.5 26.0 - 34.0 pg  ? MCHC 31.6 30.0 - 36.0 g/dL  ? RDW 13.6 11.5 - 15.5 %  ? Platelets 208 150 - 400 K/uL  ?  nRBC 0.0 0.0 - 0.2 %  ? Neutrophils Relative % 60 %  ? Neutro Abs 3.3 1.7 - 7.7 K/uL  ? Lymphocytes Relative 28 %  ? Lymphs Abs 1.6 0.7 - 4.0 K/uL  ? Monocytes Relative 7 %  ? Monocytes Absolute 0.4 0.1 - 1.0 K/uL  ? Eosinophils Relative 4 %  ?  Eosinophils Absolute 0.2 0.0 - 0.5 K/uL  ? Basophils Relative 1 %  ? Basophils Absolute 0.0 0.0 - 0.1 K/uL  ? Immature Granulocytes 0 %  ? Abs Immature Granulocytes 0.01 0.00 - 0.07 K/uL  ?Comprehensive metabolic panel  ?Result Value Ref Range  ? Sodium 136 135 - 145 mmol/L  ? Potassium 4.4 3.5 - 5.1 mmol/L  ? Chloride 98 98 - 111 mmol/L  ? CO2 25 22 - 32 mmol/L  ? Glucose, Bld 203 (H) 70 - 99 mg/dL  ? BUN 14 8 - 23 mg/dL  ? Creatinine, Ser 0.99 0.44 - 1.00 mg/dL  ? Calcium 9.2 8.9 - 10.3 mg/dL  ? Total Protein 6.9 6.5 - 8.1 g/dL  ? Albumin 3.4 (L) 3.5 - 5.0 g/dL  ? AST 28 15 - 41 U/L  ? ALT 26 0 - 44 U/L  ? Alkaline Phosphatase 33 (L) 38 - 126 U/L  ? Total Bilirubin 0.7 0.3 - 1.2 mg/dL  ? GFR, Estimated >60 >60 mL/min  ? Anion gap 13 5 - 15  ? ?   ?Assessment & Plan:  ? ?Problem List Items Addressed This Visit   ? ?  ? Nervous and Auditory  ? Non-recurrent acute serous otitis media of left ear - Primary  ?  Acute for one week with left ear pain, no recent treatment for such -- last many months ago.  At this time since TM intact will treat with Augmentin BID and viscous lidocaine sent for sore throat (this does not appear strep like, more drainage in appearance).  Recommend: ?- Increased rest ?- Increasing Fluids ?- Acetaminophen as needed for fever/pain.  ?- Salt water gargling, chloraseptic spray and throat lozenges ?- Humidifying the air. ?Return to office as scheduled for Dr. Wynetta Emery for follow-up. ?  ?  ? Relevant Medications  ? amoxicillin-clavulanate (AUGMENTIN) 875-125 MG tablet  ?  ? ?Follow up plan: ?Return for as scheduled 04/21/22. ? ? ? ? ? ?

## 2022-04-08 NOTE — Patient Instructions (Signed)
Otitis Media, Adult  Otitis media is a condition in which the middle ear is red and swollen (inflamed) and full of fluid. The middle ear is the part of the ear that contains bones for hearing as well as air that helps send sounds to the brain. The condition usually goes away on its own. What are the causes? This condition is caused by a blockage in the eustachian tube. This tube connects the middle ear to the back of the nose. It normally allows air into the middle ear. The blockage is caused by fluid or swelling. Problems that can cause blockage include: A cold or infection that affects the nose, mouth, or throat. Allergies. An irritant, such as tobacco smoke. Adenoids that have become large. The adenoids are soft tissue located in the back of the throat, behind the nose and the roof of the mouth. Growth or swelling in the upper part of the throat, just behind the nose (nasopharynx). Damage to the ear caused by a change in pressure. This is called barotrauma. What increases the risk? You are more likely to develop this condition if you: Smoke or are exposed to tobacco smoke. Have an opening in the roof of your mouth (cleft palate). Have acid reflux. Have problems in your body's defense system (immune system). What are the signs or symptoms? Symptoms of this condition include: Ear pain. Fever. Problems with hearing. Being tired. Fluid leaking from the ear. Ringing in the ear. How is this treated? This condition can go away on its own within 3-5 days. But if the condition is caused by germs (bacteria) and does not go away on its own, or if it keeps coming back, your doctor may: Give you antibiotic medicines. Give you medicines for pain. Follow these instructions at home: Take over-the-counter and prescription medicines only as told by your doctor. If you were prescribed an antibiotic medicine, take it as told by your doctor. Do not stop taking it even if you start to feel better. Keep  all follow-up visits. Contact a doctor if: You have bleeding from your nose. There is a lump on your neck. You are not feeling better in 5 days. You feel worse instead of better. Get help right away if: You have pain that is not helped with medicine. You have swelling, redness, or pain around your ear. You get a stiff neck. You cannot move part of your face (paralysis). You notice that the bone behind your ear hurts when you touch it. You get a very bad headache. Summary Otitis media means that the middle ear is red, swollen, and full of fluid. This condition usually goes away on its own. If the problem does not go away, treatment may be needed. You may be given medicines to treat the infection or to treat your pain. If you were prescribed an antibiotic medicine, take it as told by your doctor. Do not stop taking it even if you start to feel better. Keep all follow-up visits. This information is not intended to replace advice given to you by your health care provider. Make sure you discuss any questions you have with your health care provider. Document Revised: 02/23/2021 Document Reviewed: 02/23/2021 Elsevier Patient Education  2023 [employer]vier Inc.  

## 2022-04-08 NOTE — Assessment & Plan Note (Signed)
Acute for one week with left ear pain, no recent treatment for such -- last many months ago.  At this time since TM intact will treat with Augmentin BID and viscous lidocaine sent for sore throat (this does not appear strep like, more drainage in appearance).  Recommend: ?- Increased rest ?- Increasing Fluids ?- Acetaminophen as needed for fever/pain.  ?- Salt water gargling, chloraseptic spray and throat lozenges ?- Humidifying the air. ?Return to office as scheduled for Dr. Wynetta Emery for follow-up. ?

## 2022-04-13 ENCOUNTER — Inpatient Hospital Stay: Payer: Disability Insurance | Admitting: Licensed Clinical Social Worker

## 2022-04-13 DIAGNOSIS — Z1379 Encounter for other screening for genetic and chromosomal anomalies: Secondary | ICD-10-CM | POA: Insufficient documentation

## 2022-04-13 DIAGNOSIS — Z1509 Genetic susceptibility to other malignant neoplasm: Secondary | ICD-10-CM | POA: Insufficient documentation

## 2022-04-13 NOTE — Progress Notes (Deleted)
Genetic Test Results  HPI:  Ms. Trettin was previously seen in the [facility] due to a personal and family history of breast cancer and concerns regarding a hereditary predisposition to cancer. Please refer to our prior cancer genetics clinic note for more information regarding our discussion, assessment and recommendations, at the time. Ms. Wands recent genetic test results were disclosed to her, as were recommendations warranted by these results. These results and recommendations are discussed in more detail below.  CANCER HISTORY:  Oncology History  Invasive ductal carcinoma of breast, female, left ([place])  01/31/2018 Initial Diagnosis   Invasive ductal carcinoma of breast, female, left ([place])    02/02/2022 Cancer Staging   Staging form: Breast, AJCC 8th Edition - Pathologic stage from 02/02/2022: Stage IB (pT1c, pN0, cM0, G3, ER-, PR-, HER2-) - Signed by Lloyd Huger, MD on 02/02/2022 Stage prefix: Initial diagnosis Histologic grading system: 3 grade system      FAMILY HISTORY:  We obtained a detailed, 4-generation family history.  Significant diagnoses are listed below: Family History  Problem Relation Age of Onset   Liver cancer Mother 15       gallbaldder   Hypertension Mother    Hyperlipidemia Mother    Hyperlipidemia Father    Hypertension Father    Diabetes Maternal Aunt    Diabetes Maternal Uncle    Heart disease Maternal Uncle    Breast cancer Paternal Aunt    Diabetes Paternal Aunt    Breast cancer Paternal Aunt    Diabetes Paternal Uncle    Dementia Maternal Grandmother    Stroke Maternal Grandfather    Diabetes Paternal Grandmother    Heart disease Paternal Grandfather    Other Cousin 64       BRCA or similar mutation positive    Ms. Cristo does not have children or siblings.   Ms. Mccard mother had gallbladder cancer at 29 and died at 16. Patient had 1 maternal aunt, 1 uncle, no cancer. Grandparents passed over age 34.   Ms.  Lowdermilk father died at 53. Patient had 14 paternal aunts/uncles. Two aunts had breast cancer, 1 aunt had unknown type cancer. A cousin also had breast cancer. Paternal grandmother passed at 26 and grandfather passed over age 20.   Ms. Mastropietro is unaware of previous family history of genetic testing for hereditary cancer risks. Patient's maternal ancestors are of unknown descent, and paternal ancestors are of unknown descent. There is no reported Ashkenazi Jewish ancestry. There is no known consanguinity.     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 The remainder of testing was negative/normal.   The CancerNext-Expanded + RNAinsight gene panel offered by [employer] and includes sequencing and rearrangement analysis for the following 77 genes: IP, ALK, APC*, ATM*, AXIN2, BAP1, BARD1, BLM, BMPR1A, BRCA1*, BRCA2*, BRIP1*, CDC73, CDH1*,CDK4, CDKN1B, CDKN2A, CHEK2*, CTNNA1, DICER1, FANCC, FH, FLCN, GALNT12, KIF1B, LZTR1, MAX, MEN1, MET, MLH1*, MSH2*, MSH3, MSH6*, MUTYH*, NBN, NF1*, NF2, NTHL1, PALB2*, PHOX2B, PMS2*, POT1, PRKAR1A, PTCH1, PTEN*, RAD51C*, RAD51D*,RB1, RECQL, RET, SDHA, SDHAF2, SDHB, SDHC, SDHD, SMAD4, SMARCA4, SMARCB1, SMARCE1, STK11, SUFU, TMEM127, TP53*,TSC1, TSC2, VHL and XRCC2 (sequencing and deletion/duplication); EGFR, EGLN1, HOXB13, KIT, MITF, PDGFRA, POLD1 and POLE (sequencing only); EPCAM and GREM1 (deletion/duplication only).  The test report has been scanned into EPIC and is located under the Molecular Pathology section of the Results Review tab.  A portion of the  result report is included below for reference.     Genetic testing identified a variant of uncertain significance (VUS) in the MSH6 gene called c.188C>G.  At this time, it is unknown if this variant is associated with an increased risk for cancer or if it is benign, but most uncertain  variants are reclassified to benign. It should not be used to make medical management decisions. With time, we suspect the laboratory will determine the significance of this variant, if any. If the laboratory reclassifies this variant, we will attempt to contact _0 @ _1 @ to discuss it further.   DISCUSSION: PMS2 (Lynch syndrome)   We discussed the cancers, inheritance, management asscociated with PMS2 and the importance of telling family members about this result. This result does not explain Ms. Passero's breast cancer or the family history of breast cancer.    Clinical condition Lynch syndrome is characterized by an increased risk of colorectal cancer as well as cancers of the endometrium, ovary, prostate, stomach, small intestine, hepatobiliary tract, urinary tract, pancreas and brain (PMID: 7048889, 16945038, 88280034). Lynch syndrome tumors typically demonstrate microsatellite instability (MSI) as well as loss of expression of the mismatch repair proteins on immunohistochemical (IHC) staining. This condition is caused by pathogenic variants in one of the mismatch repair genes--MLH1, MSH2, MSH6, PMS2--as well as deletions in the 3' end of the EPCAM gene.  Gene information MLH1, MSH2, MSH6 and PMS2 are mismatch repair (MMR) genes involved in correcting mistakes incurred during DNA replication. The MSH2 and MSH6 proteins join to form a heterodimer that recognizes replication errors and initiates DNA repair. The MLH1 and PMS2 proteins also join to form a heterodimer that binds to the MSH2/MSH6 heterodimer to form a four unit complex that coordinates the activities of the other proteins involved in DNA repair. Pathogenic variants in one of the MMR genes may prevent normal mismatch repair, leading to the accumulation of errors in other genes increasing the risk of developing certain cancer types (PMID: 91791505, UniProtKB - [id number] [facility]), [phone] [facility]), Z48270 (MSH6_HUMAN), [phone]  (PMS2_HUMAN). [email or web address]. Accessed February 2017, [facility] Reference. MLH1, MSH2, MSH6, PMS2. [email or web address]. Accessed February 2017).  Inheritance Lynch syndrome has autosomal dominant inheritance. This means that an individual with a pathogenic variant has a 50% chance of passing the condition on to their offspring. Once a pathogenic mutation is detected in an individual, it is possible to identify at-risk relatives who can pursue testing for this specific familial variant. Many cases are inherited from a parent, but some can occur spontaneously (i.e., an individual with a pathogenic variant has parents who do not have it); however, that individual now has a 50% risk of passing it on to future offspring.  Additionally, individuals with a pathogenic variant in one of the MMR genes (MLH1, MSH2, MSH6, PMS2) are carriers of constitutional mismatch repair deficiency (CMMR-D). CMMR-D is a childhood-onset cancer predisposition syndrome that can present with hematological malignancies, cancers of the brain and central nervous system, Lynch syndrome-associated cancers (colon, uterine, small bowel, urinary tract), embryonic tumors, and sarcomas. Some affected individuals may also display some features of neurofibromatosis type 1--most often cafe-au-lait macules (PMID: 49201007, 12197588). For there to be a risk of CMMR-D in offspring, an individual and their partner would each have to have a single pathogenic variant in the same MMR gene; in such a case, the risk of having an affected child is 25%.  Management:     Colorectal cancer -High-quality colonoscopy at 30-35 or 2-5 years prior to earliest colon cancer if  it is diagnosed before age 42 and repeat every 1-3 years -There are data demonstrating that use of 600 mg/daily of aspirin for at least 2 years decreases CRC risk in LS, decision to use aspirin should be individualized  Endometrial cancer -PMS2 carriers appear  to be at only modestly increased risk of endometrial cancer compared to other Lynch syndrome genes -Women should be educated on prompt reporting and evaluation of abnormal uterine bleeding/postmenopausal bleeding, evaluation should include endometrial biopsy -Hysterectomy has not been shown to reduce endometrial cancer mortality, but can reduce incidence of endometrial cancer. Therefore hysterectomy can be considered, timing can be individualized. -Endometrial cancer screening does not have proven benefit in women with LS, however endometrial biopsy is both highly sensitive and specific, screening via endometrial biopsy can be considered every 1-2 years at age 67-35 can be considered. -Transvaginal ultrasound to screen for endometrial cancer in postmenopausal women has not been shown to be sufficiently sensitive or specific as to support a positive recommendation, but may be considered at clinician's discretion.   Ovarian cancer -Insufficient evidence exists to make a recommendation for RRSO for PMS2 pathogenic variant carriers -PMS2 carriers appear to be at no greater than average risk for ovarian cancer -Bilateral salpingo-oophorectomy may reduce incidence of ovarian cancer. The decision to have BSO and timing should be individualized -Since there is no effective screening for ovarian cancer, women should be educated on symptoms that may be associated such as pelvic or abdominal pain, bloating, increased abdominal girth, difficulty eating, early satiety, or urinary frequency or urgency. Symptoms that persist for several weeks and are a change from woman's baseline should prompt evaluation by physician.  -Data do not support routine ovarian cancer screening for LS. Transvaginal ultrasound may be considered, serum CA-125 may be considered at clinician's discretion -Consider risk-reducing agents for endometrial and ovarian cancers  Urothelial cancer (Renal pelvis, ureter and/or bladder) -No clear  evidence to support surveillance for urothelial cancers in LS -Surveillance may be considered in selected individuals such as family history of urothelial cancer, individuals with MSH2 mutations appear to be at higher risk -Surveillance options may include urinalysis starting at 30-35  Gastric and small bowel cancer -Consider upper GI surveillance with EGD starting at 30-40 years and repeat every 2-4 years  Pancreatic cancer -PMS2 carriers have not been shown to be at increased risk for pancreatic tumors  Prostate cancer -Men with LS should consider their risk based on LS gene and family history of prostate cancer -It is reasonable for men with LS to consider shared decision-making about prostate cancer screening at age 80 years and consider screening at annual intervals rather than every other year  Brain cancer -Patients should be educated regarding signs and symptoms or neurologic cancer and the importance of prompt reporting of abnormal symptoms  Skin manifestations -Frequency of malignant and benign skin tumors such as sebaceous adenocarcinomas, sebaceous adenomas and keratocanthomas has been reported to be increased among patients with LS, but cumulative lifetime risk and median age of presentation are uncertain. Further, an elevated risk of sebaceous tumors and keratocanthoma has not been documented for PMS2 carriers -Consider skin exam every 1-2 years with a health care provider skilled in identifying LS-associated skin manifestations  These guidelines are based on current NCCN guidelines (NCCN v.2.2022).  These guidelines are subject to change and continually updated and should be directly referenced for future medical management.     An individual's cancer risk and medical management are not determined by genetic test results alone. Overall  cancer risk assessment incorporates additional factors, including personal medical history, family history, and any available genetic information  that may result in a personalized plan for cancer prevention and surveillance.  Knowing if a pathogenic PMS2 variant is present is advantageous. At-risk relatives can be identified, enabling pursuit of a diagnostic evaluation. Information regarding hereditary cancer susceptibility genes is constantly evolving, and more clinically relevant data regarding ATM is likely to become available in the near future. Awareness of this cancer predisposition encourages patients and their providers to inform at-risk family members, to diligently follow condition-specific screening protocols, and to be vigilant in maintaining close and regular contact with their local genetics clinic in anticipation of new information.   FAMILY MEMBERS: It is important that all of Ms. Blais's relatives (both men and women) know of the presence of this gene mutation. Site-specific genetic testing can sort out who in the family is at risk and who is not.   PLAN:   Ms. Laroche will need to be followed as high risk based on her diagnosis of Lynch syndrome.    [PATIENT HAS A GI MD] Ms. Mullan has identified a gastroenterologist that she plans to see for follow up for her diagnosis of Lynch syndrome.  This individual is Dr. Alto Denver DOES NOT HAVE A GI MD] Ms. Truluck does not have a gastroenterologist to perform follow up for the diagnosis of Lynch syndrome.  We have suggested that she be seen at [facility].***  This note will be copied to that practice in order to set up the appropriate follow up.  [PATIENT HAS A GYN MD]  Ms. Goates has identified a gynecologist that she plans to see for follow up for her diagnosis of Lynch syndrome.  This individual is Dr. Alto Denver DOES NOT HAVE A GYN MD] Ms. Porchia does not have a gynecologist to perform follow up for the diagnosis of Lynch syndrome.  We have suggested that she go back to her referring physician to discuss hysterectomy and referral options.  This note will be copied to  Ms. Rummell's referring physician.   Ms. Vanblarcom plans to discuss these results with family and will reach out to [place] if we can be of any assistance in coordinating genetic testing for any of relatives.    SUPPORT AND RESOURCES: Ms. Desaulniers was given patient information about Lynch syndrome that was written by the national support group "it takes guts".  she was also provided information about the cancer registry program, Vivia Ewing, out of [employer].  The cancer registry provides a way for researchers and patients to link together.  [employer] also provides a biannual newsletter that provides updated information about hereditary cancer genes and syndromes. Ms. Skorupski was also given information about [employer], a lynch syndrome hereditary cancer advocacy organization: CarBirth.com.cy  We encouraged Ms. Basford to remain in contact with [place] on an annual basis so we can update her personal and family histories, and let her know of advances in cancer genetics that may benefit the family. Our contact number was provided. Ms. Ezzell questions were answered to her satisfaction today, and she knows she is welcome to call anytime with additional questions.   Faith Rogue, MS, [facility] Genetic Counselor [email or web address] Phone: [phone]

## 2022-04-21 ENCOUNTER — Ambulatory Visit (INDEPENDENT_AMBULATORY_CARE_PROVIDER_SITE_OTHER): Payer: Medicare Other | Admitting: Family Medicine

## 2022-04-21 ENCOUNTER — Encounter: Payer: Self-pay | Admitting: Family Medicine

## 2022-04-21 VITALS — BP 157/83 | HR 71 | Temp 98.5°F | Wt 216.8 lb

## 2022-04-21 DIAGNOSIS — Z794 Long term (current) use of insulin: Secondary | ICD-10-CM

## 2022-04-21 DIAGNOSIS — E1165 Type 2 diabetes mellitus with hyperglycemia: Secondary | ICD-10-CM

## 2022-04-21 LAB — BAYER DCA HB A1C WAIVED: HB A1C (BAYER DCA - WAIVED): 8.5 % — ABNORMAL HIGH (ref 4.8–5.6)

## 2022-04-21 MED ORDER — GVOKE HYPOPEN 2-PACK 1 MG/0.2ML ~~LOC~~ SOAJ: 1.0000 | SUBCUTANEOUS | 12 refills | Status: DC | PRN

## 2022-04-21 NOTE — Progress Notes (Signed)
BP (!) 157/83   Pulse 71   Temp 98.5 F (36.9 C)   Wt 216 lb 12.8 oz (98.3 kg)   LMP 03/17/2013 (Approximate)   SpO2 99%   BMI 41.64 kg/m    Subjective:    Patient ID: Sierra Copeland, female    DOB: 1956/06/01, 66 y.o.   MRN: [id number]  HPI: Sierra Copeland is a 66 y.o. female  Chief Complaint  Patient presents with   Diabetes   Ear Pain    Patient was seen earlier this month for ear pain, patient states her ears still hurt a little bit.    DIABETES Hypoglycemic episodes:yes Polydipsia/polyuria: no Visual disturbance: no Chest pain: no Paresthesias: yes Glucose Monitoring: yes- continuous glucose Taking Insulin?: yes Blood Pressure Monitoring: not checking Retinal Examination: Up to Date Foot Exam: Up to Date Diabetic Education: Completed Pneumovax: Up to Date Influenza: Up to Date Aspirin: no  Relevant past medical, surgical, family and social history reviewed and updated as indicated. Interim medical history since our last visit reviewed. Allergies and medications reviewed and updated.  Review of Systems  Constitutional: Negative.   Respiratory: Negative.    Cardiovascular: Negative.   Gastrointestinal: Negative.   Musculoskeletal: Negative.   Neurological: Negative.   Psychiatric/Behavioral: Negative.     Per HPI unless specifically indicated above     Objective:    BP (!) 157/83   Pulse 71   Temp 98.5 F (36.9 C)   Wt 216 lb 12.8 oz (98.3 kg)   LMP 03/17/2013 (Approximate)   SpO2 99%   BMI 41.64 kg/m   Wt Readings from Last 3 Encounters:  04/21/22 216 lb 12.8 oz (98.3 kg)  04/08/22 217 lb 6.4 oz (98.6 kg)  02/18/22 216 lb (98 kg)    Physical Exam Vitals and nursing note reviewed.  Constitutional:      General: She is not in acute distress.    Appearance: Normal appearance. She is obese. She is not ill-appearing, toxic-appearing or diaphoretic.  HENT:     Head: Normocephalic and atraumatic.     Right Ear: External ear normal.     Left  Ear: External ear normal.     Nose: Nose normal.     Mouth/Throat:     Mouth: Mucous membranes are moist.     Pharynx: Oropharynx is clear.  Eyes:     General: No scleral icterus.       Right eye: No discharge.        Left eye: No discharge.     Extraocular Movements: Extraocular movements intact.     Conjunctiva/sclera: Conjunctivae normal.     Pupils: Pupils are equal, round, and reactive to light.  Cardiovascular:     Rate and Rhythm: Normal rate and regular rhythm.     Pulses: Normal pulses.     Heart sounds: Normal heart sounds. No murmur heard.   No friction rub. No gallop.  Pulmonary:     Effort: Pulmonary effort is normal. No respiratory distress.     Breath sounds: Normal breath sounds. No stridor. No wheezing, rhonchi or rales.  Chest:     Chest wall: No tenderness.  Musculoskeletal:        General: Normal range of motion.     Cervical back: Normal range of motion and neck supple.  Skin:    General: Skin is warm and dry.     Capillary Refill: Capillary refill takes less than 2 seconds.     Coloration: Skin is  not jaundiced or pale.     Findings: No bruising, erythema, lesion or rash.  Neurological:     General: No focal deficit present.     Mental Status: She is alert and oriented to person, place, and time. Mental status is at baseline.  Psychiatric:        Mood and Affect: Mood normal.        Behavior: Behavior normal.        Thought Content: Thought content normal.        Judgment: Judgment normal.    Results for orders placed or performed in visit on 04/21/22  Bayer DCA Hb A1c Waived  Result Value Ref Range   HB A1C (BAYER DCA - WAIVED) 8.5 (H) 4.8 - 5.6 %      Assessment & Plan:   Problem List Items Addressed This Visit       Endocrine   Type 2 diabetes mellitus with hyperglycemia ([place]) - Primary    Stable with A1c of 8.5. Does not want to change or add medicine. Will continue current regimen and recheck 3 months. If not getting better, will need to  add medicine or change trulicity to [facility]. Follow up 3 months.        Relevant Medications   Glucagon (GVOKE HYPOPEN 2-PACK) 1 MG/0.2ML SOAJ   Other Relevant Orders   Bayer DCA Hb A1c Waived (Completed)     Follow up plan: Return in about 3 months (around 07/22/2022).

## 2022-04-21 NOTE — Assessment & Plan Note (Signed)
Stable with A1c of 8.5. Does not want to change or add medicine. Will continue current regimen and recheck 3 months. If not getting better, will need to add medicine or change trulicity to [facility]. Follow up 3 months.

## 2022-04-27 ENCOUNTER — Telehealth: Payer: Self-pay | Admitting: Licensed Clinical Social Worker

## 2022-04-27 NOTE — Telephone Encounter (Signed)
Patient called to cancel her appointment with me tomorrow, 5/31, as her husband is hospitalized and she would like him to be able to attend the PMS2 results session. She will call back once it is a more convenient time for them to come in.

## 2022-04-28 ENCOUNTER — Inpatient Hospital Stay: Payer: Medicare Other | Admitting: Licensed Clinical Social Worker

## 2022-06-12 ENCOUNTER — Other Ambulatory Visit: Payer: Self-pay | Admitting: Family Medicine

## 2022-06-14 NOTE — Telephone Encounter (Signed)
Requested Prescriptions  Pending Prescriptions Disp Refills  . Continuous Blood Gluc Sensor (FREESTYLE LIBRE 14 DAY SENSOR) MISC [Pharmacy Med Name: FREESTYLE LIBRE 14DAY SENSOR] 12 each 3    Sig: CHANGE EVERY 32 DAYS     Endocrinology: Diabetes - Testing Supplies Passed - 06/12/2022  3:13 AM      Passed - Valid encounter within last 12 months    Recent Outpatient Visits          1 month ago Type 2 diabetes mellitus with hyperglycemia, with long-term current use of insulin ([place])   [place], Megan P, DO   2 months ago Non-recurrent acute serous otitis media of left ear   [place], Henrine Screws T, NP   4 months ago Routine general medical examination at a health care facility   [facility], [place] P, DO   9 months ago Type 2 diabetes mellitus with hyperglycemia, with long-term current use of insulin [facility])   [place], Megan P, DO   1 year ago Type 2 diabetes mellitus with hyperglycemia, with long-term current use of insulin [facility])   [place], [place], DO      Future Appointments            In 1 month Johnson, Barb Merino, DO [employer], [facility]

## 2022-06-28 ENCOUNTER — Other Ambulatory Visit: Payer: Self-pay | Admitting: Family Medicine

## 2022-06-28 DIAGNOSIS — Z636 Dependent relative needing care at home: Secondary | ICD-10-CM | POA: Insufficient documentation

## 2022-06-29 ENCOUNTER — Telehealth: Payer: Self-pay

## 2022-06-29 NOTE — Chronic Care Management (AMB) (Signed)
  Chronic Care Management   Note  06/29/2022 Name: Sierra Copeland MRN: [id number] DOB: Apr 17, 1956  Sierra Copeland is a 66 y.o. year old female who is a primary care patient of Valerie Roys, DO. I reached out to [name] by phone today in response to a referral sent by Sierra Copeland PCP.  Sierra Copeland was given information about Chronic Care Management services today including:  CCM service includes personalized support from designated clinical staff supervised by her physician, including individualized plan of care and coordination with other care providers 24/7 contact phone numbers for assistance for urgent and routine care needs. Service will only be billed when office clinical staff spend 20 minutes or more in a month to coordinate care. Only one practitioner may furnish and bill the service in a calendar month. The patient may stop CCM services at any time (effective at the end of the month) by phone call to the office staff. The patient is responsible for co-pay (up to 20% after annual deductible is met) if co-pay is required by the individual health plan.   Patient did not agree to enrollment in care management services and does not wish to consider at this time.  Follow up plan: Patient declines further follow up and engagement by the care management team. Appropriate care team members and provider have been notified via electronic communication.   Noreene Larsson, [place], [place] [address] Direct Dial: [phone] [email or web address]

## 2022-07-05 ENCOUNTER — Other Ambulatory Visit: Payer: Self-pay | Admitting: Family Medicine

## 2022-07-05 ENCOUNTER — Encounter: Payer: Self-pay | Admitting: Family Medicine

## 2022-07-06 ENCOUNTER — Ambulatory Visit
Admission: RE | Admit: 2022-07-06 | Discharge: 2022-07-06 | Disposition: A | Discharge status: Home / Self Care | Payer: Medicare Other | Source: Ambulatory Visit | Attending: Oncology | Admitting: Oncology

## 2022-07-06 DIAGNOSIS — Z1231 Encounter for screening mammogram for malignant neoplasm of breast: Secondary | ICD-10-CM | POA: Insufficient documentation

## 2022-07-06 DIAGNOSIS — Z853 Personal history of malignant neoplasm of breast: Secondary | ICD-10-CM | POA: Diagnosis not present

## 2022-07-06 DIAGNOSIS — C50912 Malignant neoplasm of unspecified site of left female breast: Secondary | ICD-10-CM

## 2022-07-06 NOTE — Telephone Encounter (Signed)
Requested Prescriptions  Pending Prescriptions Disp Refills  . escitalopram (LEXAPRO) 20 MG tablet [Pharmacy Med Name: ESCITALOPRAM OXALATE 20 MG TAB] 90 tablet 0    Sig: TAKE 1 TABLET BY MOUTH ONCE DAILY     Psychiatry:  Antidepressants - SSRI Passed - 07/05/2022  1:28 PM      Passed - Completed PHQ-2 or PHQ-9 in the last 360 days      Passed - Valid encounter within last 6 months    Recent Outpatient Visits          2 months ago Type 2 diabetes mellitus with hyperglycemia, with long-term current use of insulin ([place])   [place], Megan P, DO   2 months ago Non-recurrent acute serous otitis media of left ear   [place], Henrine Screws T, NP   5 months ago Routine general medical examination at a health care facility   [facility], [place] P, DO   10 months ago Type 2 diabetes mellitus with hyperglycemia, with long-term current use of insulin [facility])   [place], Megan P, DO   1 year ago Type 2 diabetes mellitus with hyperglycemia, with long-term current use of insulin [facility])   [place], [place], DO      Future Appointments            In 2 weeks [place] Emer[place], Barb Merino, DO [facility], [facility]

## 2022-07-07 ENCOUNTER — Inpatient Hospital Stay
Admission: RE | Admit: 2022-07-07 | Discharge: 2022-07-07 | Disposition: A | Discharge status: Home / Self Care | Payer: Self-pay | Source: Ambulatory Visit | Attending: *Deleted | Admitting: *Deleted

## 2022-07-07 ENCOUNTER — Other Ambulatory Visit: Payer: Self-pay | Admitting: *Deleted

## 2022-07-07 DIAGNOSIS — Z1231 Encounter for screening mammogram for malignant neoplasm of breast: Secondary | ICD-10-CM

## 2022-07-22 ENCOUNTER — Ambulatory Visit: Payer: Medicare Other | Admitting: Family Medicine

## 2022-07-26 ENCOUNTER — Ambulatory Visit (INDEPENDENT_AMBULATORY_CARE_PROVIDER_SITE_OTHER): Payer: Medicare Other | Admitting: Family Medicine

## 2022-07-26 ENCOUNTER — Encounter: Payer: Self-pay | Admitting: Family Medicine

## 2022-07-26 VITALS — BP 145/72 | HR 82 | Temp 98.4°F | Wt 205.0 lb

## 2022-07-26 DIAGNOSIS — E7800 Pure hypercholesterolemia, unspecified: Secondary | ICD-10-CM | POA: Diagnosis not present

## 2022-07-26 DIAGNOSIS — E1169 Type 2 diabetes mellitus with other specified complication: Secondary | ICD-10-CM | POA: Diagnosis not present

## 2022-07-26 DIAGNOSIS — I129 Hypertensive chronic kidney disease with stage 1 through stage 4 chronic kidney disease, or unspecified chronic kidney disease: Secondary | ICD-10-CM

## 2022-07-26 DIAGNOSIS — H6982 Other specified disorders of Eustachian tube, left ear: Secondary | ICD-10-CM

## 2022-07-26 DIAGNOSIS — E669 Obesity, unspecified: Secondary | ICD-10-CM

## 2022-07-26 LAB — BAYER DCA HB A1C WAIVED: HB A1C (BAYER DCA - WAIVED): 8 % — ABNORMAL HIGH (ref 4.8–5.6)

## 2022-07-26 MED ORDER — AMLODIPINE BESYLATE 2.5 MG PO TABS: 2.5000 mg | ORAL_TABLET | Freq: Every day | ORAL | 1 refills | Status: DC

## 2022-07-26 MED ORDER — ESCITALOPRAM OXALATE 20 MG PO TABS: 20.0000 mg | ORAL_TABLET | Freq: Every day | ORAL | 1 refills | Status: DC

## 2022-07-26 MED ORDER — TOUJEO SOLOSTAR 300 UNIT/ML ~~LOC~~ SOPN
PEN_INJECTOR | SUBCUTANEOUS | 12 refills | Status: DC
Start: 2022-07-26 — End: 2023-01-24

## 2022-07-26 MED ORDER — LISINOPRIL 20 MG PO TABS: 20.0000 mg | ORAL_TABLET | Freq: Every day | ORAL | 1 refills | Status: DC

## 2022-07-26 MED ORDER — METFORMIN HCL 1000 MG PO TABS: 1000.0000 mg | ORAL_TABLET | Freq: Two times a day (BID) | ORAL | 1 refills | Status: DC

## 2022-07-26 MED ORDER — OMEPRAZOLE 40 MG PO CPDR: 40.0000 mg | DELAYED_RELEASE_CAPSULE | Freq: Every day | ORAL | 1 refills | Status: DC

## 2022-07-26 MED ORDER — TRULICITY 1.5 MG/0.5ML ~~LOC~~ SOAJ: 1.5000 mg | SUBCUTANEOUS | 1 refills | Status: DC

## 2022-07-26 MED ORDER — SIMVASTATIN 40 MG PO TABS: 40.0000 mg | ORAL_TABLET | Freq: Every day | ORAL | 1 refills | Status: DC

## 2022-07-26 MED ORDER — HYOSCYAMINE SULFATE SL 0.125 MG SL SUBL
0.1250 mg | SUBLINGUAL_TABLET | Freq: Every day | SUBLINGUAL | 3 refills | Status: AC | PRN
Start: 2022-07-26 — End: ?

## 2022-07-26 MED ORDER — GABAPENTIN 100 MG PO CAPS: 200.0000 mg | ORAL_CAPSULE | Freq: Two times a day (BID) | ORAL | 1 refills | Status: DC

## 2022-07-26 MED ORDER — TRIAMCINOLONE ACETONIDE 40 MG/ML IJ SUSP: 40.0000 mg | Freq: Once | INTRAMUSCULAR | Status: AC

## 2022-07-26 NOTE — Progress Notes (Unsigned)
BP (!) 145/72   Pulse 82   Temp 98.4 F (36.9 C)   Wt 205 lb (93 kg)   LMP 03/17/2013 (Approximate)   SpO2 98%   BMI 39.38 kg/m    Subjective:    Patient ID: Sierra Copeland, female    DOB: 02/09/1956, 66 y.o.   MRN: [id number]  HPI: Sierra Copeland is a 66 y.o. female  Chief Complaint  Patient presents with  . Diabetes  . Ear Pain    Patient states her left ear has been hurting, pain comes and goes.    DIABETES Hypoglycemic episodes:{Blank single:19197::"yes","no"} Polydipsia/polyuria: {Blank single:19197::"yes","no"} Visual disturbance: {Blank single:19197::"yes","no"} Chest pain: {Blank single:19197::"yes","no"} Paresthesias: {Blank single:19197::"yes","no"} Glucose Monitoring: {Blank single:19197::"yes","no"}  Accucheck frequency: {Blank single:19197::"Not Checking","Daily","BID","TID"}  Fasting glucose:  Post prandial:  Evening:  Before meals: Taking Insulin?: {Blank single:19197::"yes","no"}  Long acting insulin:  Short acting insulin: Blood Pressure Monitoring: {Blank single:19197::"not checking","rarely","daily","weekly","monthly","a few times a day","a few times a week","a few times a month"} Retinal Examination: {Blank single:19197::"Up to Date","Not up to Date"} Foot Exam: {Blank single:19197::"Up to Date","Not up to Date"} Diabetic Education: {Blank single:19197::"Completed","Not Completed"} Pneumovax: {Blank single:19197::"Up to Date","Not up to Date","unknown"} Influenza: {Blank single:19197::"Up to Date","Not up to Date","unknown"} Aspirin: {Blank single:19197::"yes","no"}  HYPERTENSION / HYPERLIPIDEMIA Satisfied with current treatment? {Blank single:19197::"yes","no"} Duration of hypertension: {Blank single:19197::"chronic","months","years"} BP monitoring frequency: {Blank single:19197::"not checking","rarely","daily","weekly","monthly","a few times a day","a few times a week","a few times a month"} BP range:  BP medication side effects: {Blank  single:19197::"yes","no"} Past BP meds: {Blank XAJOINOM:76720::"NOBS","JGGEZMOQHU","TMLYYTKPTW/SFKCLEXNTZ","GYFVCBSW","HQPRFFMBWG","YKZLDJTTSV/XBLT","JQZESPQZRA (bystolic)","carvedilol","chlorthalidone","clonidine","diltiazem","exforge HCT","HCTZ","irbesartan (avapro)","labetalol","lisinopril","lisinopril-HCTZ","losartan (cozaar)","methyldopa","nifedipine","olmesartan (benicar)","olmesartan-HCTZ","quinapril","ramipril","spironalactone","tekturna","valsartan","valsartan-HCTZ","verapamil"} Duration of hyperlipidemia: {Blank single:19197::"chronic","months","years"} Cholesterol medication side effects: {Blank single:19197::"yes","no"} Cholesterol supplements: {Blank multiple:19196::"none","fish oil","niacin","red yeast rice"} Past cholesterol medications: {Blank multiple:19196::"none","atorvastain (lipitor)","lovastatin (mevacor)","pravastatin (pravachol)","rosuvastatin (crestor)","simvastatin (zocor)","vytorin","fenofibrate (tricor)","gemfibrozil","ezetimide (zetia)","niaspan","lovaza"} Medication compliance: {Blank single:19197::"excellent compliance","good compliance","fair compliance","poor compliance"} Aspirin: {Blank single:19197::"yes","no"} Recent stressors: {Blank single:19197::"yes","no"} Recurrent headaches: {Blank single:19197::"yes","no"} Visual changes: {Blank single:19197::"yes","no"} Palpitations: {Blank single:19197::"yes","no"} Dyspnea: {Blank single:19197::"yes","no"} Chest pain: {Blank single:19197::"yes","no"} Lower extremity edema: {Blank single:19197::"yes","no"} Dizzy/lightheaded: {Blank single:19197::"yes","no"}  DEPRESSION Mood status: {Blank single:19197::"controlled","uncontrolled","better","worse","exacerbated","stable"} Satisfied with current treatment?: {Blank single:19197::"yes","no"} Symptom severity: {Blank single:19197::"mild","moderate","severe"}  Duration of current treatment : {Blank single:19197::"chronic","months","years"} Side effects: {Blank  single:19197::"yes","no"} Medication compliance: {Blank single:19197::"excellent compliance","good compliance","fair compliance","poor compliance"} Psychotherapy/counseling: {Blank single:19197::"yes","no"} {Blank single:19197::"current","in the past"} Previous psychiatric medications: {Blank multiple:19196::"abilify","amitryptiline","buspar","celexa","cymbalta","depakote","effexor","lamictal","lexapro","lithium","nortryptiline","paxil","prozac","pristiq (desvenlafaxine","seroquel","wellbutrin","zoloft","zyprexa"} Depressed mood: {Blank single:19197::"yes","no"} Anxious mood: {Blank single:19197::"yes","no"} Anhedonia: {Blank single:19197::"yes","no"} Significant weight loss or gain: {Blank single:19197::"yes","no"} Insomnia: {Blank single:19197::"yes","no"} {Blank single:19197::"hard to fall asleep","hard to stay asleep"} Fatigue: {Blank single:19197::"yes","no"} Feelings of worthlessness or guilt: {Blank single:19197::"yes","no"} Impaired concentration/indecisiveness: {Blank single:19197::"yes","no"} Suicidal ideations: {Blank single:19197::"yes","no"} Hopelessness: {Blank single:19197::"yes","no"} Crying spells: {Blank single:19197::"yes","no"}    07/26/2022    4:03 PM 04/21/2022   11:07 AM 01/22/2022    8:58 AM 08/28/2021    5:36 PM 04/20/2021   11:31 AM  Depression screen PHQ 2/9  Decreased Interest 0 0 0 0 0  Down, Depressed, Hopeless 0 1 0 0 0  PHQ - 2 Score 0 1 0 0 0  Altered sleeping 0 0 0  0  Tired, decreased energy 0 0 1  0  Change in appetite 0 0 0  0  Feeling bad or failure about yourself  0 0 0  0  Trouble concentrating 0 0 0  0  Moving slowly or fidgety/restless 0 0 0  0  Suicidal thoughts 0 0 0  0  PHQ-9 Score 0 1 1  0  Difficult doing work/chores Not difficult at all        Relevant past medical, surgical, family and social history  reviewed and updated as indicated. Interim medical history since our last visit reviewed. Allergies and medications reviewed and  updated.  Review of Systems  Per HPI unless specifically indicated above     Objective:    BP (!) 145/72   Pulse 82   Temp 98.4 F (36.9 C)   Wt 205 lb (93 kg)   LMP 03/17/2013 (Approximate)   SpO2 98%   BMI 39.38 kg/m   Wt Readings from Last 3 Encounters:  07/26/22 205 lb (93 kg)  04/21/22 216 lb 12.8 oz (98.3 kg)  04/08/22 217 lb 6.4 oz (98.6 kg)    Physical Exam  Results for orders placed or performed in visit on 07/26/22  Bayer DCA Hb A1c Waived  Result Value Ref Range   HB A1C (BAYER DCA - WAIVED) 8.0 (H) 4.8 - 5.6 %      Assessment & Plan:   Problem List Items Addressed This Visit       Endocrine   Diabetes mellitus type 2 in obese ([place]) - Primary   Relevant Medications   metFORMIN (GLUCOPHAGE) 1000 MG tablet   simvastatin (ZOCOR) 40 MG tablet   lisinopril (ZESTRIL) 20 MG tablet   insulin glargine, 1 Unit Dial, (TOUJEO SOLOSTAR) 300 UNIT/ML Solostar Pen   Dulaglutide (TRULICITY) 1.5 LP/3.7TK SOPN   Other Relevant Orders   Bayer DCA Hb A1c Waived (Completed)   Comprehensive metabolic panel   Lipid Panel w/o Chol/HDL Ratio   CBC with Differential/Platelet     Other   Hypercholesteremia   Relevant Medications   simvastatin (ZOCOR) 40 MG tablet   lisinopril (ZESTRIL) 20 MG tablet   amLODipine (NORVASC) 2.5 MG tablet   Other Visit Diagnoses     ETD (Eustachian tube dysfunction), left       Relevant Medications   triamcinolone acetonide (KENALOG-40) injection 40 mg (Start on 07/26/2022  4:45 PM)        Follow up plan: Return in about 3 months (around 10/26/2022).

## 2022-07-27 LAB — CBC WITH DIFFERENTIAL/PLATELET
Basophils Absolute: 0 10*3/uL (ref 0.0–0.2)
Basos: 1 %
EOS (ABSOLUTE): 0.2 10*3/uL (ref 0.0–0.4)
Eos: 3 %
Hematocrit: 41.1 % (ref 34.0–46.6)
Hemoglobin: 13.4 g/dL (ref 11.1–15.9)
Immature Grans (Abs): 0 10*3/uL (ref 0.0–0.1)
Immature Granulocytes: 0 %
Lymphocytes Absolute: 1.6 10*3/uL (ref 0.7–3.1)
Lymphs: 26 %
MCH: 27.7 pg (ref 26.6–33.0)
MCHC: 32.6 g/dL (ref 31.5–35.7)
MCV: 85 fL (ref 79–97)
Monocytes Absolute: 0.4 10*3/uL (ref 0.1–0.9)
Monocytes: 7 %
Neutrophils Absolute: 3.8 10*3/uL (ref 1.4–7.0)
Neutrophils: 63 %
Platelets: 227 10*3/uL (ref 150–450)
RBC: 4.83 x10E6/uL (ref 3.77–5.28)
RDW: 13.3 % (ref 11.7–15.4)
WBC: 5.9 10*3/uL (ref 3.4–10.8)

## 2022-07-27 LAB — LIPID PANEL W/O CHOL/HDL RATIO
Cholesterol, Total: 163 mg/dL (ref 100–199)
HDL: 67 mg/dL (ref >39)
LDL Chol Calc (NIH): 66 mg/dL (ref 0–99)
Triglycerides: 180 mg/dL — ABNORMAL HIGH (ref 0–149)
VLDL Cholesterol Cal: 30 mg/dL (ref 5–40)

## 2022-07-27 LAB — COMPREHENSIVE METABOLIC PANEL
ALT: 18 IU/L (ref 0–32)
AST: 18 IU/L (ref 0–40)
Albumin/Globulin Ratio: 2 (ref 1.2–2.2)
Albumin: 4.3 g/dL (ref 3.9–4.9)
Alkaline Phosphatase: 36 IU/L — ABNORMAL LOW (ref 44–121)
BUN/Creatinine Ratio: 20 (ref 12–28)
BUN: 19 mg/dL (ref 8–27)
Bilirubin Total: 0.3 mg/dL (ref 0.0–1.2)
CO2: 20 mmol/L (ref 20–29)
Calcium: 9.8 mg/dL (ref 8.7–10.3)
Chloride: 98 mmol/L (ref 96–106)
Creatinine, Ser: 0.94 mg/dL (ref 0.57–1.00)
Globulin, Total: 2.2 g/dL (ref 1.5–4.5)
Glucose: 184 mg/dL — ABNORMAL HIGH (ref 70–99)
Potassium: 4.6 mmol/L (ref 3.5–5.2)
Sodium: 140 mmol/L (ref 134–144)
Total Protein: 6.5 g/dL (ref 6.0–8.5)
eGFR: 68 mL/min/{1.73_m2} (ref >59)

## 2022-07-28 NOTE — Assessment & Plan Note (Signed)
Improved with A1c of 8.0. Continue current regimen. Continue to monitor. Call with any concerns.

## 2022-07-28 NOTE — Assessment & Plan Note (Signed)
Up slightly. Will work on [employer] and recheck next visit. Continue to monitor.

## 2022-07-28 NOTE — Assessment & Plan Note (Signed)
Encouraged diet and exercise with goal of losing 1-2lbs per week.  

## 2022-07-28 NOTE — Assessment & Plan Note (Signed)
Under good control on current regimen. Continue current regimen. Continue to monitor. Call with any concerns. Refills given. Labs drawn today.   

## 2022-07-30 ENCOUNTER — Telehealth: Payer: Self-pay

## 2022-07-30 NOTE — Telephone Encounter (Signed)
PA initiated via CoverMyMeds for Hyoscyamine Sulfate 0.'125MG'$  dispersible tablets  Key : [id number]  Waiting on determination

## 2022-08-16 NOTE — Telephone Encounter (Signed)
PA denied, will scan in fax with determination.

## 2022-08-18 ENCOUNTER — Ambulatory Visit (INDEPENDENT_AMBULATORY_CARE_PROVIDER_SITE_OTHER): Payer: Medicare Other | Admitting: Nurse Practitioner

## 2022-08-18 ENCOUNTER — Encounter: Payer: Self-pay | Admitting: Nurse Practitioner

## 2022-08-18 DIAGNOSIS — H6502 Acute serous otitis media, left ear: Secondary | ICD-10-CM

## 2022-08-18 MED ORDER — AMOXICILLIN-POT CLAVULANATE 875-125 MG PO TABS: 1.0000 | ORAL_TABLET | Freq: Two times a day (BID) | ORAL | 0 refills | Status: AC

## 2022-08-18 NOTE — Progress Notes (Signed)
BP 136/79 (BP Location: Right Arm, Cuff Size: Normal)   Pulse 71   Temp 98 F (36.7 C) (Oral)   Ht 5' 0.05" (1.525 m)   Wt 208 lb 14.4 oz (94.8 kg)   LMP 03/17/2013 (Approximate)   SpO2 97%   BMI 40.73 kg/m    Subjective:    Patient ID: Sierra Copeland, female    DOB: 18-Mar-1956, 66 y.o.   MRN: [id number]  HPI: Sierra Copeland is a 66 y.o. female  Chief Complaint  Patient presents with   Otitis Media    Patient is here possible ear infection in L ear. Patient says she thinks she may have an ear infection as she is having soreness of the throat, drainage and low grade level fever. Patient says she first noticed symptoms last week. Patient says it is always her L ear that she will have issues with.    EAR PAIN Presents today for ear infection symptoms to left ear. Last treated for similar on 04/08/22, treated with Augmentin.  Duration: weeks Involved ear(s): left Severity:  6/10  Quality:  sharp, dull, aching, and throbbing Fever: yes this weekend Otorrhea: no Upper respiratory infection symptoms: no Pruritus: no Hearing loss:  a little bit Water immersion no Using Q-tips: no Recurrent otitis media: no Status: fluctuating Treatments attempted: Tylenol and sinus medications   Relevant past medical, surgical, family and social history reviewed and updated as indicated. Interim medical history since our last visit reviewed. Allergies and medications reviewed and updated.  Review of Systems  Constitutional:  Positive for fever. Negative for activity change, appetite change, chills and fatigue.  HENT:  Positive for congestion and ear pain. Negative for ear discharge, facial swelling, postnasal drip, rhinorrhea, sinus pressure, sinus pain, sneezing, sore throat and voice change.   Respiratory:  Negative for cough, chest tightness, shortness of breath and wheezing.   Cardiovascular:  Negative for chest pain, palpitations and leg swelling.  Gastrointestinal: Negative.    Neurological: Negative.   Psychiatric/Behavioral: Negative.      Per HPI unless specifically indicated above     Objective:    BP 136/79 (BP Location: Right Arm, Cuff Size: Normal)   Pulse 71   Temp 98 F (36.7 C) (Oral)   Ht 5' 0.05" (1.525 m)   Wt 208 lb 14.4 oz (94.8 kg)   LMP 03/17/2013 (Approximate)   SpO2 97%   BMI 40.73 kg/m   Wt Readings from Last 3 Encounters:  08/18/22 208 lb 14.4 oz (94.8 kg)  07/26/22 205 lb (93 kg)  04/21/22 216 lb 12.8 oz (98.3 kg)    Physical Exam Vitals and nursing note reviewed.  Constitutional:      General: She is awake. She is not in acute distress.    Appearance: She is well-developed and well-groomed. She is obese. She is not ill-appearing or toxic-appearing.  HENT:     Head: Normocephalic. No raccoon eyes.     Right Ear: Hearing, ear canal and external ear normal. No drainage or tenderness. No middle ear effusion.     Left Ear: Hearing, ear canal and external ear normal. No drainage or tenderness. A middle ear effusion is present. Tympanic membrane is injected and bulging.     Ears:     Comments: Able to visualize bony landmarks right ear, cloudy left TM with poorly visualized bony landmarks.    Nose: No rhinorrhea.     Right Sinus: No maxillary sinus tenderness or frontal sinus tenderness.  Left Sinus: No maxillary sinus tenderness or frontal sinus tenderness.     Mouth/Throat:     Mouth: Mucous membranes are moist.     Pharynx: Posterior oropharyngeal erythema (mild with cobblestone appearance) present. No pharyngeal swelling or oropharyngeal exudate.  Eyes:     General:        Right eye: No discharge.        Left eye: No discharge.     Conjunctiva/sclera: Conjunctivae normal.     Pupils: Pupils are equal, round, and reactive to light.  Neck:     Vascular: No carotid bruit.  Cardiovascular:     Rate and Rhythm: Normal rate and regular rhythm.     Heart sounds: Normal heart sounds. No murmur heard.    No gallop.   Pulmonary:     Effort: Pulmonary effort is normal. No accessory muscle usage or respiratory distress.     Breath sounds: Normal breath sounds.  Abdominal:     General: Bowel sounds are normal.     Palpations: Abdomen is soft.  Musculoskeletal:     Cervical back: Normal range of motion and neck supple.     Right lower leg: No edema.     Left lower leg: No edema.  Lymphadenopathy:     Head:     Right side of head: No submental, submandibular, preauricular or posterior auricular adenopathy.     Left side of head: No submental, submandibular, preauricular or posterior auricular adenopathy.     Cervical: No cervical adenopathy.  Skin:    General: Skin is warm and dry.  Neurological:     Mental Status: She is alert and oriented to person, place, and time.  Psychiatric:        Attention and Perception: Attention normal.        Mood and Affect: Mood normal.        Speech: Speech normal.        Behavior: Behavior normal. Behavior is cooperative.        Thought Content: Thought content normal.     Results for orders placed or performed in visit on 07/26/22  Bayer DCA Hb A1c Waived  Result Value Ref Range   HB A1C (BAYER DCA - WAIVED) 8.0 (H) 4.8 - 5.6 %  Comprehensive metabolic panel  Result Value Ref Range   Glucose 184 (H) 70 - 99 mg/dL   BUN 19 8 - 27 mg/dL   Creatinine, Ser 0.94 0.57 - 1.00 mg/dL   eGFR 68 >59 mL/min/1.73   BUN/Creatinine Ratio 20 12 - 28   Sodium 140 134 - 144 mmol/L   Potassium 4.6 3.5 - 5.2 mmol/L   Chloride 98 96 - 106 mmol/L   CO2 20 20 - 29 mmol/L   Calcium 9.8 8.7 - 10.3 mg/dL   Total Protein 6.5 6.0 - 8.5 g/dL   Albumin 4.3 3.9 - 4.9 g/dL   Globulin, Total 2.2 1.5 - 4.5 g/dL   Albumin/Globulin Ratio 2.0 1.2 - 2.2   Bilirubin Total 0.3 0.0 - 1.2 mg/dL   Alkaline Phosphatase 36 (L) 44 - 121 IU/L   AST 18 0 - 40 IU/L   ALT 18 0 - 32 IU/L  Lipid Panel w/o Chol/HDL Ratio  Result Value Ref Range   Cholesterol, Total 163 100 - 199 mg/dL    Triglycerides 180 (H) 0 - 149 mg/dL   HDL 67 >39 mg/dL   VLDL Cholesterol Cal 30 5 - 40 mg/dL   LDL Chol Calc (NIH) 66  0 - 99 mg/dL  CBC with Differential/Platelet  Result Value Ref Range   WBC 5.9 3.4 - 10.8 x10E3/uL   RBC 4.83 3.77 - 5.28 x10E6/uL   Hemoglobin 13.4 11.1 - 15.9 g/dL   Hematocrit 41.1 34.0 - 46.6 %   MCV 85 79 - 97 fL   MCH 27.7 26.6 - 33.0 pg   MCHC 32.6 31.5 - 35.7 g/dL   RDW 13.3 11.7 - 15.4 %   Platelets 227 150 - 450 x10E3/uL   Neutrophils 63 Not Estab. %   Lymphs 26 Not Estab. %   Monocytes 7 Not Estab. %   Eos 3 Not Estab. %   Basos 1 Not Estab. %   Neutrophils Absolute 3.8 1.4 - 7.0 x10E3/uL   Lymphocytes Absolute 1.6 0.7 - 3.1 x10E3/uL   Monocytes Absolute 0.4 0.1 - 0.9 x10E3/uL   EOS (ABSOLUTE) 0.2 0.0 - 0.4 x10E3/uL   Basophils Absolute 0.0 0.0 - 0.2 x10E3/uL   Immature Granulocytes 0 Not Estab. %   Immature Grans (Abs) 0.0 0.0 - 0.1 x10E3/uL      Assessment & Plan:   Problem List Items Addressed This Visit       Nervous and Auditory   Otitis media    Acute, one week with left ear pain, no recent treatment for such, last in May 2023.  At this time since TM intact will treat with Augmentin BID and recommend she take Claritin 10 MG daily.  Recommend: - Increased rest - Increasing Fluids - Acetaminophen as needed for fever/pain.  - Salt water gargling, chloraseptic spray and throat lozenges - Humidifying the air. Return to office as scheduled, Dr. Wynetta Emery for follow-up.      Relevant Medications   amoxicillin-clavulanate (AUGMENTIN) 875-125 MG tablet     Follow up plan: Return if symptoms worsen or fail to improve.

## 2022-08-18 NOTE — Patient Instructions (Signed)
Otitis Media, Adult  Otitis media is a condition in which the middle ear is red and swollen (inflamed) and full of fluid. The middle ear is the part of the ear that contains bones for hearing as well as air that helps send sounds to the brain. The condition usually goes away on its own. What are the causes? This condition is caused by a blockage in the eustachian tube. This tube connects the middle ear to the back of the nose. It normally allows air into the middle ear. The blockage is caused by fluid or swelling. Problems that can cause blockage include: A cold or infection that affects the nose, mouth, or throat. Allergies. An irritant, such as tobacco smoke. Adenoids that have become large. The adenoids are soft tissue located in the back of the throat, behind the nose and the roof of the mouth. Growth or swelling in the upper part of the throat, just behind the nose (nasopharynx). Damage to the ear caused by a change in pressure. This is called barotrauma. What increases the risk? You are more likely to develop this condition if you: Smoke or are exposed to tobacco smoke. Have an opening in the roof of your mouth (cleft palate). Have acid reflux. Have problems in your body's defense system (immune system). What are the signs or symptoms? Symptoms of this condition include: Ear pain. Fever. Problems with hearing. Being tired. Fluid leaking from the ear. Ringing in the ear. How is this treated? This condition can go away on its own within 3-5 days. But if the condition is caused by germs (bacteria) and does not go away on its own, or if it keeps coming back, your doctor may: Give you antibiotic medicines. Give you medicines for pain. Follow these instructions at home: Take over-the-counter and prescription medicines only as told by your doctor. If you were prescribed an antibiotic medicine, take it as told by your doctor. Do not stop taking it even if you start to feel better. Keep  all follow-up visits. Contact a doctor if: You have bleeding from your nose. There is a lump on your neck. You are not feeling better in 5 days. You feel worse instead of better. Get help right away if: You have pain that is not helped with medicine. You have swelling, redness, or pain around your ear. You get a stiff neck. You cannot move part of your face (paralysis). You notice that the bone behind your ear hurts when you touch it. You get a very bad headache. Summary Otitis media means that the middle ear is red, swollen, and full of fluid. This condition usually goes away on its own. If the problem does not go away, treatment may be needed. You may be given medicines to treat the infection or to treat your pain. If you were prescribed an antibiotic medicine, take it as told by your doctor. Do not stop taking it even if you start to feel better. Keep all follow-up visits. This information is not intended to replace advice given to you by your health care provider. Make sure you discuss any questions you have with your health care provider. Document Revised: 02/23/2021 Document Reviewed: 02/23/2021 Elsevier Patient Education  2023 [employer]vier Inc.  

## 2022-08-18 NOTE — Assessment & Plan Note (Signed)
Acute, one week with left ear pain, no recent treatment for such, last in May 2023.  At this time since TM intact will treat with Augmentin BID and recommend she take Claritin 10 MG daily.  Recommend: - Increased rest - Increasing Fluids - Acetaminophen as needed for fever/pain.  - Salt water gargling, chloraseptic spray and throat lozenges - Humidifying the air. Return to office as scheduled, Dr. Wynetta Emery for follow-up.

## 2022-08-27 ENCOUNTER — Ambulatory Visit: Payer: Medicare Other

## 2022-09-01 ENCOUNTER — Ambulatory Visit (INDEPENDENT_AMBULATORY_CARE_PROVIDER_SITE_OTHER): Payer: Medicare Other | Admitting: *Deleted

## 2022-09-01 DIAGNOSIS — Z0000 Encounter for general adult medical examination without abnormal findings: Secondary | ICD-10-CM

## 2022-09-01 NOTE — Progress Notes (Signed)
Subjective:   Sierra Copeland is a 66 y.o. female who presents for Medicare Annual (Subsequent) preventive examination.  I connected with  [name] on 09/01/22 by a telephone enabled telemedicine application and verified that I am speaking with the correct person using two identifiers.   I discussed the limitations of evaluation and management by telemedicine. The patient expressed understanding and agreed to proceed.  Patient location: home  Provider location: Tele-Health-home    Review of Systems           Objective:    There were no vitals filed for this visit. There is no height or weight on file to calculate BMI.     02/18/2022   10:58 AM 02/02/2022    3:10 PM 09/24/2021    7:01 AM 08/28/2021    5:24 PM 08/30/2017    9:21 AM 05/13/2016    7:46 AM 01/21/2016    9:39 AM  Advanced Directives  Does Patient Have a Medical Advance Directive? No No No No Yes Yes Yes  Type of Personnel officer;Living will Living will;Healthcare Power of Attorney   Does patient want to make changes to medical advance directive?     No - Patient declined    Copy of [place] in Chart?     No - copy requested No - copy requested   Would patient like information on creating a medical advance directive?  No - Patient declined No - Patient declined No - Patient declined       Current Medications (verified) Outpatient Encounter Medications as of 09/01/2022  Medication Sig   acetaminophen (TYLENOL) 650 MG CR tablet Take 650 mg by mouth every 8 (eight) hours as needed for pain.   amLODipine (NORVASC) 2.5 MG tablet Take 1 tablet (2.5 mg total) by mouth daily.   aspirin-acetaminophen-caffeine (EXCEDRIN MIGRAINE) 250-250-65 MG tablet Take 1 tablet by mouth every 6 (six) hours as needed for headache.   Cholecalciferol (VITAMIN D3) 2000 units capsule Take 2,000 Units by mouth at bedtime.    Coenzyme Q10 (COQ-10) 100 MG CAPS Take 100 mg by mouth daily.    Continuous Blood Gluc Sensor (FREESTYLE LIBRE 14 DAY SENSOR) MISC CHANGE EVERY 14 DAYS   Dulaglutide (TRULICITY) 1.65 YO/0.65YO SOPN Inject 1.5 mg into the skin once a week.   escitalopram (LEXAPRO) 20 MG tablet Take 1 tablet (20 mg total) by mouth daily.   fexofenadine (ALLEGRA) 180 MG tablet Take 180 mg by mouth daily with breakfast.    fluticasone (FLONASE) 50 MCG/ACT nasal spray Place 1-2 sprays into both nostrils every evening.    gabapentin (NEURONTIN) 100 MG capsule Take 2 capsules (200 mg total) by mouth 2 (two) times daily.   Glucagon (GVOKE HYPOPEN 2-PACK) 1 MG/0.2ML SOAJ Inject 1 each into the skin as needed.   HUMALOG KWIKPEN 100 UNIT/ML KwikPen INJECT 10-20 UNITS INTO THE SKIN WITH BREAKFAST, WITH LUNCH AND WITH EVENING MEAL   Hyoscyamine Sulfate SL 0.125 MG SUBL Place 0.125 mg under the tongue daily as needed (cramping).   insulin glargine, 1 Unit Dial, (TOUJEO SOLOSTAR) 300 UNIT/ML Solostar Pen 80 units at bedtime and 20 units at lunch time   Insulin Syringe-Needle U-100 30G X 5/16" 0.3 ML MISC    lisinopril (ZESTRIL) 20 MG tablet Take 1 tablet (20 mg total) by mouth daily.   Magnesium 250 MG TABS Take 250 mg by mouth daily.   metFORMIN (GLUCOPHAGE) 1000 MG tablet Take  1 tablet (1,000 mg total) by mouth 2 (two) times daily with a meal.   omeprazole (PRILOSEC) 40 MG capsule Take 1 capsule (40 mg total) by mouth daily.   simvastatin (ZOCOR) 40 MG tablet Take 1 tablet (40 mg total) by mouth at bedtime.   tiZANidine (ZANAFLEX) 2 MG tablet Take 2 mg by mouth at bedtime.    traMADol (ULTRAM) 50 MG tablet Take 100 mg by mouth 2 (two) times daily.   ULTRACARE PEN NEEDLES 33G X 4 MM MISC    zinc gluconate 50 MG tablet Take 50 mg by mouth daily.   Facility-Administered Encounter Medications as of 09/01/2022  Medication   triamcinolone acetonide (KENALOG-40) injection 40 mg    Allergies (verified) Prednisone   History: Past Medical History:  Diagnosis Date   Abnormal mammogram     Anxiety    Arthritis    Back pain    spinal stenosis and neck bone spur   Cancer ([place])    Breast   Car sickness    Collagenous colitis    Depression    Diabetes mellitus without complication (HCC)    GERD (gastroesophageal reflux disease)    occ-no meds   Hair loss    TAKING THYROTAIN   Headache    migraines   Hyperlipemia    Hypertension    Osteoarthritis    seen by Rheum, not enoug evidence to support RA   Pinched nerve    PONV (postoperative nausea and vomiting)    after tonsillectomy   Rosacea    Seborrheic dermatitis    Sleep apnea    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 Tumor is triple negative. She is dated radiation in August 2018. Continues to have problems with lymphedema   Wears glasses    Past Surgical History:  Procedure Laterality Date   ACHILLES TENDON REPAIR  2007   right   BREAST LUMPECTOMY  2018   BREAST LUMPECTOMY Left 03/2017   BREAST SURGERY  2011   lt br bx-non cancer   CARPAL TUNNEL RELEASE  1989   right and left   CHOLECYSTECTOMY N/A 09/06/2017   Gallbladder not removed due to complications. Procedure: LAPAROSCOPY;  Surgeon: Leonie Green, MD;  Location: [facility] ORS;  Service: General;  Laterality: N/A;   COLONOSCOPY     COLONOSCOPY WITH PROPOFOL N/A 09/24/2021   Procedure: COLONOSCOPY WITH PROPOFOL;  Surgeon: Annamaria Helling, DO;  Location: [facility] ENDOSCOPY;  Service: Gastroenterology;  Laterality: N/A;  IDDM   epidural steroid injection x2  2017   ESOPHAGOGASTRODUODENOSCOPY N/A 01/21/2016   Procedure: ESOPHAGOGASTRODUODENOSCOPY (EGD);  Surgeon: Hulen Luster, MD;  Location: [place];  Service: Gastroenterology;  Laterality: N/A;  Please call at work [phone] Diabetic - insulin   ESOPHAGOGASTRODUODENOSCOPY N/A 09/24/2021   Procedure: ESOPHAGOGASTRODUODENOSCOPY (EGD);  Surgeon: Annamaria Helling, DO;  Location: [facility] ENDOSCOPY;  Service: Gastroenterology;  Laterality: N/A;    FINGER EXPLORATION  2013   left long    Lynch's Syndrome     MASTECTOMY Left 2018   Partial   NERVE EXPLORATION Left 10/03/2013   Procedure: NERVE EXPLORATION/ LEFT LONG RADIAL DISTAL NERVE neurolysis;  Surgeon: Schuyler Amor, MD;  Location: [place];  Service: Orthopedics;  Laterality: Left;   SHOULDER ARTHROSCOPY WITH DEBRIDEMENT AND BICEP TENDON REPAIR Right 05/13/2016   Procedure: Arthroscopic debridement, open decompression, rotator cuff repair, tenodesis,right shoulder.;  Surgeon: Corky Mull, MD;  Location: [facility] ORS;  Service: Orthopedics;  Laterality: Right;   TONSILLECTOMY  2000   UVULOPALATOPHARYNGOPLASTY  1998   and tonsils-tongue pexi    BREAST BIOPSY Left 03/14/2017      Family History  Problem Relation Age of Onset   Liver cancer Mother 59       gallbaldder   Hypertension Mother    Hyperlipidemia Mother    Hyperlipidemia Father    Hypertension Father    Diabetes Maternal Aunt    Diabetes Maternal Uncle    Heart disease Maternal Uncle    Breast cancer Paternal Aunt    Diabetes Paternal Aunt    Breast cancer Paternal Aunt    Diabetes Paternal Uncle    Dementia Maternal Grandmother    Stroke Maternal Grandfather    Diabetes Paternal Grandmother    Heart disease Paternal Grandfather    Other Cousin 6       BRCA or similar mutation positive   Social History   Socioeconomic History   Marital status: Married    Spouse name: Not on file   Number of children: Not on file   Years of education: Not on file   Highest education level: Not on file  Occupational History   Not on file  Tobacco Use   Smoking status: Former    Packs/day: 1.00    Years: 10.00    Total pack years: 10.00    Types: Cigarettes    Quit date: 09/28/1986    Years since quitting: 35.9   Smokeless tobacco: Never  Vaping Use   Vaping Use: Never used  Substance and Sexual Activity   Alcohol use: No   Drug use: No   Sexual activity: Yes  Other Topics Concern   Not  on file  Social History Narrative   Not on file   Social Determinants of Health   Financial Resource Strain: Low Risk  (08/28/2021)   Overall Financial Resource Strain (CARDIA)    Difficulty of Paying Living Expenses: Not hard at all  Food Insecurity: No Food Insecurity (08/28/2021)   Hunger Vital Sign    Worried About Running Out of Food in the Last Year: Never true    Ran Out of Food in the Last Year: Never true  Transportation Needs: No Transportation Needs (08/28/2021)   PRAPARE - Hydrologist (Medical): No    Lack of Transportation (Non-Medical): No  Physical Activity: Inactive (08/28/2021)   Exercise Vital Sign    Days of Exercise per Week: 0 days    Minutes of Exercise per Session: 0 min  Stress: No Stress Concern Present (08/28/2021)   [place]    Feeling of Stress : Not at all  Social Connections: Moderately Integrated (08/28/2021)   Social Connection and Isolation Panel [NHANES]    Frequency of Communication with Friends and Family: Twice a week    Frequency of Social Gatherings with Friends and Family: Once a week    Attends Religious Services: More than 4 times per year    Active Member of [employer] or Organizations: No    Attends Archivist Meetings: Never    Marital Status: Married    Tobacco Counseling Counseling given: Not Answered   Clinical Intake:                 Diabetic?  Yes  Nutrition Risk Assessment:  Has the patient had any N/V/D within the last 2 months?  No  Does the patient  have any non-healing wounds?  No  Has the patient had any unintentional weight loss or weight gain?  No   Diabetes:  Is the patient diabetic?  Yes  If diabetic, was a CBG obtained today?  No  Did the patient bring in their glucometer from home?  No  How often do you monitor your CBG's? 2.   Financial Strains and Diabetes Management:  Are you having any  financial strains with the device, your supplies or your medication? No .  Does the patient want to be seen by Chronic Care Management for management of their diabetes?  No  Would the patient like to be referred to a Nutritionist or for Diabetic Management?  No   Diabetic Exams:  Diabetic Eye Exam:.  Pt has been advised about the importance in completing this exam.  Diabetic Foot Exam: Pt has been advised about the importance in completing this exam.         Activities of Daily Living    02/18/2022   10:59 AM 02/18/2022   10:57 AM  In your present state of health, do you have any difficulty performing the following activities:  Hearing?  0  Vision?  0  Difficulty concentrating or making decisions?  0  Walking or climbing stairs?  1  Comment  yes due to bad knees and arthrititis  Dressing or bathing?  0  Doing errands, shopping? 0     Patient Care Team: Valerie Roys, DO as PCP - General (Family Medicine) Lloyd Huger, MD as Consulting Physician (Oncology)  Indicate any recent Medical Services you may have received from other than [facility] providers in the past year (date may be approximate).     Assessment:   This is a routine wellness examination for Sierra Copeland.  Hearing/Vision screen No results found.  Dietary issues and exercise activities discussed:     Goals Addressed   None    Depression Screen    08/18/2022   10:20 AM 07/26/2022    4:03 PM 04/21/2022   11:07 AM 01/22/2022    8:58 AM 08/28/2021    5:36 PM 04/20/2021   11:31 AM 02/24/2021    2:05 PM  PHQ 2/9 Scores  PHQ - 2 Score 0 0 1 0 0 0 0  PHQ- 9 Score 0 0 1 1  0 0    Fall Risk    08/18/2022   10:19 AM 04/21/2022   11:08 AM 02/11/2022    3:52 PM 01/22/2022    8:54 AM 02/04/2021    3:02 PM  Fall Risk   Falls in the past year? 1 0 0 1 0  Number falls in past yr: 1 0  0   Injury with Fall? 0 0  1   Risk for fall due to : History of fall(s) No Fall Risks  No Fall Risks   Follow up Falls evaluation  completed Falls evaluation completed  Falls evaluation completed     [place]:  Any stairs in or around the home? No  If so, are there any without handrails? No  Home free of loose throw rugs in walkways, pet beds, electrical cords, etc? Yes  Adequate lighting in your home to reduce risk of falls? Yes   ASSISTIVE DEVICES UTILIZED TO PREVENT FALLS:  Life alert? No  Use of a cane, walker or w/c? No  Grab bars in the bathroom? No  Shower chair or bench in shower? No  Elevated toilet seat  or a handicapped toilet? No   TIMED UP AND GO:  Was the test performed? No .    Cognitive Function:        08/28/2021    5:25 PM  6CIT Screen  What Year? 0 points  What month? 0 points  What time? 0 points  Count back from 20 0 points  Months in reverse 0 points  Repeat phrase 0 points  Total Score 0 points    Immunizations Immunization History  Administered Date(s) Administered   Influenza,inj,Quad PF,6+ Mos 09/03/2015, 08/31/2019, 09/23/2020   Influenza-Unspecified 08/08/2016, 09/07/2017, 09/13/2018   Pneumococcal Polysaccharide-23 11/29/2008   Tdap 12/04/2015    TDAP status: Up to date  Flu Vaccine status: Due, Education has been provided regarding the importance of this vaccine. Advised may receive this vaccine at local pharmacy or Health Dept. Aware to provide a copy of the vaccination record if obtained from local pharmacy or Health Dept. Verbalized acceptance and understanding.  Pneumococcal vaccine status: Up to date  Covid-19 vaccine status: Information provided on how to obtain vaccines.   Qualifies for Shingles Vaccine? Yes   Zostavax completed No   Shingrix Completed?: No.    Education has been provided regarding the importance of this vaccine. Patient has been advised to call insurance company to determine out of pocket expense if they have not yet received this vaccine. Advised may also receive vaccine at local pharmacy or Health  Dept. Verbalized acceptance and understanding.  Screening Tests Health Maintenance  Topic Date Due   COVID-19 Vaccine (1) Never done   Diabetic kidney evaluation - Urine ACR  05/02/2021   INFLUENZA VACCINE  06/29/2022   OPHTHALMOLOGY EXAM  08/10/2022   Zoster Vaccines- Shingrix (1 of 2) 10/26/2022 (Originally 10/17/1975)   HEMOGLOBIN A1C  01/26/2023   Diabetic kidney evaluation - GFR measurement  07/27/2023   FOOT EXAM  07/27/2023   PAP SMEAR-Modifier  02/02/2024   MAMMOGRAM  07/06/2024   TETANUS/TDAP  12/03/2025   COLONOSCOPY (Pts 45-83yr Insurance coverage will need to be confirmed)  09/25/2031   Hepatitis C Screening  Completed   HIV Screening  Completed   HPV VACCINES  Aged Out    Health Maintenance  Health Maintenance Due  Topic Date Due   COVID-19 Vaccine (1) Never done   Diabetic kidney evaluation - Urine ACR  05/02/2021   INFLUENZA VACCINE  06/29/2022   OPHTHALMOLOGY EXAM  08/10/2022    Colorectal cancer screening: Type of screening: Colonoscopy. Completed 2022. Repeat every 3 years  Mammogram status: Completed  . Repeat every year    Lung Cancer Screening: (Low Dose CT Chest recommended if Age 65-80years, 30 pack-year currently smoking OR have quit w/in 15years.) does not qualify.   Lung Cancer Screening Referral:   Additional Screening:  Hepatitis C Screening: does not qualify; Completed 2018  Vision Screening: Recommended annual ophthalmology exams for early detection of glaucoma and other disorders of the eye. Is the patient up to date with their annual eye exam?  Yes  Who is the provider or what is the name of the office in which the patient attends annual eye exams[place] [facility] [facility] is not established with a provider, would they like to be referred to a provider to establish care? No .   Dental Screening: Recommended annual dental exams for proper oral hygiene  Community Resource Referral / Chronic Care Management: CRR required this visit?  No    CCM required this visit?  No  Plan:     I have personally reviewed and noted the following in the patient's chart:   Medical and social history Use of alcohol, tobacco or illicit drugs  Current medications and supplements including opioid prescriptions. Patient is not currently taking opioid prescriptions. Functional ability and status Nutritional status Physical activity Advanced directives List of other physicians Hospitalizations, surgeries, and ER visits in previous 12 months Vitals Screenings to include cognitive, depression, and falls Referrals and appointments  In addition, I have reviewed and discussed with patient certain preventive protocols, quality metrics, and best practice recommendations. A written personalized care plan for preventive services as well as general preventive health recommendations were provided to patient.     Leroy Kennedy, LPN   [id number]   Nurse Notes:

## 2022-09-01 NOTE — Patient Instructions (Addendum)
[name] , Thank you for taking time to come for your Medicare Wellness Visit. I appreciate your ongoing commitment to your health goals. Please review the following plan we discussed and let me know if I can assist you in the future.   Screening recommendations/referrals: Colonoscopy: up to date Mammogram: up to date  Recommended yearly ophthalmology/optometry visit for glaucoma screening and checkup Recommended yearly dental visit for hygiene and checkup  Vaccinations: Influenza vaccine: Education provided Pneumococcal vaccine: up  to date Tdap vaccine: up to date Shingles vaccine: Education provided    Advanced directives: not on file  Conditions/risks identified:   Next appointment: 10-06-2022 @ 3:20 Sierra Copeland 40-64Years and Older, Female Preventive care refers to lifestyle choices and visits with your health care provider that can promote health and wellness. What does preventive care include? A yearly physical exam. This is also called an annual well check. Dental exams once or twice a year. Routine eye exams. Ask your health care provider how often you should have your eyes checked. Personal lifestyle choices, including: Daily care of your teeth and gums. Regular physical activity. Eating a healthy diet. Avoiding tobacco and drug use. Limiting alcohol use. Practicing safe sex. Taking low-dose aspirin every day. Taking vitamin and mineral supplements as recommended by your health care provider. What happens during an annual well check? The services and screenings done by your health care provider during your annual well check will depend on your age, overall health, lifestyle risk factors, and family history of disease. Counseling  Your health care provider may ask you questions about your: Alcohol use. Tobacco use. Drug use. Emotional well-being. Home and relationship well-being. Sexual activity. Eating habits. History of falls. Memory and  ability to understand (cognition). Work and work Statistician. Reproductive health. Screening  You may have the following tests or measurements: Height, weight, and BMI. Blood pressure. Lipid and cholesterol levels. These may be checked every 5 years, or more frequently if you are over 49 years old. Skin check. Lung cancer screening. You may have this screening every year starting at age 48 if you have a 30-pack-year history of smoking and currently smoke or have quit within the past 15 years. Fecal occult blood test (FOBT) of the stool. You may have this test every year starting at age 59. Flexible sigmoidoscopy or colonoscopy. You may have a sigmoidoscopy every 5 years or a colonoscopy every 10 years starting at age 71. Hepatitis C blood test. Hepatitis B blood test. Sexually transmitted disease (STD) testing. Diabetes screening. This is done by checking your blood sugar (glucose) after you have not eaten for a while (fasting). You may have this done every 1-3 years. Bone density scan. This is done to screen for osteoporosis. You may have this done starting at age 57. Mammogram. This may be done every 1-2 years. Talk to your health care provider about how often you should have regular mammograms. Talk with your health care provider about your test results, treatment options, and if necessary, the need for more tests. Vaccines  Your health care provider may recommend certain vaccines, such as: Influenza vaccine. This is recommended every year. Tetanus, diphtheria, and acellular pertussis (Tdap, Td) vaccine. You may need a Td booster every 10 years. Zoster vaccine. You may need this after age 81. Pneumococcal 13-valent conjugate (PCV13) vaccine. One dose is recommended after age 32. Pneumococcal polysaccharide (PPSV23) vaccine. One dose is recommended after age 26. Talk to your health care provider about which screenings and  vaccines you need and how often you need them. This information is  not intended to replace advice given to you by your health care provider. Make sure you discuss any questions you have with your health care provider. Document Released: 12/12/2015 Document Revised: 08/04/2016 Document Reviewed: 09/16/2015 Elsevier Interactive Patient Education  2017 [place] Prevention in the Home Falls can cause injuries. They can happen to people of all ages. There are many things you can do to make your home safe and to help prevent falls. What can I do on the outside of my home? Regularly fix the edges of walkways and driveways and fix any cracks. Remove anything that might make you trip as you walk through a door, such as a raised step or threshold. Trim any bushes or trees on the path to your home. Use bright outdoor lighting. Clear any walking paths of anything that might make someone trip, such as rocks or tools. Regularly check to see if handrails are loose or broken. Make sure that both sides of any steps have handrails. Any raised decks and porches should have guardrails on the edges. Have any leaves, snow, or ice cleared regularly. Use sand or salt on walking paths during winter. Clean up any spills in your garage right away. This includes oil or grease spills. What can I do in the bathroom? Use night lights. Install grab bars by the toilet and in the tub and shower. Do not use towel bars as grab bars. Use non-skid mats or decals in the tub or shower. If you need to sit down in the shower, use a plastic, non-slip stool. Keep the floor dry. Clean up any water that spills on the floor as soon as it happens. Remove soap buildup in the tub or shower regularly. Attach bath mats securely with double-sided non-slip rug tape. Do not have throw rugs and other things on the floor that can make you trip. What can I do in the bedroom? Use night lights. Make sure that you have a light by your bed that is easy to reach. Do not use any sheets or blankets that  are too big for your bed. They should not hang down onto the floor. Have a firm chair that has side arms. You can use this for support while you get dressed. Do not have throw rugs and other things on the floor that can make you trip. What can I do in the kitchen? Clean up any spills right away. Avoid walking on wet floors. Keep items that you use a lot in easy-to-reach places. If you need to reach something above you, use a strong step stool that has a grab bar. Keep electrical cords out of the way. Do not use floor polish or wax that makes floors slippery. If you must use wax, use non-skid floor wax. Do not have throw rugs and other things on the floor that can make you trip. What can I do with my stairs? Do not leave any items on the stairs. Make sure that there are handrails on both sides of the stairs and use them. Fix handrails that are broken or loose. Make sure that handrails are as long as the stairways. Check any carpeting to make sure that it is firmly attached to the stairs. Fix any carpet that is loose or worn. Avoid having throw rugs at the top or bottom of the stairs. If you do have throw rugs, attach them to the floor with carpet tape. Make  sure that you have a light switch at the top of the stairs and the bottom of the stairs. If you do not have them, ask someone to add them for you. What else can I do to help prevent falls? Wear shoes that: Do not have high heels. Have rubber bottoms. Are comfortable and fit you well. Are closed at the toe. Do not wear sandals. If you use a stepladder: Make sure that it is fully opened. Do not climb a closed stepladder. Make sure that both sides of the stepladder are locked into place. Ask someone to hold it for you, if possible. Clearly mark and make sure that you can see: Any grab bars or handrails. First and last steps. Where the edge of each step is. Use tools that help you move around (mobility aids) if they are needed. These  include: Canes. Walkers. Scooters. Crutches. Turn on the lights when you go into a dark area. Replace any light bulbs as soon as they burn out. Set up your furniture so you have a clear path. Avoid moving your furniture around. If any of your floors are uneven, fix them. If there are any pets around you, be aware of where they are. Review your medicines with your doctor. Some medicines can make you feel dizzy. This can increase your chance of falling. Ask your doctor what other things that you can do to help prevent falls. This information is not intended to replace advice given to you by your health care provider. Make sure you discuss any questions you have with your health care provider. Document Released: 09/11/2009 Document Revised: 04/22/2016 Document Reviewed: 12/20/2014 Elsevier Interactive Patient Education  2017 [employer].

## 2022-09-06 ENCOUNTER — Ambulatory Visit (INDEPENDENT_AMBULATORY_CARE_PROVIDER_SITE_OTHER): Payer: Medicare Other

## 2022-09-06 DIAGNOSIS — Z23 Encounter for immunization: Secondary | ICD-10-CM

## 2022-10-26 ENCOUNTER — Ambulatory Visit: Payer: Medicare Other | Admitting: Family Medicine

## 2022-11-09 ENCOUNTER — Encounter: Payer: Self-pay | Admitting: Family Medicine

## 2022-11-09 ENCOUNTER — Ambulatory Visit (INDEPENDENT_AMBULATORY_CARE_PROVIDER_SITE_OTHER): Payer: Medicare Other | Admitting: Family Medicine

## 2022-11-09 VITALS — BP 157/79 | HR 74 | Temp 98.7°F | Ht 60.05 in | Wt 214.4 lb

## 2022-11-09 DIAGNOSIS — Z13820 Encounter for screening for osteoporosis: Secondary | ICD-10-CM | POA: Diagnosis not present

## 2022-11-09 DIAGNOSIS — E1169 Type 2 diabetes mellitus with other specified complication: Secondary | ICD-10-CM

## 2022-11-09 DIAGNOSIS — H6502 Acute serous otitis media, left ear: Secondary | ICD-10-CM | POA: Diagnosis not present

## 2022-11-09 DIAGNOSIS — E669 Obesity, unspecified: Secondary | ICD-10-CM

## 2022-11-09 DIAGNOSIS — Z23 Encounter for immunization: Secondary | ICD-10-CM | POA: Diagnosis not present

## 2022-11-09 MED ORDER — AMOXICILLIN-POT CLAVULANATE 875-125 MG PO TABS: 1.0000 | ORAL_TABLET | Freq: Two times a day (BID) | ORAL | 0 refills | Status: DC

## 2022-11-09 NOTE — Patient Instructions (Signed)
Please call to schedule your bone density: [facility] at [place]: [address], [place], [place] [address] Phone: [phone]  [place] at [facility] [address] [place],  [place]  [address] Phone: [phone]

## 2022-11-09 NOTE — Progress Notes (Signed)
BP (!) 157/79 (BP Location: Right Arm, Cuff Size: Normal)   Pulse 74   Temp 98.7 F (37.1 C) (Oral)   Ht 5' 0.05" (1.525 m)   Wt 214 lb 6.4 oz (97.3 kg)   LMP 03/17/2013 (Approximate)   SpO2 96%   BMI 41.80 kg/m    Subjective:    Patient ID: Sierra Copeland, female    DOB: 02/02/1956, 66 y.o.   MRN: [id number]  HPI: Sierra Copeland is a 66 y.o. female  Chief Complaint  Patient presents with   Diabetes    Patient is scheduled for Diabetic Eye Exam in January.    Hypertension   DIABETES Hypoglycemic episodes:no- but got down 74 Polydipsia/polyuria: no Visual disturbance: no Chest pain: no Paresthesias: no Glucose Monitoring: yes  Accucheck frequency: TID  Fasting glucose: 74 Taking Insulin?: yes Blood Pressure Monitoring: not checking Retinal Examination: Not up to Date Foot Exam: Up to Date Diabetic Education: Completed Pneumovax: Up to Date Influenza: Up to Date Aspirin: yes  EAG CLOGGED Duration: couple weeks Involved ear(s):  left Sensation of feeling clogged/plugged: yes Decreased/muffled hearing:yes Ear pain: no Fever: no Otorrhea: no Hearing loss: no Upper respiratory infection symptoms: yes Using Q-Tips: no Status: worse History of cerumenosis: no Treatments attempted: none   Relevant past medical, surgical, family and social history reviewed and updated as indicated. Interim medical history since our last visit reviewed. Allergies and medications reviewed and updated.  Review of Systems  Constitutional: Negative.   HENT:  Positive for congestion and ear pain. Negative for dental problem, drooling, ear discharge, facial swelling, hearing loss, mouth sores, nosebleeds, postnasal drip, rhinorrhea, sinus pressure, sinus pain, sneezing, sore throat, tinnitus, trouble swallowing and voice change.   Respiratory: Negative.    Cardiovascular: Negative.   Gastrointestinal: Negative.   Musculoskeletal: Negative.   Neurological: Negative.    Psychiatric/Behavioral: Negative.      Per HPI unless specifically indicated above     Objective:    BP (!) 157/79 (BP Location: Right Arm, Cuff Size: Normal)   Pulse 74   Temp 98.7 F (37.1 C) (Oral)   Ht 5' 0.05" (1.525 m)   Wt 214 lb 6.4 oz (97.3 kg)   LMP 03/17/2013 (Approximate)   SpO2 96%   BMI 41.80 kg/m   Wt Readings from Last 3 Encounters:  11/09/22 214 lb 6.4 oz (97.3 kg)  08/18/22 208 lb 14.4 oz (94.8 kg)  07/26/22 205 lb (93 kg)    Physical Exam Vitals and nursing note reviewed.  Constitutional:      General: She is not in acute distress.    Appearance: Normal appearance. She is not ill-appearing, toxic-appearing or diaphoretic.  HENT:     Head: Normocephalic and atraumatic.     Right Ear: Tympanic membrane, ear canal and external ear normal.     Left Ear: External ear normal. Tympanic membrane is erythematous and bulging.     Nose: Nose normal.     Mouth/Throat:     Mouth: Mucous membranes are moist.     Pharynx: Oropharynx is clear.  Eyes:     General: No scleral icterus.       Right eye: No discharge.        Left eye: No discharge.     Extraocular Movements: Extraocular movements intact.     Conjunctiva/sclera: Conjunctivae normal.     Pupils: Pupils are equal, round, and reactive to light.  Cardiovascular:     Rate and Rhythm: Normal rate and regular  rhythm.     Pulses: Normal pulses.     Heart sounds: Normal heart sounds. No murmur heard.    No friction rub. No gallop.  Pulmonary:     Effort: Pulmonary effort is normal. No respiratory distress.     Breath sounds: Normal breath sounds. No stridor. No wheezing, rhonchi or rales.  Chest:     Chest wall: No tenderness.  Musculoskeletal:        General: Normal range of motion.     Cervical back: Normal range of motion and neck supple.  Skin:    General: Skin is warm and dry.     Capillary Refill: Capillary refill takes less than 2 seconds.     Coloration: Skin is not jaundiced or pale.      Findings: No bruising, erythema, lesion or rash.  Neurological:     General: No focal deficit present.     Mental Status: She is alert and oriented to person, place, and time. Mental status is at baseline.  Psychiatric:        Mood and Affect: Mood normal.        Behavior: Behavior normal.        Thought Content: Thought content normal.        Judgment: Judgment normal.     Results for orders placed or performed in visit on 07/26/22  Bayer DCA Hb A1c Waived  Result Value Ref Range   HB A1C (BAYER DCA - WAIVED) 8.0 (H) 4.8 - 5.6 %  Comprehensive metabolic panel  Result Value Ref Range   Glucose 184 (H) 70 - 99 mg/dL   BUN 19 8 - 27 mg/dL   Creatinine, Ser 0.94 0.57 - 1.00 mg/dL   eGFR 68 >59 mL/min/1.73   BUN/Creatinine Ratio 20 12 - 28   Sodium 140 134 - 144 mmol/L   Potassium 4.6 3.5 - 5.2 mmol/L   Chloride 98 96 - 106 mmol/L   CO2 20 20 - 29 mmol/L   Calcium 9.8 8.7 - 10.3 mg/dL   Total Protein 6.5 6.0 - 8.5 g/dL   Albumin 4.3 3.9 - 4.9 g/dL   Globulin, Total 2.2 1.5 - 4.5 g/dL   Albumin/Globulin Ratio 2.0 1.2 - 2.2   Bilirubin Total 0.3 0.0 - 1.2 mg/dL   Alkaline Phosphatase 36 (L) 44 - 121 IU/L   AST 18 0 - 40 IU/L   ALT 18 0 - 32 IU/L  Lipid Panel w/o Chol/HDL Ratio  Result Value Ref Range   Cholesterol, Total 163 100 - 199 mg/dL   Triglycerides 180 (H) 0 - 149 mg/dL   HDL 67 >39 mg/dL   VLDL Cholesterol Cal 30 5 - 40 mg/dL   LDL Chol Calc (NIH) 66 0 - 99 mg/dL  CBC with Differential/Platelet  Result Value Ref Range   WBC 5.9 3.4 - 10.8 x10E3/uL   RBC 4.83 3.77 - 5.28 x10E6/uL   Hemoglobin 13.4 11.1 - 15.9 g/dL   Hematocrit 41.1 34.0 - 46.6 %   MCV 85 79 - 97 fL   MCH 27.7 26.6 - 33.0 pg   MCHC 32.6 31.5 - 35.7 g/dL   RDW 13.3 11.7 - 15.4 %   Platelets 227 150 - 450 x10E3/uL   Neutrophils 63 Not Estab. %   Lymphs 26 Not Estab. %   Monocytes 7 Not Estab. %   Eos 3 Not Estab. %   Basos 1 Not Estab. %   Neutrophils Absolute 3.8 1.4 - 7.0 x10E3/uL  Lymphocytes Absolute 1.6 0.7 - 3.1 x10E3/uL   Monocytes Absolute 0.4 0.1 - 0.9 x10E3/uL   EOS (ABSOLUTE) 0.2 0.0 - 0.4 x10E3/uL   Basophils Absolute 0.0 0.0 - 0.2 x10E3/uL   Immature Granulocytes 0 Not Estab. %   Immature Grans (Abs) 0.0 0.0 - 0.1 x10E3/uL      Assessment & Plan:   Problem List Items Addressed This Visit       Endocrine   Diabetes mellitus type 2 in obese ([place]) - Primary    Rechecking A1c today. Await results. Treat as needed.       Relevant Orders   Hgb A1c w/o eAG   Urine Microalbumin w/creat. ratio     Nervous and Auditory   Otitis media    Will treat with augmentin and recheck for resolution in about 2 weeks. Call with any concerns.       Relevant Medications   amoxicillin-clavulanate (AUGMENTIN) 875-125 MG tablet   Other Visit Diagnoses     Screening for osteoporosis       DEXA ordered today.   Relevant Orders   DG Bone Density   Need for vaccination for pneumococcus       Prevnar given today.   Relevant Orders   Pneumococcal conjugate vaccine 20-valent (Prevnar 20)        Follow up plan: Return in about 2 weeks (around 11/23/2022).

## 2022-11-09 NOTE — Assessment & Plan Note (Signed)
Will treat with augmentin and recheck for resolution in about 2 weeks. Call with any concerns.

## 2022-11-09 NOTE — Assessment & Plan Note (Signed)
Rechecking A1c today. Await results. Treat as needed.  

## 2022-11-10 LAB — MICROALBUMIN / CREATININE URINE RATIO
Creatinine, Urine: 98.9 mg/dL
Microalb/Creat Ratio: 56 mg/g creat — ABNORMAL HIGH (ref 0–29)
Microalbumin, Urine: 55.1 ug/mL

## 2022-11-10 LAB — HGB A1C W/O EAG: Hgb A1c MFr Bld: 8.6 % — ABNORMAL HIGH (ref 4.8–5.6)

## 2022-11-15 DIAGNOSIS — M5126 Other intervertebral disc displacement, lumbar region: Secondary | ICD-10-CM | POA: Diagnosis not present

## 2022-11-15 DIAGNOSIS — Z79899 Other long term (current) drug therapy: Secondary | ICD-10-CM | POA: Diagnosis not present

## 2022-11-15 DIAGNOSIS — M5412 Radiculopathy, cervical region: Secondary | ICD-10-CM | POA: Diagnosis not present

## 2022-11-15 DIAGNOSIS — M47812 Spondylosis without myelopathy or radiculopathy, cervical region: Secondary | ICD-10-CM | POA: Diagnosis not present

## 2022-11-15 DIAGNOSIS — M5416 Radiculopathy, lumbar region: Secondary | ICD-10-CM | POA: Diagnosis not present

## 2022-11-25 ENCOUNTER — Ambulatory Visit (INDEPENDENT_AMBULATORY_CARE_PROVIDER_SITE_OTHER): Payer: Medicare Other | Admitting: Family Medicine

## 2022-11-25 ENCOUNTER — Encounter: Payer: Self-pay | Admitting: Family Medicine

## 2022-11-25 VITALS — BP 145/81 | HR 80 | Temp 98.2°F | Ht 60.5 in | Wt 215.1 lb

## 2022-11-25 DIAGNOSIS — H6992 Unspecified Eustachian tube disorder, left ear: Secondary | ICD-10-CM

## 2022-11-25 DIAGNOSIS — F3342 Major depressive disorder, recurrent, in full remission: Secondary | ICD-10-CM

## 2022-11-25 MED ORDER — METHYLPREDNISOLONE 4 MG PO TBPK: ORAL_TABLET | ORAL | 0 refills | Status: DC

## 2022-11-25 MED ORDER — BUPROPION HCL ER (SR) 150 MG PO TB12: ORAL_TABLET | ORAL | 3 refills | Status: DC

## 2022-11-25 MED ORDER — TRIAMCINOLONE ACETONIDE 40 MG/ML IJ SUSP
40.0000 mg | Freq: Once | INTRAMUSCULAR | Status: AC
  Administered 2022-11-25: 40 mg via INTRAMUSCULAR

## 2022-11-25 NOTE — Progress Notes (Signed)
BP (!) 145/81 (BP Location: Right Arm, Cuff Size: Normal)   Pulse 80   Temp 98.2 F (36.8 C) (Oral)   Ht 5' 0.5" (1.537 m)   Wt 215 lb 1.6 oz (97.6 kg)   LMP 03/17/2013 (Approximate)   SpO2 97%   BMI 41.32 kg/m    Subjective:    Patient ID: Sierra Copeland, female    DOB: 18-Mar-1956, 66 y.o.   MRN: [id number]  HPI: Sierra Copeland is a 66 y.o. female  Chief Complaint  Patient presents with   Ear Problem    Patient says she still experiencing the ear congestion and stops up.    EAR PAIN Duration:  about a month Involved ear(s): left Severity:  moderate  Quality:  clogged and popping Fever: no Otorrhea: no Upper respiratory infection symptoms: no Pruritus: no Hearing loss: yes Water immersion no Using Q-tips: no Recurrent otitis media: no Status: better Treatments attempted:  antibiotics  DEPRESSION Mood status: uncontrolled Satisfied with current treatment?: no Symptom severity: moderate  Duration of current treatment : chronic Side effects: no Medication compliance: excellent compliance Psychotherapy/counseling: no  Previous psychiatric medications: lexapro Depressed mood: yes Anxious mood: yes Anhedonia: no Significant weight loss or gain: no Insomnia: no  Fatigue: yes Feelings of worthlessness or guilt: no Impaired concentration/indecisiveness: no Suicidal ideations: no Hopelessness: no Crying spells: no    11/25/2022    3:36 PM 11/09/2022    1:36 PM 09/01/2022    9:07 AM 08/18/2022   10:20 AM 07/26/2022    4:03 PM  Depression screen PHQ 2/9  Decreased Interest 1 0 0 0 0  Down, Depressed, Hopeless 1 0 0 0 0  PHQ - 2 Score 2 0 0 0 0  Altered sleeping 1 0 0 0 0  Tired, decreased energy 1 0 0 0 0  Change in appetite 0 0 0 0 0  Feeling bad or failure about yourself  0 0 0 0 0  Trouble concentrating 0 0 0 0 0  Moving slowly or fidgety/restless 0 0 0 0 0  Suicidal thoughts 0 0 0 0 0  PHQ-9 Score 4 0 0 0 0  Difficult doing work/chores  Not difficult  at all Not difficult at all Not difficult at all Not difficult at all    Relevant past medical, surgical, family and social history reviewed and updated as indicated. Interim medical history since our last visit reviewed. Allergies and medications reviewed and updated.  Review of Systems  Constitutional: Negative.   HENT:  Positive for ear pain. Negative for congestion, dental problem, drooling, ear discharge, facial swelling, hearing loss, mouth sores, nosebleeds, postnasal drip, rhinorrhea, sinus pressure, sinus pain, sneezing, sore throat, tinnitus, trouble swallowing and voice change.   Respiratory: Negative.    Cardiovascular: Negative.   Gastrointestinal: Negative.   Skin: Negative.   Psychiatric/Behavioral: Negative.      Per HPI unless specifically indicated above     Objective:    BP (!) 145/81 (BP Location: Right Arm, Cuff Size: Normal)   Pulse 80   Temp 98.2 F (36.8 C) (Oral)   Ht 5' 0.5" (1.537 m)   Wt 215 lb 1.6 oz (97.6 kg)   LMP 03/17/2013 (Approximate)   SpO2 97%   BMI 41.32 kg/m   Wt Readings from Last 3 Encounters:  11/25/22 215 lb 1.6 oz (97.6 kg)  11/09/22 214 lb 6.4 oz (97.3 kg)  08/18/22 208 lb 14.4 oz (94.8 kg)    Physical Exam Vitals and  nursing note reviewed.  Constitutional:      General: She is not in acute distress.    Appearance: Normal appearance. She is not ill-appearing, toxic-appearing or diaphoretic.  HENT:     Head: Normocephalic and atraumatic.     Right Ear: External ear normal. Tympanic membrane is bulging.     Left Ear: External ear normal. Tympanic membrane is bulging.     Nose: Nose normal.     Mouth/Throat:     Mouth: Mucous membranes are moist.     Pharynx: Oropharynx is clear.  Eyes:     General: No scleral icterus.       Right eye: No discharge.        Left eye: No discharge.     Extraocular Movements: Extraocular movements intact.     Conjunctiva/sclera: Conjunctivae normal.     Pupils: Pupils are equal, round, and  reactive to light.  Cardiovascular:     Rate and Rhythm: Normal rate and regular rhythm.     Pulses: Normal pulses.     Heart sounds: Normal heart sounds. No murmur heard.    No friction rub. No gallop.  Pulmonary:     Effort: Pulmonary effort is normal. No respiratory distress.     Breath sounds: Normal breath sounds. No stridor. No wheezing, rhonchi or rales.  Chest:     Chest wall: No tenderness.  Musculoskeletal:        General: Normal range of motion.     Cervical back: Normal range of motion and neck supple.  Skin:    General: Skin is warm and dry.     Capillary Refill: Capillary refill takes less than 2 seconds.     Coloration: Skin is not jaundiced or pale.     Findings: No bruising, erythema, lesion or rash.  Neurological:     General: No focal deficit present.     Mental Status: She is alert and oriented to person, place, and time. Mental status is at baseline.  Psychiatric:        Mood and Affect: Mood normal.        Behavior: Behavior normal.        Thought Content: Thought content normal.        Judgment: Judgment normal.     Results for orders placed or performed in visit on 11/09/22  Hgb A1c w/o eAG  Result Value Ref Range   Hgb A1c MFr Bld 8.6 (H) 4.8 - 5.6 %  Urine Microalbumin w/creat. ratio  Result Value Ref Range   Creatinine, Urine 98.9 Not Estab. mg/dL   Microalbumin, Urine 55.1 Not Estab. ug/mL   Microalb/Creat Ratio 56 (H) 0 - 29 mg/g creat      Assessment & Plan:   Problem List Items Addressed This Visit       Other   Recurrent major depressive disorder, in full remission ([place])    Not doing well. Will continue lexapro and add wellbutrin. Follow up in February. Call with any concerns.       Relevant Medications   buPROPion (WELLBUTRIN SR) 150 MG 12 hr tablet   Other Visit Diagnoses     Dysfunction of left eustachian tube    -  Primary   Will treat with medrol dose pack. Call if not getting better or getting worse.   Relevant  Medications   triamcinolone acetonide (KENALOG-40) injection 40 mg (Start on 11/25/2022  4:00 PM)        Follow up plan: Return after 01/22/23  for physical.

## 2022-11-25 NOTE — Assessment & Plan Note (Signed)
Not doing well. Will continue lexapro and add wellbutrin. Follow up in February. Call with any concerns.

## 2022-12-23 ENCOUNTER — Encounter: Payer: Self-pay | Admitting: Physician Assistant

## 2022-12-23 ENCOUNTER — Ambulatory Visit (INDEPENDENT_AMBULATORY_CARE_PROVIDER_SITE_OTHER): Payer: Medicare Other | Admitting: Physician Assistant

## 2022-12-23 VITALS — BP 171/83 | HR 76 | Temp 98.8°F | Wt 210.9 lb

## 2022-12-23 DIAGNOSIS — J019 Acute sinusitis, unspecified: Secondary | ICD-10-CM

## 2022-12-23 DIAGNOSIS — B9689 Other specified bacterial agents as the cause of diseases classified elsewhere: Secondary | ICD-10-CM | POA: Diagnosis not present

## 2022-12-23 MED ORDER — AMOXICILLIN-POT CLAVULANATE 875-125 MG PO TABS: 1.0000 | ORAL_TABLET | Freq: Two times a day (BID) | ORAL | 0 refills | Status: AC

## 2022-12-23 NOTE — Progress Notes (Signed)
Acute Office Visit   Patient: Sierra Copeland   DOB: 1956-03-05   67 y.o. Female  MRN: [id number] Visit Date: 12/23/2022  Today's healthcare provider: Dani Gobble Raeshawn Tafolla, PA-C  Introduced myself to the patient as a Journalist, newspaper and provided education on APPs in clinical practice.    Chief Complaint  Patient presents with   URI    Pt states she has been having a cough, congestion, swollen lymph nodes in her neck, and a headache for the last week.    Subjective    URI  Associated symptoms include congestion, coughing, ear pain, headaches, rhinorrhea and sinus pain. Pertinent negatives include no diarrhea, nausea, sore throat, vomiting or wheezing.   HPI     URI    Additional comments: Pt states she has been having a cough, congestion, swollen lymph nodes in her neck, and a headache for the last week.       Last edited by Georgina Peer, CMA on 12/23/2022 11:18 AM.       URI-type symptoms  Onset: gradual  Duration: about a week   Associated symptoms: sinus drainage, swollen lymph nodes along jaw, left ear pain, mild dry cough, headaches, She reports symptoms seem to be lingering but not progressing  Interventions: Tylenol sinus   Recent sick contacts: she reports her husband has also not been feeling well the past few days   COVID testing at home: has not tested for COVID at home  Results: NA   eGFR: 68   Medications: Outpatient Medications Prior to Visit  Medication Sig   acetaminophen (TYLENOL) 650 MG CR tablet Take 650 mg by mouth every 8 (eight) hours as needed for pain.   amLODipine (NORVASC) 2.5 MG tablet Take 1 tablet (2.5 mg total) by mouth daily.   aspirin-acetaminophen-caffeine (EXCEDRIN MIGRAINE) 250-250-65 MG tablet Take 1 tablet by mouth every 6 (six) hours as needed for headache.   buPROPion (WELLBUTRIN SR) 150 MG 12 hr tablet Take 1 pill in the AM for 2 weeks, then increase to 1 pill BID   Cholecalciferol (VITAMIN D3) 2000 units capsule Take 2,000  Units by mouth at bedtime.    Coenzyme Q10 (COQ-10) 100 MG CAPS Take 100 mg by mouth daily.   Continuous Blood Gluc Sensor (FREESTYLE LIBRE 14 DAY SENSOR) MISC CHANGE EVERY 14 DAYS   Dulaglutide (TRULICITY) 1.5 HU/3.1SH SOPN Inject 1.5 mg into the skin once a week.   escitalopram (LEXAPRO) 20 MG tablet Take 1 tablet (20 mg total) by mouth daily.   fexofenadine (ALLEGRA) 180 MG tablet Take 180 mg by mouth daily with breakfast.    fluticasone (FLONASE) 50 MCG/ACT nasal spray Place 1-2 sprays into both nostrils every evening.    gabapentin (NEURONTIN) 100 MG capsule Take 2 capsules (200 mg total) by mouth 2 (two) times daily.   Glucagon (GVOKE HYPOPEN 2-PACK) 1 MG/0.2ML SOAJ Inject 1 each into the skin as needed.   HUMALOG KWIKPEN 100 UNIT/ML KwikPen INJECT 10-20 UNITS INTO THE SKIN WITH BREAKFAST, WITH LUNCH AND WITH EVENING MEAL   Hyoscyamine Sulfate SL 0.125 MG SUBL Place 0.125 mg under the tongue daily as needed (cramping).   insulin glargine, 1 Unit Dial, (TOUJEO SOLOSTAR) 300 UNIT/ML Solostar Pen 80 units at bedtime and 20 units at lunch time   Insulin Syringe-Needle U-100 30G X 5/16" 0.3 ML MISC    lisinopril (ZESTRIL) 20 MG tablet Take 1 tablet (20 mg total) by mouth daily.   Magnesium  250 MG TABS Take 250 mg by mouth daily.   metFORMIN (GLUCOPHAGE) 1000 MG tablet Take 1 tablet (1,000 mg total) by mouth 2 (two) times daily with a meal.   omeprazole (PRILOSEC) 40 MG capsule Take 1 capsule (40 mg total) by mouth daily.   simvastatin (ZOCOR) 40 MG tablet Take 1 tablet (40 mg total) by mouth at bedtime.   tiZANidine (ZANAFLEX) 2 MG tablet Take 2 mg by mouth at bedtime.    traMADol (ULTRAM) 50 MG tablet Take 100 mg by mouth 2 (two) times daily.   ULTRACARE PEN NEEDLES 33G X 4 MM MISC    zinc gluconate 50 MG tablet Take 50 mg by mouth daily.   [DISCONTINUED] amoxicillin-clavulanate (AUGMENTIN) 875-125 MG tablet Take 1 tablet by mouth 2 (two) times daily.   [DISCONTINUED] methylPREDNISolone  (MEDROL DOSEPAK) 4 MG TBPK tablet As directed   Facility-Administered Medications Prior to Visit  Medication Dose Route Frequency Provider   triamcinolone acetonide (KENALOG-40) injection 40 mg  40 mg Intramuscular Once Johnson, Megan P, DO    Review of Systems  Constitutional:  Positive for chills and fatigue. Negative for diaphoresis and fever.  HENT:  Positive for congestion, ear pain, postnasal drip, rhinorrhea, sinus pressure and sinus pain. Negative for sore throat.   Respiratory:  Positive for cough. Negative for chest tightness, shortness of breath and wheezing.   Gastrointestinal:  Negative for diarrhea, nausea and vomiting.  Musculoskeletal:  Negative for myalgias.  Neurological:  Positive for light-headedness and headaches. Negative for dizziness.  Hematological:  Positive for adenopathy.       Objective    BP (!) 171/83 (BP Location: Right Arm, Cuff Size: Normal)   Pulse 76   Temp 98.8 F (37.1 C) (Oral)   Wt 210 lb 14.4 oz (95.7 kg)   LMP 03/17/2013 (Approximate)   SpO2 96%   BMI 40.51 kg/m    Physical Exam Vitals reviewed.  Constitutional:      General: She is awake.     Appearance: Normal appearance. She is well-developed and well-groomed. She is ill-appearing. She is not diaphoretic.  HENT:     Head: Normocephalic and atraumatic.     Right Ear: Hearing, ear canal and external ear normal. A middle ear effusion is present. Tympanic membrane is erythematous. Tympanic membrane is not injected, scarred, perforated, retracted or bulging.     Left Ear: Hearing, ear canal and external ear normal. A middle ear effusion is present. Tympanic membrane is erythematous. Tympanic membrane is not injected, scarred, perforated, retracted or bulging.     Nose: Nose normal.     Mouth/Throat:     Lips: Pink.     Mouth: Mucous membranes are moist.     Pharynx: Oropharynx is clear. No pharyngeal swelling or posterior oropharyngeal erythema.  Eyes:     Extraocular Movements:  Extraocular movements intact.     Conjunctiva/sclera: Conjunctivae normal.  Cardiovascular:     Rate and Rhythm: Normal rate and regular rhythm.     Heart sounds: Normal heart sounds. No murmur heard.    No friction rub. No gallop.  Pulmonary:     Effort: Pulmonary effort is normal.     Breath sounds: Normal breath sounds. No decreased air movement. No decreased breath sounds, wheezing, rhonchi or rales.  Musculoskeletal:     Cervical back: Normal range of motion and neck supple.  Lymphadenopathy:     Head:     Right side of head: No submental, submandibular or preauricular adenopathy.  Left side of head: No submental, submandibular or preauricular adenopathy.     Cervical:     Right cervical: No superficial or posterior cervical adenopathy.    Left cervical: No superficial or posterior cervical adenopathy.     Upper Body:     Right upper body: No supraclavicular adenopathy.     Left upper body: No supraclavicular adenopathy.  Neurological:     General: No focal deficit present.     Mental Status: She is alert and oriented to person, place, and time. Mental status is at baseline.  Psychiatric:        Mood and Affect: Mood normal.        Behavior: Behavior normal. Behavior is cooperative.        Thought Content: Thought content normal.        Judgment: Judgment normal.       No results found for any visits on 12/23/22.  Assessment & Plan      No follow-ups on file.       Problem List Items Addressed This Visit   None Visit Diagnoses     Acute bacterial sinusitis    -  Primary Acute, new concern Reports congestion, rhinorrhea, post nasal drainage, mild cough, and fatigue for about a week that is not improving with OTC/ home measures PE and HPI is consistent with likely acute bacterial sinus infection  Will start Augmentin 875-125 mg PO BID x 7 days  Recommend she continue with OTC symptomatic management for further relief  Return and ED precautions  provided Follow up as needed     Relevant Medications   amoxicillin-clavulanate (AUGMENTIN) 875-125 MG tablet        No follow-ups on file.   I, Emilyn Ruble E Arabelle Bollig, PA-C, have reviewed all documentation for this visit. The documentation on 12/23/22 for the exam, diagnosis, procedures, and orders are all accurate and complete.   Talitha Givens, MHS, PA-C [facility]

## 2022-12-23 NOTE — Patient Instructions (Addendum)
Based on your symptoms and duration of illness, I believe you may have a bacterial sinus infection  These typically resolve with antibiotic therapy along with at-home comfort measures  Today I have sent in a prescription for Augmentin 875-125 mg to be taken by mouth twice per day for 7 days FINISH THE ENTIRE COURSE unless you develop an allergic reaction or are instructed to discontinue.  It can take a few days for the antibiotic to kick in so I recommend symptomatic relief with over the counter medication such as the following: Dayquil/ Nyquil Theraflu Alkaseltzer  Coricidin - if you have high blood pressure even if it is well managed with medications  These medications typically have Tylenol in them already so you can take Ibuprofen as needed for further pain/ discomfort and fever management/ do not need to supplement with more outside of those medications  Stay well hydrated with at least 75 oz of water per day to help with recovery  If you notice any of the following please let us know: increased fever not responding to Tylenol or Ibuprofen, swelling around your nose or eyes, difficulty seeing,    It was nice to meet you and I appreciate the opportunity to be involved in your care If you were satisfied with the care you received from me, I would greatly appreciate you saying so in the after-visit survey that is sent out following our visit.

## 2023-01-17 ENCOUNTER — Ambulatory Visit: Payer: Self-pay | Admitting: *Deleted

## 2023-01-17 ENCOUNTER — Telehealth: Payer: Self-pay | Admitting: Family Medicine

## 2023-01-17 MED ORDER — BUSPIRONE HCL 5 MG PO TABS: 5.0000 mg | ORAL_TABLET | Freq: Two times a day (BID) | ORAL | 1 refills | Status: DC

## 2023-01-17 NOTE — Telephone Encounter (Signed)
  Chief Complaint: Read the message from Dr. Wynetta Emery to her regarding the Buspar rx. Symptoms: for her nerves due to the passing of her husband. Frequency: N/A Pertinent Negatives: Patient denies N/A Disposition: []$ ED /[]$ Urgent Care (no appt availability in office) / []$ Appointment(In office/virtual)/ []$  [facility]/ [x]$ Home Care/ []$ Refused Recommended Disposition /[]$ [facility]/ []$  Follow-up with PCP Additional Notes: Message given to pt from Dr. Wynetta Emery.

## 2023-01-17 NOTE — Telephone Encounter (Signed)
Patient was notified and verbalized understanding. Patient says she is very thankful for the quick response. Advised patient to give our office a call back if she has any questions or concerns.

## 2023-01-17 NOTE — Telephone Encounter (Signed)
Pt called and asked if Dr. Wynetta Emery can prescribe her something for her nerves today / pts husband has passed away / please advise

## 2023-01-17 NOTE — Telephone Encounter (Signed)
Message from Sharene Skeans sent at 01/17/2023  9:46 AM EST  Summary: call for medicaiton due to passing of spouse   Pts husband passed away and she called to ask if Dr. Wynetta Emery can give her something for her nerves due to his passing / please advise          Call History   Type Contact Phone/Fax User  01/17/2023 09:44 AM EST Phone (Incoming) Sierra Copeland, Sierra Copeland (Self) [phone] [name]) [name]   Reason for Disposition  [1] Follow-up call to recent contact AND [2] information only call, no triage required  Answer Assessment - Initial Assessment Questions 1. REASON FOR CALL or QUESTION: "What is your reason for calling today?" or "How can I best help you?" or "What question do you have that I can help answer?"     I returned her call and read her the message from Dr. Wynetta Emery dated 01/17/2023 at 10:44 AM.  No questions from pt.  I let her know we were sorry about the passing of her husband and to call if she needed anything else.   She has an appt. On Monday with Dr. Wynetta Emery so I made sure she had the new temporary address in [place] on [address] and she said she did.  Protocols used: Information Only Call - No Triage-A-AH

## 2023-01-17 NOTE — Addendum Note (Signed)
Addended by: Marnee Guarneri T on: 01/17/2023 10:43 AM   Modules accepted: Orders

## 2023-01-21 DIAGNOSIS — M5432 Sciatica, left side: Secondary | ICD-10-CM | POA: Diagnosis not present

## 2023-01-21 DIAGNOSIS — M9903 Segmental and somatic dysfunction of lumbar region: Secondary | ICD-10-CM | POA: Diagnosis not present

## 2023-01-21 DIAGNOSIS — M9901 Segmental and somatic dysfunction of cervical region: Secondary | ICD-10-CM | POA: Diagnosis not present

## 2023-01-21 DIAGNOSIS — M5412 Radiculopathy, cervical region: Secondary | ICD-10-CM | POA: Diagnosis not present

## 2023-01-24 ENCOUNTER — Encounter: Payer: Self-pay | Admitting: Family Medicine

## 2023-01-24 ENCOUNTER — Ambulatory Visit (INDEPENDENT_AMBULATORY_CARE_PROVIDER_SITE_OTHER): Payer: Medicare Other | Admitting: Family Medicine

## 2023-01-24 VITALS — Temp 98.8°F | Ht 61.75 in | Wt 214.5 lb

## 2023-01-24 DIAGNOSIS — C50912 Malignant neoplasm of unspecified site of left female breast: Secondary | ICD-10-CM

## 2023-01-24 DIAGNOSIS — M059 Rheumatoid arthritis with rheumatoid factor, unspecified: Secondary | ICD-10-CM | POA: Diagnosis not present

## 2023-01-24 DIAGNOSIS — E7800 Pure hypercholesterolemia, unspecified: Secondary | ICD-10-CM | POA: Diagnosis not present

## 2023-01-24 DIAGNOSIS — Z1231 Encounter for screening mammogram for malignant neoplasm of breast: Secondary | ICD-10-CM | POA: Diagnosis not present

## 2023-01-24 DIAGNOSIS — F4321 Adjustment disorder with depressed mood: Secondary | ICD-10-CM

## 2023-01-24 DIAGNOSIS — Z0000 Encounter for general adult medical examination without abnormal findings: Secondary | ICD-10-CM

## 2023-01-24 DIAGNOSIS — I129 Hypertensive chronic kidney disease with stage 1 through stage 4 chronic kidney disease, or unspecified chronic kidney disease: Secondary | ICD-10-CM

## 2023-01-24 DIAGNOSIS — E1169 Type 2 diabetes mellitus with other specified complication: Secondary | ICD-10-CM | POA: Diagnosis not present

## 2023-01-24 DIAGNOSIS — F419 Anxiety disorder, unspecified: Secondary | ICD-10-CM

## 2023-01-24 DIAGNOSIS — F3342 Major depressive disorder, recurrent, in full remission: Secondary | ICD-10-CM

## 2023-01-24 DIAGNOSIS — E669 Obesity, unspecified: Secondary | ICD-10-CM

## 2023-01-24 LAB — URINALYSIS, ROUTINE W REFLEX MICROSCOPIC
Bilirubin, UA: NEGATIVE
Glucose, UA: NEGATIVE
Ketones, UA: NEGATIVE
Nitrite, UA: NEGATIVE
Specific Gravity, UA: 1.015 (ref 1.005–1.030)
Urobilinogen, Ur: 0.2 mg/dL (ref 0.2–1.0)
pH, UA: 5.5 (ref 5.0–7.5)

## 2023-01-24 LAB — MICROSCOPIC EXAMINATION: Bacteria, UA: NONE SEEN

## 2023-01-24 LAB — MICROALBUMIN, URINE WAIVED
Creatinine, Urine Waived: 100 mg/dL (ref 10–300)
Microalb, Ur Waived: 80 mg/L — ABNORMAL HIGH (ref 0–19)

## 2023-01-24 LAB — BAYER DCA HB A1C WAIVED: HB A1C (BAYER DCA - WAIVED): 9.2 % — ABNORMAL HIGH (ref 4.8–5.6)

## 2023-01-24 MED ORDER — GVOKE HYPOPEN 2-PACK 1 MG/0.2ML ~~LOC~~ SOAJ: 1.0000 | SUBCUTANEOUS | 12 refills | Status: AC | PRN

## 2023-01-24 MED ORDER — AMLODIPINE BESYLATE 2.5 MG PO TABS: 2.5000 mg | ORAL_TABLET | Freq: Every day | ORAL | 1 refills | Status: DC

## 2023-01-24 MED ORDER — INSULIN LISPRO (1 UNIT DIAL) 100 UNIT/ML (KWIKPEN): PEN_INJECTOR | SUBCUTANEOUS | 1 refills | Status: DC

## 2023-01-24 MED ORDER — "INSULIN SYRINGE-NEEDLE U-100 30G X 5/16"" 0.3 ML MISC": 1.0000 | Freq: Three times a day (TID) | 12 refills | Status: AC

## 2023-01-24 MED ORDER — ULTRACARE PEN NEEDLES 33G X 4 MM MISC: 1.0000 | Freq: Two times a day (BID) | 12 refills | Status: DC

## 2023-01-24 MED ORDER — TOUJEO SOLOSTAR 300 UNIT/ML ~~LOC~~ SOPN: PEN_INJECTOR | SUBCUTANEOUS | 12 refills | Status: DC

## 2023-01-24 MED ORDER — SIMVASTATIN 40 MG PO TABS: 40.0000 mg | ORAL_TABLET | Freq: Every day | ORAL | 1 refills | Status: DC

## 2023-01-24 MED ORDER — OZEMPIC (0.25 OR 0.5 MG/DOSE) 2 MG/3ML ~~LOC~~ SOPN: 0.2500 mg | PEN_INJECTOR | SUBCUTANEOUS | 1 refills | Status: DC

## 2023-01-24 MED ORDER — OMEPRAZOLE 40 MG PO CPDR: 40.0000 mg | DELAYED_RELEASE_CAPSULE | Freq: Every day | ORAL | 1 refills | Status: DC

## 2023-01-24 MED ORDER — ESCITALOPRAM OXALATE 20 MG PO TABS: 20.0000 mg | ORAL_TABLET | Freq: Every day | ORAL | 1 refills | Status: DC

## 2023-01-24 MED ORDER — GABAPENTIN 100 MG PO CAPS: 200.0000 mg | ORAL_CAPSULE | Freq: Two times a day (BID) | ORAL | 1 refills | Status: DC

## 2023-01-24 MED ORDER — LISINOPRIL 20 MG PO TABS: 20.0000 mg | ORAL_TABLET | Freq: Every day | ORAL | 1 refills | Status: DC

## 2023-01-24 MED ORDER — METFORMIN HCL 1000 MG PO TABS: 1000.0000 mg | ORAL_TABLET | Freq: Two times a day (BID) | ORAL | 1 refills | Status: DC

## 2023-01-24 NOTE — Assessment & Plan Note (Signed)
Continue to follow with oncology. Call with any concerns. Continue to monitor.

## 2023-01-24 NOTE — Assessment & Plan Note (Signed)
Did not start wellbutrin. Acting up due to death of her husband. Continue to monitor. Call with any concerns.

## 2023-01-24 NOTE — Progress Notes (Signed)
Temp 98.8 F (37.1 C) (Oral)   Ht 5' 1.75" (1.568 m)   Wt 214 lb 8 oz (97.3 kg)   LMP 03/17/2013 (Approximate)   SpO2 97%   BMI 39.55 kg/m    Subjective:    Patient ID: Sierra Copeland, female    DOB: 12-01-1955, 67 y.o.   MRN: [id number]  HPI: Sierra Copeland is a 67 y.o. female presenting on 01/24/2023 for comprehensive medical examination. Current medical complaints include:  DIABETES- has not been taking her trulicity due to not having it at the pharmacy Hypoglycemic episodes:yes Polydipsia/polyuria: no Visual disturbance: yes Chest pain: no Paresthesias: no Glucose Monitoring: yes  Accucheck frequency: continuous Taking Insulin?: yes  Long acting insulin: toujeo  Short acting insulin: humalog Blood Pressure Monitoring: rarely Retinal Examination: Not up to Date Foot Exam: Up to Date Diabetic Education: Completed Pneumovax: Up to Date Influenza: Up to Date Aspirin: no  HYPERTENSION / HYPERLIPIDEMIA- has not been taking her amlodipine Satisfied with current treatment? yes Duration of hypertension: chronic BP monitoring frequency: not checking BP medication side effects: no Past BP meds: amlodipine, lisinopril,  Duration of hyperlipidemia: chronic Cholesterol medication side effects: no Cholesterol supplements: none Past cholesterol medications: simvastatin Medication compliance: excellent compliance Aspirin: no Recent stressors: yes Recurrent headaches: no Visual changes: no Palpitations: no Dyspnea: no Chest pain: no Lower extremity edema: no Dizzy/lightheaded: no  ANXIETY/DEPRESSION Duration: chronic Status:exacerbated Anxious mood: yes  Excessive worrying: yes Irritability: no  Sweating: no Nausea: no Palpitations:no Hyperventilation: no Panic attacks: no Agoraphobia: no  Obscessions/compulsions: no Depressed mood: yes    01/24/2023   10:25 AM 11/25/2022    3:36 PM 11/09/2022    1:36 PM 09/01/2022    9:07 AM 08/18/2022   10:20 AM  Depression  screen PHQ 2/9  Decreased Interest 0 1 0 0 0  Down, Depressed, Hopeless 0 1 0 0 0  PHQ - 2 Score 0 2 0 0 0  Altered sleeping 0 1 0 0 0  Tired, decreased energy 0 1 0 0 0  Change in appetite 0 0 0 0 0  Feeling bad or failure about yourself  0 0 0 0 0  Trouble concentrating 0 0 0 0 0  Moving slowly or fidgety/restless 0 0 0 0 0  Suicidal thoughts 0 0 0 0 0  PHQ-9 Score 0 4 0 0 0  Difficult doing work/chores Not difficult at all  Not difficult at all Not difficult at all Not difficult at all      01/24/2023   10:25 AM 11/25/2022    3:36 PM 11/09/2022    1:36 PM 08/18/2022   10:20 AM  GAD 7 : Generalized Anxiety Score  Nervous, Anxious, on Edge 0 1 0 0  Control/stop worrying 0 0 0 0  Worry too much - different things 0 0 0 0  Trouble relaxing 0 0 0 0  Restless 0 0 0 0  Easily annoyed or irritable 0 1 0 0  Afraid - awful might happen 0 0 0 0  Total GAD 7 Score 0 2 0 0  Anxiety Difficulty Not difficult at all Somewhat difficult Somewhat difficult Somewhat difficult   Anhedonia: no Weight changes: no Insomnia: no   Hypersomnia: no Fatigue/loss of energy: yes Feelings of worthlessness: no Feelings of guilt: yes Impaired concentration/indecisiveness: no Suicidal ideations: no  Crying spells: yes Recent Stressors/Life Changes: yes   Relationship problems: yes   Family stress: yes     Financial stress: no  Job stress: no    Recent death/loss: yes  She currently lives with: alone Menopausal Symptoms: no  Depression Screen done today and results listed below:     01/24/2023   10:25 AM 11/25/2022    3:36 PM 11/09/2022    1:36 PM 09/01/2022    9:07 AM 08/18/2022   10:20 AM  Depression screen PHQ 2/9  Decreased Interest 0 1 0 0 0  Down, Depressed, Hopeless 0 1 0 0 0  PHQ - 2 Score 0 2 0 0 0  Altered sleeping 0 1 0 0 0  Tired, decreased energy 0 1 0 0 0  Change in appetite 0 0 0 0 0  Feeling bad or failure about yourself  0 0 0 0 0  Trouble concentrating 0 0 0 0 0   Moving slowly or fidgety/restless 0 0 0 0 0  Suicidal thoughts 0 0 0 0 0  PHQ-9 Score 0 4 0 0 0  Difficult doing work/chores Not difficult at all  Not difficult at all Not difficult at all Not difficult at all    Past Medical History:  Past Medical History:  Diagnosis Date   Abnormal mammogram    Anxiety    Arthritis    Back pain    spinal stenosis and neck bone spur   Cancer ([place])    Breast   Car sickness    Collagenous colitis    Depression    Diabetes mellitus without complication (HCC)    GERD (gastroesophageal reflux disease)    occ-no meds   Hair loss    TAKING THYROTAIN   Headache    migraines   Hyperlipemia    Hypertension    Osteoarthritis    seen by Rheum, not enoug evidence to support RA   Pinched nerve    PONV (postoperative nausea and vomiting)    after tonsillectomy   Rosacea    Seborrheic dermatitis    Sleep apnea    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 Tumor is triple negative. She is dated radiation in August 2018. Continues to have problems with lymphedema   Wears glasses     Surgical History:  Past Surgical History:  Procedure Laterality Date   ACHILLES TENDON REPAIR  2007   right   BREAST LUMPECTOMY  2018   BREAST LUMPECTOMY Left 03/2017   BREAST SURGERY  2011   lt br bx-non cancer   CARPAL TUNNEL RELEASE  1989   right and left   CHOLECYSTECTOMY N/A 09/06/2017   Gallbladder not removed due to complications. Procedure: LAPAROSCOPY;  Surgeon: Leonie Green, MD;  Location: [facility];  Service: General;  Laterality: N/A;   COLONOSCOPY     COLONOSCOPY WITH PROPOFOL N/A 09/24/2021   Procedure: COLONOSCOPY WITH PROPOFOL;  Surgeon: Annamaria Helling, DO;  Location: [facility];  Service: Gastroenterology;  Laterality: N/A;  IDDM   epidural steroid injection x2  2017   ESOPHAGOGASTRODUODENOSCOPY N/A 01/21/2016   Procedure: ESOPHAGOGASTRODUODENOSCOPY (EGD);  Surgeon: Hulen Luster, MD;   Location: [facility];  Service: Gastroenterology;  Laterality: N/A;  Please call at work [phone] Diabetic - insulin   ESOPHAGOGASTRODUODENOSCOPY N/A 09/24/2021   Procedure: ESOPHAGOGASTRODUODENOSCOPY (EGD);  Surgeon: Annamaria Helling, DO;  Location: [facility] END[facility]COPY;  Service: Gastroenterology;  Laterality: N/A;   FINGER EXPLORATION  2013   left long    Lynch's Syndrome     MASTECTOMY Left 2018   Partial  NERVE EXPLORATION Left 10/03/2013   Procedure: NERVE EXPLORATION/ LEFT LONG RADIAL DISTAL NERVE neurolysis;  Surgeon: Schuyler Amor, MD;  Location: [place];  Service: Orthopedics;  Laterality: Left;   SHOULDER ARTHROSCOPY WITH DEBRIDEMENT AND BICEP TENDON REPAIR Right 05/13/2016   Procedure: Arthroscopic debridement, open decompression, rotator cuff repair, tenodesis,right shoulder.;  Surgeon: Corky Mull, MD;  Location: [facility];  Service: Orthopedics;  Laterality: Right;   TONSILLECTOMY  2000   UVULOPALATOPHARYNGOPLASTY  1998   and tonsils-tongue pexi    BREAST BIOPSY Left 03/14/2017       Medications:  Current Outpatient Medications on File Prior to Visit  Medication Sig   acetaminophen (TYLENOL) 650 MG CR tablet Take 650 mg by mouth every 8 (eight) hours as needed for pain.   aspirin-acetaminophen-caffeine (EXCEDRIN MIGRAINE) 250-250-65 MG tablet Take 1 tablet by mouth every 6 (six) hours as needed for headache.   busPIRone (BUSPAR) 5 MG tablet Take 1 tablet (5 mg total) by mouth 2 (two) times daily.   Cholecalciferol (VITAMIN D3) 2000 units capsule Take 2,000 Units by mouth at bedtime.    Coenzyme Q10 (COQ-10) 100 MG CAPS Take 100 mg by mouth daily.   Continuous Blood Gluc Sensor (FREESTYLE LIBRE 14 DAY SENSOR) MISC CHANGE EVERY 14 DAYS   fexofenadine (ALLEGRA) 180 MG tablet Take 180 mg by mouth daily with breakfast.    fluticasone (FLONASE) 50 MCG/ACT nasal spray Place 1-2 sprays into both nostrils every evening.    Hyoscyamine Sulfate SL  0.125 MG SUBL Place 0.125 mg under the tongue daily as needed (cramping).   Magnesium 250 MG TABS Take 250 mg by mouth daily.   tiZANidine (ZANAFLEX) 2 MG tablet Take 2 mg by mouth at bedtime.    traMADol (ULTRAM) 50 MG tablet Take 100 mg by mouth 2 (two) times daily.   zinc gluconate 50 MG tablet Take 50 mg by mouth daily.   Current Facility-Administered Medications on File Prior to Visit  Medication   triamcinolone acetonide (KENALOG-40) injection 40 mg    Allergies:  Allergies  Allergen Reactions   Prednisone Palpitations    Increases BG and HR.    Social History:  Social History   Socioeconomic History   Marital status: Married    Spouse name: Not on file   Number of children: Not on file   Years of education: Not on file   Highest education level: Not on file  Occupational History   Not on file  Tobacco Use   Smoking status: Former    Packs/day: 1.00    Years: 10.00    Total pack years: 10.00    Types: Cigarettes    Quit date: 09/28/1986    Years since quitting: 36.3   Smokeless tobacco: Never  Vaping Use   Vaping Use: Never used  Substance and Sexual Activity   Alcohol use: No   Drug use: No   Sexual activity: Yes  Other Topics Concern   Not on file  Social History Narrative   Not on file   Social Determinants of Health   Financial Resource Strain: Low Risk  (09/01/2022)   Overall Financial Resource Strain (CARDIA)    Difficulty of Paying Living Expenses: Not hard at all  Food Insecurity: No Food Insecurity (09/01/2022)   Hunger Vital Sign    Worried About Running Out of Food in the Last Year: Never true    Ran Out of Food in the Last Year: Never true  Transportation Needs: No Transportation  Needs (09/01/2022)   PRAPARE - Hydrologist (Medical): No    Lack of Transportation (Non-Medical): No  Physical Activity: Inactive (09/01/2022)   Exercise Vital Sign    Days of Exercise per Week: 0 days    Minutes of Exercise per  Session: 0 min  Stress: No Stress Concern Present (09/01/2022)   [place]    Feeling of Stress : Not at all  Social Connections: Moderately Integrated (09/01/2022)   Social Connection and Isolation Panel [NHANES]    Frequency of Communication with Friends and Family: Once a week    Frequency of Social Gatherings with Friends and Family: Twice a week    Attends Religious Services: More than 4 times per year    Active Member of [employer] or Organizations: Not on file    Attends Archivist Meetings: Never    Marital Status: Married  Human resources officer Violence: Not At Risk (09/01/2022)   Humiliation, Afraid, Rape, and Kick questionnaire    Fear of Current or Ex-Partner: No    Emotionally Abused: No    Physically Abused: No    Sexually Abused: No   Social History   Tobacco Use  Smoking Status Former   Packs/day: 1.00   Years: 10.00   Total pack years: 10.00   Types: Cigarettes   Quit date: 09/28/1986   Years since quitting: 36.3  Smokeless Tobacco Never   Social History   Substance and Sexual Activity  Alcohol Use No    Family History:  Family History  Problem Relation Age of Onset   Liver cancer Mother 36       gallbaldder   Hypertension Mother    Hyperlipidemia Mother    Hyperlipidemia Father    Hypertension Father    Diabetes Maternal Aunt    Diabetes Maternal Uncle    Heart disease Maternal Uncle    Breast cancer Paternal Aunt    Diabetes Paternal Aunt    Breast cancer Paternal Aunt    Diabetes Paternal Uncle    Dementia Maternal Grandmother    Stroke Maternal Grandfather    Diabetes Paternal Grandmother    Heart disease Paternal Grandfather    Other Cousin 36       BRCA or similar mutation positive    Past medical history, surgical history, medications, allergies, family history and social history reviewed with patient today and changes made to appropriate areas of the chart.   Review  of Systems  Constitutional: Negative.   HENT:  Positive for ear pain and hearing loss. Negative for congestion, ear discharge, nosebleeds, sinus pain, sore throat and tinnitus.   Eyes: Negative.   Respiratory: Negative.  Negative for stridor.   Cardiovascular:  Positive for palpitations. Negative for chest pain, orthopnea, claudication, leg swelling and PND.  Gastrointestinal:  Positive for diarrhea and heartburn. Negative for abdominal pain, blood in stool, constipation, melena, nausea and vomiting.  Genitourinary: Negative.   Musculoskeletal:  Positive for back pain and joint pain. Negative for falls, myalgias and neck pain.  Skin: Negative.        Yeast infections  Neurological:  Positive for dizziness. Negative for tingling, tremors, sensory change, speech change, focal weakness, seizures, loss of consciousness, weakness and headaches.  Endo/Heme/Allergies:  Positive for environmental allergies. Negative for polydipsia. Does not bruise/bleed easily.  Psychiatric/Behavioral:  Positive for depression. Negative for hallucinations, memory loss, substance abuse and suicidal ideas. The patient is not nervous/anxious and does not  have insomnia.    All other ROS negative except what is listed above and in the HPI.      Objective:    Temp 98.8 F (37.1 C) (Oral)   Ht 5' 1.75" (1.568 m)   Wt 214 lb 8 oz (97.3 kg)   LMP 03/17/2013 (Approximate)   SpO2 97%   BMI 39.55 kg/m   Wt Readings from Last 3 Encounters:  01/24/23 214 lb 8 oz (97.3 kg)  12/23/22 210 lb 14.4 oz (95.7 kg)  11/25/22 215 lb 1.6 oz (97.6 kg)    Physical Exam Vitals and nursing note reviewed.  Constitutional:      General: She is not in acute distress.    Appearance: Normal appearance. She is obese. She is not ill-appearing, toxic-appearing or diaphoretic.  HENT:     Head: Normocephalic and atraumatic.     Right Ear: Tympanic membrane, ear canal and external ear normal. There is no impacted cerumen.     Left Ear:  Tympanic membrane, ear canal and external ear normal. There is no impacted cerumen.     Nose: Nose normal. No congestion or rhinorrhea.     Mouth/Throat:     Mouth: Mucous membranes are moist.     Pharynx: Oropharynx is clear. No oropharyngeal exudate or posterior oropharyngeal erythema.  Eyes:     General: No scleral icterus.       Right eye: No discharge.        Left eye: No discharge.     Extraocular Movements: Extraocular movements intact.     Conjunctiva/sclera: Conjunctivae normal.     Pupils: Pupils are equal, round, and reactive to light.  Neck:     Vascular: No carotid bruit.  Cardiovascular:     Rate and Rhythm: Normal rate and regular rhythm.     Pulses: Normal pulses.     Heart sounds: No murmur heard.    No friction rub. No gallop.  Pulmonary:     Effort: Pulmonary effort is normal. No respiratory distress.     Breath sounds: Normal breath sounds. No stridor. No wheezing, rhonchi or rales.  Chest:     Chest wall: No tenderness.  Abdominal:     General: Abdomen is flat. Bowel sounds are normal. There is no distension.     Palpations: Abdomen is soft. There is no mass.     Tenderness: There is no abdominal tenderness. There is no right CVA tenderness, left CVA tenderness, guarding or rebound.     Hernia: No hernia is present.  Genitourinary:    Comments: Breast and pelvic exams deferred with shared decision making Musculoskeletal:        General: No swelling, tenderness, deformity or signs of injury.     Cervical back: Normal range of motion and neck supple. No rigidity. No muscular tenderness.     Right lower leg: No edema.     Left lower leg: No edema.  Lymphadenopathy:     Cervical: No cervical adenopathy.  Skin:    General: Skin is warm and dry.     Capillary Refill: Capillary refill takes less than 2 seconds.     Coloration: Skin is not jaundiced or pale.     Findings: No bruising, erythema, lesion or rash.  Neurological:     General: No focal deficit  present.     Mental Status: She is alert and oriented to person, place, and time. Mental status is at baseline.     Cranial Nerves: No cranial nerve deficit.  Sensory: No sensory deficit.     Motor: No weakness.     Coordination: Coordination normal.     Gait: Gait normal.     Deep Tendon Reflexes: Reflexes normal.  Psychiatric:        Mood and Affect: Mood normal.        Behavior: Behavior normal.        Thought Content: Thought content normal.        Judgment: Judgment normal.     Results for orders placed or performed in visit on 11/09/22  Hgb A1c w/o eAG  Result Value Ref Range   Hgb A1c MFr Bld 8.6 (H) 4.8 - 5.6 %  Urine Microalbumin w/creat. ratio  Result Value Ref Range   Creatinine, Urine 98.9 Not Estab. mg/dL   Microalbumin, Urine 55.1 Not Estab. ug/mL   Microalb/Creat Ratio 56 (H) 0 - 29 mg/g creat      Assessment & Plan:   Problem List Items Addressed This Visit       Endocrine   Diabetes mellitus type 2 in obese ([place])    Running high with A1c of 9.2- will change from trulicity to ozempic due to supply issues. Start 0.'25mg'$  weekly and recheck 1 month. Cut toujeo to 70units. Continue to monitor. Call with any concerns.       Relevant Medications   Semaglutide,0.25 or 0.'5MG'$ /DOS, (OZEMPIC, 0.25 OR 0.5 MG/DOSE,) 2 MG/3ML SOPN   Glucagon (GVOKE HYPOPEN 2-PACK) 1 MG/0.2ML SOAJ   insulin lispro (HUMALOG KWIKPEN) 100 UNIT/ML KwikPen   insulin glargine, 1 Unit Dial, (TOUJEO SOLOSTAR) 300 UNIT/ML Solostar Pen   lisinopril (ZESTRIL) 20 MG tablet   metFORMIN (GLUCOPHAGE) 1000 MG tablet   simvastatin (ZOCOR) 40 MG tablet   Other Relevant Orders   Bayer DCA Hb A1c Waived   CBC with Differential/Platelet   TSH   Microalbumin, Urine Waived     Musculoskeletal and Integument   Rheumatoid arthritis ([place])    Continue to follow with rheumatology. Call with any concerns. Continue to monitor.       Relevant Orders   Bayer DCA Hb A1c Waived   CBC with  Differential/Platelet     Genitourinary   Benign hypertensive renal disease    Under good control on current regimen. Continue current regimen. Continue to monitor. Call with any concerns. Refills given. Labs drawn today.       Relevant Orders   Bayer DCA Hb A1c Waived   CBC with Differential/Platelet   Urinalysis, Routine w reflex microscopic   TSH   Microalbumin, Urine Waived     Other   Hypercholesteremia    Under good control on current regimen. Continue current regimen. Continue to monitor. Call with any concerns. Refills given. Labs drawn today.      Relevant Medications   amLODipine (NORVASC) 2.5 MG tablet   lisinopril (ZESTRIL) 20 MG tablet   simvastatin (ZOCOR) 40 MG tablet   Other Relevant Orders   Bayer DCA Hb A1c Waived   CBC with Differential/Platelet   Comprehensive metabolic panel   Lipid Panel w/o Chol/HDL Ratio   Invasive ductal carcinoma of breast, female, left (HCC)    Continue to follow with oncology. Call with any concerns. Continue to monitor.       Relevant Orders   Bayer DCA Hb A1c Waived   CBC with Differential/Platelet   Recurrent major depressive disorder, in full remission ([place])    Did not start wellbutrin. Acting up due to death of her husband. Continue to monitor.  Call with any concerns.       Relevant Medications   escitalopram (LEXAPRO) 20 MG tablet   Other Relevant Orders   Bayer DCA Hb A1c Waived   CBC with Differential/Platelet   Acute anxiety    Did not start wellbutrin. Acting up due to death of her husband. Continue to monitor. Call with any concerns.       Relevant Medications   escitalopram (LEXAPRO) 20 MG tablet   Other Visit Diagnoses     Routine general medical examination at a health care facility    -  Primary   Vaccines up to date. Screening labs checked today. Pap, mammo and colonoscopy up to date. DEXA to be done in August. Continue diet & exercise. Call w concerns.   Grief       Lost her husband about a week  ago. Continue to monitor. Declines grief counselor at this time.   Encounter for screening mammogram for malignant neoplasm of breast       Mammogram ordered today.   Relevant Orders   MM 3D SCREEN BREAST BILATERAL        Follow up plan: Return in about 4 weeks (around 02/21/2023).   LABORATORY TESTING:  - Pap smear: up to date  IMMUNIZATIONS:   - Tdap: Tetanus vaccination status reviewed: last tetanus booster within 10 years. - Influenza: Up to date - Pneumovax: Up to date - Prevnar: Up to date - COVID: Refused - HPV: Not applicable - Shingrix vaccine: Given elsewhere  SCREENING: -Mammogram: Up to date  - Colonoscopy: Up to date  - Bone Density: Ordered today   PATIENT COUNSELING:   Advised to take 1 mg of folate supplement per day if capable of pregnancy.   Sexuality: Discussed sexually transmitted diseases, partner selection, use of condoms, avoidance of unintended pregnancy  and contraceptive alternatives.   Advised to avoid cigarette smoking.  I discussed with the patient that most people either abstain from alcohol or drink within safe limits (<=14/week and <=4 drinks/occasion for males, <=7/weeks and <= 3 drinks/occasion for females) and that the risk for alcohol disorders and other health effects rises proportionally with the number of drinks per week and how often a drinker exceeds daily limits.  Discussed cessation/primary prevention of drug use and availability of treatment for abuse.   Diet: Encouraged to adjust caloric intake to maintain  or achieve ideal body weight, to reduce intake of dietary saturated fat and total fat, to limit sodium intake by avoiding high sodium foods and not adding table salt, and to maintain adequate dietary potassium and calcium preferably from fresh fruits, vegetables, and low-fat dairy products.    stressed the importance of regular exercise  Injury prevention: Discussed safety belts, safety helmets, smoke detector, smoking near  bedding or upholstery.   Dental health: Discussed importance of regular tooth brushing, flossing, and dental visits.    NEXT PREVENTATIVE PHYSICAL DUE IN 1 YEAR. Return in about 4 weeks (around 02/21/2023).

## 2023-01-24 NOTE — Assessment & Plan Note (Signed)
Under good control on current regimen. Continue current regimen. Continue to monitor. Call with any concerns. Refills given. Labs drawn today.   

## 2023-01-24 NOTE — Assessment & Plan Note (Signed)
Continue to follow with rheumatology. Call with any concerns. Continue to monitor.

## 2023-01-24 NOTE — Assessment & Plan Note (Signed)
Running high with A1c of 9.2- will change from trulicity to ozempic due to supply issues. Start 0.'25mg'$  weekly and recheck 1 month. Cut toujeo to 70units. Continue to monitor. Call with any concerns.

## 2023-01-25 LAB — CBC WITH DIFFERENTIAL/PLATELET
Basophils Absolute: 0.1 10*3/uL (ref 0.0–0.2)
Basos: 1 %
EOS (ABSOLUTE): 0.3 10*3/uL (ref 0.0–0.4)
Eos: 5 %
Hematocrit: 40.3 % (ref 34.0–46.6)
Hemoglobin: 13.3 g/dL (ref 11.1–15.9)
Immature Grans (Abs): 0 10*3/uL (ref 0.0–0.1)
Immature Granulocytes: 0 %
Lymphocytes Absolute: 1.4 10*3/uL (ref 0.7–3.1)
Lymphs: 27 %
MCH: 28.2 pg (ref 26.6–33.0)
MCHC: 33 g/dL (ref 31.5–35.7)
MCV: 86 fL (ref 79–97)
Monocytes Absolute: 0.4 10*3/uL (ref 0.1–0.9)
Monocytes: 7 %
Neutrophils Absolute: 3 10*3/uL (ref 1.4–7.0)
Neutrophils: 60 %
Platelets: 199 10*3/uL (ref 150–450)
RBC: 4.71 x10E6/uL (ref 3.77–5.28)
RDW: 14 % (ref 11.7–15.4)
WBC: 5.1 10*3/uL (ref 3.4–10.8)

## 2023-01-25 LAB — COMPREHENSIVE METABOLIC PANEL
ALT: 15 IU/L (ref 0–32)
AST: 12 IU/L (ref 0–40)
Albumin/Globulin Ratio: 2 (ref 1.2–2.2)
Albumin: 4 g/dL (ref 3.9–4.9)
Alkaline Phosphatase: 35 IU/L — ABNORMAL LOW (ref 44–121)
BUN/Creatinine Ratio: 17 (ref 12–28)
BUN: 15 mg/dL (ref 8–27)
Bilirubin Total: 0.3 mg/dL (ref 0.0–1.2)
CO2: 24 mmol/L (ref 20–29)
Calcium: 9 mg/dL (ref 8.7–10.3)
Chloride: 103 mmol/L (ref 96–106)
Creatinine, Ser: 0.87 mg/dL (ref 0.57–1.00)
Globulin, Total: 2 g/dL (ref 1.5–4.5)
Glucose: 134 mg/dL — ABNORMAL HIGH (ref 70–99)
Potassium: 4.2 mmol/L (ref 3.5–5.2)
Sodium: 142 mmol/L (ref 134–144)
Total Protein: 6 g/dL (ref 6.0–8.5)
eGFR: 74 mL/min/{1.73_m2} (ref >59)

## 2023-01-25 LAB — LIPID PANEL W/O CHOL/HDL RATIO
Cholesterol, Total: 140 mg/dL (ref 100–199)
HDL: 66 mg/dL (ref >39)
LDL Chol Calc (NIH): 54 mg/dL (ref 0–99)
Triglycerides: 110 mg/dL (ref 0–149)
VLDL Cholesterol Cal: 20 mg/dL (ref 5–40)

## 2023-01-25 LAB — TSH: TSH: 0.766 u[IU]/mL (ref 0.450–4.500)

## 2023-01-28 ENCOUNTER — Other Ambulatory Visit: Payer: Self-pay | Admitting: Family Medicine

## 2023-01-28 ENCOUNTER — Ambulatory Visit: Payer: Self-pay | Admitting: *Deleted

## 2023-01-28 DIAGNOSIS — M9903 Segmental and somatic dysfunction of lumbar region: Secondary | ICD-10-CM | POA: Diagnosis not present

## 2023-01-28 DIAGNOSIS — M5432 Sciatica, left side: Secondary | ICD-10-CM | POA: Diagnosis not present

## 2023-01-28 DIAGNOSIS — M9901 Segmental and somatic dysfunction of cervical region: Secondary | ICD-10-CM | POA: Diagnosis not present

## 2023-01-28 DIAGNOSIS — M5412 Radiculopathy, cervical region: Secondary | ICD-10-CM | POA: Diagnosis not present

## 2023-01-28 MED ORDER — NITROFURANTOIN MONOHYD MACRO 100 MG PO CAPS: 100.0000 mg | ORAL_CAPSULE | Freq: Two times a day (BID) | ORAL | 0 refills | Status: DC

## 2023-01-28 NOTE — Telephone Encounter (Signed)
  Chief Complaint: Abd pain over bladder area, Thinks she has a UTI. Symptoms: Lower abd pain, dark urine, feeling like she needs to go frequently but does very little when she urinates. Frequency: Since middle of the week Pertinent Negatives: Patient denies blood in urine. Disposition: '[]'$ ED /'[]'$ Urgent Care (no appt availability in office) / '[]'$ Appointment(In office/virtual)/ '[]'$  [facility]/ '[]'$ Home Care/ '[]'$ Refused Recommended Disposition /'[]'$ [facility]/ '[x]'$  Follow-up with PCP Additional Notes: Pt was seen for her physical with Dr. Wynetta Emery on 01/24/2023.   Her urine showed white blood cells in it but since she was not having symptoms at that time nothing was done. She is not able to come in today due to a conflict.    Since she was just in would Dr. Wynetta Emery be willing to call in something for her?   If not she can come in on Monday.   Call to [facility], if Dr. Wynetta Emery agreeable to calling in an antibiotic for her.

## 2023-01-28 NOTE — Telephone Encounter (Signed)
Patient was notified and made aware of Rx being sent to her local pharmacy. Patient verbalized understanding.

## 2023-01-28 NOTE — Telephone Encounter (Signed)
Rx sent to her pharmacy 

## 2023-01-28 NOTE — Telephone Encounter (Signed)
Message from Sharene Skeans sent at 01/28/2023 10:14 AM EST  Summary: UTI   Pt stated she has a UTI / she declined appt for 3:20 this afternoon due to having another appt / please advise          Call History   Type Contact Phone/Fax User  01/28/2023 10:13 AM EST Phone (Incoming) Sierra, Copeland (Self) [phone] Jerilynn Mages) [name]   Reason for Disposition  Age > 51 years    Pt unable to come in today.    She was in this week and had physical.   Her urine showed white blood cells but she wasn't having symptoms.  Answer Assessment - Initial Assessment Questions 1. SEVERITY: "How bad is the pain?"  (e.g., Scale 1-10; mild, moderate, or severe)   - MILD (1-3): complains slightly about urination hurting   - MODERATE (4-7): interferes with normal activities     - SEVERE (8-10): excruciating, unwilling or unable to urinate because of the pain      Abd pain in lower abd. Over my bladder area.   I feel like I need to go but then I don't pee very much. 2. FREQUENCY: "How many times have you had painful urination today?"      Day before I was going frequently but not today. 3. PATTERN: "Is pain present every time you urinate or just sometimes?"      Not burning with urination.   My urine is darker than usual.   No blood noted 4. ONSET: "When did the painful urination start?"      This week.    My urine showed white blood cells during my physical with Dr Wynetta Emery but I wasn't hurting then which was earlier this week. 5. FEVER: "Do you have a fever?" If Yes, ask: "What is your temperature, how was it measured, and when did it start?"     Not asked 6. PAST UTI: "Have you had a urine infection before?" If Yes, ask: "When was the last time?" and "What happened that time?"      I have had UTIs but don't get them on a regular basis and it's been a long time since I had one. 7. CAUSE: "What do you think is causing the painful urination?"  (e.g., UTI, scratch, Herpes sore)     UTI 8. OTHER SYMPTOMS:  "Do you have any other symptoms?" (e.g., blood in urine, flank pain, genital sores, urgency, vaginal discharge)     No blood, no flank pain just the lower abd pain over bladder area 9. PREGNANCY: "Is there any chance you are pregnant?" "When was your last menstrual period?"     N/A due to age  Protocols used: Urination Pain - Female-A-AH

## 2023-01-31 DIAGNOSIS — M9901 Segmental and somatic dysfunction of cervical region: Secondary | ICD-10-CM | POA: Diagnosis not present

## 2023-01-31 DIAGNOSIS — M5412 Radiculopathy, cervical region: Secondary | ICD-10-CM | POA: Diagnosis not present

## 2023-01-31 DIAGNOSIS — M9903 Segmental and somatic dysfunction of lumbar region: Secondary | ICD-10-CM | POA: Diagnosis not present

## 2023-01-31 DIAGNOSIS — M5432 Sciatica, left side: Secondary | ICD-10-CM | POA: Diagnosis not present

## 2023-02-03 ENCOUNTER — Inpatient Hospital Stay: Payer: Medicare Other | Attending: Oncology | Admitting: Oncology

## 2023-02-03 ENCOUNTER — Encounter: Payer: Self-pay | Admitting: Oncology

## 2023-02-03 VITALS — BP 185/84 | HR 80 | Temp 98.9°F | Resp 18 | Ht 61.75 in | Wt 213.0 lb

## 2023-02-03 DIAGNOSIS — Z171 Estrogen receptor negative status [ER-]: Secondary | ICD-10-CM | POA: Diagnosis not present

## 2023-02-03 DIAGNOSIS — Z87891 Personal history of nicotine dependence: Secondary | ICD-10-CM | POA: Diagnosis not present

## 2023-02-03 DIAGNOSIS — C50412 Malignant neoplasm of upper-outer quadrant of left female breast: Secondary | ICD-10-CM | POA: Insufficient documentation

## 2023-02-03 DIAGNOSIS — Z803 Family history of malignant neoplasm of breast: Secondary | ICD-10-CM | POA: Diagnosis not present

## 2023-02-03 DIAGNOSIS — Z923 Personal history of irradiation: Secondary | ICD-10-CM | POA: Insufficient documentation

## 2023-02-03 DIAGNOSIS — Z800 Family history of malignant neoplasm of digestive organs: Secondary | ICD-10-CM | POA: Diagnosis not present

## 2023-02-03 DIAGNOSIS — C50912 Malignant neoplasm of unspecified site of left female breast: Secondary | ICD-10-CM

## 2023-02-03 DIAGNOSIS — E119 Type 2 diabetes mellitus without complications: Secondary | ICD-10-CM | POA: Insufficient documentation

## 2023-02-03 DIAGNOSIS — I10 Essential (primary) hypertension: Secondary | ICD-10-CM | POA: Diagnosis not present

## 2023-02-03 DIAGNOSIS — Z1509 Genetic susceptibility to other malignant neoplasm: Secondary | ICD-10-CM | POA: Diagnosis not present

## 2023-02-03 DIAGNOSIS — Z1231 Encounter for screening mammogram for malignant neoplasm of breast: Secondary | ICD-10-CM

## 2023-02-03 NOTE — Progress Notes (Signed)
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  She is currently grieving secondary to the recent death of her husband, but otherwise is felt well.  She has no neurologic complaints.  She denies any recent fevers or illnesses.  She has a good appetite and denies weight loss.  She has no chest pain, shortness of breath, cough, or hemoptysis.  She denies any nausea, vomiting, constipation, diarrhea.  She has no urinary complaints.  Patient offers no specific complaints today.  REVIEW OF SYSTEMS:   Review of Systems  Constitutional: Negative.  Negative for fever, malaise/fatigue and weight loss.  Respiratory: Negative.  Negative for cough, hemoptysis and shortness of breath.   Cardiovascular: Negative.  Negative for chest pain and leg swelling.  Gastrointestinal: Negative.  Negative for abdominal pain.  Genitourinary: Negative.  Negative for dysuria.  Musculoskeletal: Negative.  Negative for back pain.  Skin: Negative.  Negative for rash.  Neurological: Negative.  Negative for dizziness, focal weakness, weakness and headaches.  Psychiatric/Behavioral: Negative.  The patient is not nervous/anxious.     As per HPI. Otherwise, a complete review of systems is negative.  PAST MEDICAL HISTORY: Past Medical History:  Diagnosis Date   Abnormal mammogram    Anxiety    Arthritis    Back pain    spinal stenosis and neck bone spur   Cancer (HCC)    Breast   Car sickness    Collagenous colitis    Depression    Diabetes mellitus without  complication (HCC)    GERD (gastroesophageal reflux disease)    occ-no meds   Hair loss    TAKING THYROTAIN   Headache    migraines   Hyperlipemia    Hypertension    Osteoarthritis    seen by Rheum, not enoug evidence to support RA   Pinched nerve    PONV (postoperative nausea and vomiting)    after tonsillectomy   Rosacea    Seborrheic dermatitis    Sleep apnea    Triple negative breast cancer ([place])    -history of a stage I left breast cancer. Tumor is triple negative. She is dated radiation in August 2018. Continues to have problems with lymphedema   Wears glasses     PAST SURGICAL HISTORY: Past Surgical History:  Procedure Laterality Date   ACHILLES TENDON REPAIR  2007   right   BREAST LUMPECTOMY  2018   BREAST LUMPECTOMY Left 03/2017   BREAST SURGERY  2011   lt br bx-non cancer   CARPAL TUNNEL RELEASE  1989   right and left   CHOLECYSTECTOMY N/A 09/06/2017   Gallbladder not removed due to complications. Procedure: LAPAROSCOPY;  Surgeon: Leonie Green, MD;  Location: [facility];  Service: General;  Laterality: N/A;   COLONOSCOPY     COLONOSCOPY WITH PROPOFOL N/A 09/24/2021   Procedure: COLONOSCOPY WITH PROPOFOL;  Surgeon: Annamaria Helling, DO;  Location: [facility];  Service: Gastroenterology;  Laterality: N/A;  IDDM   epidural steroid injection x2  2017   ESOPHAGOGASTRODUODENOSCOPY N/A 01/21/2016   Procedure: ESOPHAGOGASTRODUODENOSCOPY (EGD);  Surgeon: Lupita Dawn  Candace Cruise, MD;  Location: [place];  Service: Gastroenterology;  Laterality: N/A;  Please call at work [phone] Diabetic - insulin   ESOPHAGOGASTRODUODENOSCOPY N/A 09/24/2021   Procedure: ESOPHAGOGASTRODUODENOSCOPY (EGD);  Surgeon: Annamaria Helling, DO;  Location: [facility];  Service: Gastroenterology;  Laterality: N/A;   FINGER EXPLORATION  2013   left long    Lynch's Syndrome     MASTECTOMY Left 2018   Partial   NERVE EXPLORATION Left 10/03/2013   Procedure: NERVE  EXPLORATION/ LEFT LONG RADIAL DISTAL NERVE neurolysis;  Surgeon: Schuyler Amor, MD;  Location: [place];  Service: Orthopedics;  Laterality: Left;   SHOULDER ARTHROSCOPY WITH DEBRIDEMENT AND BICEP TENDON REPAIR Right 05/13/2016   Procedure: Arthroscopic debridement, open decompression, rotator cuff repair, tenodesis,right shoulder.;  Surgeon: Corky Mull, MD;  Location: [facility];  Service: Orthopedics;  Laterality: Right;   TONSILLECTOMY  2000   UVULOPALATOPHARYNGOPLASTY  1998   and tonsils-tongue pexi    BREAST BIOPSY Left 03/14/2017       FAMILY HISTORY: Family History  Problem Relation Age of Onset   Liver cancer Mother 84       gallbaldder   Hypertension Mother    Hyperlipidemia Mother    Hyperlipidemia Father    Hypertension Father    Diabetes Maternal Aunt    Diabetes Maternal Uncle    Heart disease Maternal Uncle    Breast cancer Paternal Aunt    Diabetes Paternal Aunt    Breast cancer Paternal Aunt    Diabetes Paternal Uncle    Dementia Maternal Grandmother    Stroke Maternal Grandfather    Diabetes Paternal Grandmother    Heart disease Paternal Grandfather    Other Cousin 66       BRCA or similar mutation positive    ADVANCED DIRECTIVES (Y/N):  N  HEALTH MAINTENANCE: Social History   Tobacco Use   Smoking status: Former    Packs/day: 1.00    Years: 10.00    Total pack years: 10.00    Types: Cigarettes    Quit date: 09/28/1986    Years since quitting: 36.3   Smokeless tobacco: Never  Vaping Use   Vaping Use: Never used  Substance Use Topics   Alcohol use: No   Drug use: No     Colonoscopy:  PAP:  Bone density:  Lipid panel:  Allergies  Allergen Reactions   Prednisone Palpitations    Increases BG and HR.    Current Outpatient Medications  Medication Sig Dispense Refill   acetaminophen (TYLENOL) 650 MG CR tablet Take 650 mg by mouth every 8 (eight) hours as needed for pain.     amLODipine (NORVASC) 2.5 MG tablet Take 1  tablet (2.5 mg total) by mouth daily. 90 tablet 1   aspirin-acetaminophen-caffeine (EXCEDRIN MIGRAINE) 250-250-65 MG tablet Take 1 tablet by mouth every 6 (six) hours as needed for headache.     busPIRone (BUSPAR) 5 MG tablet Take 1 tablet (5 mg total) by mouth 2 (two) times daily. 60 tablet 1   Cholecalciferol (VITAMIN D3) 2000 units capsule Take 2,000 Units by mouth at bedtime.      Coenzyme Q10 (COQ-10) 100 MG CAPS Take 100 mg by mouth daily.     Continuous Blood Gluc Sensor (FREESTYLE LIBRE 14 DAY SENSOR) MISC CHANGE EVERY 14 DAYS 12 each 3   escitalopram (LEXAPRO) 20 MG tablet Take 1 tablet (20 mg total) by mouth daily. 90 tablet 1   fexofenadine (ALLEGRA) 180 MG tablet Take 180  mg by mouth daily with breakfast.      fluticasone (FLONASE) 50 MCG/ACT nasal spray Place 1-2 sprays into both nostrils every evening.      gabapentin (NEURONTIN) 100 MG capsule Take 2 capsules (200 mg total) by mouth 2 (two) times daily. 360 capsule 1   Glucagon (GVOKE HYPOPEN 2-PACK) 1 MG/0.2ML SOAJ Inject 1 each into the skin as needed. 0.2 mL 12   Hyoscyamine Sulfate SL 0.125 MG SUBL Place 0.125 mg under the tongue daily as needed (cramping). 120 tablet 3   insulin glargine, 1 Unit Dial, (TOUJEO SOLOSTAR) 300 UNIT/ML Solostar Pen 70 units at bedtime and 20 units at lunch time 10 mL 12   insulin lispro (HUMALOG KWIKPEN) 100 UNIT/ML KwikPen INJECT 10-20 UNITS INTO THE SKIN WITH BREAKFAST, WITH LUNCH AND WITH EVENING MEAL 54 mL 1   Insulin Syringe-Needle U-100 30G X 5/16" 0.3 ML MISC Inject 1 each into the skin 3 (three) times daily. 500 each 12   lisinopril (ZESTRIL) 20 MG tablet Take 1 tablet (20 mg total) by mouth daily. 90 tablet 1   Magnesium 250 MG TABS Take 250 mg by mouth daily.     metFORMIN (GLUCOPHAGE) 1000 MG tablet Take 1 tablet (1,000 mg total) by mouth 2 (two) times daily with a meal. 180 tablet 1   nitrofurantoin, macrocrystal-monohydrate, (MACROBID) 100 MG capsule Take 1 capsule (100 mg total) by  mouth 2 (two) times daily. 14 capsule 0   omeprazole (PRILOSEC) 40 MG capsule Take 1 capsule (40 mg total) by mouth daily. 90 capsule 1   Semaglutide,0.25 or 0.'5MG'$ /DOS, (OZEMPIC, 0.25 OR 0.5 MG/DOSE,) 2 MG/3ML SOPN Inject 0.25 mg into the skin once a week. 3 mL 1   simvastatin (ZOCOR) 40 MG tablet Take 1 tablet (40 mg total) by mouth at bedtime. 90 tablet 1   tiZANidine (ZANAFLEX) 2 MG tablet Take 2 mg by mouth at bedtime.      traMADol (ULTRAM) 50 MG tablet Take 100 mg by mouth 2 (two) times daily.     ULTRACARE PEN NEEDLES 33G X 4 MM MISC Inject 1 each into the skin 2 (two) times daily. 100 each 12   zinc gluconate 50 MG tablet Take 50 mg by mouth daily.     Current Facility-Administered Medications  Medication Dose Route Frequency Provider Last Rate Last Admin   triamcinolone acetonide (KENALOG-40) injection 40 mg  40 mg Intramuscular Once Johnson, Megan P, DO        OBJECTIVE: Vitals:   02/03/23 1100  BP: (!) 185/84  Pulse: 80  Resp: 18  Temp: 98.9 F (37.2 C)  SpO2: 97%     Body mass index is 39.27 kg/m.    ECOG FS:0 - Asymptomatic  General: Well-developed, well-nourished, no acute distress. Eyes: Pink conjunctiva, anicteric sclera. HEENT: Normocephalic, moist mucous membranes. Breast: Exam deferred today. Lungs: No audible wheezing or coughing. Heart: Regular rate and rhythm. Abdomen: Soft, nontender, no obvious distention. Musculoskeletal: No edema, cyanosis, or clubbing. Neuro: Alert, answering all questions appropriately. Cranial nerves grossly intact. Skin: No rashes or petechiae noted. Psych: Normal affect.  LAB RESULTS:  Lab Results  Component Value Date   NA 142 01/24/2023   K 4.2 01/24/2023   CL 103 01/24/2023   CO2 24 01/24/2023   GLUCOSE 134 (H) 01/24/2023   BUN 15 01/24/2023   CREATININE 0.87 01/24/2023   CALCIUM 9.0 01/24/2023   PROT 6.0 01/24/2023   ALBUMIN 4.0 01/24/2023   AST 12 01/24/2023   ALT 15  01/24/2023   ALKPHOS 35 (L) 01/24/2023    BILITOT 0.3 01/24/2023   GFRNONAA >60 02/19/2022   GFRAA 70 12/01/2020    Lab Results  Component Value Date   WBC 5.1 01/24/2023   NEUTROABS 3.0 01/24/2023   HGB 13.3 01/24/2023   HCT 40.3 01/24/2023   MCV 86 01/24/2023   PLT 199 01/24/2023     STUDIES: No results found.  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  Patient reports chemotherapy was discussed, but she ultimately declined treatment.  She did not require an aromatase inhibitor.  Her most recent mammogram on July 06, 2022 was reported as BI-RADS 1.  Repeat in August 2024.  Return to clinic in 1 year for routine evaluation. 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  Continue follow-up with GI with colonoscopies as recommended. Bone health: Will get bone mineral density along with mammogram as above.   Patient expressed understanding and was in agreement with this plan. She also understands that She can call clinic at any time with any questions, concerns, or complaints.    Cancer Staging  Invasive ductal carcinoma of breast, female, left [facility]) Staging form: Breast, AJCC 8th Edition - Pathologic stage from 02/02/2022: Stage IB (pT1c, pN0, cM0, G3, ER-, PR-, HER2-) - Signed by Lloyd Huger, MD on 02/02/2022 Stage prefix: Initial diagnosis Histologic grading system: 3 grade system   Lloyd Huger, MD   02/03/2023 11:35 AM

## 2023-02-08 DIAGNOSIS — H2513 Age-related nuclear cataract, bilateral: Secondary | ICD-10-CM | POA: Diagnosis not present

## 2023-02-08 DIAGNOSIS — E113293 Type 2 diabetes mellitus with mild nonproliferative diabetic retinopathy without macular edema, bilateral: Secondary | ICD-10-CM | POA: Diagnosis not present

## 2023-02-08 LAB — HM DIABETES EYE EXAM

## 2023-02-15 DIAGNOSIS — M5412 Radiculopathy, cervical region: Secondary | ICD-10-CM | POA: Diagnosis not present

## 2023-02-15 DIAGNOSIS — M9901 Segmental and somatic dysfunction of cervical region: Secondary | ICD-10-CM | POA: Diagnosis not present

## 2023-02-15 DIAGNOSIS — M5432 Sciatica, left side: Secondary | ICD-10-CM | POA: Diagnosis not present

## 2023-02-15 DIAGNOSIS — M9903 Segmental and somatic dysfunction of lumbar region: Secondary | ICD-10-CM | POA: Diagnosis not present

## 2023-02-17 ENCOUNTER — Encounter: Payer: Self-pay | Admitting: Licensed Clinical Social Worker

## 2023-02-21 ENCOUNTER — Ambulatory Visit: Payer: Medicare Other | Admitting: Family Medicine

## 2023-02-23 ENCOUNTER — Encounter: Payer: Self-pay | Admitting: Family Medicine

## 2023-02-23 ENCOUNTER — Telehealth: Payer: Self-pay

## 2023-02-23 ENCOUNTER — Telehealth (INDEPENDENT_AMBULATORY_CARE_PROVIDER_SITE_OTHER): Payer: Medicare Other | Admitting: Family Medicine

## 2023-02-23 DIAGNOSIS — E1165 Type 2 diabetes mellitus with hyperglycemia: Secondary | ICD-10-CM

## 2023-02-23 DIAGNOSIS — E1169 Type 2 diabetes mellitus with other specified complication: Secondary | ICD-10-CM

## 2023-02-23 DIAGNOSIS — R1319 Other dysphagia: Secondary | ICD-10-CM

## 2023-02-23 DIAGNOSIS — Z794 Long term (current) use of insulin: Secondary | ICD-10-CM | POA: Diagnosis not present

## 2023-02-23 MED ORDER — TOUJEO SOLOSTAR 300 UNIT/ML ~~LOC~~ SOPN: PEN_INJECTOR | SUBCUTANEOUS | 12 refills | Status: DC

## 2023-02-23 MED ORDER — FREESTYLE LIBRE 3 SENSOR MISC: 1.0000 | Freq: Every day | 0 refills | Status: DC

## 2023-02-23 MED ORDER — FREESTYLE LIBRE 3 READER DEVI: 1.0000 | Freq: Every day | 0 refills | Status: DC

## 2023-02-23 MED ORDER — OMEPRAZOLE 40 MG PO CPDR: 40.0000 mg | DELAYED_RELEASE_CAPSULE | Freq: Two times a day (BID) | ORAL | 1 refills | Status: DC

## 2023-02-23 NOTE — Progress Notes (Signed)
Pt was called and schedule for 03/24/2023 at 1:20

## 2023-02-23 NOTE — Telephone Encounter (Signed)
-----   Message from Valerie Roys, DO sent at 02/23/2023 11:31 AM EDT ----- Needs new rx for frestyle libre to [facility]

## 2023-02-23 NOTE — Progress Notes (Signed)
LMP 03/17/2013 (Approximate)    Subjective:    Patient ID: Sierra Copeland, female    DOB: Apr 14, 1956, 67 y.o.   MRN: [id number]  HPI: Sierra Copeland is a 67 y.o. female  Chief Complaint  Patient presents with   Diabetes    Patient says her food is not breaking down when she eats and feels as if her food isn't going down and she thinks it is related to the [place].    Medication Consultation    Patient is requesting a DexCom 7 or Libre, but would like to discuss with provider at today's visit.    DYSPHAGIA Duration: chronic, worse in the past month Description of symptom: has a spasm in her chest  Onset: Immediately upon swallowing Location of dysphagia: chest Dysphagia to solids only: no Dysphagia to solids & liquids: yes  Frequency: 1x a week   Progressively getting worse: yes Alleviatiating factors: nothing Provoking factors: unknown Status: worse EGD: yes Weight loss: no Sensation of lump in throat: no Heartburn: no Odynophagia: no Nausea: yes Vomiting: no Drooling/nasal regurgitation/food spillage: no Coughing/choking/dysphonia: no Dysarthria: no Hematemesis: no Regurgitation of undigested food/halitosis: no Chest pain: no  DIABETES- feels like she's having some spasms in her chest unsure what her  Hypoglycemic episodes:yes Polydipsia/polyuria: no Visual disturbance: no Chest pain: no Paresthesias: no Glucose Monitoring: yes  Accucheck frequency: continuous  Fasting glucose: 90 Taking Insulin?: yes Blood Pressure Monitoring: not checking Retinal Examination: Up to Date Foot Exam: Up to Date Diabetic Education: Completed Pneumovax: Up to Date Influenza: Up to Date Aspirin: no  Relevant past medical, surgical, family and social history reviewed and updated as indicated. Interim medical history since our last visit reviewed. Allergies and medications reviewed and updated.  Review of Systems  Constitutional: Negative.   Respiratory: Negative.     Cardiovascular: Negative.   Gastrointestinal:  Positive for nausea. Negative for abdominal distention, abdominal pain, anal bleeding, blood in stool, constipation, diarrhea, rectal pain and vomiting.  Musculoskeletal: Negative.   Neurological: Negative.   Psychiatric/Behavioral: Negative.      Per HPI unless specifically indicated above     Objective:    LMP 03/17/2013 (Approximate)   Wt Readings from Last 3 Encounters:  02/03/23 213 lb (96.6 kg)  01/24/23 214 lb 8 oz (97.3 kg)  12/23/22 210 lb 14.4 oz (95.7 kg)    Physical Exam Vitals and nursing note reviewed.  Constitutional:      General: She is not in acute distress.    Appearance: Normal appearance. She is not ill-appearing, toxic-appearing or diaphoretic.  HENT:     Head: Normocephalic and atraumatic.     Right Ear: External ear normal.     Left Ear: External ear normal.     Nose: Nose normal.     Mouth/Throat:     Mouth: Mucous membranes are moist.     Pharynx: Oropharynx is clear.  Eyes:     General: No scleral icterus.       Right eye: No discharge.        Left eye: No discharge.     Conjunctiva/sclera: Conjunctivae normal.     Pupils: Pupils are equal, round, and reactive to light.  Pulmonary:     Effort: Pulmonary effort is normal. No respiratory distress.     Comments: Speaking in full sentences Musculoskeletal:        General: Normal range of motion.     Cervical back: Normal range of motion.  Skin:    Coloration:  Skin is not jaundiced or pale.     Findings: No bruising, erythema, lesion or rash.  Neurological:     Mental Status: She is alert and oriented to person, place, and time. Mental status is at baseline.  Psychiatric:        Mood and Affect: Mood normal.        Behavior: Behavior normal.        Thought Content: Thought content normal.        Judgment: Judgment normal.     Results for orders placed or performed in visit on 02/11/23  HM DIABETES EYE EXAM  Result Value Ref Range   HM  Diabetic Eye Exam No Retinopathy No Retinopathy      Assessment & Plan:   Problem List Items Addressed This Visit       Endocrine   Type 2 diabetes mellitus with hyperglycemia ([place]) - Primary    Sugars going low. Continue current dose of ozempic and cut her insulin to 60 units at night. Recheck 2 weeks by mychart- goal of titrating up ozempic and cutting down on insulin. Continue to monitor.       Relevant Medications   insulin glargine, 1 Unit Dial, (TOUJEO SOLOSTAR) 300 UNIT/ML Solostar Pen   Other Visit Diagnoses     Esophageal dysphagia       Will change her omeprazole to BID. If not improving in 2 weeks, will get her back into GI. Call with any concerns.        Follow up plan: Return in about 4 weeks (around 03/23/2023).   This visit was completed via video visit through MyChart due to the restrictions of the COVID-19 pandemic. All issues as above were discussed and addressed. Physical exam was done as above through visual confirmation on video through MyChart. If it was felt that the patient should be evaluated in the office, they were directed there. The patient verbally consented to this visit. Location of the patient: home Location of the provider: work Those involved with this call:  Provider: Park Liter, DO CMA: Irena Reichmann, [place] Desk/Registration: Leota Jacobsen  Time spent on call:  25 minutes with patient face to face via video conference. More than 50% of this time was spent in counseling and coordination of care. 40 minutes total spent in review of patient's record and preparation of their chart.

## 2023-02-23 NOTE — Assessment & Plan Note (Signed)
Sugars going low. Continue current dose of ozempic and cut her insulin to 60 units at night. Recheck 2 weeks by mychart- goal of titrating up ozempic and cutting down on insulin. Continue to monitor.

## 2023-03-01 ENCOUNTER — Ambulatory Visit: Payer: Self-pay | Admitting: Licensed Clinical Social Worker

## 2023-03-01 DIAGNOSIS — Z1589 Genetic susceptibility to other disease: Secondary | ICD-10-CM

## 2023-03-01 DIAGNOSIS — Z1379 Encounter for other screening for genetic and chromosomal anomalies: Secondary | ICD-10-CM

## 2023-03-01 NOTE — Progress Notes (Signed)
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 Please refer to our prior cancer genetics clinic note for more information regarding our discussion, assessment and recommendations, at the time. Sierra Copeland recent genetic test results were disclosed to her, as were recommendations warranted by these results. These results and recommendations are discussed in more detail below.  CANCER HISTORY:  Oncology History  Invasive ductal carcinoma of breast, female, left  01/31/2018 Initial Diagnosis   Invasive ductal carcinoma of breast, female, left ([place])   02/02/2022 Cancer Staging   Staging form: Breast, AJCC 8th Edition - Pathologic stage from 02/02/2022: Stage IB (pT1c, pN0, cM0, G3, ER-, PR-, HER2-) - Signed by Lloyd Huger, MD on 02/02/2022 Stage prefix: Initial diagnosis Histologic grading system: 3 grade system     FAMILY HISTORY:  We obtained a detailed, 4-generation family history.  Significant diagnoses are listed below: Family History  Problem Relation Age of Onset   Liver cancer Mother 56       gallbaldder   Hypertension Mother    Hyperlipidemia Mother    Hyperlipidemia Father    Hypertension Father    Diabetes Maternal Aunt    Diabetes Maternal Uncle    Heart disease Maternal Uncle    Breast cancer Paternal Aunt    Diabetes Paternal Aunt    Breast cancer Paternal Aunt    Diabetes Paternal Uncle    Dementia Maternal Grandmother    Stroke Maternal Grandfather    Diabetes Paternal Grandmother    Heart disease Paternal Grandfather    Other Cousin 52       BRCA or similar mutation positive   Sierra Copeland does not have children or siblings.   Sierra Copeland mother had gallbladder cancer at 58 and died at 55. Patient had 1 maternal aunt, 1 uncle, no cancer. Grandparents passed over age 6.   Sierra Copeland  father died at 89. Patient had 14 paternal aunts/uncles. Two aunts had breast cancer, 1 aunt had unknown type cancer. A cousin also had breast cancer. Paternal grandmother passed at 48 and grandfather passed over age 64.   Sierra Copeland is unaware of previous family history of genetic testing for hereditary cancer risks. Patient's maternal ancestors are of unknown descent, and paternal ancestors are of unknown descent. There is no reported Ashkenazi Jewish ancestry. There is no known consanguinity.     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  The test report has been scanned into EPIC and is located under the Molecular Pathology section of the Results Review tab.  A portion of the result report is included below for reference. Genetic testing reported out on 03/30/2022    Clinical Information Pathogenic variants in the PMS2 gene are associated with Lynch syndrome.  The cancers associated with PMS2 are: Colorectal cancer, 8.7-20% risk (average age of diagnosis is 69-66) Endometrial cancer, 13-26% risk (average age of diagnosis is 54-50) Ovarian cancer, up to a 3% risk (average age of diagnosis is 56-59) Renal pelvis and/or ureter, up to a 3.7% risk (average age of diagnosis is unknown) Breast, prostate, pancreatic, gastric, small bowel, bladder, biliary tract, and brain cancer risk may be elevated  Management Recommendations:  Colorectal Cancer Screening: High quality colonoscopy at age 35-35 or 2-5  years prior to the earliest colon cancer if it is diagnosed before age 20 and repeat every 1-3 years Studies have demonstrated that the use of daily aspirin decreases the colorectal cancer risk in patients with Lynch Syndrome. Ongoing studies are investigating the optimal dose and duration of use of aspirin for colon cancer prevention in Lynch Syndrome. The decision to use  aspirin should be made on an individualized basis including a discussion of dose, benefits, and adverse effects.    Endometrial Cancer Screening/Risk Reduction: Women should report any abnormal uterine bleeding or postmenopausal bleeding. The evaluation of these symptoms should include an endometrial biopsy. A hysterectomy may be considered. The timing should be individualized based on whether childbearing is complete, comorbidities, and family history. Endometrial cancer screening does not have a proven benefit in women with Lynch Syndrome. However, endometrial biopsy is highly sensitive and specific as a diagnostic procedure. Screening via endometrial biopsy every 1-2 years starting at age 79-35 can be considered. Transvaginal ultrasounds may be considered in postmenopausal women at their clinician's discretion.  Ovarian Cancer Screening/Risk Reduction: There is currently insufficient evidence to make a specific recommendation for a prophylactic bilateral salpingo-oophorectomy (BSO), or having the ovaries and fallopian tubes removed, for individuals who carry a PMS2 pathogenic variant. Current data does not support routine ovarian screening for Lynch syndrome, therefore it may be considered at the clinician's discretion. Screening includes transvaginal ultrasounds and a blood test to measure CA-125 levels every 6-12 months. If there is a family history of ovarian cancer, have a discussion with your physician about the benefits and limitations of screening and risk reduction strategies.  Women should be aware of symptoms that might be associated with the development of ovarian cancer including pelvic or abdominal pain, bloating, increased abdominal girth, difficulty eating, early satiety, or urinary frequency or urgency. Symptoms that persist for several weeks and are a change from a woman's baseline should prompt evaluation by her physician.  Urothelial Cancer Screening: Annual urinalysis starting  at age 28-35 may be considered in selected individuals such as those with a family history of urothelial cancer (renal pelvis, ureter, and/or bladder).  Gastric and Small Bowel Cancer Screening: Consider upper GI surveillance with EGD starting at age 28-40 years and repeat every 2-4 years, preferably performed in conjunction with colonoscopy.  Random biopsy of the proximal and distal stomach should at minimum be performed on the initial procedure to assess for H. pylori, autoimmune gastritis, and intestinal metaplasia. Push enteroscopy can be considered in place of EGD to enhance small bowel visualization, although its incremental yield for detection of neoplasia over EGD remains uncertain. Individuals not undergoing upper endoscopic surveillance should have one-time noninvasive testing for H. pylori at the time of Lynch Syndrome diagnosis, with treatment indicated if H. pylori is detected.   Pancreatic Cancer Screening: Avoid smoking, heavy alcohol use, and obesity. It has been suggested that pancreatic cancer screening be limited to those with a family history of pancreatic cancer (first- or second-degree relative). Ideally, screening should be performed in experienced centers utilizing a multidisciplinary approach under research conditions. Recommended screening include annual endoscopic ultrasound (preferred) and/or MRI of the pancreas starting at age 71 or 7 years younger than the earliest age diagnosis in the family. Annual concurrent CA19-9 testing should also be considered.  Prostate Cancer Screening: Consider beginning annual PSA blood test and digital rectal exams at age 33.  Breast Cancer Screening/Risk Reduction: Breast awareness beginning at age 3 Monthly self-breast examination beginning at age 79 Annual clinical breast  examinations beginning at age 25 Annual mammogram beginning at age 69 with consideration of tomosynthesis Evidence is insufficient for a prophylactic risk-reducing  mastectomy, manage based on family history    Brain Cancer Screening: Patients should be educated regarding signs and symptoms of neurologic cancer and the importance of prompt reporting of abnormal symptoms to their physicians.  Skin Cancer Screening: Frequency of malignant and benign skin tumors such as sebaceous adenocarcinomas, sebaceous adenomas, and keratoacanthomas has been reported to be increased among patients with Lynch Syndrome, but cumulative lifetime risk and median age of presentation are uncertain. Further, an elevated risk of sebaceous tumors and keratoacanthoma has not been documented for PMS2 carriers. Consider skin exam every 1-2 years with a health care provider skilled in [place] skin manifestations. Age to start surveillance is uncertain and can be individualized.  Additional Considerations: Patients of reproductive age should be made aware of options for prenatal diagnosis and assisted reproduction including pre-implantation genetic diagnosis. Individuals with a single pathogenic PMS2 variant are also carriers of constitutional MMR deficiency (CMMRD) syndrome. CMMRD is a childhood-onset cancer predisposition syndrome that can present with hematological malignancies, cancers of the brain and central nervous system, Lynch syndrome-associated cancers (colon, uterine, small bowel, urinary tract), embryonic tumors, and sarcomas. Some affected individuals may also display cafe-au-lait macules. For there to be a risk of CMMRD in offspring, an individual and their partner would each have to have a single pathogenic variant in the same MMR gene; in such a case, the risk of having an affected child is 25%.   This information is based on current understanding of the gene and may change in the future.  Implications for Family Members: Hereditary predisposition to cancer due to pathogenic variants in the PMS2 gene has autosomal dominant inheritance. This means  that an individual with a pathogenic variant has a 50% chance of passing the condition on to his/her offspring. Most cases are inherited from a parent, but some cases may occur spontaneously (i.e., an individual with a pathogenic variant has parents who do not have it). Identification of a pathogenic variant allows for the recognition of at-risk relatives who can pursue testing for the familial variant.  Family members are encouraged to consider genetic testing for this familial pathogenic variant. As there are generally no childhood cancer risks associated with pathogenic variants in the PSM2 gene, individuals in the family are not recommended to have testing until they reach at least 66 years of age. They may contact our office at [phone] for more information or to schedule an appointment.  Family members who live outside of the area are encouraged to find a genetic counselor in their area by visiting: [email or web address].  Resources: FORCE (Facing Our Risk of Cancer Empowered) is a resource for those with a hereditary predisposition to develop cancer.  FORCE provides information about risk reduction, advocacy, legislation, and clinical trials.  Additionally, [employer] provides a platform for collaboration and support which includes: peer navigation, message boards, local support groups, a toll-free helpline, research registry and recruitment, advocate training, published medical research, webinars, brochures, mastectomy photos, and more.  For more information, visit www.facingourrisk.[place].org  Lynch [employer]- www.lynchcancers.com  [employer]- www.kintalk.org   PLAN: 1. These results will be made available to  her oncologist, Dr. Grayland Ormond. She would like her Dr. Grayland Ormond and her PCP to follow her long-term for this indication and coordinate screening/prophylactic surgeries.    2. Sierra Copeland plans to discuss  these results with her  family and will reach out to [place] if we can be of any assistance in coordinating genetic testing for any of her relatives.    We encouraged Sierra Copeland to remain in contact with [place] on an annual basis so we can update her personal and family histories, and let her know of advances in cancer genetics that may benefit the family. Our contact number was provided. Sierra Copeland questions were answered to her satisfaction today, and she knows she is welcome to call anytime with additional questions.   Faith Rogue, MS, [facility] Genetic Counselor [email or web address] Phone: [phone]

## 2023-03-09 ENCOUNTER — Telehealth: Payer: Self-pay | Admitting: Family Medicine

## 2023-03-09 NOTE — Telephone Encounter (Signed)
-----   Message from Dorcas Carrow, DO sent at 02/23/2023 11:30 AM EDT ----- Check in on her sugars

## 2023-03-09 NOTE — Telephone Encounter (Signed)
Spoke with patient to follow up with her regarding previous telephone encounter with her sugars per Dr Laural Benes. Patient says she is doing better as she has not noticed her sugars being low low anymore, but they have been up and down. Patient says she woke with a headache this morning, but other than that she feels fine. Advised patient to give our office a call back if she has any concerns. Patient verbalized understanding.

## 2023-03-09 NOTE — Telephone Encounter (Signed)
Can we please call over and see what her sugars have been like?

## 2023-03-24 ENCOUNTER — Encounter: Payer: Self-pay | Admitting: Family Medicine

## 2023-03-24 ENCOUNTER — Ambulatory Visit (INDEPENDENT_AMBULATORY_CARE_PROVIDER_SITE_OTHER): Payer: Medicare Other | Admitting: Family Medicine

## 2023-03-24 VITALS — BP 159/72 | HR 71 | Temp 98.6°F | Wt 215.7 lb

## 2023-03-24 DIAGNOSIS — Z794 Long term (current) use of insulin: Secondary | ICD-10-CM | POA: Diagnosis not present

## 2023-03-24 DIAGNOSIS — E1165 Type 2 diabetes mellitus with hyperglycemia: Secondary | ICD-10-CM | POA: Diagnosis not present

## 2023-03-24 MED ORDER — BUSPIRONE HCL 5 MG PO TABS: 5.0000 mg | ORAL_TABLET | Freq: Two times a day (BID) | ORAL | 5 refills | Status: DC

## 2023-03-24 MED ORDER — TOUJEO SOLOSTAR 300 UNIT/ML ~~LOC~~ SOPN: PEN_INJECTOR | SUBCUTANEOUS | 12 refills | Status: DC

## 2023-03-24 MED ORDER — OZEMPIC (0.25 OR 0.5 MG/DOSE) 2 MG/3ML ~~LOC~~ SOPN: 0.5000 mg | PEN_INJECTOR | SUBCUTANEOUS | 1 refills | Status: DC

## 2023-03-24 NOTE — Progress Notes (Signed)
BP (!) 159/72 (BP Location: Left Arm, Cuff Size: Normal)   Pulse 71   Temp 98.6 F (37 C) (Oral)   Wt 215 lb 11.2 oz (97.8 kg)   LMP 03/17/2013 (Approximate)   SpO2 94%   BMI 39.77 kg/m    Subjective:    Patient ID: Sierra Copeland, female    DOB: [birth year], 67 y.o.   MRN: [id number]  HPI: Sierra Copeland is a 67 y.o. female  Chief Complaint  Patient presents with   Diabetes   DIABETES Hypoglycemic episodes:no Polydipsia/polyuria: no Visual disturbance: no Chest pain: no Paresthesias: no Glucose Monitoring: yes  Accucheck frequency: continous  Fasting glucose: 80  Post prandial: 200s Taking Insulin?: yes  Long acting insulin: 60 units toujeo  Short acting insulin: 10-20 units humalog Blood Pressure Monitoring: not checking Retinal Examination: Up to Date Foot Exam: Up to Date Diabetic Education: Completed Pneumovax: Up to Date Influenza: Up to Date Aspirin: no  Relevant past medical, surgical, family and social history reviewed and updated as indicated. Interim medical history since our last visit reviewed. Allergies and medications reviewed and updated.  Review of Systems  Constitutional: Negative.   Respiratory: Negative.    Cardiovascular: Negative.   Gastrointestinal: Negative.   Musculoskeletal: Negative.   Neurological: Negative.   Psychiatric/Behavioral: Negative.      Per HPI unless specifically indicated above     Objective:    BP (!) 159/72 (BP Location: Left Arm, Cuff Size: Normal)   Pulse 71   Temp 98.6 F (37 C) (Oral)   Wt 215 lb 11.2 oz (97.8 kg)   LMP 03/17/2013 (Approximate)   SpO2 94%   BMI 39.77 kg/m   Wt Readings from Last 3 Encounters:  03/24/23 215 lb 11.2 oz (97.8 kg)  02/03/23 213 lb (96.6 kg)  01/24/23 214 lb 8 oz (97.3 kg)    Physical Exam Vitals and nursing note reviewed.  Constitutional:      General: She is not in acute distress.    Appearance: Normal appearance. She is obese. She is not ill-appearing,  toxic-appearing or diaphoretic.  HENT:     Head: Normocephalic and atraumatic.     Right Ear: External ear normal.     Left Ear: External ear normal.     Nose: Nose normal.     Mouth/Throat:     Mouth: Mucous membranes are moist.     Pharynx: Oropharynx is clear.  Eyes:     General: No scleral icterus.       Right eye: No discharge.        Left eye: No discharge.     Extraocular Movements: Extraocular movements intact.     Conjunctiva/sclera: Conjunctivae normal.     Pupils: Pupils are equal, round, and reactive to light.  Cardiovascular:     Rate and Rhythm: Normal rate and regular rhythm.     Pulses: Normal pulses.     Heart sounds: Normal heart sounds. No murmur heard.    No friction rub. No gallop.  Pulmonary:     Effort: Pulmonary effort is normal. No respiratory distress.     Breath sounds: Normal breath sounds. No stridor. No wheezing, rhonchi or rales.  Chest:     Chest wall: No tenderness.  Musculoskeletal:        General: Normal range of motion.     Cervical back: Normal range of motion and neck supple.  Skin:    General: Skin is warm and dry.  Capillary Refill: Capillary refill takes less than 2 seconds.     Coloration: Skin is not jaundiced or pale.     Findings: No bruising, erythema, lesion or rash.  Neurological:     General: No focal deficit present.     Mental Status: She is alert and oriented to person, place, and time. Mental status is at baseline.  Psychiatric:        Mood and Affect: Mood normal.        Behavior: Behavior normal.        Thought Content: Thought content normal.        Judgment: Judgment normal.     Results for orders placed or performed in visit on 02/11/23  HM DIABETES EYE EXAM  Result Value Ref Range   HM Diabetic Eye Exam No Retinopathy No Retinopathy      Assessment & Plan:   Problem List Items Addressed This Visit       Endocrine   Type 2 diabetes mellitus with hyperglycemia - Primary    Will increase her ozempic  to 0.5mg  and cut her toujeo to 50 units. Continue to cut down on insulin and titrate up ozempic. Recheck 1 month.       Relevant Medications   Semaglutide,0.25 or 0.5MG /DOS, (OZEMPIC, 0.25 OR 0.5 MG/DOSE,) 2 MG/3ML SOPN   insulin glargine, 1 Unit Dial, (TOUJEO SOLOSTAR) 300 UNIT/ML Solostar Pen     Follow up plan: Return in about 4 weeks (around 04/21/2023).

## 2023-03-24 NOTE — Assessment & Plan Note (Signed)
Will increase her ozempic to 0.5mg  and cut her toujeo to 50 units. Continue to cut down on insulin and titrate up ozempic. Recheck 1 month.

## 2023-03-31 ENCOUNTER — Other Ambulatory Visit: Payer: Self-pay | Admitting: Family Medicine

## 2023-04-01 NOTE — Telephone Encounter (Signed)
Unable to refill per protocol, Rx request is too soon. Last refill 01/24/23 for 90 and 1 refill.  Requested Prescriptions  Pending Prescriptions Disp Refills   lisinopril (ZESTRIL) 20 MG tablet [Pharmacy Med Name: LISINOPRIL 20 MG TAB] 90 tablet 1    Sig: TAKE 1 TABLET BY MOUTH ONCE DAILY     Cardiovascular:  ACE Inhibitors Failed - 03/31/2023  4:17 PM      Failed - Last BP in normal range    BP Readings from Last 1 Encounters:  03/24/23 (!) 159/72         Passed - Cr in normal range and within 180 days    Creatinine, Ser  Date Value Ref Range Status  01/24/2023 0.87 0.57 - 1.00 mg/dL Final         Passed - K in normal range and within 180 days    Potassium  Date Value Ref Range Status  01/24/2023 4.2 3.5 - 5.2 mmol/L Final         Passed - Patient is not pregnant      Passed - Valid encounter within last 6 months    Recent Outpatient Visits           1 week ago Type 2 diabetes mellitus with hyperglycemia, with long-term current use of insulin (HCC)   [facility] [place], Megan P, DO   1 month ago Type 2 diabetes mellitus with hyperglycemia, with long-term current use of insulin (HCC)   [facility] [place], Megan P, DO   2 months ago Routine general medical examination at a health care facility   [facility] [place], [place] P, DO   3 months ago Acute bacterial sinusitis   [facility] [address] [place], Erin E, PA-C   4 months ago Dysfunction of left eustachian tube   [facility] [place], Oralia Rud, DO       Future Appointments             In 3 weeks Laural Benes, Oralia Rud, DO [facility]

## 2023-04-05 ENCOUNTER — Other Ambulatory Visit: Payer: Self-pay | Admitting: Family Medicine

## 2023-04-05 DIAGNOSIS — Z794 Long term (current) use of insulin: Secondary | ICD-10-CM

## 2023-04-05 DIAGNOSIS — E1169 Type 2 diabetes mellitus with other specified complication: Secondary | ICD-10-CM

## 2023-04-06 NOTE — Telephone Encounter (Signed)
Requested Prescriptions  Pending Prescriptions Disp Refills   Continuous Glucose Sensor (FREESTYLE LIBRE 3 SENSOR) MISC [Pharmacy Med Name: FREESTYLE LIBRE 3 SENSOR] 1 each 0    Sig: USE 1 SENSOR ON THE SKIN EVERY 14 DAYS. USE TO CHECK BLOOD GLUCOSE CONTINUOUSLY     Endocrinology: Diabetes - Testing Supplies Passed - 04/05/2023  6:58 PM      Passed - Valid encounter within last 12 months    Recent Outpatient Visits           1 week ago Type 2 diabetes mellitus with hyperglycemia, with long-term current use of insulin (HCC)   [facility] [place], Megan P, DO   1 month ago Type 2 diabetes mellitus with hyperglycemia, with long-term current use of insulin (HCC)   [facility] [place], Megan P, DO   2 months ago Routine general medical examination at a health care facility   [facility], [place] P, DO   3 months ago Acute bacterial sinusitis   [facility] [place], Erin E, PA-C   4 months ago Dysfunction of left eustachian tube   [facility] Dorcas Carrow, DO       Future Appointments             In 2 weeks Laural Benes, Oralia Rud, DO  [facility], [facility]

## 2023-04-22 ENCOUNTER — Encounter: Payer: Self-pay | Admitting: Family Medicine

## 2023-04-22 ENCOUNTER — Ambulatory Visit (INDEPENDENT_AMBULATORY_CARE_PROVIDER_SITE_OTHER): Payer: Medicare Other | Admitting: Family Medicine

## 2023-04-22 VITALS — BP 143/84 | HR 91 | Ht 61.75 in | Wt 214.2 lb

## 2023-04-22 DIAGNOSIS — I129 Hypertensive chronic kidney disease with stage 1 through stage 4 chronic kidney disease, or unspecified chronic kidney disease: Secondary | ICD-10-CM | POA: Diagnosis not present

## 2023-04-22 DIAGNOSIS — Z794 Long term (current) use of insulin: Secondary | ICD-10-CM | POA: Diagnosis not present

## 2023-04-22 DIAGNOSIS — E1165 Type 2 diabetes mellitus with hyperglycemia: Secondary | ICD-10-CM | POA: Diagnosis not present

## 2023-04-22 DIAGNOSIS — R1011 Right upper quadrant pain: Secondary | ICD-10-CM | POA: Diagnosis not present

## 2023-04-22 LAB — BAYER DCA HB A1C WAIVED: HB A1C (BAYER DCA - WAIVED): 8.7 % — ABNORMAL HIGH (ref 4.8–5.6)

## 2023-04-22 MED ORDER — ONDANSETRON HCL 4 MG PO TABS: 4.0000 mg | ORAL_TABLET | Freq: Three times a day (TID) | ORAL | 3 refills | Status: AC | PRN

## 2023-04-22 MED ORDER — LISINOPRIL 30 MG PO TABS: 30.0000 mg | ORAL_TABLET | Freq: Every day | ORAL | 3 refills | Status: DC

## 2023-04-22 MED ORDER — FREESTYLE LIBRE 3 SENSOR MISC
0 refills | Status: DC
Start: 2023-04-22 — End: 2024-06-08

## 2023-04-22 NOTE — Assessment & Plan Note (Signed)
Running high. Will increase her lisinopril to 30mg . Recheck 3 months.

## 2023-04-22 NOTE — Assessment & Plan Note (Signed)
Not under good control. A1c better at 8.7. Will try another week of the 0.5mg  ozempic and see how she does- if feeling better, will continue that dose and titrate down on her insulin. Continue to monitor. Call with any concerns. Recheck 3 months.

## 2023-04-22 NOTE — Progress Notes (Signed)
BP (!) 143/84 (BP Location: Right Arm, Cuff Size: Normal)   Pulse 91   Ht 5' 1.75" (1.568 m)   Wt 214 lb 3.2 oz (97.2 kg)   LMP 03/17/2013 (Approximate)   SpO2 98%   BMI 39.50 kg/m    Subjective:    Patient ID: Sierra Copeland, female    DOB: 04/22/1956, 67 y.o.   MRN: [id number]  HPI: Sierra Copeland is a 67 y.o. female  Chief Complaint  Patient presents with   Diabetes    Patient says the Ozempic does not set right with her body as she has experiencing nausea, upset stomach and affecting her gallbladder. Patient says she has not taken it this week yet and says she feels better.    DIABETES Hypoglycemic episodes:no Polydipsia/polyuria: no Visual disturbance: no Chest pain: no Paresthesias: yes Glucose Monitoring: yes  Accucheck frequency: TID  Fasting glucose: 70  Post prandial: 250 Taking Insulin?: yes  Long acting insulin: 60-65 units qHS  Short acting insulin: sliding scale Blood Pressure Monitoring: not checking Retinal Examination: Up to Date Foot Exam: Up to Date Diabetic Education: Completed Pneumovax: Up to Date Influenza: Up to Date Aspirin: no  HYPERTENSION  Hypertension status: uncontrolled  Satisfied with current treatment? no Duration of hypertension: chronic BP monitoring frequency:  a few times a week BP medication side effects:  no Medication compliance: good compliance Aspirin: no Recurrent headaches: no Visual changes: no Palpitations: no Dyspnea: no Chest pain: no Lower extremity edema: no Dizzy/lightheaded: no   Relevant past medical, surgical, family and social history reviewed and updated as indicated. Interim medical history since our last visit reviewed. Allergies and medications reviewed and updated.  Review of Systems  Constitutional: Negative.   Respiratory: Negative.    Cardiovascular: Negative.   Gastrointestinal:  Positive for abdominal pain (RUQ). Negative for abdominal distention, anal bleeding, blood in stool,  constipation, diarrhea, nausea, rectal pain and vomiting.  Musculoskeletal: Negative.   Neurological: Negative.   Psychiatric/Behavioral: Negative.      Per HPI unless specifically indicated above     Objective:    BP (!) 143/84 (BP Location: Right Arm, Cuff Size: Normal)   Pulse 91   Ht 5' 1.75" (1.568 m)   Wt 214 lb 3.2 oz (97.2 kg)   LMP 03/17/2013 (Approximate)   SpO2 98%   BMI 39.50 kg/m   Wt Readings from Last 3 Encounters:  04/22/23 214 lb 3.2 oz (97.2 kg)  03/24/23 215 lb 11.2 oz (97.8 kg)  02/03/23 213 lb (96.6 kg)    Physical Exam Vitals and nursing note reviewed.  Constitutional:      General: She is not in acute distress.    Appearance: Normal appearance. She is obese. She is not ill-appearing, toxic-appearing or diaphoretic.  HENT:     Head: Normocephalic and atraumatic.     Right Ear: External ear normal.     Left Ear: External ear normal.     Nose: Nose normal.     Mouth/Throat:     Mouth: Mucous membranes are moist.     Pharynx: Oropharynx is clear.  Eyes:     General: No scleral icterus.       Right eye: No discharge.        Left eye: No discharge.     Extraocular Movements: Extraocular movements intact.     Conjunctiva/sclera: Conjunctivae normal.     Pupils: Pupils are equal, round, and reactive to light.  Cardiovascular:     Rate and  Rhythm: Normal rate and regular rhythm.     Pulses: Normal pulses.     Heart sounds: Normal heart sounds. No murmur heard.    No friction rub. No gallop.  Pulmonary:     Effort: Pulmonary effort is normal. No respiratory distress.     Breath sounds: Normal breath sounds. No stridor. No wheezing, rhonchi or rales.  Chest:     Chest wall: No tenderness.  Musculoskeletal:        General: Normal range of motion.     Cervical back: Normal range of motion and neck supple.  Skin:    General: Skin is warm and dry.     Capillary Refill: Capillary refill takes less than 2 seconds.     Coloration: Skin is not jaundiced  or pale.     Findings: No bruising, erythema, lesion or rash.  Neurological:     General: No focal deficit present.     Mental Status: She is alert and oriented to person, place, and time. Mental status is at baseline.  Psychiatric:        Mood and Affect: Mood normal.        Behavior: Behavior normal.        Thought Content: Thought content normal.        Judgment: Judgment normal.     Results for orders placed or performed in visit on 02/11/23  HM DIABETES EYE EXAM  Result Value Ref Range   HM Diabetic Eye Exam No Retinopathy No Retinopathy      Assessment & Plan:   Problem List Items Addressed This Visit       Endocrine   Type 2 diabetes mellitus with hyperglycemia (HCC) - Primary    Not under good control. A1c better at 8.7. Will try another week of the 0.5mg  ozempic and see how she does- if feeling better, will continue that dose and titrate down on her insulin. Continue to monitor. Call with any concerns. Recheck 3 months.      Relevant Medications   Continuous Glucose Sensor (FREESTYLE LIBRE 3 SENSOR) MISC   lisinopril (ZESTRIL) 30 MG tablet   Other Relevant Orders   Bayer DCA Hb A1c Waived     Genitourinary   Benign hypertensive renal disease    Running high. Will increase her lisinopril to 30mg . Recheck 3 months.       Other Visit Diagnoses     RUQ pain       Will get RUQ [place]. Await results. Treat as needed.   Relevant Orders   US Abdomen Limited RUQ (LIVER/GB)        Follow up plan: Return in about 3 months (around 07/23/2023).

## 2023-04-26 DIAGNOSIS — G8929 Other chronic pain: Secondary | ICD-10-CM | POA: Diagnosis not present

## 2023-04-26 DIAGNOSIS — E1165 Type 2 diabetes mellitus with hyperglycemia: Secondary | ICD-10-CM | POA: Diagnosis not present

## 2023-04-26 DIAGNOSIS — M1711 Unilateral primary osteoarthritis, right knee: Secondary | ICD-10-CM | POA: Diagnosis not present

## 2023-05-02 ENCOUNTER — Ambulatory Visit
Admission: RE | Admit: 2023-05-02 | Discharge: 2023-05-02 | Disposition: A | Discharge status: Home / Self Care | Payer: Medicare Other | Source: Ambulatory Visit | Attending: Family Medicine | Admitting: Family Medicine

## 2023-05-02 DIAGNOSIS — R1011 Right upper quadrant pain: Secondary | ICD-10-CM

## 2023-05-03 ENCOUNTER — Other Ambulatory Visit: Payer: Self-pay | Admitting: Family Medicine

## 2023-05-03 DIAGNOSIS — K8020 Calculus of gallbladder without cholecystitis without obstruction: Secondary | ICD-10-CM

## 2023-05-04 ENCOUNTER — Other Ambulatory Visit: Payer: Self-pay | Admitting: Family Medicine

## 2023-05-04 ENCOUNTER — Other Ambulatory Visit: Payer: Self-pay

## 2023-05-04 NOTE — Telephone Encounter (Signed)
Refused Zocor 40 mg because it's being requested too soon.

## 2023-05-04 NOTE — Telephone Encounter (Signed)
Contacted [facility]. RX was not transmitted to the pharmacy in February. States transmission failed on the RX in chart. Pharmacy is requesting to have the RX resent. RX t'd up.

## 2023-05-04 NOTE — Telephone Encounter (Unsigned)
Copied from CRM [id number]. Topic: General - Other >> May 04, 2023  3:41 PM Dondra Prader E wrote: Reason for CRM: Mandy from [facility] called to report that they never received a refill request for simvastatin (ZOCOR) 40 MG table, they are asking if the provider can resubmit this.

## 2023-05-05 MED ORDER — SIMVASTATIN 40 MG PO TABS: 40.0000 mg | ORAL_TABLET | Freq: Every day | ORAL | 1 refills | Status: DC

## 2023-05-30 ENCOUNTER — Telehealth: Payer: Self-pay | Admitting: Family Medicine

## 2023-05-30 NOTE — Telephone Encounter (Signed)
Patient came into office to drop off Disability Parking Placard form for completion by Dr. Laural Benes. Informed patient to allow 48-72 hours for completion. Patient request phone call after completed. [phone] Placed forms in providers box.

## 2023-05-31 NOTE — Telephone Encounter (Signed)
Placed in provider folder

## 2023-06-06 ENCOUNTER — Ambulatory Visit: Payer: Self-pay

## 2023-06-06 NOTE — Telephone Encounter (Signed)
  Chief Complaint: Sore throat, HA, Chills, cough, green mucus, diarrhea Symptoms: above  Frequency: weds last week Pertinent Negatives: Patient denies Fever Disposition: [] ED /[x] Urgent Care (no appt availability in office) / [] Appointment(In office/virtual)/ []  [facility]/ [] Home Care/ [] Refused Recommended Disposition /[] [facility]/ []  Follow-up with PCP Additional Notes: Pt has tried a few OTC medications without much relief. Pt reports URI s/s pt wants to be seen in the office. No appt available. Offered [facility], pt declined. Pt will go to UC. Pt also states that she is coming into the office to pick up paperwork tomorrow.    Summary: cold sx advise   Pt is calling to report sore throat, congestion, and congestion. Pt request apppt for tomorrow. No available appts. Please advise     Reason for Disposition  Cough with cold symptoms (e.g., runny nose, postnasal drip, throat clearing)  Answer Assessment - Initial Assessment Questions 1. ONSET: "When did the cough begin?"      Friday 2. SEVERITY: "How bad is the cough today?"      moderate 3. SPUTUM: "Describe the color of your sputum" (none, dry cough; clear, white, yellow, green)     Green  5. DIFFICULTY BREATHING: "Are you having difficulty breathing?" If Yes, ask: "How bad is it?" (e.g., mild, moderate, severe)    - MILD: No SOB at rest, mild SOB with walking, speaks normally in sentences, can lie down, no retractions, pulse < 100.    - MODERATE: SOB at rest, SOB with minimal exertion and prefers to sit, cannot lie down flat, speaks in phrases, mild retractions, audible wheezing, pulse 100-120.    - SEVERE: Very SOB at rest, speaks in single words, struggling to breathe, sitting hunched forward, retractions, pulse > 120      none 6. FEVER: "Do you have a fever?" If Yes, ask: "What is your temperature, how was it measured, and when did it start?"     Chills 7. CARDIAC HISTORY: "Do you have any history of  heart disease?" (e.g., heart attack, congestive heart failure)      no 8. LUNG HISTORY: "Do you have any history of lung disease?"  (e.g., pulmonary embolus, asthma, emphysema)     no 10. OTHER SYMPTOMS: "Do you have any other symptoms?" (e.g., runny nose, wheezing, chest pain)       HA, and sore throat, Diarrhea.  Protocols used: Cough - Acute Productive-A-AH

## 2023-06-15 ENCOUNTER — Other Ambulatory Visit: Payer: Self-pay

## 2023-06-15 ENCOUNTER — Telehealth: Payer: Self-pay | Admitting: Surgery

## 2023-06-15 DIAGNOSIS — E1165 Type 2 diabetes mellitus with hyperglycemia: Secondary | ICD-10-CM

## 2023-06-15 DIAGNOSIS — M5432 Sciatica, left side: Secondary | ICD-10-CM | POA: Diagnosis not present

## 2023-06-15 DIAGNOSIS — M9901 Segmental and somatic dysfunction of cervical region: Secondary | ICD-10-CM | POA: Diagnosis not present

## 2023-06-15 DIAGNOSIS — M9903 Segmental and somatic dysfunction of lumbar region: Secondary | ICD-10-CM | POA: Diagnosis not present

## 2023-06-15 DIAGNOSIS — M5412 Radiculopathy, cervical region: Secondary | ICD-10-CM | POA: Diagnosis not present

## 2023-06-15 NOTE — Telephone Encounter (Signed)
Called patient, left detailed message.  Order for A1C is placed, she will need to go to [facility], medical mall entrance.  No appointment for lab is necessary and no fasting needed.

## 2023-06-16 ENCOUNTER — Ambulatory Visit: Payer: Medicare Other | Admitting: Surgery

## 2023-06-27 DIAGNOSIS — M5412 Radiculopathy, cervical region: Secondary | ICD-10-CM | POA: Diagnosis not present

## 2023-06-27 DIAGNOSIS — M5416 Radiculopathy, lumbar region: Secondary | ICD-10-CM | POA: Diagnosis not present

## 2023-06-27 DIAGNOSIS — M65341 Trigger finger, right ring finger: Secondary | ICD-10-CM | POA: Diagnosis not present

## 2023-06-27 DIAGNOSIS — Z79899 Other long term (current) drug therapy: Secondary | ICD-10-CM | POA: Diagnosis not present

## 2023-06-27 DIAGNOSIS — M5126 Other intervertebral disc displacement, lumbar region: Secondary | ICD-10-CM | POA: Diagnosis not present

## 2023-06-27 DIAGNOSIS — M47812 Spondylosis without myelopathy or radiculopathy, cervical region: Secondary | ICD-10-CM | POA: Diagnosis not present

## 2023-06-28 ENCOUNTER — Ambulatory Visit: Payer: Medicare Other | Admitting: Surgery

## 2023-07-11 ENCOUNTER — Other Ambulatory Visit: Payer: Disability Insurance

## 2023-07-15 DIAGNOSIS — M05731 Rheumatoid arthritis with rheumatoid factor of right wrist without organ or systems involvement: Secondary | ICD-10-CM | POA: Diagnosis not present

## 2023-07-15 DIAGNOSIS — M65831 Other synovitis and tenosynovitis, right forearm: Secondary | ICD-10-CM | POA: Diagnosis not present

## 2023-07-19 ENCOUNTER — Other Ambulatory Visit: Payer: Self-pay | Admitting: Family Medicine

## 2023-07-20 NOTE — Telephone Encounter (Signed)
Requested Prescriptions  Pending Prescriptions Disp Refills   lisinopril (ZESTRIL) 30 MG tablet [Pharmacy Med Name: LISINOPRIL 30 MG TAB] 90 tablet 0    Sig: TAKE 1 TABLET BY MOUTH ONCE DAILY *DOSE INCREASE     Cardiovascular:  ACE Inhibitors Failed - 07/19/2023  9:55 AM      Failed - Last BP in normal range    BP Readings from Last 1 Encounters:  04/22/23 (!) 143/84         Passed - Cr in normal range and within 180 days    Creatinine, Ser  Date Value Ref Range Status  01/24/2023 0.87 0.57 - 1.00 mg/dL Final         Passed - K in normal range and within 180 days    Potassium  Date Value Ref Range Status  01/24/2023 4.2 3.5 - 5.2 mmol/L Final         Passed - Patient is not pregnant      Passed - Valid encounter within last 6 months    Recent Outpatient Visits           2 months ago Type 2 diabetes mellitus with hyperglycemia, with long-term current use of insulin (HCC)   [place] [facility] [place], Megan P, DO   3 months ago Type 2 diabetes mellitus with hyperglycemia, with long-term current use of insulin (HCC)   [place] [facility] [place], Megan P, DO   4 months ago Type 2 diabetes mellitus with hyperglycemia, with long-term current use of insulin (HCC)   [place] [facility] [place], Megan P, DO   5 months ago Routine general medical examination at a health care facility   [facility] [place], [place] P, DO   6 months ago Acute bacterial sinusitis   [place] [facility] [place], Oswaldo Conroy, PA-C       Future Appointments             In 5 days Laural Benes, Oralia Rud, DO [place] [facility]

## 2023-07-25 ENCOUNTER — Encounter: Payer: Self-pay | Admitting: Family Medicine

## 2023-07-25 ENCOUNTER — Ambulatory Visit (INDEPENDENT_AMBULATORY_CARE_PROVIDER_SITE_OTHER): Payer: Medicare Other | Admitting: Family Medicine

## 2023-07-25 VITALS — BP 175/84 | HR 82 | Ht 61.75 in | Wt 215.0 lb

## 2023-07-25 DIAGNOSIS — I129 Hypertensive chronic kidney disease with stage 1 through stage 4 chronic kidney disease, or unspecified chronic kidney disease: Secondary | ICD-10-CM | POA: Diagnosis not present

## 2023-07-25 DIAGNOSIS — E669 Obesity, unspecified: Secondary | ICD-10-CM

## 2023-07-25 DIAGNOSIS — E7800 Pure hypercholesterolemia, unspecified: Secondary | ICD-10-CM | POA: Diagnosis not present

## 2023-07-25 DIAGNOSIS — F3342 Major depressive disorder, recurrent, in full remission: Secondary | ICD-10-CM | POA: Diagnosis not present

## 2023-07-25 DIAGNOSIS — E1169 Type 2 diabetes mellitus with other specified complication: Secondary | ICD-10-CM

## 2023-07-25 DIAGNOSIS — Z794 Long term (current) use of insulin: Secondary | ICD-10-CM | POA: Diagnosis not present

## 2023-07-25 LAB — BAYER DCA HB A1C WAIVED: HB A1C (BAYER DCA - WAIVED): 8.6 % — ABNORMAL HIGH (ref 4.8–5.6)

## 2023-07-25 MED ORDER — GABAPENTIN 100 MG PO CAPS: 200.0000 mg | ORAL_CAPSULE | Freq: Two times a day (BID) | ORAL | 1 refills | Status: DC

## 2023-07-25 MED ORDER — ESCITALOPRAM OXALATE 20 MG PO TABS: 20.0000 mg | ORAL_TABLET | Freq: Every day | ORAL | 1 refills | Status: DC

## 2023-07-25 MED ORDER — LISINOPRIL 40 MG PO TABS: 40.0000 mg | ORAL_TABLET | Freq: Every day | ORAL | 0 refills | Status: DC

## 2023-07-25 MED ORDER — OMEPRAZOLE 40 MG PO CPDR: 40.0000 mg | DELAYED_RELEASE_CAPSULE | Freq: Two times a day (BID) | ORAL | 1 refills | Status: DC

## 2023-07-25 MED ORDER — TIRZEPATIDE 7.5 MG/0.5ML ~~LOC~~ SOAJ: 7.5000 mg | SUBCUTANEOUS | 0 refills | Status: DC

## 2023-07-25 MED ORDER — AMLODIPINE BESYLATE 2.5 MG PO TABS: 2.5000 mg | ORAL_TABLET | Freq: Every day | ORAL | 1 refills | Status: DC

## 2023-07-25 MED ORDER — SIMVASTATIN 40 MG PO TABS: 40.0000 mg | ORAL_TABLET | Freq: Every day | ORAL | 1 refills | Status: DC

## 2023-07-25 MED ORDER — BUSPIRONE HCL 5 MG PO TABS: 5.0000 mg | ORAL_TABLET | Freq: Two times a day (BID) | ORAL | 5 refills | Status: DC

## 2023-07-25 MED ORDER — METFORMIN HCL 1000 MG PO TABS: 1000.0000 mg | ORAL_TABLET | Freq: Two times a day (BID) | ORAL | 1 refills | Status: DC

## 2023-07-25 MED ORDER — TIRZEPATIDE 2.5 MG/0.5ML ~~LOC~~ SOAJ: 2.5000 mg | SUBCUTANEOUS | Status: DC

## 2023-07-25 MED ORDER — TIRZEPATIDE 5 MG/0.5ML ~~LOC~~ SOAJ: 5.0000 mg | SUBCUTANEOUS | 0 refills | Status: DC

## 2023-07-25 NOTE — Assessment & Plan Note (Signed)
Under good control on current regimen. Continue current regimen. Continue to monitor. Call with any concerns. Refills given. Labs drawn today.   

## 2023-07-25 NOTE — Assessment & Plan Note (Signed)
Stopped her ozempic. Will change to [facility] and recheck in about a month. Call with any concerns.

## 2023-07-25 NOTE — Assessment & Plan Note (Signed)
Not under good control. Will increase her lisinopril to 40mg  and recheck in 1 month. Call with any concerns.

## 2023-07-25 NOTE — Progress Notes (Signed)
BP (!) 175/84   Pulse 82   Ht 5' 1.75" (1.568 m)   Wt 215 lb (97.5 kg)   LMP 03/17/2013 (Approximate)   SpO2 98%   BMI 39.64 kg/m    Subjective:    Patient ID: Sierra Copeland, female    DOB: Sep 26, 1956, 67 y.o.   MRN: [id number]  HPI: Sierra Copeland is a 67 y.o. female  Chief Complaint  Patient presents with   Diabetes    Patient says she has since stopped the Ozempic, as she was noticing side effects of heart palpitations.    Hypertension   Hyperlipidemia   Depression   DIABETES- stopped the ozempic about a month ago because she was having palpations Hypoglycemic episodes:yes x2 Polydipsia/polyuria: no Visual disturbance: no Chest pain: no Paresthesias: no Glucose Monitoring: yes  Accucheck frequency: TID  Fasting glucose: 118  Post prandial: 250 Taking Insulin?: yes Blood Pressure Monitoring: not checking Retinal Examination: Up to Date Foot Exam: Up to Date Diabetic Education: Completed Pneumovax: Up to Date Influenza: Not up to Date Aspirin: no  HYPERTENSION / HYPERLIPIDEMIA Satisfied with current treatment? no Duration of hypertension: chronic BP monitoring frequency: not checking BP medication side effects: no Past BP meds: amlodipine, lisinopril Duration of hyperlipidemia: chronic Cholesterol medication side effects: no Cholesterol supplements: none Past cholesterol medications: simvastatin Medication compliance: excellent compliance Aspirin: yes Recent stressors: yes Recurrent headaches: no Visual changes: no Palpitations: no Dyspnea: no Chest pain: no Lower extremity edema: no Dizzy/lightheaded: no  DEPRESSION Mood status: OK- having issues with her husband's estate Satisfied with current treatment?: yes Symptom severity: moderate  Duration of current treatment : chronic Side effects: no Medication compliance: excellent compliance Psychotherapy/counseling: no  Previous psychiatric medications: lexapro, buspar Depressed mood:  yes Anxious mood: yes Anhedonia: no Significant weight loss or gain: no Insomnia: no  Fatigue: yes Feelings of worthlessness or guilt: no Impaired concentration/indecisiveness: no Suicidal ideations: no Hopelessness: no Crying spells: no    07/25/2023    1:16 PM 04/22/2023    1:27 PM 03/24/2023    1:24 PM 02/23/2023   11:05 AM 01/24/2023   10:25 AM  Depression screen PHQ 2/9  Decreased Interest 0 0 0 1 0  Down, Depressed, Hopeless 1 0 0 1 0  PHQ - 2 Score 1 0 0 2 0  Altered sleeping 0 0 0 1 0  Tired, decreased energy 1 0 0 1 0  Change in appetite 0 0 0 1 0  Feeling bad or failure about yourself  0 0 0 1 0  Trouble concentrating 0 0 0 0 0  Moving slowly or fidgety/restless 0 0 0 1 0  Suicidal thoughts 0 0 0 1 0  PHQ-9 Score 2 0 0 8 0  Difficult doing work/chores Not difficult at all  Not difficult at all Not difficult at all Not difficult at all     Relevant past medical, surgical, family and social history reviewed and updated as indicated. Interim medical history since our last visit reviewed. Allergies and medications reviewed and updated.  Review of Systems  Constitutional: Negative.   Respiratory: Negative.    Cardiovascular: Negative.   Gastrointestinal: Negative.   Musculoskeletal: Negative.   Neurological: Negative.   Psychiatric/Behavioral: Negative.      Per HPI unless specifically indicated above     Objective:    BP (!) 175/84   Pulse 82   Ht 5' 1.75" (1.568 m)   Wt 215 lb (97.5 kg)   LMP 03/17/2013 (Approximate)  SpO2 98%   BMI 39.64 kg/m   Wt Readings from Last 3 Encounters:  07/25/23 215 lb (97.5 kg)  04/22/23 214 lb 3.2 oz (97.2 kg)  03/24/23 215 lb 11.2 oz (97.8 kg)    Physical Exam Vitals and nursing note reviewed.  Constitutional:      General: She is not in acute distress.    Appearance: Normal appearance. She is obese. She is not ill-appearing, toxic-appearing or diaphoretic.  HENT:     Head: Normocephalic and atraumatic.      Right Ear: External ear normal.     Left Ear: External ear normal.     Nose: Nose normal.     Mouth/Throat:     Mouth: Mucous membranes are moist.     Pharynx: Oropharynx is clear.  Eyes:     General: No scleral icterus.       Right eye: No discharge.        Left eye: No discharge.     Extraocular Movements: Extraocular movements intact.     Conjunctiva/sclera: Conjunctivae normal.     Pupils: Pupils are equal, round, and reactive to light.  Cardiovascular:     Rate and Rhythm: Normal rate and regular rhythm.     Pulses: Normal pulses.     Heart sounds: Normal heart sounds. No murmur heard.    No friction rub. No gallop.  Pulmonary:     Effort: Pulmonary effort is normal. No respiratory distress.     Breath sounds: Normal breath sounds. No stridor. No wheezing, rhonchi or rales.  Chest:     Chest wall: No tenderness.  Musculoskeletal:        General: Normal range of motion.     Cervical back: Normal range of motion and neck supple.  Skin:    General: Skin is warm and dry.     Capillary Refill: Capillary refill takes less than 2 seconds.     Coloration: Skin is not jaundiced or pale.     Findings: No bruising, erythema, lesion or rash.  Neurological:     General: No focal deficit present.     Mental Status: She is alert and oriented to person, place, and time. Mental status is at baseline.  Psychiatric:        Mood and Affect: Mood normal.        Behavior: Behavior normal.        Thought Content: Thought content normal.        Judgment: Judgment normal.     Results for orders placed or performed in visit on 04/22/23  Bayer DCA Hb A1c Waived  Result Value Ref Range   HB A1C (BAYER DCA - WAIVED) 8.7 (H) 4.8 - 5.6 %      Assessment & Plan:   Problem List Items Addressed This Visit       Endocrine   Type 2 diabetes mellitus with obesity (HCC)    Stopped her ozempic. Will change to [facility] and recheck in about a month. Call with any concerns.       Relevant  Medications   lisinopril (ZESTRIL) 40 MG tablet   metFORMIN (GLUCOPHAGE) 1000 MG tablet   simvastatin (ZOCOR) 40 MG tablet   tirzepatide (MOUNJARO) 2.5 MG/0.5ML Pen   tirzepatide [facility]) 5 MG/0.5ML Pen   tirzepatide (MOUNJARO) 7.5 MG/0.5ML Pen (Start on 08/25/2023)   Other Relevant Orders   Bayer DCA Hb A1c Waived   CBC with Differential/Platelet   Comprehensive metabolic panel     Genitourinary  Benign hypertensive renal disease - Primary    Not under good control. Will increase her lisinopril to 40mg  and recheck in 1 month. Call with any concerns.       Relevant Orders   CBC with Differential/Platelet   Comprehensive metabolic panel     Other   Hypercholesteremia    Under good control on current regimen. Continue current regimen. Continue to monitor. Call with any concerns. Refills given. Labs drawn today.        Relevant Medications   amLODipine (NORVASC) 2.5 MG tablet   lisinopril (ZESTRIL) 40 MG tablet   simvastatin (ZOCOR) 40 MG tablet   Other Relevant Orders   CBC with Differential/Platelet   Comprehensive metabolic panel   Lipid Panel w/o Chol/HDL Ratio   Recurrent major depressive disorder, in full remission (HCC)    Under good control on current regimen. Continue current regimen. Continue to monitor. Call with any concerns. Refills given.        Relevant Medications   busPIRone (BUSPAR) 5 MG tablet   escitalopram (LEXAPRO) 20 MG tablet   Other Relevant Orders   CBC with Differential/Platelet   Comprehensive metabolic panel     Follow up plan: Return in about 4 weeks (around 08/22/2023) for before the end of September if possible.

## 2023-07-25 NOTE — Assessment & Plan Note (Signed)
Under good control on current regimen. Continue current regimen. Continue to monitor. Call with any concerns. Refills given.   

## 2023-07-26 LAB — CBC WITH DIFFERENTIAL/PLATELET
Basophils Absolute: 0 10*3/uL (ref 0.0–0.2)
Basos: 1 %
EOS (ABSOLUTE): 0.2 10*3/uL (ref 0.0–0.4)
Eos: 3 %
Hematocrit: 39.7 % (ref 34.0–46.6)
Hemoglobin: 12.8 g/dL (ref 11.1–15.9)
Immature Grans (Abs): 0 10*3/uL (ref 0.0–0.1)
Immature Granulocytes: 0 %
Lymphocytes Absolute: 1.6 10*3/uL (ref 0.7–3.1)
Lymphs: 25 %
MCH: 27.5 pg (ref 26.6–33.0)
MCHC: 32.2 g/dL (ref 31.5–35.7)
MCV: 85 fL (ref 79–97)
Monocytes Absolute: 0.5 10*3/uL (ref 0.1–0.9)
Monocytes: 8 %
Neutrophils Absolute: 4.1 10*3/uL (ref 1.4–7.0)
Neutrophils: 63 %
Platelets: 250 10*3/uL (ref 150–450)
RBC: 4.66 x10E6/uL (ref 3.77–5.28)
RDW: 13.6 % (ref 11.7–15.4)
WBC: 6.4 10*3/uL (ref 3.4–10.8)

## 2023-07-26 LAB — COMPREHENSIVE METABOLIC PANEL
ALT: 14 IU/L (ref 0–32)
AST: 17 IU/L (ref 0–40)
Albumin: 4.2 g/dL (ref 3.9–4.9)
Alkaline Phosphatase: 41 IU/L — ABNORMAL LOW (ref 44–121)
BUN/Creatinine Ratio: 16 (ref 12–28)
BUN: 17 mg/dL (ref 8–27)
Bilirubin Total: 0.3 mg/dL (ref 0.0–1.2)
CO2: 24 mmol/L (ref 20–29)
Calcium: 9.5 mg/dL (ref 8.7–10.3)
Chloride: 98 mmol/L (ref 96–106)
Creatinine, Ser: 1.04 mg/dL — ABNORMAL HIGH (ref 0.57–1.00)
Globulin, Total: 2.1 g/dL (ref 1.5–4.5)
Glucose: 223 mg/dL — ABNORMAL HIGH (ref 70–99)
Potassium: 4.5 mmol/L (ref 3.5–5.2)
Sodium: 138 mmol/L (ref 134–144)
Total Protein: 6.3 g/dL (ref 6.0–8.5)
eGFR: 60 mL/min/{1.73_m2} (ref >59)

## 2023-07-26 LAB — LIPID PANEL W/O CHOL/HDL RATIO
Cholesterol, Total: 149 mg/dL (ref 100–199)
HDL: 62 mg/dL (ref >39)
LDL Chol Calc (NIH): 59 mg/dL (ref 0–99)
Triglycerides: 172 mg/dL — ABNORMAL HIGH (ref 0–149)
VLDL Cholesterol Cal: 28 mg/dL (ref 5–40)

## 2023-09-06 ENCOUNTER — Ambulatory Visit: Payer: Medicare Other | Admitting: Family Medicine

## 2023-09-07 ENCOUNTER — Ambulatory Visit: Payer: Medicare Other | Admitting: Family Medicine

## 2023-09-07 ENCOUNTER — Encounter: Payer: Self-pay | Admitting: Family Medicine

## 2023-09-07 VITALS — BP 152/88 | HR 76 | Ht 61.75 in | Wt 212.2 lb

## 2023-09-07 DIAGNOSIS — Z7984 Long term (current) use of oral hypoglycemic drugs: Secondary | ICD-10-CM | POA: Diagnosis not present

## 2023-09-07 DIAGNOSIS — Z23 Encounter for immunization: Secondary | ICD-10-CM | POA: Diagnosis not present

## 2023-09-07 DIAGNOSIS — E1169 Type 2 diabetes mellitus with other specified complication: Secondary | ICD-10-CM | POA: Diagnosis not present

## 2023-09-07 DIAGNOSIS — R1013 Epigastric pain: Secondary | ICD-10-CM

## 2023-09-07 DIAGNOSIS — I129 Hypertensive chronic kidney disease with stage 1 through stage 4 chronic kidney disease, or unspecified chronic kidney disease: Secondary | ICD-10-CM

## 2023-09-07 DIAGNOSIS — E669 Obesity, unspecified: Secondary | ICD-10-CM

## 2023-09-07 MED ORDER — AMLODIPINE BESYLATE 5 MG PO TABS: 5.0000 mg | ORAL_TABLET | Freq: Every day | ORAL | 0 refills | Status: DC

## 2023-09-07 MED ORDER — RYBELSUS 3 MG PO TABS: 3.0000 mg | ORAL_TABLET | Freq: Every day | ORAL | Status: DC

## 2023-09-07 MED ORDER — RYBELSUS 7 MG PO TABS: 7.0000 mg | ORAL_TABLET | Freq: Every day | ORAL | 0 refills | Status: DC

## 2023-09-07 NOTE — Progress Notes (Unsigned)
BP (!) 152/88 (BP Location: Left Arm, Cuff Size: Normal)   Pulse 76   Ht 5' 1.75" (1.568 m)   Wt 212 lb 3.2 oz (96.3 kg)   LMP 03/17/2013 (Approximate)   SpO2 98%   BMI 39.13 kg/m    Subjective:    Patient ID: [name], female    DOB: 1956-08-25, 67 y.o.   MRN: [id number]  HPI: Sierra Copeland is a 67 y.o. female  Chief Complaint  Patient presents with   Hypertension   GI Problem    Patient says she recently noticed some upset stomach. Patient says it has been an ongoing issue, but seems to be getting worse here lately.   Ear Problem    Patient says she would like for her L ear to be checked, as it has not started to hurt yet, but she is noticing some issues with it.    DIABETES Hypoglycemic episodes:{Blank single:19197::"yes","no"} Polydipsia/polyuria: {Blank single:19197::"yes","no"} Visual disturbance: {Blank single:19197::"yes","no"} Chest pain: {Blank single:19197::"yes","no"} Paresthesias: {Blank single:19197::"yes","no"} Glucose Monitoring: {Blank single:19197::"yes","no"}  Accucheck frequency: {Blank single:19197::"Not Checking","Daily","BID","TID"}  Fasting glucose:  Post prandial:  Evening:  Before meals: Taking Insulin?: {Blank single:19197::"yes","no"}  Long acting insulin:  Short acting insulin: Blood Pressure Monitoring: {Blank single:19197::"not checking","rarely","daily","weekly","monthly","a few times a day","a few times a week","a few times a month"} Retinal Examination: {Blank single:19197::"Up to Date","Not up to Date"} Foot Exam: {Blank single:19197::"Up to Date","Not up to Date"} Diabetic Education: {Blank single:19197::"Completed","Not Completed"} Pneumovax: {Blank single:19197::"Up to Date","Not up to Date","unknown"} Influenza: {Blank single:19197::"Up to Date","Not up to Date","unknown"} Aspirin: {Blank single:19197::"yes","no"}  HYPERTENSION {Blank single:19197::"without","with"} Chronic Kidney Disease Hypertension status: {Blank  single:19197::"controlled","uncontrolled","better","worse","exacerbated","stable"}  Satisfied with current treatment? {Blank single:19197::"yes","no"} Duration of hypertension: {Blank single:19197::"chronic","months","years"} BP monitoring frequency:  {Blank single:19197::"not checking","rarely","daily","weekly","monthly","a few times a day","a few times a week","a few times a month"} BP range:  BP medication side effects:  {Blank single:19197::"yes","no"} Medication compliance: {Blank single:19197::"excellent compliance","good compliance","fair compliance","poor compliance"} Previous BP meds:{Blank multiple:19196::"none","amlodipine","amlodipine/benazepril","atenolol","benazepril","benazepril/HCTZ","bisoprolol (bystolic)","carvedilol","chlorthalidone","clonidine","diltiazem","exforge HCT","HCTZ","irbesartan (avapro)","labetalol","lisinopril","lisinopril-HCTZ","losartan (cozaar)","methyldopa","nifedipine","olmesartan (benicar)","olmesartan-HCTZ","quinapril","ramipril","spironalactone","tekturna","valsartan","valsartan-HCTZ","verapamil"} Aspirin: {Blank single:19197::"yes","no"} Recurrent headaches: {Blank single:19197::"yes","no"} Visual changes: {Blank single:19197::"yes","no"} Palpitations: {Blank single:19197::"yes","no"} Dyspnea: {Blank single:19197::"yes","no"} Chest pain: {Blank single:19197::"yes","no"} Lower extremity edema: {Blank single:19197::"yes","no"} Dizzy/lightheaded: {Blank single:19197::"yes","no"}   Relevant past medical, surgical, family and social history reviewed and updated as indicated. Interim medical history since our last visit reviewed. Allergies and medications reviewed and updated.  Review of Systems  Constitutional: Negative.   Respiratory: Negative.    Cardiovascular: Negative.   Gastrointestinal:  Positive for diarrhea and nausea. Negative for abdominal distention, abdominal pain, anal bleeding, blood in stool, constipation, rectal pain and vomiting.   Musculoskeletal: Negative.   Skin: Negative.   Neurological: Negative.   Psychiatric/Behavioral:  Positive for dysphoric mood. Negative for agitation, behavioral problems, confusion, decreased concentration, hallucinations, self-injury, sleep disturbance and suicidal ideas. The patient is nervous/anxious. The patient is not hyperactive.     Per HPI unless specifically indicated above     Objective:    BP (!) 152/88 (BP Location: Left Arm, Cuff Size: Normal)   Pulse 76   Ht 5' 1.75" (1.568 m)   Wt 212 lb 3.2 oz (96.3 kg)   LMP 03/17/2013 (Approximate)   SpO2 98%   BMI 39.13 kg/m   Wt Readings from Last 3 Encounters:  09/07/23 212 lb 3.2 oz (96.3 kg)  07/25/23 215 lb (97.5 kg)  04/22/23 214 lb 3.2 oz (97.2 kg)    Physical Exam Vitals and nursing note reviewed.  Constitutional:      General: She is not in acute distress.  Appearance: Normal appearance. She is obese. She is not ill-appearing, toxic-appearing or diaphoretic.  HENT:     Head: Normocephalic and atraumatic.     Right Ear: External ear normal.     Left Ear: External ear normal.     Nose: Nose normal.     Mouth/Throat:     Mouth: Mucous membranes are moist.     Pharynx: Oropharynx is clear.  Eyes:     General: No scleral icterus.       Right eye: No discharge.        Left eye: No discharge.     Extraocular Movements: Extraocular movements intact.     Conjunctiva/sclera: Conjunctivae normal.     Pupils: Pupils are equal, round, and reactive to light.  Cardiovascular:     Rate and Rhythm: Normal rate and regular rhythm.     Pulses: Normal pulses.     Heart sounds: Normal heart sounds. No murmur heard.    No friction rub. No gallop.  Pulmonary:     Effort: Pulmonary effort is normal. No respiratory distress.     Breath sounds: Normal breath sounds. No stridor. No wheezing, rhonchi or rales.  Chest:     Chest wall: No tenderness.  Musculoskeletal:        General: Normal range of motion.     Cervical  back: Normal range of motion and neck supple.  Skin:    General: Skin is warm and dry.     Capillary Refill: Capillary refill takes less than 2 seconds.     Coloration: Skin is not jaundiced or pale.     Findings: No bruising, erythema, lesion or rash.  Neurological:     General: No focal deficit present.     Mental Status: She is alert and oriented to person, place, and time. Mental status is at baseline.  Psychiatric:        Mood and Affect: Mood normal.        Behavior: Behavior normal.        Thought Content: Thought content normal.        Judgment: Judgment normal.     Results for orders placed or performed in visit on 07/25/23  Bayer DCA Hb A1c Waived  Result Value Ref Range   HB A1C (BAYER DCA - WAIVED) 8.6 (H) 4.8 - 5.6 %  CBC with Differential/Platelet  Result Value Ref Range   WBC 6.4 3.4 - 10.8 x10E3/uL   RBC 4.66 3.77 - 5.28 x10E6/uL   Hemoglobin 12.8 11.1 - 15.9 g/dL   Hematocrit 08.6 57.8 - 46.6 %   MCV 85 79 - 97 fL   MCH 27.5 26.6 - 33.0 pg   MCHC 32.2 31.5 - 35.7 g/dL   RDW 46.9 62.9 - 52.8 %   Platelets 250 150 - 450 x10E3/uL   Neutrophils 63 Not Estab. %   Lymphs 25 Not Estab. %   Monocytes 8 Not Estab. %   Eos 3 Not Estab. %   Basos 1 Not Estab. %   Neutrophils Absolute 4.1 1.4 - 7.0 x10E3/uL   Lymphocytes Absolute 1.6 0.7 - 3.1 x10E3/uL   Monocytes Absolute 0.5 0.1 - 0.9 x10E3/uL   EOS (ABSOLUTE) 0.2 0.0 - 0.4 x10E3/uL   Basophils Absolute 0.0 0.0 - 0.2 x10E3/uL   Immature Granulocytes 0 Not Estab. %   Immature Grans (Abs) 0.0 0.0 - 0.1 x10E3/uL  Comprehensive metabolic panel  Result Value Ref Range   Glucose 223 (H) 70 - 99 mg/dL  BUN 17 8 - 27 mg/dL   Creatinine, Ser 4.78 (H) 0.57 - 1.00 mg/dL   eGFR 60 >29 FA/OZH/0.86   BUN/Creatinine Ratio 16 12 - 28   Sodium 138 134 - 144 mmol/L   Potassium 4.5 3.5 - 5.2 mmol/L   Chloride 98 96 - 106 mmol/L   CO2 24 20 - 29 mmol/L   Calcium 9.5 8.7 - 10.3 mg/dL   Total Protein 6.3 6.0 - 8.5 g/dL    Albumin 4.2 3.9 - 4.9 g/dL   Globulin, Total 2.1 1.5 - 4.5 g/dL   Bilirubin Total 0.3 0.0 - 1.2 mg/dL   Alkaline Phosphatase 41 (L) 44 - 121 IU/L   AST 17 0 - 40 IU/L   ALT 14 0 - 32 IU/L  Lipid Panel w/o Chol/HDL Ratio  Result Value Ref Range   Cholesterol, Total 149 100 - 199 mg/dL   Triglycerides 578 (H) 0 - 149 mg/dL   HDL 62 >46 mg/dL   VLDL Cholesterol Cal 28 5 - 40 mg/dL   LDL Chol Calc (NIH) 59 0 - 99 mg/dL      Assessment & Plan:   Problem List Items Addressed This Visit       Endocrine   Type 2 diabetes mellitus with obesity (HCC) - Primary   Relevant Medications   Semaglutide (RYBELSUS) 3 MG TABS   Semaglutide (RYBELSUS) 7 MG TABS   Other Relevant Orders   Basic metabolic panel     Genitourinary   Benign hypertensive renal disease   Relevant Orders   Basic metabolic panel   Other Visit Diagnoses     Needs flu shot       Relevant Orders   Flu Vaccine Trivalent High Dose (Fluad) (Completed)        Follow up plan: No follow-ups on file.

## 2023-09-08 ENCOUNTER — Encounter: Payer: Self-pay | Admitting: Family Medicine

## 2023-09-08 LAB — BASIC METABOLIC PANEL
BUN/Creatinine Ratio: 17 (ref 12–28)
BUN: 17 mg/dL (ref 8–27)
CO2: 21 mmol/L (ref 20–29)
Calcium: 10 mg/dL (ref 8.7–10.3)
Chloride: 100 mmol/L (ref 96–106)
Creatinine, Ser: 0.98 mg/dL (ref 0.57–1.00)
Glucose: 181 mg/dL — ABNORMAL HIGH (ref 70–99)
Potassium: 4.6 mmol/L (ref 3.5–5.2)
Sodium: 141 mmol/L (ref 134–144)
eGFR: 64 mL/min/{1.73_m2} (ref >59)

## 2023-09-08 NOTE — Assessment & Plan Note (Signed)
Running high. Will increase her amlodpine to 5mg  and recheck in 1 month. Call with any concerns.

## 2023-09-12 DIAGNOSIS — M1711 Unilateral primary osteoarthritis, right knee: Secondary | ICD-10-CM | POA: Diagnosis not present

## 2023-09-20 DIAGNOSIS — M9903 Segmental and somatic dysfunction of lumbar region: Secondary | ICD-10-CM | POA: Diagnosis not present

## 2023-09-20 DIAGNOSIS — M5412 Radiculopathy, cervical region: Secondary | ICD-10-CM | POA: Diagnosis not present

## 2023-09-20 DIAGNOSIS — M9901 Segmental and somatic dysfunction of cervical region: Secondary | ICD-10-CM | POA: Diagnosis not present

## 2023-09-20 DIAGNOSIS — M5432 Sciatica, left side: Secondary | ICD-10-CM | POA: Diagnosis not present

## 2023-09-28 ENCOUNTER — Other Ambulatory Visit: Payer: Self-pay | Admitting: Family Medicine

## 2023-09-28 DIAGNOSIS — Z7960 Long term (current) use of unspecified immunomodulators and immunosuppressants: Secondary | ICD-10-CM | POA: Diagnosis not present

## 2023-09-28 DIAGNOSIS — M51369 Other intervertebral disc degeneration, lumbar region without mention of lumbar back pain or lower extremity pain: Secondary | ICD-10-CM | POA: Diagnosis not present

## 2023-09-28 DIAGNOSIS — M059 Rheumatoid arthritis with rheumatoid factor, unspecified: Secondary | ICD-10-CM | POA: Diagnosis not present

## 2023-09-29 ENCOUNTER — Other Ambulatory Visit: Payer: Disability Insurance

## 2023-09-29 NOTE — Telephone Encounter (Signed)
Labs in date  Requested Prescriptions  Pending Prescriptions Disp Refills   insulin lispro (HUMALOG KWIKPEN) 100 UNIT/ML KwikPen [Pharmacy Med Name: HUMALOG KWIKPEN 100 UNIT/ML SUBQ SO] 54 mL 1    Sig: INJECT 10-20 UNITS SUBCUTANEOUSLY WITH BREAKFAST  WITH LUNCH AND DINNER     Endocrinology:  Diabetes - Insulins Failed - 09/28/2023 11:29 AM      Failed - HBA1C is between 0 and 7.9 and within 180 days    Hemoglobin A1C  Date Value Ref Range Status  01/30/2020 9.6  Final   HB A1C (BAYER DCA - WAIVED)  Date Value Ref Range Status  07/25/2023 8.6 (H) 4.8 - 5.6 % Final    Comment:             Prediabetes: 5.7 - 6.4          Diabetes: >6.4          Glycemic control for adults with diabetes: <7.0          Passed - Valid encounter within last 6 months    Recent Outpatient Visits           3 weeks ago Type 2 diabetes mellitus with obesity (HCC)   [place] [facility] [place], Megan P, DO   2 months ago Benign hypertensive renal disease   [place] [facility] [place], Megan P, DO   5 months ago Type 2 diabetes mellitus with hyperglycemia, with long-term current use of insulin (HCC)   [place] [facility] [place], Megan P, DO   6 months ago Type 2 diabetes mellitus with hyperglycemia, with long-term current use of insulin (HCC)   [place] [facility] [place], Megan P, DO   7 months ago Type 2 diabetes mellitus with hyperglycemia, with long-term current use of insulin [facility])   [place] [facility] [place], Oralia Rud, DO       Future Appointments             In 2 weeks Laural Benes, Oralia Rud, DO [place] [facility]

## 2023-10-18 ENCOUNTER — Other Ambulatory Visit: Payer: Self-pay | Admitting: Family Medicine

## 2023-10-19 ENCOUNTER — Ambulatory Visit: Payer: Medicare Other | Admitting: Family Medicine

## 2023-10-19 NOTE — Telephone Encounter (Signed)
Requested Prescriptions  Pending Prescriptions Disp Refills   lisinopril (ZESTRIL) 40 MG tablet [Pharmacy Med Name: LISINOPRIL 40 MG TAB] 90 tablet 0    Sig: TAKE 1 TABLET BY MOUTH ONCE DAILY     Cardiovascular:  ACE Inhibitors Failed - 10/18/2023 11:13 AM      Failed - Last BP in normal range    BP Readings from Last 1 Encounters:  09/07/23 (!) 152/88         Passed - Cr in normal range and within 180 days    Creatinine, Ser  Date Value Ref Range Status  09/07/2023 0.98 0.57 - 1.00 mg/dL Final         Passed - K in normal range and within 180 days    Potassium  Date Value Ref Range Status  09/07/2023 4.6 3.5 - 5.2 mmol/L Final         Passed - Patient is not pregnant      Passed - Valid encounter within last 6 months    Recent Outpatient Visits           1 month ago Type 2 diabetes mellitus with obesity (HCC)   [place] [place], Megan P, DO   2 months ago Benign hypertensive renal disease   [place] [facility] [place], Megan P, DO   6 months ago Type 2 diabetes mellitus with hyperglycemia, with long-term current use of insulin (HCC)   [place] [facility] [place], Megan P, DO   6 months ago Type 2 diabetes mellitus with hyperglycemia, with long-term current use of insulin (HCC)   [place] [facility] [place], Megan P, DO   7 months ago Type 2 diabetes mellitus with hyperglycemia, with long-term current use of insulin [facility])   [place] [facility] [place], Oralia Rud, DO       Future Appointments             In 2 weeks Laural Benes, Oralia Rud, DO [place] [facility], [facility]

## 2023-10-20 ENCOUNTER — Ambulatory Visit
Admission: RE | Admit: 2023-10-20 | Discharge: 2023-10-20 | Disposition: A | Discharge status: Home / Self Care | Payer: Medicare Other | Source: Ambulatory Visit | Attending: Family Medicine | Admitting: Family Medicine

## 2023-10-20 DIAGNOSIS — Z1231 Encounter for screening mammogram for malignant neoplasm of breast: Secondary | ICD-10-CM | POA: Diagnosis not present

## 2023-11-02 ENCOUNTER — Encounter: Payer: Self-pay | Admitting: Emergency Medicine

## 2023-11-02 ENCOUNTER — Encounter: Payer: Self-pay | Admitting: Family Medicine

## 2023-11-03 ENCOUNTER — Ambulatory Visit: Payer: Medicare Other | Admitting: Family Medicine

## 2023-11-03 NOTE — Telephone Encounter (Signed)
Pt already has an appointment on 11/08/2023 @ 8:20 am.

## 2023-11-07 NOTE — Progress Notes (Unsigned)
LMP 03/17/2013 (Approximate)    Subjective:    Patient ID: [name], female    DOB: 07-06-1956, 67 y.o.   MRN: [id number]  HPI: Sierra Copeland is a 67 y.o. female presenting on 11/08/2023 for comprehensive medical examination. Current medical complaints include:  DIABETES Hypoglycemic episodes:{Blank single:19197::"yes","no"} Polydipsia/polyuria: {Blank single:19197::"yes","no"} Visual disturbance: {Blank single:19197::"yes","no"} Chest pain: {Blank single:19197::"yes","no"} Paresthesias: {Blank single:19197::"yes","no"} Glucose Monitoring: {Blank single:19197::"yes","no"}  Accucheck frequency: {Blank single:19197::"Not Checking","Daily","BID","TID"}  Fasting glucose:  Post prandial:  Evening:  Before meals: Taking Insulin?: {Blank single:19197::"yes","no"}  Long acting insulin:  Short acting insulin: Blood Pressure Monitoring: {Blank single:19197::"not checking","rarely","daily","weekly","monthly","a few times a day","a few times a week","a few times a month"} Retinal Examination: {Blank single:19197::"Up to Date","Not up to Date"} Foot Exam: {Blank single:19197::"Up to Date","Not up to Date"} Diabetic Education: {Blank single:19197::"Completed","Not Completed"} Pneumovax: {Blank single:19197::"Up to Date","Not up to Date","unknown"} Influenza: {Blank single:19197::"Up to Date","Not up to Date","unknown"} Aspirin: {Blank single:19197::"yes","no"}   She currently lives with: Menopausal Symptoms: {Blank single:19197::"yes","no"}  Functional Status Survey:       09/07/2023    9:05 AM 07/25/2023    1:16 PM 04/22/2023    1:27 PM 03/24/2023    1:24 PM 01/24/2023   10:25 AM  Fall Risk   Falls in the past year? 0 0 1 0 0  Number falls in past yr: 0 0 1 0 0  Injury with Fall? 0 0 0 0 0  Risk for fall due to : No Fall Risks No Fall Risks No Fall Risks No Fall Risks No Fall Risks  Follow up Falls evaluation completed Falls evaluation completed Falls evaluation completed Falls  evaluation completed Falls evaluation completed    Depression Screen    09/07/2023    9:05 AM 07/25/2023    1:16 PM 04/22/2023    1:27 PM 03/24/2023    1:24 PM 02/23/2023   11:05 AM  Depression screen PHQ 2/9  Decreased Interest 0 0 0 0 1  Down, Depressed, Hopeless 0 1 0 0 1  PHQ - 2 Score 0 1 0 0 2  Altered sleeping 0 0 0 0 1  Tired, decreased energy 0 1 0 0 1  Change in appetite 0 0 0 0 1  Feeling bad or failure about yourself  0 0 0 0 1  Trouble concentrating 0 0 0 0 0  Moving slowly or fidgety/restless 0 0 0 0 1  Suicidal thoughts 0 0 0 0 1  PHQ-9 Score 0 2 0 0 8  Difficult doing work/chores Not difficult at all Not difficult at all  Not difficult at all Not difficult at all     Advanced Directives Does patient have a HCPOA?    {Blank single:19197::"yes","no"} If yes, name and contact information:  Does patient have a living will or MOST form?  {Blank single:19197::"yes","no"}  Past Medical History:  Past Medical History:  Diagnosis Date   Abnormal mammogram    Anxiety    Arthritis    Back pain    spinal stenosis and neck bone spur   Cancer (HCC)    Breast   Car sickness    Collagenous colitis    Depression    Diabetes mellitus without complication (HCC)    GERD (gastroesophageal reflux disease)    occ-no meds   Hair loss    TAKING THYROTAIN   Headache    migraines   Hyperlipemia    Hypertension    Osteoarthritis    seen by Rheum, not enoug evidence to support  RA   Pinched nerve    PONV (postoperative nausea and vomiting)    after tonsillectomy   Rosacea    Seborrheic dermatitis    Sleep apnea    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 Tumor is triple negative. She is dated radiation in August 2018. Continues to have problems with lymphedema   Wears glasses     Surgical History:  Past Surgical History:  Procedure Laterality Date   ACHILLES TENDON REPAIR  2007   right   BREAST LUMPECTOMY  2018   BREAST  LUMPECTOMY Left 03/2017   BREAST SURGERY  2011   lt br bx-non cancer   CARPAL TUNNEL RELEASE  1989   right and left   CHOLECYSTECTOMY N/A 09/06/2017   Gallbladder not removed due to complications. Procedure: LAPAROSCOPY;  Surgeon: Nadeen Landau, MD;  Location: [facility];  Service: General;  Laterality: N/A;   COLONOSCOPY     COLONOSCOPY WITH PROPOFOL N/A 09/24/2021   Procedure: COLONOSCOPY WITH PROPOFOL;  Surgeon: Jaynie Collins, DO;  Location: [facility];  Service: Gastroenterology;  Laterality: N/A;  IDDM   epidural steroid injection x2  2017   ESOPHAGOGASTRODUODENOSCOPY N/A 01/21/2016   Procedure: ESOPHAGOGASTRODUODENOSCOPY (EGD);  Surgeon: Wallace Cullens, MD;  [facility];  Service: Gastroenterology;  Laterality: N/A;  Please call at work [phone] Diabetic - insulin   ESOPHAGOGASTRODUODENOSCOPY N/A 09/24/2021   Procedure: ESOPHAGOGASTRODUODENOSCOPY (EGD);  Surgeon: Jaynie Collins, DO;  Location: [facility];  Service: Gastroenterology;  Laterality: N/A;   FINGER EXPLORATION  2013   left long    Lynch's Syndrome     MASTECTOMY Left 2018   Partial   NERVE EXPLORATION Left 10/03/2013   Procedure: NERVE EXPLORATION/ LEFT LONG RADIAL DISTAL NERVE neurolysis;  Surgeon: Marlowe Shores, MD;  Location: [facility];  Service: Orthopedics;  Laterality: Left;   SHOULDER ARTHROSCOPY WITH DEBRIDEMENT AND BICEP TENDON REPAIR Right 05/13/2016   Procedure: Arthroscopic debridement, open decompression, rotator cuff repair, tenodesis,right shoulder.;  Surgeon: Christena Flake, MD;  Location: [facility];  Service: Orthopedics;  Laterality: Right;   TONSILLECTOMY  2000   UVULOPALATOPHARYNGOPLASTY  1998   and tonsils-tongue pexi    BREAST BIOPSY Left 03/14/2017       Medications:  Current Outpatient Medications on File Prior to Visit  Medication Sig   acetaminophen (TYLENOL) 650 MG CR tablet Take 650 mg by mouth every 8 (eight) hours as needed  for pain.   amLODipine (NORVASC) 5 MG tablet Take 1 tablet (5 mg total) by mouth daily.   aspirin-acetaminophen-caffeine (EXCEDRIN MIGRAINE) 250-250-65 MG tablet Take 1 tablet by mouth every 6 (six) hours as needed for headache.   busPIRone (BUSPAR) 5 MG tablet Take 1 tablet (5 mg total) by mouth 2 (two) times daily.   Cholecalciferol (VITAMIN D3) 2000 units capsule Take 2,000 Units by mouth at bedtime.    Coenzyme Q10 (COQ-10) 100 MG CAPS Take 100 mg by mouth daily.   Continuous Blood Gluc Receiver (FREESTYLE LIBRE 3 READER) DEVI 1 kit by Does not apply route daily.   Continuous Glucose Sensor (FREESTYLE LIBRE 3 SENSOR) MISC USE 1 SENSOR ON THE SKIN EVERY 14 DAYS. USE TO CHECK BLOOD GLUCOSE CONTINUOUSLY   escitalopram (LEXAPRO) 20 MG tablet Take 1 tablet (20 mg total) by mouth daily.   fexofenadine (ALLEGRA) 180 MG tablet Take 180 mg by mouth daily with breakfast.    fluticasone (FLONASE) 50 MCG/ACT  nasal spray Place 1-2 sprays into both nostrils every evening.    gabapentin (NEURONTIN) 100 MG capsule Take 2 capsules (200 mg total) by mouth 2 (two) times daily.   Glucagon (GVOKE HYPOPEN 2-PACK) 1 MG/0.2ML SOAJ Inject 1 each into the skin as needed.   Hyoscyamine Sulfate SL 0.125 MG SUBL Place 0.125 mg under the tongue daily as needed (cramping).   insulin glargine, 1 Unit Dial, (TOUJEO SOLOSTAR) 300 UNIT/ML Solostar Pen 50-60 units at bedtime   insulin lispro (HUMALOG KWIKPEN) 100 UNIT/ML KwikPen INJECT 10-20 UNITS SUBCUTANEOUSLY WITH BREAKFAST  WITH LUNCH AND DINNER   Insulin Syringe-Needle U-100 30G X 5/16" 0.3 ML MISC Inject 1 each into the skin 3 (three) times daily.   lisinopril (ZESTRIL) 40 MG tablet TAKE 1 TABLET BY MOUTH ONCE DAILY   Magnesium 250 MG TABS Take 250 mg by mouth daily.   metFORMIN (GLUCOPHAGE) 1000 MG tablet Take 1 tablet (1,000 mg total) by mouth 2 (two) times daily with a meal.   omeprazole (PRILOSEC) 40 MG capsule Take 1 capsule (40 mg total) by mouth 2 (two) times  daily.   ondansetron (ZOFRAN) 4 MG tablet Take 1 tablet (4 mg total) by mouth every 8 (eight) hours as needed for nausea or vomiting.   Semaglutide (RYBELSUS) 3 MG TABS Take 1 tablet (3 mg total) by mouth daily.   Semaglutide (RYBELSUS) 7 MG TABS Take 1 tablet (7 mg total) by mouth daily.   simvastatin (ZOCOR) 40 MG tablet Take 1 tablet (40 mg total) by mouth at bedtime.   tiZANidine (ZANAFLEX) 2 MG tablet Take 2 mg by mouth at bedtime.    traMADol (ULTRAM) 50 MG tablet Take 100 mg by mouth 2 (two) times daily.   ULTRACARE PEN NEEDLES 33G X 4 MM MISC Inject 1 each into the skin 2 (two) times daily.   zinc gluconate 50 MG tablet Take 50 mg by mouth daily.   Current Facility-Administered Medications on File Prior to Visit  Medication   triamcinolone acetonide (KENALOG-40) injection 40 mg    Allergies:  Allergies  Allergen Reactions   Mounjaro [Tirzepatide]     GI upset   Ozempic (0.25 Or 0.5 Mg-Dose) [Semaglutide(0.25 Or 0.5mg -Dos)] Other (See Comments)    GI upset   Prednisone Palpitations    Increases BG and HR.    Social History:  Social History   Socioeconomic History   Marital status: Widowed    Spouse name: Not on file   Number of children: Not on file   Years of education: Not on file   Highest education level: Some college, no degree  Occupational History   Not on file  Tobacco Use   Smoking status: Former    Current packs/day: 0.00    Average packs/day: 1 pack/day for 10.0 years (10.0 ttl pk-yrs)    Types: Cigarettes    Start date: 09/28/1976    Quit date: 09/28/1986    Years since quitting: 37.1   Smokeless tobacco: Never  Vaping Use   Vaping status: Never Used  Substance and Sexual Activity   Alcohol use: No   Drug use: No   Sexual activity: Yes  Other Topics Concern   Not on file  Social History Narrative   Not on file   Social Determinants of Health   Financial Resource Strain: Low Risk  (11/02/2023)   Overall Financial Resource Strain (CARDIA)     Difficulty of Paying Living Expenses: Not hard at all  Food Insecurity: No Food Insecurity (11/02/2023)  Hunger Vital Sign    Worried About Running Out of Food in the Last Year: Never true    Ran Out of Food in the Last Year: Never true  Transportation Needs: No Transportation Needs (11/02/2023)   PRAPARE - Administrator, Civil Service (Medical): No    Lack of Transportation (Non-Medical): No  Physical Activity: Inactive (11/02/2023)   Exercise Vital Sign    Days of Exercise per Week: 0 days    Minutes of Exercise per Session: 30 min  Stress: No Stress Concern Present (11/02/2023)   [employer] of Occupational Health - Occupational Stress Questionnaire    Feeling of Stress : Only a little  Social Connections: Moderately Isolated (11/02/2023)   Social Connection and Isolation Panel [NHANES]    Frequency of Communication with Friends and Family: Once a week    Frequency of Social Gatherings with Friends and Family: Once a week    Attends Religious Services: More than 4 times per year    Active Member of [employer] or Organizations: Yes    Attends Banker Meetings: More than 4 times per year    Marital Status: Widowed  Intimate Partner Violence: Not At Risk (09/01/2022)   Humiliation, Afraid, Rape, and Kick questionnaire    Fear of Current or Ex-Partner: No    Emotionally Abused: No    Physically Abused: No    Sexually Abused: No   Social History   Tobacco Use  Smoking Status Former   Current packs/day: 0.00   Average packs/day: 1 pack/day for 10.0 years (10.0 ttl pk-yrs)   Types: Cigarettes   Start date: 09/28/1976   Quit date: 09/28/1986   Years since quitting: 37.1  Smokeless Tobacco Never   Social History   Substance and Sexual Activity  Alcohol Use No    Family History:  Family History  Problem Relation Age of Onset   Liver cancer Mother 84       gallbaldder   Hypertension Mother    Hyperlipidemia Mother    Hyperlipidemia Father     Hypertension Father    Diabetes Maternal Aunt    Diabetes Maternal Uncle    Heart disease Maternal Uncle    Breast cancer Paternal Aunt    Diabetes Paternal Aunt    Breast cancer Paternal Aunt    Diabetes Paternal Uncle    Dementia Maternal Grandmother    Stroke Maternal Grandfather    Diabetes Paternal Grandmother    Heart disease Paternal Grandfather    Other Cousin 77       BRCA or similar mutation positive    Past medical history, surgical history, medications, allergies, family history and social history reviewed with patient today and changes made to appropriate areas of the chart.   ROS  All other ROS negative except what is listed above and in the HPI.      Objective:    LMP 03/17/2013 (Approximate)   Wt Readings from Last 3 Encounters:  09/07/23 212 lb 3.2 oz (96.3 kg)  07/25/23 215 lb (97.5 kg)  04/22/23 214 lb 3.2 oz (97.2 kg)    No results found.  Physical Exam     08/28/2021    5:25 PM  6CIT Screen  What Year? 0 points  What month? 0 points  What time? 0 points  Count back from 20 0 points  Months in reverse 0 points  Repeat phrase 0 points  Total Score 0 points    Cognitive Testing - 6-CIT  Correct?  Score   What year is it? {YES NO:22349} {Numbers; 0-4:31231} Yes = 0    No = 4  What month is it? {YES NO:22349} {Numbers; 0-4:31231} Yes = 0    No = 3  Remember:     Floyde Parkins, [address], [place]     What time is it? {YES NO:22349} {Numbers; 0-4:31231} Yes = 0    No = 3  Count backwards from 20 to 1 {YES NO:22349} {Numbers; 0-4:31231} Correct = 0    1 error = 2   More than 1 error = 4  Say the months of the year in reverse. {YES NO:22349} {Numbers; 0-4:31231} Correct = 0    1 error = 2   More than 1 error = 4  What address did I ask you to remember? {YES NO:22349} {NUMBERS; 0-10:5044} Correct = 0  1 error = 2    2 error = 4    3 error = 6    4 error = 8    All wrong = 10       TOTAL SCORE  {Numbers; 1-61:09604}/54   Interpretation:  {Desc;  normal/abnormal:11317::"Normal"}  Normal (0-7) Abnormal (8-28)   Results for orders placed or performed in visit on 09/07/23  Basic metabolic panel  Result Value Ref Range   Glucose 181 (H) 70 - 99 mg/dL   BUN 17 8 - 27 mg/dL   Creatinine, Ser 0.98 0.57 - 1.00 mg/dL   eGFR 64 >11 BJ/YNW/2.95   BUN/Creatinine Ratio 17 12 - 28   Sodium 141 134 - 144 mmol/L   Potassium 4.6 3.5 - 5.2 mmol/L   Chloride 100 96 - 106 mmol/L   CO2 21 20 - 29 mmol/L   Calcium 10.0 8.7 - 10.3 mg/dL      Assessment & Plan:   Problem List Items Addressed This Visit       Endocrine   Type 2 diabetes mellitus with obesity (HCC)     Other   Morbid obesity (HCC)   Other Visit Diagnoses     Encounter for Medicare annual wellness exam    -  Primary        Preventative Services:  AAA screening:  Health Risk Assessment and Personalized Prevention Plan: Bone Mass Measurements: Breast Cancer Screening: CVD Screening:  Cervical Cancer Screening: Colon Cancer Screening:  Depression Screening:  Diabetes Screening:  Glaucoma Screening:  Hepatitis B vaccine: Hepatitis C screening:  HIV Screening: Flu Vaccine: Lung cancer Screening: Obesity Screening:  Pneumonia Vaccines (2): STI Screening:  Follow up plan: No follow-ups on file.   LABORATORY TESTING:  - Pap smear: {Blank single:19197::"pap done","not applicable","up to date","done elsewhere"}  IMMUNIZATIONS:   - Tdap: Tetanus vaccination status reviewed: {tetanus status:315746}. - Influenza: {Blank single:19197::"Up to date","Administered today","Postponed to flu season","Refused","Given elsewhere"} - Pneumovax: {Blank single:19197::"Up to date","Administered today","Not applicable","Refused","Given elsewhere"} - Prevnar: {Blank single:19197::"Up to date","Administered today","Not applicable","Refused","Given elsewhere"} - Zostavax vaccine: {Blank single:19197::"Up to date","Administered today","Not applicable","Refused","Given  elsewhere"}  SCREENING: -Mammogram: {Blank single:19197::"Up to date","Ordered today","Not applicable","Refused","Done elsewhere"}  - Colonoscopy: {Blank single:19197::"Up to date","Ordered today","Not applicable","Refused","Done elsewhere"}  - Bone Density: {Blank single:19197::"Up to date","Ordered today","Not applicable","Refused","Done elsewhere"}  -Hearing Test: {Blank single:19197::"Up to date","Ordered today","Not applicable","Refused","Done elsewhere"}  -Spirometry: {Blank single:19197::"Up to date","Ordered today","Not applicable","Refused","Done elsewhere"}   PATIENT COUNSELING:   Advised to take 1 mg of folate supplement per day if capable of pregnancy.   Sexuality: Discussed sexually transmitted diseases, partner selection, use of condoms, avoidance of unintended pregnancy  and contraceptive alternatives.  Advised to avoid cigarette smoking.  I discussed with the patient that most people either abstain from alcohol or drink within safe limits (<=14/week and <=4 drinks/occasion for males, <=7/weeks and <= 3 drinks/occasion for females) and that the risk for alcohol disorders and other health effects rises proportionally with the number of drinks per week and how often a drinker exceeds daily limits.  Discussed cessation/primary prevention of drug use and availability of treatment for abuse.   Diet: Encouraged to adjust caloric intake to maintain  or achieve ideal body weight, to reduce intake of dietary saturated fat and total fat, to limit sodium intake by avoiding high sodium foods and not adding table salt, and to maintain adequate dietary potassium and calcium preferably from fresh fruits, vegetables, and low-fat dairy products.    stressed the importance of regular exercise  Injury prevention: Discussed safety belts, safety helmets, smoke detector, smoking near bedding or upholstery.   Dental health: Discussed importance of regular tooth brushing, flossing, and dental  visits.    NEXT PREVENTATIVE PHYSICAL DUE IN 1 YEAR. No follow-ups on file.

## 2023-11-08 ENCOUNTER — Ambulatory Visit (INDEPENDENT_AMBULATORY_CARE_PROVIDER_SITE_OTHER): Payer: Medicare Other | Admitting: Family Medicine

## 2023-11-08 ENCOUNTER — Encounter: Payer: Self-pay | Admitting: Family Medicine

## 2023-11-08 VITALS — BP 150/78 | HR 89 | Ht 61.75 in | Wt 211.4 lb

## 2023-11-08 DIAGNOSIS — Z0000 Encounter for general adult medical examination without abnormal findings: Secondary | ICD-10-CM | POA: Diagnosis not present

## 2023-11-08 DIAGNOSIS — Z794 Long term (current) use of insulin: Secondary | ICD-10-CM | POA: Diagnosis not present

## 2023-11-08 DIAGNOSIS — Z780 Asymptomatic menopausal state: Secondary | ICD-10-CM

## 2023-11-08 DIAGNOSIS — Z6838 Body mass index (BMI) 38.0-38.9, adult: Secondary | ICD-10-CM

## 2023-11-08 DIAGNOSIS — E1165 Type 2 diabetes mellitus with hyperglycemia: Secondary | ICD-10-CM

## 2023-11-08 DIAGNOSIS — E1169 Type 2 diabetes mellitus with other specified complication: Secondary | ICD-10-CM | POA: Diagnosis not present

## 2023-11-08 DIAGNOSIS — I129 Hypertensive chronic kidney disease with stage 1 through stage 4 chronic kidney disease, or unspecified chronic kidney disease: Secondary | ICD-10-CM | POA: Diagnosis not present

## 2023-11-08 LAB — BAYER DCA HB A1C WAIVED: HB A1C (BAYER DCA - WAIVED): 9 % — ABNORMAL HIGH (ref 4.8–5.6)

## 2023-11-08 MED ORDER — FREESTYLE LIBRE 3 READER DEVI: 1.0000 | Freq: Every day | 0 refills | Status: AC

## 2023-11-08 MED ORDER — AMLODIPINE BESYLATE 10 MG PO TABS: 10.0000 mg | ORAL_TABLET | Freq: Every day | ORAL | 0 refills | Status: DC

## 2023-11-08 MED ORDER — EMPAGLIFLOZIN 25 MG PO TABS: 25.0000 mg | ORAL_TABLET | Freq: Every day | ORAL | 2 refills | Status: DC

## 2023-11-08 NOTE — Patient Instructions (Addendum)
Preventative Services:  Health Risk Assessment and Personalized Prevention Plan: Done today Bone Mass Measurements: Ordered today Breast Cancer Screening: up to date CVD Screening: Up to date Cervical Cancer Screening: N/A Colon Cancer Screening: up to date Depression Screening: done today Diabetes Screening: done today Glaucoma Screening: see your eye doctor Hepatitis B vaccine: N/A Hepatitis C screening:  up to date HIV Screening: up to date Flu Vaccine: up to date Lung cancer Screening: N/A Obesity Screening: Done today Pneumonia Vaccines (2): up to date STI Screening: N/A  Please call to schedule your mammogram and/or bone density: [facility] at [facility]  Address: [address], [place], [place] [address] Phone: [phone]  [facility] [address] [place],  [place]  [address] Phone: [phone]

## 2023-11-08 NOTE — Assessment & Plan Note (Signed)
Encouraged diet and exercise with goal of losing 1-2lbs per week. Call with any concerns.  

## 2023-11-08 NOTE — Assessment & Plan Note (Signed)
Not doing great with A1c of 9.0. Will start jardiance and recheck in 3 months. Call with any concerns.

## 2023-11-08 NOTE — Assessment & Plan Note (Signed)
Running high. Will increase her amlodipine to 10mg  and recheck 3 months. Call with any concerns.

## 2023-12-08 ENCOUNTER — Other Ambulatory Visit: Payer: Self-pay | Admitting: Family Medicine

## 2023-12-12 NOTE — Telephone Encounter (Signed)
 Unable to refill per protocol, Rx expired. Discontinued 09/07/23, dose change.  Requested Prescriptions  Pending Prescriptions Disp Refills   amLODipine  (NORVASC ) 2.5 MG tablet [Pharmacy Med Name: AMLODIPINE  BESYLATE 2.5 MG TAB] 90 tablet 1    Sig: TAKE 1 TABLET BY MOUTH ONCE DAILY     Cardiovascular: Calcium Channel Blockers 2 Failed - 12/12/2023 12:28 PM      Failed - Last BP in normal range    BP Readings from Last 1 Encounters:  11/08/23 (!) 150/78         Passed - Last Heart Rate in normal range    Pulse Readings from Last 1 Encounters:  11/08/23 89         Passed - Valid encounter within last 6 months    Recent Outpatient Visits           1 month ago Encounter for [employer] annual wellness exam   [place] [facility] [place], Megan P, DO   3 months ago Type 2 diabetes mellitus with obesity (HCC)   [place] [facility] [place], Megan P, DO   4 months ago Benign hypertensive renal disease   [place] [facility] [place], Megan P, DO   7 months ago Type 2 diabetes mellitus with hyperglycemia, with long-term current use of insulin  [facility])   [place] [facility] [place], Megan P, DO   8 months ago Type 2 diabetes mellitus with hyperglycemia, with long-term current use of insulin  [facility])   [place] [facility] [place], Duwaine SQUIBB, DO       Future Appointments             In 1 month Johnson, Duwaine SQUIBB, DO  [facility]

## 2023-12-16 ENCOUNTER — Other Ambulatory Visit: Payer: Self-pay | Admitting: Family Medicine

## 2023-12-16 NOTE — Telephone Encounter (Signed)
Requested Prescriptions  Pending Prescriptions Disp Refills   omeprazole (PRILOSEC) 40 MG capsule [Pharmacy Med Name: OMEPRAZOLE DR 40 MG CAP] 90 capsule 2    Sig: TAKE 1 CAPSULE BY MOUTH ONCE DAILY     Gastroenterology: Proton Pump Inhibitors Passed - 12/16/2023  5:52 PM      Passed - Valid encounter within last 12 months    Recent Outpatient Visits           1 month ago Encounter for [employer] annual wellness exam   [place] [facility] [place], Megan P, DO   3 months ago Type 2 diabetes mellitus with obesity (HCC)   [place] [facility] [place], Megan P, DO   4 months ago Benign hypertensive renal disease   [place] [facility] [place], Megan P, DO   7 months ago Type 2 diabetes mellitus with hyperglycemia, with long-term current use of insulin (HCC)   [place] [facility] [place], Megan P, DO   8 months ago Type 2 diabetes mellitus with hyperglycemia, with long-term current use of insulin [facility])   [place] [facility] [place], Oralia Rud, DO       Future Appointments             In 1 month Johnson, Oralia Rud, DO [place] [facility]

## 2023-12-26 DIAGNOSIS — M1711 Unilateral primary osteoarthritis, right knee: Secondary | ICD-10-CM | POA: Diagnosis not present

## 2023-12-26 DIAGNOSIS — G8929 Other chronic pain: Secondary | ICD-10-CM | POA: Diagnosis not present

## 2023-12-26 DIAGNOSIS — Z794 Long term (current) use of insulin: Secondary | ICD-10-CM | POA: Diagnosis not present

## 2023-12-26 DIAGNOSIS — E1142 Type 2 diabetes mellitus with diabetic polyneuropathy: Secondary | ICD-10-CM | POA: Diagnosis not present

## 2024-01-12 ENCOUNTER — Other Ambulatory Visit: Payer: Self-pay | Admitting: Physical Medicine and Rehabilitation

## 2024-01-12 DIAGNOSIS — M4802 Spinal stenosis, cervical region: Secondary | ICD-10-CM | POA: Diagnosis not present

## 2024-01-12 DIAGNOSIS — M5412 Radiculopathy, cervical region: Secondary | ICD-10-CM | POA: Diagnosis not present

## 2024-01-12 DIAGNOSIS — M47812 Spondylosis without myelopathy or radiculopathy, cervical region: Secondary | ICD-10-CM | POA: Diagnosis not present

## 2024-01-12 DIAGNOSIS — M5416 Radiculopathy, lumbar region: Secondary | ICD-10-CM | POA: Diagnosis not present

## 2024-01-12 DIAGNOSIS — M5126 Other intervertebral disc displacement, lumbar region: Secondary | ICD-10-CM | POA: Diagnosis not present

## 2024-01-12 DIAGNOSIS — Z79899 Other long term (current) drug therapy: Secondary | ICD-10-CM | POA: Diagnosis not present

## 2024-01-16 ENCOUNTER — Telehealth: Payer: Self-pay

## 2024-01-16 ENCOUNTER — Other Ambulatory Visit: Payer: Self-pay | Admitting: Family Medicine

## 2024-01-16 ENCOUNTER — Other Ambulatory Visit: Payer: Self-pay

## 2024-01-16 DIAGNOSIS — E669 Obesity, unspecified: Secondary | ICD-10-CM

## 2024-01-16 NOTE — Telephone Encounter (Signed)
 Patient was identified as falling into the [place] Measure - Diabetes.   Patient was: Referred to pharmacy for chronic disease management.

## 2024-01-17 ENCOUNTER — Telehealth: Payer: Self-pay

## 2024-01-17 NOTE — Telephone Encounter (Signed)
Requested Prescriptions  Pending Prescriptions Disp Refills   lisinopril (ZESTRIL) 40 MG tablet [Pharmacy Med Name: LISINOPRIL 40 MG TAB] 90 tablet 1    Sig: TAKE 1 TABLET BY MOUTH ONCE DAILY     Cardiovascular:  ACE Inhibitors Failed - 01/17/2024  2:50 PM      Failed - Last BP in normal range    BP Readings from Last 1 Encounters:  11/08/23 (!) 150/78         Passed - Cr in normal range and within 180 days    Creatinine, Ser  Date Value Ref Range Status  09/07/2023 0.98 0.57 - 1.00 mg/dL Final         Passed - K in normal range and within 180 days    Potassium  Date Value Ref Range Status  09/07/2023 4.6 3.5 - 5.2 mmol/L Final         Passed - Patient is not pregnant      Passed - Valid encounter within last 6 months    Recent Outpatient Visits           2 months ago Encounter for [employer] annual wellness exam   [place] [facility] [place], Megan P, DO   4 months ago Type 2 diabetes mellitus with obesity (HCC)   [place] [facility] [place], Megan P, DO   5 months ago Benign hypertensive renal disease   [place] [facility] [place], Megan P, DO   9 months ago Type 2 diabetes mellitus with hyperglycemia, with long-term current use of insulin (HCC)   [place] [facility], Megan P, DO   9 months ago Type 2 diabetes mellitus with hyperglycemia, with long-term current use of insulin [facility])   [place] [facility] [place], Oralia Rud, DO       Future Appointments             In 2 weeks [place], Oralia Rud, DO [place] [facility]

## 2024-01-17 NOTE — Progress Notes (Signed)
Care Guide Pharmacy Note  01/17/2024 Name: Sierra Copeland MRN: [id number] DOB: [birth year]  Referred By: Dorcas Carrow, DO Reason for referral: Care Coordination (Outreach to schedule with pharm d )   Sierra Copeland is a 68 y.o. year old female who is a primary care patient of Dorcas Carrow, DO.  Sierra Copeland was referred to the pharmacist for assistance related to: DMII  Successful contact was made with the patient to discuss pharmacy services including being ready for the pharmacist to call at least 5 minutes before the scheduled appointment time and to have medication bottles and any blood pressure readings ready for review. The patient agreed to meet with the pharmacist via telephone visit on (date/time).01/26/2024  Penne Lash , RMA     [place]  [facility], [facility] Guide  Direct Dial: [phone]  Website: [email or web address]

## 2024-01-17 NOTE — Progress Notes (Signed)
Care Guide Pharmacy Note  01/17/2024 Name: Sierra Copeland MRN: [id number] DOB: Oct 22, 1956  Referred By: Dorcas Carrow, DO Reason for referral: Care Coordination (Outreach to schedule with pharm d )   Sierra Copeland is a 68 y.o. year old female who is a primary care patient of Dorcas Carrow, DO.  Sierra Copeland was referred to the pharmacist for assistance related to: DMII  An unsuccessful telephone outreach was attempted today to contact the patient who was referred to the pharmacy team for assistance with medication management. Additional attempts will be made to contact the patient.  Penne Lash , RMA     [facility]  [facility], [facility] Guide  Direct Dial: [phone]  Website: [email or web address]

## 2024-01-20 ENCOUNTER — Ambulatory Visit: Payer: Medicare Other | Admitting: Nurse Practitioner

## 2024-01-20 ENCOUNTER — Other Ambulatory Visit: Payer: Self-pay | Admitting: Family Medicine

## 2024-01-20 VITALS — BP 146/73 | Temp 98.6°F | Ht 61.0 in | Wt 206.0 lb

## 2024-01-20 DIAGNOSIS — H6992 Unspecified Eustachian tube disorder, left ear: Secondary | ICD-10-CM | POA: Diagnosis not present

## 2024-01-20 DIAGNOSIS — N3001 Acute cystitis with hematuria: Secondary | ICD-10-CM | POA: Diagnosis not present

## 2024-01-20 DIAGNOSIS — R300 Dysuria: Secondary | ICD-10-CM | POA: Diagnosis not present

## 2024-01-20 LAB — URINALYSIS, ROUTINE W REFLEX MICROSCOPIC
Bilirubin, UA: NEGATIVE
Nitrite, UA: NEGATIVE
Specific Gravity, UA: 1.02 (ref 1.005–1.030)
Urobilinogen, Ur: 0.2 mg/dL (ref 0.2–1.0)
pH, UA: 5.5 (ref 5.0–7.5)

## 2024-01-20 LAB — MICROSCOPIC EXAMINATION: WBC, UA: >30 /[HPF] — ABNORMAL HIGH (ref 0–5)

## 2024-01-20 MED ORDER — NITROFURANTOIN MONOHYD MACRO 100 MG PO CAPS: 100.0000 mg | ORAL_CAPSULE | Freq: Two times a day (BID) | ORAL | 0 refills | Status: DC

## 2024-01-20 NOTE — Progress Notes (Signed)
BP (!) 146/73 (BP Location: Left Arm, Patient Position: Sitting, Cuff Size: Large)   Temp 98.6 F (37 C)   Ht 5\' 1"  (1.549 m)   Wt 206 lb (93.4 kg)   LMP 03/17/2013 (Approximate)   SpO2 97%   BMI 38.92 kg/m    Subjective:    Patient ID: Sierra Copeland, female    DOB: 07-Feb-1956, 68 y.o.   MRN: [id number]  HPI: Sierra Copeland is a 68 y.o. female  Chief Complaint  Patient presents with   Urinary Tract Infection    Pt stated--cloudy urine, discomfort lower belly-2 weeks. Denied fever. Left ear-feeling clogged, drainage ear to the throat--1 week.    URINARY SYMPTOMS Symptoms started a few weeks ago. Dysuria: no Urinary frequency: no Urgency: yes Small volume voids: yes Symptom severity: no Urinary incontinence: no Foul odor: no Hematuria: no Abdominal pain: yes Back pain: no Suprapubic pain/pressure: no Flank pain: no Fever:  no Vomiting: no Relief with cranberry juice: no Relief with pyridium: no Status: stable Previous urinary tract infection: no Recurrent urinary tract infection: no   EAR PAIN Duration:  1 week ago Involved ear(s): left Severity:  3/10  Quality:  dull Fever: no Otorrhea: no Upper respiratory infection symptoms: yes Pruritus: no Hearing loss: no Water immersion no Using Q-tips: no Recurrent otitis media: no Status: stable Treatments attempted: none   Relevant past medical, surgical, family and social history reviewed and updated as indicated. Interim medical history since our last visit reviewed. Allergies and medications reviewed and updated.  Review of Systems  Constitutional:  Negative for fever.  HENT:  Positive for congestion and ear pain.   Gastrointestinal:  Positive for abdominal pain. Negative for vomiting.  Genitourinary:  Positive for decreased urine volume and urgency. Negative for dysuria, flank pain, frequency and hematuria.  Musculoskeletal:  Negative for back pain.    Per HPI unless specifically indicated  above     Objective:    BP (!) 146/73 (BP Location: Left Arm, Patient Position: Sitting, Cuff Size: Large)   Temp 98.6 F (37 C)   Ht 5\' 1"  (1.549 m)   Wt 206 lb (93.4 kg)   LMP 03/17/2013 (Approximate)   SpO2 97%   BMI 38.92 kg/m   Wt Readings from Last 3 Encounters:  01/20/24 206 lb (93.4 kg)  11/08/23 211 lb 6.4 oz (95.9 kg)  09/07/23 212 lb 3.2 oz (96.3 kg)    Physical Exam Vitals and nursing note reviewed.  Constitutional:      General: She is not in acute distress.    Appearance: Normal appearance. She is normal weight. She is not ill-appearing, toxic-appearing or diaphoretic.  HENT:     Head: Normocephalic.     Right Ear: Tympanic membrane and external ear normal.     Left Ear: External ear normal. A middle ear effusion is present.     Nose: Nose normal.     Mouth/Throat:     Mouth: Mucous membranes are moist.     Pharynx: Oropharynx is clear.  Eyes:     General:        Right eye: No discharge.        Left eye: No discharge.     Extraocular Movements: Extraocular movements intact.     Conjunctiva/sclera: Conjunctivae normal.     Pupils: Pupils are equal, round, and reactive to light.  Cardiovascular:     Rate and Rhythm: Normal rate and regular rhythm.     Heart sounds: No murmur  heard. Pulmonary:     Effort: Pulmonary effort is normal. No respiratory distress.     Breath sounds: Normal breath sounds. No wheezing or rales.  Abdominal:     General: Abdomen is flat. Bowel sounds are normal. There is no distension.     Palpations: Abdomen is soft.     Tenderness: There is no abdominal tenderness. There is no right CVA tenderness, left CVA tenderness or guarding.  Musculoskeletal:     Cervical back: Normal range of motion and neck supple.  Skin:    General: Skin is warm and dry.     Capillary Refill: Capillary refill takes less than 2 seconds.  Neurological:     General: No focal deficit present.     Mental Status: She is alert and oriented to person, place,  and time. Mental status is at baseline.  Psychiatric:        Mood and Affect: Mood normal.        Behavior: Behavior normal.        Thought Content: Thought content normal.        Judgment: Judgment normal.     Results for orders placed or performed in visit on 11/08/23  Bayer DCA Hb A1c Waived   Collection Time: 11/08/23  8:24 AM  Result Value Ref Range   HB A1C (BAYER DCA - WAIVED) 9.0 (H) 4.8 - 5.6 %      Assessment & Plan:   Problem List Items Addressed This Visit   None Visit Diagnoses       Acute cystitis with hematuria    -  Primary   Will treat with macrobid.  Increase water intake.  Urine sent for culture.   Relevant Orders   Urine Culture     Dysuria       Relevant Orders   Urinalysis, Routine w reflex microscopic     Dysfunction of left eustachian tube       Recommend using zyrtec and flonase to help with symptoms.        Follow up plan: No follow-ups on file.

## 2024-01-21 ENCOUNTER — Ambulatory Visit
Admission: RE | Admit: 2024-01-21 | Discharge: 2024-01-21 | Disposition: A | Discharge status: Home / Self Care | Payer: Medicare Other | Source: Ambulatory Visit | Attending: Physical Medicine and Rehabilitation | Admitting: Physical Medicine and Rehabilitation

## 2024-01-21 DIAGNOSIS — M5412 Radiculopathy, cervical region: Secondary | ICD-10-CM

## 2024-01-23 ENCOUNTER — Encounter: Payer: Self-pay | Admitting: Nurse Practitioner

## 2024-01-23 LAB — URINE CULTURE

## 2024-01-23 NOTE — Telephone Encounter (Signed)
 Requested Prescriptions  Pending Prescriptions Disp Refills   empagliflozin (JARDIANCE) 25 MG TABS tablet [Pharmacy Med Name: JARDIANCE 25 MG TAB] 90 tablet 0    Sig: TAKE 1 TABLET BY MOUTH ONCE DAILY BEFOREBREAKFAST     Endocrinology:  Diabetes - SGLT2 Inhibitors Failed - 01/23/2024  8:59 AM      Failed - HBA1C is between 0 and 7.9 and within 180 days    Hemoglobin A1C  Date Value Ref Range Status  01/30/2020 9.6  Final   HB A1C (BAYER DCA - WAIVED)  Date Value Ref Range Status  11/08/2023 9.0 (H) 4.8 - 5.6 % Final    Comment:             Prediabetes: 5.7 - 6.4          Diabetes: >6.4          Glycemic control for adults with diabetes: <7.0          Passed - Cr in normal range and within 360 days    Creatinine, Ser  Date Value Ref Range Status  09/07/2023 0.98 0.57 - 1.00 mg/dL Final         Passed - eGFR in normal range and within 360 days    GFR calc Af Amer  Date Value Ref Range Status  12/01/2020 70 >59 mL/min/1.73 Final    Comment:    **In accordance with recommendations from the NKF-ASN Task force,**   [facility] is in the process of updating its eGFR calculation to the   2021 CKD-EPI creatinine equation that estimates kidney function   without a race variable.    GFR, Estimated  Date Value Ref Range Status  02/19/2022 >60 >60 mL/min Final    Comment:    (NOTE) Calculated using the CKD-EPI Creatinine Equation (2021)    eGFR  Date Value Ref Range Status  09/07/2023 64 >59 mL/min/1.73 Final         Passed - Valid encounter within last 6 months    Recent Outpatient Visits           2 months ago Encounter for Medicare annual wellness exam   [place] [facility] [place], Megan P, DO   4 months ago Type 2 diabetes mellitus with obesity (HCC)   [place] [facility] [place], Megan P, DO   6 months ago Benign hypertensive renal disease   [place] [facility] [place], Megan P, DO   9 months ago Type 2 diabetes  mellitus with hyperglycemia, with long-term current use of insulin (HCC)   [place] [facility], Megan P, DO   10 months ago Type 2 diabetes mellitus with hyperglycemia, with long-term current use of insulin [facility])   [place] [facility] [place], Oralia Rud, DO       Future Appointments             In 2 weeks Laural Benes, Oralia Rud, DO [place] [facility]

## 2024-01-26 ENCOUNTER — Other Ambulatory Visit: Payer: Self-pay

## 2024-01-26 NOTE — Progress Notes (Unsigned)
 01/26/2024 Name: Sierra Copeland MRN: [id number] DOB: May 31, 1956  Chief Complaint  Patient presents with   Diabetes   Sierra Copeland is a 68 y.o. year old female who presented for a telephone visit.   They were referred to the pharmacist by their Case Management Team  for assistance in managing diabetes.    Subjective:  Care Team: Primary Care Provider: Dorcas Carrow, DO ; Next Scheduled Visit: 02/06/24  Medication Access/Adherence  Current Pharmacy:  [facility] [place], [place] - [address] [place] [address] Phone: [phone] Fax: [phone]  [facility] - [place], [place] - [address] [facility] [address] [place] [address] [place] [address] Phone: [phone] Fax: [phone]  -Patient reports affordability concerns with their medications: No  -Patient reports access/transportation concerns to their pharmacy: No  -Patient reports adherence concerns with their medications:  No    Diabetes: Current medications: Jardiance 25mg  daily, Toujeo 60 units at bedtime, Humalog 20 units TID, metformin 1000mg  BID,  -Medications tried in the past: Could not tolerate Ozempic or Mounjaro due to GI side effects -Using [place] for CGM -Endorses FBG 112-130 -Patient reports hypoglycemic s/sx including dizziness, shakiness, sweating.  This occurs rarely, and Libre alarms- resolved with orange juice -Patient denies hyperglycemic symptoms including polyuria, polydipsia, polyphagia, nocturia, neuropathy, blurred vision.  Hypertension: Current medications: amlodipine 5mg  daily, lisinopril 40mg  daily -Patient taking 2 tablets of amlodipine 2.5mg  versus 10mg  daily listed on medication list- because of orthostatic hypotension -Patient not regularly checking home BP-she is in need of new monitor -Patient reports hypotensive s/sx including dizziness, lightheadedness.  This occurs specifically when changing posture. -Patient denies  hypertensive symptoms including headache, chest pain, shortness of breath -Endorses pain, stress, and dietary intake that are likely affected BP readings  Objective: Lab Results  Component Value Date   HGBA1C 9.0 (H) 11/08/2023   Lab Results  Component Value Date   CREATININE 0.98 09/07/2023   BUN 17 09/07/2023   NA 141 09/07/2023   K 4.6 09/07/2023   CL 100 09/07/2023   CO2 21 09/07/2023   Medications Reviewed Today     Reviewed by Lenna Gilford, RPH (Pharmacist) on 01/26/24 at 1554  Med List Status: <None>   Medication Order Taking? Sig Documenting Provider Last Dose Status Informant  acetaminophen (TYLENOL) 650 MG CR tablet [id number] Yes Take 650 mg by mouth every 8 (eight) hours as needed for pain. [provider] Taking Active   amLODipine (NORVASC) 10 MG tablet [id number] Yes Take 1 tablet (10 mg total) by mouth daily.  Patient taking differently: Take 5 mg by mouth daily. Taking 2 tablets of 2.5mg    Dorcas Carrow, DO Taking Active   aspirin-acetaminophen-caffeine (EXCEDRIN MIGRAINE) [id number] MG tablet [id number] Yes Take 1 tablet by mouth every 6 (six) hours as needed for headache. [provider] Taking Active   busPIRone (BUSPAR) 5 MG tablet [id number] Yes Take 1 tablet (5 mg total) by mouth 2 (two) times daily. Olevia Perches P, DO Taking Active   Cholecalciferol (VITAMIN D3) 2000 units capsule [id number] Yes Take 2,000 Units by mouth at bedtime.  [provider] Taking Active Self  Coenzyme Q10 (COQ-10) 100 MG CAPS [id number] No Take 100 mg by mouth daily.  Patient not taking: Reported on 01/26/2024   [provider] Not Taking Active Self  Continuous Glucose Receiver (FREESTYLE LIBRE 3 READER) [place] [id number] Yes 1 kit by Does not apply route daily.  Johnson, Megan P, DO Taking Active   Continuous Glucose Sensor (FREESTYLE LIBRE 3 SENSOR) [place] [id number] Yes USE 1 SENSOR ON THE SKIN EVERY 14 DAYS. USE TO CHECK BLOOD GLUCOSE CONTINUOUSLY  Johnson, Megan P, DO Taking Active   empagliflozin (JARDIANCE) 25 MG TABS tablet [id number] Yes TAKE 1 TABLET BY MOUTH ONCE DAILY BEFOREBREAKFAST Johnson, Megan P, DO Taking Active   escitalopram (LEXAPRO) 20 MG tablet [id number] Yes Take 1 tablet (20 mg total) by mouth daily. Laural Benes, Megan P, DO Taking Active   fexofenadine (ALLEGRA) 180 MG tablet [id number] Yes Take 180 mg by mouth daily with breakfast.  [provider] Taking Active Self  fluticasone (FLONASE) 50 MCG/ACT nasal spray [id number] Yes Place 1-2 sprays into both nostrils every evening.  [provider] Taking Active Self  gabapentin (NEURONTIN) 100 MG capsule [id number] Yes Take 2 capsules (200 mg total) by mouth 2 (two) times daily. Johnson, Megan P, DO Taking Active   Glucagon (GVOKE HYPOPEN 2-PACK) 1 MG/0.2ML SOAJ [id number] No Inject 1 each into the skin as needed.  Patient not taking: Reported on 01/26/2024   Dorcas Carrow, DO Not Taking Active   Hyoscyamine Sulfate SL 0.125 MG SUBL [id number] Yes Place 0.125 mg under the tongue daily as needed (cramping). Johnson, Megan P, DO Taking Active   insulin glargine, 1 Unit Dial, (TOUJEO SOLOSTAR) 300 UNIT/ML Solostar Pen [id number] Yes 50-60 units at bedtime  Patient taking differently: 60 Units at bedtime.   Johnson, Megan P, DO Taking Active   insulin lispro (HUMALOG KWIKPEN) 100 UNIT/ML KwikPen [id number] Yes INJECT 10-20 UNITS SUBCUTANEOUSLY WITH BREAKFAST  WITH LUNCH AND DINNER  Patient taking differently: Inject 20 Units into the skin 3 (three) times daily. With meals   Olevia Perches P, DO Taking Active   Insulin Syringe-Needle U-100 30G X 5/16" 0.3 ML MISC [id number] Yes Inject 1 each into the skin 3 (three) times daily. Johnson, Megan P, DO Taking Active   leflunomide (ARAVA) 10 MG tablet [id number] Yes Take 1 tablet by mouth daily. [provider] Taking Active   lisinopril (ZESTRIL) 40 MG tablet [id number] Yes TAKE 1 TABLET BY MOUTH ONCE DAILY Dorcas Carrow, DO Taking Active   Magnesium 250 MG TABS [id number] No Take 250 mg by mouth daily.  Patient not taking: Reported on 01/26/2024   [provider] Not Taking Active Self  metFORMIN (GLUCOPHAGE) 1000 MG tablet [id number] Yes Take 1 tablet (1,000 mg total) by mouth 2 (two) times daily with a meal. Laural Benes, Megan P, DO Taking Active   omeprazole (PRILOSEC) 40 MG capsule [id number] Yes TAKE 1 CAPSULE BY MOUTH ONCE DAILY Johnson, Megan P, DO Taking Active   ondansetron (ZOFRAN) 4 MG tablet [id number] Yes Take 1 tablet (4 mg total) by mouth every 8 (eight) hours as needed for nausea or vomiting. Johnson, Megan P, DO Taking Active   simvastatin (ZOCOR) 40 MG tablet [id number] Yes Take 1 tablet (40 mg total) by mouth at bedtime. Johnson, Megan P, DO Taking Active   tiZANidine (ZANAFLEX) 2 MG tablet [id number] Yes Take 2 mg by mouth at bedtime.  [provider] Taking Active Self           Med Note Tamera Stands Aug 29, 2017  8:37 AM)    traMADol (ULTRAM) 50 MG tablet [id number] Yes Take 100 mg by mouth 2 (two) times daily. [provider] Taking Active Self           Med Note (Christain Sacramento,  Meryl Crutch Aug 29, 2017  8:37 AM)    triamcinolone acetonide [facility]) injection 40 mg [id number]   Olevia Perches P, DO  Active   ULTRACARE PEN NEEDLES 33G X 4 MM MISC [id number] Yes Inject 1 each into the skin 2 (two) times daily. Olevia Perches P, DO Taking Active   zinc gluconate 50 MG tablet [id number] Yes Take 50 mg by mouth daily. [provider] Taking Active Self  Med List Note Raynelle Chary, CPhT 02/15/22 1646): Marland Kitchen           Assessment/Plan:   Diabetes: -Currently uncontrolled based on A1c >7% -Reviewed goal A1c, goal fasting, and goal 2 hour post prandial glucose -Patient sees PCP again 3/10 and due for A1c.  If this remains >7%, could consider DPP4 medication since patient not on GLP1  Hypertension: -Currently uncontrolled -Discussed dietary  modifications, such as decreasing NaCl intake -Contacting patient's insurance to see if they will cover new BP monitor; if not, and purchasing one OTC not affordable, I will provide one through [facility] standing order -Continue current regimen and monitor home BP regularly -If patient to remain on amlodipine 5mg  daily, I recommend sending updated prescription to pharmacy   Follow Up Plan: 1 month  Lenna Gilford, PharmD, DPLA

## 2024-02-01 ENCOUNTER — Telehealth: Payer: Self-pay

## 2024-02-01 NOTE — Progress Notes (Signed)
   02/01/2024  Patient ID: Sierra Copeland, female   DOB: [birth year], 68 y.o.   MRN: [id number]  Contacted patient's insurance, and she receives a quarterly $50 credit for certain over-the-counter items, including BP monitors.  This credit can be used at [employer], [facility], or [facility] and will cover [employer] automated, upper arm blood pressure monitors for home use.  Sending patient a MyChart message to make her aware, so she can used this quarter's benefit before it expires on 02/27/2024.  Lenna Gilford, PharmD, DPLA

## 2024-02-02 ENCOUNTER — Inpatient Hospital Stay: Payer: Disability Insurance | Admitting: Oncology

## 2024-02-06 ENCOUNTER — Encounter: Payer: Self-pay | Admitting: Family Medicine

## 2024-02-06 ENCOUNTER — Ambulatory Visit (INDEPENDENT_AMBULATORY_CARE_PROVIDER_SITE_OTHER): Payer: Medicare Other | Admitting: Family Medicine

## 2024-02-06 ENCOUNTER — Other Ambulatory Visit: Payer: Self-pay | Admitting: Family Medicine

## 2024-02-06 VITALS — BP 138/62 | HR 83 | Ht 61.0 in | Wt 206.2 lb

## 2024-02-06 DIAGNOSIS — M059 Rheumatoid arthritis with rheumatoid factor, unspecified: Secondary | ICD-10-CM | POA: Diagnosis not present

## 2024-02-06 DIAGNOSIS — Z794 Long term (current) use of insulin: Secondary | ICD-10-CM

## 2024-02-06 DIAGNOSIS — Z1211 Encounter for screening for malignant neoplasm of colon: Secondary | ICD-10-CM | POA: Diagnosis not present

## 2024-02-06 DIAGNOSIS — Z0000 Encounter for general adult medical examination without abnormal findings: Secondary | ICD-10-CM

## 2024-02-06 DIAGNOSIS — I129 Hypertensive chronic kidney disease with stage 1 through stage 4 chronic kidney disease, or unspecified chronic kidney disease: Secondary | ICD-10-CM

## 2024-02-06 DIAGNOSIS — C50912 Malignant neoplasm of unspecified site of left female breast: Secondary | ICD-10-CM

## 2024-02-06 DIAGNOSIS — E7800 Pure hypercholesterolemia, unspecified: Secondary | ICD-10-CM

## 2024-02-06 DIAGNOSIS — Z87440 Personal history of urinary (tract) infections: Secondary | ICD-10-CM

## 2024-02-06 DIAGNOSIS — E1165 Type 2 diabetes mellitus with hyperglycemia: Secondary | ICD-10-CM

## 2024-02-06 DIAGNOSIS — F3342 Major depressive disorder, recurrent, in full remission: Secondary | ICD-10-CM

## 2024-02-06 MED ORDER — METFORMIN HCL 1000 MG PO TABS: 1000.0000 mg | ORAL_TABLET | Freq: Two times a day (BID) | ORAL | 1 refills | Status: DC

## 2024-02-06 MED ORDER — AMLODIPINE BESYLATE 5 MG PO TABS: 5.0000 mg | ORAL_TABLET | Freq: Every day | ORAL | 1 refills | Status: DC

## 2024-02-06 MED ORDER — SIMVASTATIN 40 MG PO TABS: 40.0000 mg | ORAL_TABLET | Freq: Every day | ORAL | 1 refills | Status: DC

## 2024-02-06 MED ORDER — EMPAGLIFLOZIN 25 MG PO TABS: 25.0000 mg | ORAL_TABLET | Freq: Every day | ORAL | 1 refills | Status: DC

## 2024-02-06 MED ORDER — BUSPIRONE HCL 5 MG PO TABS: 5.0000 mg | ORAL_TABLET | Freq: Two times a day (BID) | ORAL | 5 refills | Status: DC

## 2024-02-06 MED ORDER — ESCITALOPRAM OXALATE 20 MG PO TABS: 20.0000 mg | ORAL_TABLET | Freq: Every day | ORAL | 1 refills | Status: DC

## 2024-02-06 MED ORDER — TOUJEO SOLOSTAR 300 UNIT/ML ~~LOC~~ SOPN: 60.0000 [IU] | PEN_INJECTOR | Freq: Every day | SUBCUTANEOUS | 5 refills | Status: DC

## 2024-02-06 MED ORDER — GABAPENTIN 100 MG PO CAPS: 200.0000 mg | ORAL_CAPSULE | Freq: Two times a day (BID) | ORAL | 1 refills | Status: DC

## 2024-02-06 NOTE — Assessment & Plan Note (Signed)
 Continue to follow with oncology. Call with any concerns.

## 2024-02-06 NOTE — Assessment & Plan Note (Signed)
 Rechecking labs today. Await results. Treat as needed.

## 2024-02-06 NOTE — Patient Instructions (Signed)
Please call to schedule your bone density: [facility]  Address: [address], [place], [place] [address] Phone: [phone]  [facility] [place] [address] [place],  [place]  [address] Phone: [phone]   

## 2024-02-06 NOTE — Assessment & Plan Note (Signed)
 Encouraged diet and exercise. Continue diet and exercise. Call with any concerns.

## 2024-02-06 NOTE — Progress Notes (Signed)
 BP 138/62   Pulse 83   Ht 5\' 1"  (1.549 m)   Wt 206 lb 3.2 oz (93.5 kg)   LMP 03/17/2013 (Approximate)   SpO2 98%   BMI 38.96 kg/m    Subjective:    Patient ID: Sierra Copeland, female    DOB: 05-24-1956, 68 y.o.   MRN: [id number]  HPI: Sierra Copeland is a 68 y.o. female presenting on 02/06/2024 for comprehensive medical examination. Current medical complaints include:  DIABETES Hypoglycemic episodes:no Polydipsia/polyuria: no Visual disturbance: no Chest pain: no Paresthesias: no Glucose Monitoring: yes  Accucheck frequency: continuous Taking Insulin?: yes Blood Pressure Monitoring: not checking Retinal Examination: Up to Date Foot Exam: Up to Date Diabetic Education: Completed Pneumovax: Up to Date Influenza: Up to Date Aspirin: no  HYPERTENSION / HYPERLIPIDEMIA Satisfied with current treatment? yes Duration of hypertension: chronic BP monitoring frequency: not checking BP medication side effects: no Past BP meds: lisinopril, amlodipine Duration of hyperlipidemia: chronic Cholesterol medication side effects: no Cholesterol supplements: none Past cholesterol medications: simvastatin Medication compliance: excellent compliance Aspirin: yes Recent stressors: no Recurrent headaches: no Visual changes: no Palpitations: no Dyspnea: no Chest pain: no Lower extremity edema: no Dizzy/lightheaded: no  DEPRESSION Mood status: controlled Satisfied with current treatment?: no Symptom severity: mild  Duration of current treatment : chronic Side effects: no Medication compliance: excellent compliance Psychotherapy/counseling: no  Previous psychiatric medications: lexapro Depressed mood: no Anxious mood: no Anhedonia: no Significant weight loss or gain: no Insomnia: no  Fatigue: yes Feelings of worthlessness or guilt: no Impaired concentration/indecisiveness: no Suicidal ideations: no Hopelessness: no Crying spells: no    01/20/2024   10:25 AM 11/08/2023     8:24 AM 09/07/2023    9:05 AM 07/25/2023    1:16 PM 04/22/2023    1:27 PM  Depression screen PHQ 2/9  Decreased Interest 0 0 0 0 0  Down, Depressed, Hopeless 0 0 0 1 0  PHQ - 2 Score 0 0 0 1 0  Altered sleeping 0 0 0 0 0  Tired, decreased energy 0 0 0 1 0  Change in appetite 0 0 0 0 0  Feeling bad or failure about yourself  0 0 0 0 0  Trouble concentrating 0 0 0 0 0  Moving slowly or fidgety/restless 0 0 0 0 0  Suicidal thoughts 0 0 0 0 0  PHQ-9 Score 0 0 0 2 0  Difficult doing work/chores Not difficult at all Not difficult at all Not difficult at all Not difficult at all     She currently lives with: alone Menopausal Symptoms: yes  Depression Screen done today and results listed below:     01/20/2024   10:25 AM 11/08/2023    8:24 AM 09/07/2023    9:05 AM 07/25/2023    1:16 PM 04/22/2023    1:27 PM  Depression screen PHQ 2/9  Decreased Interest 0 0 0 0 0  Down, Depressed, Hopeless 0 0 0 1 0  PHQ - 2 Score 0 0 0 1 0  Altered sleeping 0 0 0 0 0  Tired, decreased energy 0 0 0 1 0  Change in appetite 0 0 0 0 0  Feeling bad or failure about yourself  0 0 0 0 0  Trouble concentrating 0 0 0 0 0  Moving slowly or fidgety/restless 0 0 0 0 0  Suicidal thoughts 0 0 0 0 0  PHQ-9 Score 0 0 0 2 0  Difficult doing work/chores Not difficult at  all Not difficult at all Not difficult at all Not difficult at all     Past Medical History:  Past Medical History:  Diagnosis Date   Abnormal mammogram    Anxiety    Arthritis    Back pain    spinal stenosis and neck bone spur   Cancer (HCC)    Breast   Car sickness    Collagenous colitis    Depression    Diabetes mellitus without complication (HCC)    GERD (gastroesophageal reflux disease)    occ-no meds   Hair loss    TAKING THYROTAIN   Headache    migraines   Hyperlipemia    Hypertension    Osteoarthritis    seen by Rheum, not enoug evidence to support RA   Pinched nerve    PONV (postoperative nausea and vomiting)    after  tonsillectomy   Rosacea    Seborrheic dermatitis    Sleep apnea    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 Tumor is triple negative. She is dated radiation in August 2018. Continues to have problems with lymphedema   Wears glasses     Surgical History:  Past Surgical History:  Procedure Laterality Date   ACHILLES TENDON REPAIR  2007   right   BREAST LUMPECTOMY  2018   BREAST LUMPECTOMY Left 03/2017   BREAST SURGERY  2011   lt br bx-non cancer   CARPAL TUNNEL RELEASE  1989   right and left   CHOLECYSTECTOMY N/A 09/06/2017   Gallbladder not removed due to complications. Procedure: LAPAROSCOPY;  Surgeon: Nadeen Landau, MD;  Location: [facility] ORS;  Service: General;  Laterality: N/A;   COLONOSCOPY     COLONOSCOPY WITH PROPOFOL N/A 09/24/2021   Procedure: COLONOSCOPY WITH PROPOFOL;  Surgeon: Jaynie Collins, DO;  Location: [facility];  Service: Gastroenterology;  Laterality: N/A;  IDDM   epidural steroid injection x2  2017   ESOPHAGOGASTRODUODENOSCOPY N/A 01/21/2016   Procedure: ESOPHAGOGASTRODUODENOSCOPY (EGD);  Surgeon: Wallace Cullens, MD;  Location: [facility];  Service: Gastroenterology;  Laterality: N/A;  Please call at work [phone] Diabetic - insulin   ESOPHAGOGASTRODUODENOSCOPY N/A 09/24/2021   Procedure: ESOPHAGOGASTRODUODENOSCOPY (EGD);  Surgeon: Jaynie Collins, DO;  Location: [facility] ENDOSCOPY;  Service: Gastroenterology;  Laterality: N/A;   FINGER EXPLORATION  2013   left long    Lynch's Syndrome     MASTECTOMY Left 2018   Partial   NERVE EXPLORATION Left 10/03/2013   Procedure: NERVE EXPLORATION/ LEFT LONG RADIAL DISTAL NERVE neurolysis;  Surgeon: Marlowe Shores, MD;  Location: [facility];  Service: Orthopedics;  Laterality: Left;   SHOULDER ARTHROSCOPY WITH DEBRIDEMENT AND BICEP TENDON REPAIR Right 05/13/2016   Procedure: Arthroscopic debridement, open decompression, rotator cuff  repair, tenodesis,right shoulder.;  Surgeon: Christena Flake, MD;  Location: [facility] ORS;  Service: Orthopedics;  Laterality: Right;   TONSILLECTOMY  2000   UVULOPALATOPHARYNGOPLASTY  1998   and tonsils-tongue pexi    BREAST BIOPSY Left 03/14/2017       Medications:  Current Outpatient Medications on File Prior to Visit  Medication Sig   acetaminophen (TYLENOL) 650 MG CR tablet Take 650 mg by mouth every 8 (eight) hours as needed for pain.   aspirin-acetaminophen-caffeine (EXCEDRIN MIGRAINE) 250-250-65 MG tablet Take 1 tablet by mouth every 6 (six) hours as needed for headache.   Cholecalciferol (VITAMIN D3) 2000 units capsule Take 2,000 Units by mouth at bedtime.  Coenzyme Q10 (COQ-10) 100 MG CAPS Take 100 mg by mouth daily.   Continuous Glucose Receiver (FREESTYLE LIBRE 3 READER) DEVI 1 kit by Does not apply route daily.   Continuous Glucose Sensor (FREESTYLE LIBRE 3 SENSOR) MISC USE 1 SENSOR ON THE SKIN EVERY 14 DAYS. USE TO CHECK BLOOD GLUCOSE CONTINUOUSLY   fexofenadine (ALLEGRA) 180 MG tablet Take 180 mg by mouth daily with breakfast.    fluticasone (FLONASE) 50 MCG/ACT nasal spray Place 1-2 sprays into both nostrils every evening.    Glucagon (GVOKE HYPOPEN 2-PACK) 1 MG/0.2ML SOAJ Inject 1 each into the skin as needed.   Hyoscyamine Sulfate SL 0.125 MG SUBL Place 0.125 mg under the tongue daily as needed (cramping).   insulin lispro (HUMALOG KWIKPEN) 100 UNIT/ML KwikPen INJECT 10-20 UNITS SUBCUTANEOUSLY WITH BREAKFAST  WITH LUNCH AND DINNER (Patient taking differently: Inject 20 Units into the skin 3 (three) times daily. With meals)   Insulin Syringe-Needle U-100 30G X 5/16" 0.3 ML MISC Inject 1 each into the skin 3 (three) times daily.   leflunomide (ARAVA) 10 MG tablet Take 1 tablet by mouth daily.   lisinopril (ZESTRIL) 40 MG tablet TAKE 1 TABLET BY MOUTH ONCE DAILY   Magnesium 250 MG TABS Take 250 mg by mouth daily.   omeprazole (PRILOSEC) 40 MG capsule TAKE 1 CAPSULE BY MOUTH ONCE  DAILY   ondansetron (ZOFRAN) 4 MG tablet Take 1 tablet (4 mg total) by mouth every 8 (eight) hours as needed for nausea or vomiting.   tiZANidine (ZANAFLEX) 2 MG tablet Take 2 mg by mouth at bedtime.    traMADol (ULTRAM) 50 MG tablet Take 100 mg by mouth 2 (two) times daily.   ULTRACARE PEN NEEDLES 33G X 4 MM MISC Inject 1 each into the skin 2 (two) times daily.   zinc gluconate 50 MG tablet Take 50 mg by mouth daily.   Current Facility-Administered Medications on File Prior to Visit  Medication   triamcinolone acetonide (KENALOG-40) injection 40 mg    Allergies:  Allergies  Allergen Reactions   Mounjaro [Tirzepatide]     GI upset   Ozempic (0.25 Or 0.5 Mg-Dose) [Semaglutide(0.25 Or 0.5mg -Dos)] Other (See Comments)    GI upset   Prednisone Palpitations    Increases BG and HR.    Social History:  Social History   Socioeconomic History   Marital status: Widowed    Spouse name: Not on file   Number of children: Not on file   Years of education: Not on file   Highest education level: Some college, no degree  Occupational History   Not on file  Tobacco Use   Smoking status: Former    Current packs/day: 0.00    Average packs/day: 1 pack/day for 10.0 years (10.0 ttl pk-yrs)    Types: Cigarettes    Start date: 09/28/1976    Quit date: 09/28/1986    Years since quitting: 37.3   Smokeless tobacco: Never  Vaping Use   Vaping status: Never Used  Substance and Sexual Activity   Alcohol use: No   Drug use: No   Sexual activity: Yes  Other Topics Concern   Not on file  Social History Narrative   Not on file   Social Drivers of Health   Financial Resource Strain: Low Risk  (01/20/2024)   Overall Financial Resource Strain (CARDIA)    Difficulty of Paying Living Expenses: Not hard at all  Food Insecurity: No Food Insecurity (01/20/2024)   Hunger Vital Sign  Worried About Programme researcher, broadcasting/film/video in the Last Year: Never true    Ran Out of Food in the Last Year: Never true   Transportation Needs: No Transportation Needs (01/20/2024)   PRAPARE - Administrator, Civil Service (Medical): No    Lack of Transportation (Non-Medical): No  Physical Activity: Inactive (01/20/2024)   Exercise Vital Sign    Days of Exercise per Week: 0 days    Minutes of Exercise per Session: 30 min  Stress: No Stress Concern Present (01/20/2024)   [employer] of Occupational Health - Occupational Stress Questionnaire    Feeling of Stress : Only a little  Social Connections: Moderately Integrated (01/20/2024)   Social Connection and Isolation Panel [NHANES]    Frequency of Communication with Friends and Family: Twice a week    Frequency of Social Gatherings with Friends and Family: Twice a week    Attends Religious Services: More than 4 times per year    Active Member of [employer] or Organizations: No    Attends Engineer, structural: More than 4 times per year    Marital Status: Widowed  Recent Concern: Social Connections - Moderately Isolated (11/02/2023)   Social Connection and Isolation Panel [NHANES]    Frequency of Communication with Friends and Family: Once a week    Frequency of Social Gatherings with Friends and Family: Once a week    Attends Religious Services: More than 4 times per year    Active Member of [employer] or Organizations: Yes    Attends Banker Meetings: More than 4 times per year    Marital Status: Widowed  Intimate Partner Violence: Not At Risk (09/01/2022)   Humiliation, Afraid, Rape, and Kick questionnaire    Fear of Current or Ex-Partner: No    Emotionally Abused: No    Physically Abused: No    Sexually Abused: No   Social History   Tobacco Use  Smoking Status Former   Current packs/day: 0.00   Average packs/day: 1 pack/day for 10.0 years (10.0 ttl pk-yrs)   Types: Cigarettes   Start date: 09/28/1976   Quit date: 09/28/1986   Years since quitting: 37.3  Smokeless Tobacco Never   Social History   Substance and  Sexual Activity  Alcohol Use No    Family History:  Family History  Problem Relation Age of Onset   Liver cancer Mother 49       gallbaldder   Hypertension Mother    Hyperlipidemia Mother    Hyperlipidemia Father    Hypertension Father    Diabetes Maternal Aunt    Diabetes Maternal Uncle    Heart disease Maternal Uncle    Breast cancer Paternal Aunt    Diabetes Paternal Aunt    Breast cancer Paternal Aunt    Diabetes Paternal Uncle    Dementia Maternal Grandmother    Stroke Maternal Grandfather    Diabetes Paternal Grandmother    Heart disease Paternal Grandfather    Other Cousin 63       BRCA or similar mutation positive    Past medical history, surgical history, medications, allergies, family history and social history reviewed with patient today and changes made to appropriate areas of the chart.   Review of Systems  Constitutional:  Positive for diaphoresis. Negative for chills, fever, malaise/fatigue and weight loss.  HENT:  Positive for ear pain and sore throat. Negative for congestion, ear discharge, hearing loss, nosebleeds, sinus pain and tinnitus.   Eyes: Negative.  Respiratory: Negative.  Negative for stridor.   Cardiovascular: Negative.   Gastrointestinal:  Positive for diarrhea. Negative for abdominal pain, blood in stool, constipation, heartburn, melena, nausea and vomiting.  Genitourinary: Negative.   Musculoskeletal: Negative.   Skin: Negative.   Neurological:  Positive for dizziness. Negative for tingling, tremors, sensory change, speech change, focal weakness, seizures, loss of consciousness, weakness and headaches.  Endo/Heme/Allergies:  Positive for environmental allergies. Negative for polydipsia. Does not bruise/bleed easily.  Psychiatric/Behavioral: Negative.     All other ROS negative except what is listed above and in the HPI.      Objective:    BP 138/62   Pulse 83   Ht 5\' 1"  (1.549 m)   Wt 206 lb 3.2 oz (93.5 kg)   LMP 03/17/2013  (Approximate)   SpO2 98%   BMI 38.96 kg/m   Wt Readings from Last 3 Encounters:  02/06/24 206 lb 3.2 oz (93.5 kg)  01/20/24 206 lb (93.4 kg)  11/08/23 211 lb 6.4 oz (95.9 kg)    Physical Exam Vitals and nursing note reviewed.  Constitutional:      General: She is not in acute distress.    Appearance: Normal appearance. She is obese. She is not ill-appearing, toxic-appearing or diaphoretic.  HENT:     Head: Normocephalic and atraumatic.     Right Ear: Tympanic membrane, ear canal and external ear normal. There is no impacted cerumen.     Left Ear: Tympanic membrane, ear canal and external ear normal. There is no impacted cerumen.     Nose: Nose normal. No congestion or rhinorrhea.     Mouth/Throat:     Mouth: Mucous membranes are moist.     Pharynx: Oropharynx is clear. No oropharyngeal exudate or posterior oropharyngeal erythema.  Eyes:     General: No scleral icterus.       Right eye: No discharge.        Left eye: No discharge.     Extraocular Movements: Extraocular movements intact.     Conjunctiva/sclera: Conjunctivae normal.     Pupils: Pupils are equal, round, and reactive to light.  Neck:     Vascular: No carotid bruit.  Cardiovascular:     Rate and Rhythm: Normal rate and regular rhythm.     Pulses: Normal pulses.     Heart sounds: No murmur heard.    No friction rub. No gallop.  Pulmonary:     Effort: Pulmonary effort is normal. No respiratory distress.     Breath sounds: Normal breath sounds. No stridor. No wheezing, rhonchi or rales.  Chest:     Chest wall: No tenderness.  Abdominal:     General: Abdomen is flat. Bowel sounds are normal. There is no distension.     Palpations: Abdomen is soft. There is no mass.     Tenderness: There is no abdominal tenderness. There is no right CVA tenderness, left CVA tenderness, guarding or rebound.     Hernia: No hernia is present.  Genitourinary:    Comments: Breast and pelvic exams deferred with shared decision  making Musculoskeletal:        General: No swelling, tenderness, deformity or signs of injury.     Cervical back: Normal range of motion and neck supple. No rigidity. No muscular tenderness.     Right lower leg: No edema.     Left lower leg: No edema.  Lymphadenopathy:     Cervical: No cervical adenopathy.  Skin:    General: Skin is warm  and dry.     Capillary Refill: Capillary refill takes less than 2 seconds.     Coloration: Skin is not jaundiced or pale.     Findings: No bruising, erythema, lesion or rash.  Neurological:     General: No focal deficit present.     Mental Status: She is alert and oriented to person, place, and time. Mental status is at baseline.     Cranial Nerves: No cranial nerve deficit.     Sensory: No sensory deficit.     Motor: No weakness.     Coordination: Coordination normal.     Gait: Gait normal.     Deep Tendon Reflexes: Reflexes normal.  Psychiatric:        Mood and Affect: Mood normal.        Behavior: Behavior normal.        Thought Content: Thought content normal.        Judgment: Judgment normal.     Results for orders placed or performed in visit on 01/20/24  Microscopic Examination   Collection Time: 01/20/24 10:32 AM   Urine  Result Value Ref Range   WBC, UA >30 (H) 0 - 5 /hpf   RBC, Urine 0-2 0 - 2 /hpf   Epithelial Cells (non renal) 0-10 0 - 10 /hpf   Bacteria, UA Many (A) None seen/Few  Urinalysis, Routine w reflex microscopic   Collection Time: 01/20/24 10:32 AM  Result Value Ref Range   Specific Gravity, UA 1.020 1.005 - 1.030   pH, UA 5.5 5.0 - 7.5   Color, UA Yellow Yellow   Appearance Ur Cloudy (A) Clear   Leukocytes,UA 1+ (A) Negative   Protein,UA 1+ (A) Negative/Trace   Glucose, UA 3+ (A) Negative   Ketones, UA Trace (A) Negative   RBC, UA Trace (A) Negative   Bilirubin, UA Negative Negative   Urobilinogen, Ur 0.2 0.2 - 1.0 mg/dL   Nitrite, UA Negative Negative   Microscopic Examination See below:   Urine Culture    Collection Time: 01/20/24 10:53 AM   Specimen: Urine   UR  Result Value Ref Range   Urine Culture, Routine Final report (A)    Organism ID, Bacteria Enterococcus faecalis (A)    ORGANISM ID, BACTERIA Comment    Antimicrobial Susceptibility Comment       Assessment & Plan:   Problem List Items Addressed This Visit       Endocrine   Type 2 diabetes mellitus with hyperglycemia (HCC)   Rechecking labs today. Await results. Treat as needed.       Relevant Medications   empagliflozin (JARDIANCE) 25 MG TABS tablet   insulin glargine, 1 Unit Dial, (TOUJEO SOLOSTAR) 300 UNIT/ML Solostar Pen   metFORMIN (GLUCOPHAGE) 1000 MG tablet   simvastatin (ZOCOR) 40 MG tablet   Other Relevant Orders   CBC with Differential/Platelet   Comprehensive metabolic panel   Lipid Panel w/o Chol/HDL Ratio   TSH   Hgb A1c w/o eAG   Urine Microalbumin w/creat. ratio     Musculoskeletal and Integument   Rheumatoid arthritis (HCC)   Continue to follow with rheumatology. Call with any concerns.         Genitourinary   Benign hypertensive renal disease   Under good control on current regimen. Continue current regimen. Continue to monitor. Call with any concerns. Refills given. Labs drawn today.          Other   Hypercholesteremia   Under good control on current regimen.  Continue current regimen. Continue to monitor. Call with any concerns. Refills given. Labs drawn today.       Relevant Medications   simvastatin (ZOCOR) 40 MG tablet   amLODipine (NORVASC) 5 MG tablet   Morbid obesity (HCC)   Encouraged diet and exercise. Continue diet and exercise. Call with any concerns.       Relevant Medications   empagliflozin (JARDIANCE) 25 MG TABS tablet   insulin glargine, 1 Unit Dial, (TOUJEO SOLOSTAR) 300 UNIT/ML Solostar Pen   metFORMIN (GLUCOPHAGE) 1000 MG tablet   Invasive ductal carcinoma of breast, female, left (HCC)   Continue to follow with oncology. Call with any concerns.        Recurrent major depressive disorder, in full remission (HCC)   Under good control on current regimen. Continue current regimen. Continue to monitor. Call with any concerns. Refills given. Labs drawn today.        Relevant Medications   busPIRone (BUSPAR) 5 MG tablet   escitalopram (LEXAPRO) 20 MG tablet   Other Visit Diagnoses       Routine general medical examination at a health care facility    -  Primary   Vaccines up to date. Screening labs checked today. Mammo up to date. DEXA ordered. Colonoscopy ordered. Continue diet and exercise. Call with any concerns.     Screening for colon cancer       Referral to GI placed today.   Relevant Orders   Ambulatory referral to Gastroenterology     History of UTI       Will recheck UA. Await results.   Relevant Orders   Urinalysis, Routine w reflex microscopic        Follow up plan: Return in about 3 months (around 05/08/2024).   LABORATORY TESTING:  - Pap smear: not applicable  IMMUNIZATIONS:   - Tdap: Tetanus vaccination status reviewed: last tetanus booster within 10 years. - Influenza: Up to date - Pneumovax: Up to date - Prevnar: Up to date - COVID: Refused - HPV: Not applicable - Shingrix vaccine: Given elsewhere  SCREENING: -Mammogram: Up to date  - Colonoscopy: Up to date  - Bone Density: Ordered today   PATIENT COUNSELING:   Advised to take 1 mg of folate supplement per day if capable of pregnancy.   Sexuality: Discussed sexually transmitted diseases, partner selection, use of condoms, avoidance of unintended pregnancy  and contraceptive alternatives.   Advised to avoid cigarette smoking.  I discussed with the patient that most people either abstain from alcohol or drink within safe limits (<=14/week and <=4 drinks/occasion for males, <=7/weeks and <= 3 drinks/occasion for females) and that the risk for alcohol disorders and other health effects rises proportionally with the number of drinks per week and how often  a drinker exceeds daily limits.  Discussed cessation/primary prevention of drug use and availability of treatment for abuse.   Diet: Encouraged to adjust caloric intake to maintain  or achieve ideal body weight, to reduce intake of dietary saturated fat and total fat, to limit sodium intake by avoiding high sodium foods and not adding table salt, and to maintain adequate dietary potassium and calcium preferably from fresh fruits, vegetables, and low-fat dairy products.    stressed the importance of regular exercise  Injury prevention: Discussed safety belts, safety helmets, smoke detector, smoking near bedding or upholstery.   Dental health: Discussed importance of regular tooth brushing, flossing, and dental visits.    NEXT PREVENTATIVE PHYSICAL DUE IN 1 YEAR. Return in about  3 months (around 05/08/2024).

## 2024-02-06 NOTE — Assessment & Plan Note (Signed)
 Under good control on current regimen. Continue current regimen. Continue to monitor. Call with any concerns. Refills given. Labs drawn today.

## 2024-02-06 NOTE — Assessment & Plan Note (Signed)
Continue to follow with rheumatology. Call with any concerns.  

## 2024-02-07 ENCOUNTER — Other Ambulatory Visit: Payer: Self-pay | Admitting: Family Medicine

## 2024-02-07 LAB — COMPREHENSIVE METABOLIC PANEL
ALT: 16 IU/L (ref 0–32)
AST: 17 IU/L (ref 0–40)
Albumin: 4.1 g/dL (ref 3.9–4.9)
Alkaline Phosphatase: 39 IU/L — ABNORMAL LOW (ref 44–121)
BUN/Creatinine Ratio: 19 (ref 12–28)
BUN: 18 mg/dL (ref 8–27)
Bilirubin Total: 0.3 mg/dL (ref 0.0–1.2)
CO2: 24 mmol/L (ref 20–29)
Calcium: 9.1 mg/dL (ref 8.7–10.3)
Chloride: 103 mmol/L (ref 96–106)
Creatinine, Ser: 0.96 mg/dL (ref 0.57–1.00)
Globulin, Total: 2.3 g/dL (ref 1.5–4.5)
Glucose: 139 mg/dL — ABNORMAL HIGH (ref 70–99)
Potassium: 4.3 mmol/L (ref 3.5–5.2)
Sodium: 141 mmol/L (ref 134–144)
Total Protein: 6.4 g/dL (ref 6.0–8.5)
eGFR: 65 mL/min/{1.73_m2} (ref >59)

## 2024-02-07 LAB — CBC WITH DIFFERENTIAL/PLATELET
Basophils Absolute: 0 10*3/uL (ref 0.0–0.2)
Basos: 1 %
EOS (ABSOLUTE): 0.3 10*3/uL (ref 0.0–0.4)
Eos: 5 %
Hematocrit: 43.3 % (ref 34.0–46.6)
Hemoglobin: 13.3 g/dL (ref 11.1–15.9)
Immature Grans (Abs): 0 10*3/uL (ref 0.0–0.1)
Immature Granulocytes: 0 %
Lymphocytes Absolute: 1.7 10*3/uL (ref 0.7–3.1)
Lymphs: 34 %
MCH: 26.1 pg — ABNORMAL LOW (ref 26.6–33.0)
MCHC: 30.7 g/dL — ABNORMAL LOW (ref 31.5–35.7)
MCV: 85 fL (ref 79–97)
Monocytes Absolute: 0.4 10*3/uL (ref 0.1–0.9)
Monocytes: 8 %
Neutrophils Absolute: 2.6 10*3/uL (ref 1.4–7.0)
Neutrophils: 52 %
Platelets: 263 10*3/uL (ref 150–450)
RBC: 5.1 x10E6/uL (ref 3.77–5.28)
RDW: 14 % (ref 11.7–15.4)
WBC: 5 10*3/uL (ref 3.4–10.8)

## 2024-02-07 LAB — HGB A1C W/O EAG: Hgb A1c MFr Bld: 8.5 % — ABNORMAL HIGH (ref 4.8–5.6)

## 2024-02-07 LAB — LIPID PANEL W/O CHOL/HDL RATIO
Cholesterol, Total: 158 mg/dL (ref 100–199)
HDL: 60 mg/dL (ref >39)
LDL Chol Calc (NIH): 74 mg/dL (ref 0–99)
Triglycerides: 137 mg/dL (ref 0–149)
VLDL Cholesterol Cal: 24 mg/dL (ref 5–40)

## 2024-02-07 LAB — TSH: TSH: 1.96 u[IU]/mL (ref 0.450–4.500)

## 2024-02-07 NOTE — Telephone Encounter (Signed)
 Requested Prescriptions  Refused Prescriptions Disp Refills   amLODipine (NORVASC) 2.5 MG tablet [Pharmacy Med Name: AMLODIPINE BESYLATE 2.5 MG TAB] 90 tablet 1    Sig: TAKE 1 TABLET BY MOUTH ONCE DAILY     Cardiovascular: Calcium Channel Blockers 2 Passed - 02/07/2024  2:23 PM      Passed - Last BP in normal range    BP Readings from Last 1 Encounters:  02/06/24 138/62         Passed - Last Heart Rate in normal range    Pulse Readings from Last 1 Encounters:  02/06/24 83         Passed - Valid encounter within last 6 months    Recent Outpatient Visits           3 months ago Encounter for [employer] annual wellness exam   [place] [facility] [place], Megan P, DO   5 months ago Type 2 diabetes mellitus with obesity (HCC)   [place] [facility] [place], Megan P, DO   6 months ago Benign hypertensive renal disease   [place] [facility] [place], Megan P, DO   9 months ago Type 2 diabetes mellitus with hyperglycemia, with long-term current use of insulin (HCC)   [place] [facility], Megan P, DO   10 months ago Type 2 diabetes mellitus with hyperglycemia, with long-term current use of insulin [facility])   [place] [facility] [place], [place], DO

## 2024-02-08 ENCOUNTER — Encounter: Payer: Self-pay | Admitting: Family Medicine

## 2024-02-08 NOTE — Telephone Encounter (Signed)
 Dc'd 09/07/23 "reorder" Dr Laural Benes  Requested Prescriptions  Refused Prescriptions Disp Refills   amLODipine (NORVASC) 2.5 MG tablet [Pharmacy Med Name: AMLODIPINE BESYLATE 2.5 MG TAB] 90 tablet 1    Sig: TAKE 1 TABLET BY MOUTH ONCE DAILY     Cardiovascular: Calcium Channel Blockers 2 Passed - 02/08/2024 11:59 AM      Passed - Last BP in normal range    BP Readings from Last 1 Encounters:  02/06/24 138/62         Passed - Last Heart Rate in normal range    Pulse Readings from Last 1 Encounters:  02/06/24 83         Passed - Valid encounter within last 6 months    Recent Outpatient Visits           3 months ago Encounter for [employer] annual wellness exam   [place] [facility] [place], Megan P, DO   5 months ago Type 2 diabetes mellitus with obesity (HCC)   [place] [facility] [place], Megan P, DO   6 months ago Benign hypertensive renal disease   [place] [facility] [place], Megan P, DO   9 months ago Type 2 diabetes mellitus with hyperglycemia, with long-term current use of insulin (HCC)   [place], Megan P, DO   10 months ago Type 2 diabetes mellitus with hyperglycemia, with long-term current use of insulin [facility])   [place] [facility] [place], [place], DO

## 2024-02-09 ENCOUNTER — Telehealth: Payer: Self-pay | Admitting: Oncology

## 2024-02-09 NOTE — Telephone Encounter (Signed)
 Left VM regarding cancelled appt on 02/10/24. Pt did not have DEXA scan prior to appt, only mammo. Gave her number for [place] to schedule DEXA and instructions to call us back to reschedule

## 2024-02-10 ENCOUNTER — Inpatient Hospital Stay: Admitting: Oncology

## 2024-02-16 DIAGNOSIS — M5126 Other intervertebral disc displacement, lumbar region: Secondary | ICD-10-CM | POA: Diagnosis not present

## 2024-02-16 DIAGNOSIS — M5412 Radiculopathy, cervical region: Secondary | ICD-10-CM | POA: Diagnosis not present

## 2024-02-16 DIAGNOSIS — Z79899 Other long term (current) drug therapy: Secondary | ICD-10-CM | POA: Diagnosis not present

## 2024-02-16 DIAGNOSIS — M4802 Spinal stenosis, cervical region: Secondary | ICD-10-CM | POA: Diagnosis not present

## 2024-02-16 DIAGNOSIS — M47812 Spondylosis without myelopathy or radiculopathy, cervical region: Secondary | ICD-10-CM | POA: Diagnosis not present

## 2024-02-16 DIAGNOSIS — M5416 Radiculopathy, lumbar region: Secondary | ICD-10-CM | POA: Diagnosis not present

## 2024-03-01 ENCOUNTER — Ambulatory Visit
Admission: RE | Admit: 2024-03-01 | Discharge: 2024-03-01 | Disposition: A | Discharge status: Home / Self Care | Source: Ambulatory Visit | Attending: Family Medicine | Admitting: Family Medicine

## 2024-03-01 DIAGNOSIS — Z780 Asymptomatic menopausal state: Secondary | ICD-10-CM | POA: Insufficient documentation

## 2024-03-02 ENCOUNTER — Encounter: Payer: Self-pay | Admitting: Family Medicine

## 2024-03-19 ENCOUNTER — Other Ambulatory Visit: Payer: Disability Insurance

## 2024-03-20 ENCOUNTER — Other Ambulatory Visit: Payer: Disability Insurance

## 2024-03-23 ENCOUNTER — Other Ambulatory Visit: Payer: Self-pay

## 2024-03-23 NOTE — Progress Notes (Signed)
   03/23/2024  Patient ID: Sierra Copeland, female   DOB: 1956/11/09, 68 y.o.   MRN: [id number]  Subjective/Objective Telephone visit to follow-up on management of hypertension and diabetes  Diabetes -Current medications:  metformin  1000mg  BID, Humalog  10 units TID with meals, Toujeo  300unit/mL 60 units daily, Jardiance  25mg  daily -Using Libre 3 for CGM -Endorses FBG 110-130 -A1c 8.5% in March  Hypertension -Current medications:  amlodipine  5mg  daily, lisinopril  40mg  daily  -Patient has not used OTC insurance credit to purchase home BP monitor yet -BP at last OV 136/862  Assessment/Plan  Diabetes -Uncontrolled with A1c >7%, but BG readings provided reflect improved control -Continue current regimen at this time and use of CGM -Send invite to link [place] data with [facility] [employer] Dashboard -Sees PCP again in June and will be due for A1c  Hypertension -Moderately controlled -Continue current regiment at this time -Encouraged patient to use OTC credit with insurance to obtain home monitor, so these funds to not go to waste -Recommend checking/recording home BP at least 3x/week -Continue current regimen at this time  Follow-up: 3 months  Linn Rich, PharmD, DPLA

## 2024-04-05 ENCOUNTER — Other Ambulatory Visit: Payer: Self-pay | Admitting: Family Medicine

## 2024-04-17 ENCOUNTER — Inpatient Hospital Stay: Admitting: Oncology

## 2024-04-19 ENCOUNTER — Ambulatory Visit: Payer: Self-pay

## 2024-04-19 NOTE — Telephone Encounter (Signed)
 Chief Complaint:  Urinary symptoms  Symptoms:  Pain during urination and intermittently urinary retention.  Onset  Today    Patient denies  -Fever, new flank pain, blood in urine, severe pain, vomiting, weakness, dizziness, s/s of dehydration   Disposition: [ ] ED /[ ] Urgent Care (no appt availability in office) / [facility] ]Appointment(In office/virtual)/ [ ]  [place] [facility]/ [ ] Home Care/ [ ] Refused Recommended Disposition /[ ] [place] [facility]/ [ ]  Follow-up with PCP   Additional Notes:  Appointment scheduled for patient appropriately.  Patient educated on pertinent s/s that would warrant emergent help/call 911/ ED/UC.  Patient verbalized understanding and agrees with plan No additional questions/concerns noted during the time of the call.     Complete triage note below:      Copied from CRM [id number]. Topic: Clinical - Red Word Triage >> Apr 19, 2024  4:21 PM Chrystal Crape R wrote: Pt calling for appt, pain during Urination Reason for Disposition  Age > 50 years  Answer Assessment - Initial Assessment Questions 1. SYMPTOM: "What's the main symptom you're concerned about?" (e.g., frequency, incontinence)     --- Pain during urination and urinary rentention    2. ONSET: "When did the   start?"     - Today    3. PAIN: "Is there any pain?" If Yes, ask: "How bad is it?" (Scale: 1-10; mild, moderate, severe)     --- Pain urethra: 5/10    4. CAUSE: "What do you think is causing the symptoms?"   ---- Thinks it may be a UTI    5. OTHER SYMPTOMS: "Do you have any other symptoms?" (e.g., blood in urine, fever, flank pain, pain with urination)     --- Flank pain- Has chronic back pain  Protocols used: Urinary Symptoms-A-AH, Urination Pain - Female-A-AH

## 2024-04-20 ENCOUNTER — Ambulatory Visit (INDEPENDENT_AMBULATORY_CARE_PROVIDER_SITE_OTHER): Admitting: Nurse Practitioner

## 2024-04-20 ENCOUNTER — Encounter: Payer: Self-pay | Admitting: Nurse Practitioner

## 2024-04-20 ENCOUNTER — Ambulatory Visit: Payer: Self-pay | Admitting: Nurse Practitioner

## 2024-04-20 VITALS — BP 155/81 | HR 75 | Temp 98.0°F | Ht 61.0 in | Wt 206.6 lb

## 2024-04-20 DIAGNOSIS — R1030 Lower abdominal pain, unspecified: Secondary | ICD-10-CM | POA: Diagnosis not present

## 2024-04-20 DIAGNOSIS — R8281 Pyuria: Secondary | ICD-10-CM | POA: Diagnosis not present

## 2024-04-20 LAB — MICROALBUMIN, URINE WAIVED
Creatinine, Urine Waived: 100 mg/dL (ref 10–300)
Microalb, Ur Waived: 80 mg/L — ABNORMAL HIGH (ref 0–19)

## 2024-04-20 LAB — URINALYSIS, ROUTINE W REFLEX MICROSCOPIC
Bilirubin, UA: NEGATIVE
Nitrite, UA: NEGATIVE
Protein,UA: NEGATIVE
Specific Gravity, UA: 1.02 (ref 1.005–1.030)
Urobilinogen, Ur: 0.2 mg/dL (ref 0.2–1.0)
pH, UA: 5.5 (ref 5.0–7.5)

## 2024-04-20 LAB — WET PREP FOR TRICH, YEAST, CLUE
Clue Cell Exam: NEGATIVE
Trichomonas Exam: NEGATIVE
Yeast Exam: NEGATIVE

## 2024-04-20 LAB — MICROSCOPIC EXAMINATION: Bacteria, UA: NONE SEEN

## 2024-04-20 MED ORDER — AMOXICILLIN-POT CLAVULANATE 875-125 MG PO TABS: 1.0000 | ORAL_TABLET | Freq: Two times a day (BID) | ORAL | 0 refills | Status: AC

## 2024-04-20 NOTE — Assessment & Plan Note (Signed)
 Acute since this morning, reports presenting similarly with UTIs.  UA showing leuks present, nitrites negative.  2+ glucose, takes Jardiance .  Will send for culture and start Augmentin  BID for 5 days, discussed with her if culture shows need to stop or change antibiotic will alert her.  Obtain wet prep as well and treat as needed.

## 2024-04-20 NOTE — Progress Notes (Signed)
 BP (!) 155/81   Pulse 75   Temp 98 F (36.7 C) (Oral)   Ht 5\' 1"  (1.549 m)   Wt 206 lb 9.6 oz (93.7 kg)   LMP 03/17/2013 (Approximate)   SpO2 97%   BMI 39.04 kg/m    Subjective:    Patient ID: Sierra Copeland, female    DOB: 1956/02/06, 68 y.o.   MRN: [id number]  HPI: Sierra Copeland is a 68 y.o. female  Chief Complaint  Patient presents with   Abdominal Pain    Patient states she woke up this morning with lower abdominal pain. States the pain feels like cramping.    URINARY SYMPTOMS Woke up this morning with abdominal pain and is concerned about UTI.  Cramping pain this morning.  Last UTI in February and was treated with Macrobid .  Takes Jardiance  for her diabetes. Reports previous UTIs have started like this. Dysuria: no Urinary frequency: no Urgency: no Small volume voids: no Symptom severity: no Urinary incontinence: no Foul odor: no Hematuria: no Abdominal pain: yes Back pain: yes at baseline Suprapubic pain/pressure: yes Flank pain: no Fever:  no Vomiting: no Status: stable Previous urinary tract infection: yes  Recurrent urinary tract infection: no Sexual activity: No sexually active Treatments attempted: increasing fluids    Relevant past medical, surgical, family and social history reviewed and updated as indicated. Interim medical history since our last visit reviewed. Allergies and medications reviewed and updated.  Review of Systems  Constitutional:  Positive for fatigue. Negative for activity change, appetite change, diaphoresis and fever.  Respiratory:  Negative for cough, chest tightness, shortness of breath and wheezing.   Cardiovascular:  Negative for chest pain, palpitations and leg swelling.  Gastrointestinal:  Positive for abdominal pain. Negative for abdominal distention, constipation, diarrhea, nausea and vomiting.  Genitourinary:  Negative for decreased urine volume, difficulty urinating, dysuria, flank pain, hematuria and urgency.   Musculoskeletal:  Positive for back pain (at baseline).  Neurological: Negative.   Psychiatric/Behavioral: Negative.      Per HPI unless specifically indicated above     Objective:     BP (!) 155/81   Pulse 75   Temp 98 F (36.7 C) (Oral)   Ht 5\' 1"  (1.549 m)   Wt 206 lb 9.6 oz (93.7 kg)   LMP 03/17/2013 (Approximate)   SpO2 97%   BMI 39.04 kg/m   Wt Readings from Last 3 Encounters:  04/20/24 206 lb 9.6 oz (93.7 kg)  02/06/24 206 lb 3.2 oz (93.5 kg)  01/20/24 206 lb (93.4 kg)    Physical Exam Vitals and nursing note reviewed.  Constitutional:      General: She is awake. She is not in acute distress.    Appearance: She is well-developed and well-groomed. She is obese. She is not ill-appearing or toxic-appearing.  HENT:     Head: Normocephalic.     Right Ear: Hearing and external ear normal.     Left Ear: Hearing and external ear normal.  Eyes:     General: Lids are normal.        Right eye: No discharge.        Left eye: No discharge.     Conjunctiva/sclera: Conjunctivae normal.     Pupils: Pupils are equal, round, and reactive to light.  Neck:     Thyroid : No thyromegaly.     Vascular: No carotid bruit.  Cardiovascular:     Rate and Rhythm: Normal rate and regular rhythm.     Heart  sounds: Normal heart sounds. No murmur heard.    No gallop.  Pulmonary:     Effort: Pulmonary effort is normal. No accessory muscle usage or respiratory distress.     Breath sounds: Normal breath sounds.  Abdominal:     General: Bowel sounds are normal. There is no distension.     Palpations: Abdomen is soft.     Tenderness: There is no abdominal tenderness. There is no right CVA tenderness or left CVA tenderness.  Musculoskeletal:     Cervical back: Normal range of motion and neck supple.     Right lower leg: No edema.     Left lower leg: No edema.  Lymphadenopathy:     Cervical: No cervical adenopathy.  Skin:    General: Skin is warm and dry.  Neurological:     Mental  Status: She is alert and oriented to person, place, and time.     Deep Tendon Reflexes: Reflexes are normal and symmetric.     Reflex Scores:      Brachioradialis reflexes are 2+ on the right side and 2+ on the left side.      Patellar reflexes are 2+ on the right side and 2+ on the left side. Psychiatric:        Attention and Perception: Attention normal.        Mood and Affect: Mood normal.        Speech: Speech normal.        Behavior: Behavior normal. Behavior is cooperative.        Thought Content: Thought content normal.    Results for orders placed or performed in visit on 02/06/24  CBC with Differential/Platelet   Collection Time: 02/06/24 10:06 AM  Result Value Ref Range   WBC 5.0 3.4 - 10.8 x10E3/uL   RBC 5.10 3.77 - 5.28 x10E6/uL   Hemoglobin 13.3 11.1 - 15.9 g/dL   Hematocrit 16.1 09.6 - 46.6 %   MCV 85 79 - 97 fL   MCH 26.1 (L) 26.6 - 33.0 pg   MCHC 30.7 (L) 31.5 - 35.7 g/dL   RDW 04.5 40.9 - 81.1 %   Platelets 263 150 - 450 x10E3/uL   Neutrophils 52 Not Estab. %   Lymphs 34 Not Estab. %   Monocytes 8 Not Estab. %   Eos 5 Not Estab. %   Basos 1 Not Estab. %   Neutrophils Absolute 2.6 1.4 - 7.0 x10E3/uL   Lymphocytes Absolute 1.7 0.7 - 3.1 x10E3/uL   Monocytes Absolute 0.4 0.1 - 0.9 x10E3/uL   EOS (ABSOLUTE) 0.3 0.0 - 0.4 x10E3/uL   Basophils Absolute 0.0 0.0 - 0.2 x10E3/uL   Immature Granulocytes 0 Not Estab. %   Immature Grans (Abs) 0.0 0.0 - 0.1 x10E3/uL  Comprehensive metabolic panel   Collection Time: 02/06/24 10:06 AM  Result Value Ref Range   Glucose 139 (H) 70 - 99 mg/dL   BUN 18 8 - 27 mg/dL   Creatinine, Ser 9.14 0.57 - 1.00 mg/dL   eGFR 65 >78 GN/FAO/1.30   BUN/Creatinine Ratio 19 12 - 28   Sodium 141 134 - 144 mmol/L   Potassium 4.3 3.5 - 5.2 mmol/L   Chloride 103 96 - 106 mmol/L   CO2 24 20 - 29 mmol/L   Calcium 9.1 8.7 - 10.3 mg/dL   Total Protein 6.4 6.0 - 8.5 g/dL   Albumin 4.1 3.9 - 4.9 g/dL   Globulin, Total 2.3 1.5 - 4.5 g/dL    Bilirubin Total 0.3  0.0 - 1.2 mg/dL   Alkaline Phosphatase 39 (L) 44 - 121 IU/L   AST 17 0 - 40 IU/L   ALT 16 0 - 32 IU/L  Lipid Panel w/o Chol/HDL Ratio   Collection Time: 02/06/24 10:06 AM  Result Value Ref Range   Cholesterol, Total 158 100 - 199 mg/dL   Triglycerides 161 0 - 149 mg/dL   HDL 60 >09 mg/dL   VLDL Cholesterol Cal 24 5 - 40 mg/dL   LDL Chol Calc (NIH) 74 0 - 99 mg/dL  TSH   Collection Time: 02/06/24 10:06 AM  Result Value Ref Range   TSH 1.960 0.450 - 4.500 uIU/mL  Hgb A1c w/o eAG   Collection Time: 02/06/24 10:06 AM  Result Value Ref Range   Hgb A1c MFr Bld 8.5 (H) 4.8 - 5.6 %      Assessment & Plan:   Problem List Items Addressed This Visit       Other   Lower abdominal pain - Primary   Acute since this morning, reports presenting similarly with UTIs.  UA showing leuks present, nitrites negative.  2+ glucose, takes Jardiance .  Will send for culture and start Augmentin  BID for 5 days, discussed with her if culture shows need to stop or change antibiotic will alert her.  Obtain wet prep as well and treat as needed.      Relevant Orders   Urinalysis, Routine w reflex microscopic   WET PREP FOR TRICH, YEAST, CLUE   Microalbumin, Urine Waived   Other Visit Diagnoses       Pyuria       Urine sent for culture.   Relevant Orders   Urine Culture        Follow up plan: Return if symptoms worsen or fail to improve.

## 2024-04-20 NOTE — Progress Notes (Signed)
 Contacted via MyChart   Wet prep all negative:)

## 2024-04-20 NOTE — Patient Instructions (Signed)

## 2024-04-22 LAB — URINE CULTURE

## 2024-04-30 ENCOUNTER — Inpatient Hospital Stay: Admitting: Oncology

## 2024-05-08 ENCOUNTER — Inpatient Hospital Stay: Attending: Oncology | Admitting: Oncology

## 2024-05-08 ENCOUNTER — Encounter: Payer: Self-pay | Admitting: Oncology

## 2024-05-08 VITALS — BP 174/84 | HR 65 | Temp 97.4°F | Resp 16 | Ht 61.0 in | Wt 206.8 lb

## 2024-05-08 DIAGNOSIS — Z171 Estrogen receptor negative status [ER-]: Secondary | ICD-10-CM | POA: Insufficient documentation

## 2024-05-08 DIAGNOSIS — C50412 Malignant neoplasm of upper-outer quadrant of left female breast: Secondary | ICD-10-CM | POA: Diagnosis not present

## 2024-05-08 DIAGNOSIS — C50912 Malignant neoplasm of unspecified site of left female breast: Secondary | ICD-10-CM

## 2024-05-08 DIAGNOSIS — Z87891 Personal history of nicotine dependence: Secondary | ICD-10-CM | POA: Insufficient documentation

## 2024-05-08 DIAGNOSIS — Z1732 Human epidermal growth factor receptor 2 negative status: Secondary | ICD-10-CM | POA: Diagnosis not present

## 2024-05-08 DIAGNOSIS — Z1509 Genetic susceptibility to other malignant neoplasm: Secondary | ICD-10-CM | POA: Insufficient documentation

## 2024-05-08 DIAGNOSIS — Z923 Personal history of irradiation: Secondary | ICD-10-CM | POA: Diagnosis not present

## 2024-05-08 DIAGNOSIS — Z800 Family history of malignant neoplasm of digestive organs: Secondary | ICD-10-CM | POA: Diagnosis not present

## 2024-05-08 DIAGNOSIS — M8580 Other specified disorders of bone density and structure, unspecified site: Secondary | ICD-10-CM | POA: Diagnosis not present

## 2024-05-08 DIAGNOSIS — Z803 Family history of malignant neoplasm of breast: Secondary | ICD-10-CM | POA: Diagnosis not present

## 2024-05-08 DIAGNOSIS — Z1722 Progesterone receptor negative status: Secondary | ICD-10-CM | POA: Diagnosis not present

## 2024-05-08 NOTE — Progress Notes (Signed)
  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  She currently feels well and is asymptomatic.  She has no neurologic complaints.  She denies any recent fevers or illnesses.  She has a good appetite and denies weight loss.  She has no chest pain, shortness of breath, cough, or hemoptysis.  She denies any nausea, vomiting, constipation, diarrhea.  She has no melena or hematochezia.  She has no urinary complaints.  Patient offers no specific complaints today.  REVIEW OF SYSTEMS:   Review of Systems  Constitutional: Negative.  Negative for fever, malaise/fatigue and weight loss.  Respiratory: Negative.  Negative for cough, hemoptysis and shortness of breath.   Cardiovascular: Negative.  Negative for chest pain and leg swelling.  Gastrointestinal: Negative.  Negative for abdominal pain.  Genitourinary: Negative.  Negative for dysuria.  Musculoskeletal: Negative.  Negative for back pain.  Skin: Negative.  Negative for rash.  Neurological: Negative.  Negative for dizziness, focal weakness, weakness and headaches.  Psychiatric/Behavioral: Negative.  The patient is not nervous/anxious.     As per HPI. Otherwise, a complete review of systems is negative.  PAST MEDICAL HISTORY: Past Medical History:  Diagnosis Date   Abnormal mammogram    Anxiety    Arthritis    Back pain    spinal stenosis and neck bone spur   Cancer (HCC)    Breast   Car sickness    Collagenous colitis     Depression    Diabetes mellitus without complication (HCC)    GERD (gastroesophageal reflux disease)    occ-no meds   Hair loss    TAKING THYROTAIN   Headache    migraines   Hyperlipemia    Hypertension    Osteoarthritis    seen by Rheum, not enoug evidence to support RA   Pinched nerve    PONV (postoperative nausea and vomiting)    after tonsillectomy   Rosacea    Seborrheic dermatitis    Sleep apnea    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 Tumor is triple negative. She is dated radiation in August 2018. Continues to have problems with lymphedema   Wears glasses     PAST SURGICAL HISTORY: Past Surgical History:  Procedure Laterality Date   ACHILLES TENDON REPAIR  2007   right   BREAST LUMPECTOMY  2018   BREAST LUMPECTOMY Left 03/2017   BREAST SURGERY  2011   lt br bx-non cancer   CARPAL TUNNEL RELEASE  1989   right and left   CHOLECYSTECTOMY N/A 09/06/2017   Gallbladder not removed due to complications. Procedure: LAPAROSCOPY;  Surgeon: Benancio Bracket, MD;  Location: [facility];  Service: General;  Laterality: N/A;   COLONOSCOPY     COLONOSCOPY WITH PROPOFOL  N/A 09/24/2021   Procedure: COLONOSCOPY WITH PROPOFOL ;  Surgeon: Quintin Buckle, DO;  Location: [facility];  Service: Gastroenterology;  Laterality: N/A;  IDDM   epidural steroid injection x2  2017   ESOPHAGOGASTRODUODENOSCOPY N/A 01/21/2016   Procedure: ESOPHAGOGASTRODUODENOSCOPY (  EGD);  Surgeon: Stephens Eis, MD;  Location: [facility];  Service: Gastroenterology;  Laterality: N/A;  Please call at work [phone] Diabetic - insulin    ESOPHAGOGASTRODUODENOSCOPY N/A 09/24/2021   Procedure: ESOPHAGOGASTRODUODENOSCOPY (EGD);  Surgeon: Quintin Buckle, DO;  Location: [facility]COPY;  Service: Gastroenterology;  Laterality: N/A;   FINGER EXPLORATION  2013   left long    Lynch's Syndrome     MASTECTOMY Left 2018   Partial   NERVE EXPLORATION  Left 10/03/2013   Procedure: NERVE EXPLORATION/ LEFT LONG RADIAL DISTAL NERVE neurolysis;  Surgeon: Hedy Living, MD;  Location: [facility];  Service: Orthopedics;  Laterality: Left;   SHOULDER ARTHROSCOPY WITH DEBRIDEMENT AND BICEP TENDON REPAIR Right 05/13/2016   Procedure: Arthroscopic debridement, open decompression, rotator cuff repair, tenodesis,right shoulder.;  Surgeon: Elner Hahn, MD;  Location: [facility];  Service: Orthopedics;  Laterality: Right;   TONSILLECTOMY  2000   UVULOPALATOPHARYNGOPLASTY  1998   and tonsils-tongue pexi    BREAST BIOPSY Left 03/14/2017       FAMILY HISTORY: Family History  Problem Relation Age of Onset   Liver cancer Mother 42       gallbaldder   Hypertension Mother    Hyperlipidemia Mother    Hyperlipidemia Father    Hypertension Father    Diabetes Maternal Aunt    Diabetes Maternal Uncle    Heart disease Maternal Uncle    Breast cancer Paternal Aunt    Diabetes Paternal Aunt    Breast cancer Paternal Aunt    Diabetes Paternal Uncle    Dementia Maternal Grandmother    Stroke Maternal Grandfather    Diabetes Paternal Grandmother    Heart disease Paternal Grandfather    Other Cousin 38       BRCA or similar mutation positive    ADVANCED DIRECTIVES (Y/N):  N  HEALTH MAINTENANCE: Social History   Tobacco Use   Smoking status: Former    Current packs/day: 0.00    Average packs/day: 1 pack/day for 10.0 years (10.0 ttl pk-yrs)    Types: Cigarettes    Start date: 09/28/1976    Quit date: 09/28/1986    Years since quitting: 37.6   Smokeless tobacco: Never  Vaping Use   Vaping status: Never Used  Substance Use Topics   Alcohol use: No   Drug use: No     Colonoscopy:  PAP:  Bone density:  Lipid panel:  Allergies  Allergen Reactions   Mounjaro  [Tirzepatide ]     GI upset   Ozempic  (0.25 Or 0.5 Mg-Dose) [Semaglutide (0.25 Or 0.5mg -Dos)] Other (See Comments)    GI upset   Prednisone  Palpitations    Increases  BG and HR.    Current Outpatient Medications  Medication Sig Dispense Refill   acetaminophen  (TYLENOL ) 650 MG CR tablet Take 650 mg by mouth every 8 (eight) hours as needed for pain.     amLODipine  (NORVASC ) 5 MG tablet Take 1 tablet (5 mg total) by mouth daily. 90 tablet 1   aspirin-acetaminophen -caffeine (EXCEDRIN MIGRAINE) 250-250-65 MG tablet Take 1 tablet by mouth every 6 (six) hours as needed for headache.     busPIRone  (BUSPAR ) 5 MG tablet Take 1 tablet (5 mg total) by mouth 2 (two) times daily. 60 tablet 5   Cholecalciferol (VITAMIN D3) 2000 units capsule Take 2,000 Units by mouth at bedtime.      Coenzyme Q10 (COQ-10) 100 MG CAPS Take 100 mg by mouth daily.     Continuous Glucose Receiver (FREESTYLE  LIBRE 3 READER) DEVI 1 kit by Does not apply route daily. 1 each 0   Continuous Glucose Sensor (FREESTYLE LIBRE 3 SENSOR) MISC USE 1 SENSOR ON THE SKIN EVERY 14 DAYS. USE TO CHECK BLOOD GLUCOSE CONTINUOUSLY 104 each 0   empagliflozin  (JARDIANCE ) 25 MG TABS tablet Take 1 tablet (25 mg total) by mouth daily. 90 tablet 1   escitalopram  (LEXAPRO ) 20 MG tablet Take 1 tablet (20 mg total) by mouth daily. 90 tablet 1   fexofenadine (ALLEGRA) 180 MG tablet Take 180 mg by mouth daily with breakfast.      fluticasone (FLONASE) 50 MCG/ACT nasal spray Place 1-2 sprays into both nostrils every evening.      gabapentin  (NEURONTIN ) 100 MG capsule Take 2 capsules (200 mg total) by mouth 2 (two) times daily. 360 capsule 1   Glucagon  (GVOKE HYPOPEN  2-PACK) 1 MG/0.2ML SOAJ Inject 1 each into the skin as needed. 0.2 mL 12   Hyoscyamine  Sulfate SL 0.125 MG SUBL Place 0.125 mg under the tongue daily as needed (cramping). 120 tablet 3   insulin  glargine, 1 Unit Dial , (TOUJEO  SOLOSTAR) 300 UNIT/ML Solostar Pen INJECT 60 UNITS SUBCUTANEOUSLY AT BEDTIME AND 20 UNITS AT LUNCH TIME 9 mL 1   insulin  lispro (HUMALOG  KWIKPEN) 100 UNIT/ML KwikPen INJECT 10-20 UNITS SUBCUTANEOUSLY WITH BREAKFAST  WITH LUNCH AND DINNER 54 mL  1   Insulin  Syringe-Needle U-100 30G X 5/16" 0.3 ML MISC Inject 1 each into the skin 3 (three) times daily. 500 each 12   leflunomide (ARAVA) 10 MG tablet Take 1 tablet by mouth daily.     lisinopril  (ZESTRIL ) 40 MG tablet TAKE 1 TABLET BY MOUTH ONCE DAILY 90 tablet 1   Magnesium 250 MG TABS Take 250 mg by mouth daily.     metFORMIN  (GLUCOPHAGE ) 1000 MG tablet Take 1 tablet (1,000 mg total) by mouth 2 (two) times daily with a meal. 180 tablet 1   omeprazole  (PRILOSEC) 40 MG capsule TAKE 1 CAPSULE BY MOUTH ONCE DAILY 90 capsule 2   ondansetron  (ZOFRAN ) 4 MG tablet Take 1 tablet (4 mg total) by mouth every 8 (eight) hours as needed for nausea or vomiting. 20 tablet 3   simvastatin  (ZOCOR ) 40 MG tablet Take 1 tablet (40 mg total) by mouth at bedtime. 90 tablet 1   tiZANidine (ZANAFLEX) 2 MG tablet Take 2 mg by mouth at bedtime.      traMADol  (ULTRAM ) 50 MG tablet Take 100 mg by mouth 2 (two) times daily.     ULTRACARE PEN NEEDLES 33G X 4 MM MISC USE AS DIRECTED. TWICE DAILY 100 each 12   zinc gluconate 50 MG tablet Take 50 mg by mouth daily.     Current Facility-Administered Medications  Medication Dose Route Frequency Provider Last Rate Last Admin   triamcinolone  acetonide (KENALOG -40) injection 40 mg  40 mg Intramuscular Once [place], Megan P, DO        OBJECTIVE: Vitals:   05/08/24 1423  BP: (!) 174/84  Pulse: 65  Resp: 16  Temp: (!) 97.4 F (36.3 C)  SpO2: 99%     Body mass index is 39.07 kg/m.    ECOG FS:0 - Asymptomatic  General: Well-developed, well-nourished, no acute distress. Eyes: Pink conjunctiva, anicteric sclera. HEENT: Normocephalic, moist mucous membranes. Lungs: No audible wheezing or coughing. Heart: Regular rate and rhythm. Abdomen: Soft, nontender, no obvious distention. Musculoskeletal: No edema, cyanosis, or clubbing. Neuro: Alert, answering all questions appropriately. Cranial nerves grossly intact. Skin: No rashes or petechiae noted.  Psych: Normal  affect.  LAB RESULTS:  Lab Results  Component Value Date   NA 141 02/06/2024   K 4.3 02/06/2024   CL 103 02/06/2024   CO2 24 02/06/2024   GLUCOSE 139 (H) 02/06/2024   BUN 18 02/06/2024   CREATININE 0.96 02/06/2024   CALCIUM 9.1 02/06/2024   PROT 6.4 02/06/2024   ALBUMIN 4.1 02/06/2024   AST 17 02/06/2024   ALT 16 02/06/2024   ALKPHOS 39 (L) 02/06/2024   BILITOT 0.3 02/06/2024   GFRNONAA >60 02/19/2022   GFRAA 70 12/01/2020    Lab Results  Component Value Date   WBC 5.0 02/06/2024   NEUTROABS 2.6 02/06/2024   HGB 13.3 02/06/2024   HCT 43.3 02/06/2024   MCV 85 02/06/2024   PLT 263 02/06/2024     STUDIES: No results found.  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  She underwent lumpectomy in May 2018 and completed adjuvant XRT in June 2018.  Patient reports chemotherapy was discussed, but she ultimately declined treatment.  She did not require an aromatase inhibitor.  Her most recent mammogram on October 20, 2023 was reported as BI-RADS 1.  Repeat in November 2025.  Return to clinic in 1 year for routine evaluation.   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  Patient's last colonoscopy was on September 24, 2021.  She will be due for a repeat colonoscopy in October 2025 and has a follow-up with GI in the near future to schedule the procedure.   Endometrial cancer risk: Recommend follow-up with gynecology with Pap smear.   Osteopenia: Bone mineral density completed on March 01, 2024 revealed a T-score of -1.3.  Recommended calcium and vitamin D supplementation.  Repeat in April 2027.    Patient expressed  understanding and was in agreement with this plan. She also understands that She can call clinic at any time with any questions, concerns, or complaints.    Cancer Staging  Invasive ductal carcinoma of breast, female, left [facility]) Staging form: Breast, AJCC 8th Edition - Pathologic stage from 02/02/2022: Stage IB (pT1c, pN0, cM0, G3, ER-, PR-, HER2-) - Signed by Shellie Dials, MD on 02/02/2022 Stage prefix: Initial diagnosis Histologic grading system: 3 grade system   Shellie Dials, MD   05/08/2024 2:37 PM

## 2024-05-11 ENCOUNTER — Ambulatory Visit: Admitting: Family Medicine

## 2024-05-24 ENCOUNTER — Telehealth: Payer: Self-pay | Admitting: Family Medicine

## 2024-05-24 NOTE — Telephone Encounter (Signed)
 Called patient to reschedule her appt left message on machine that her appt has been canceled and rescheduled. If she needs medication please put in a message and we will refill her meds until her next appt.

## 2024-05-25 ENCOUNTER — Ambulatory Visit: Admitting: Family Medicine

## 2024-05-25 NOTE — Telephone Encounter (Signed)
 No refills appear to be needed at this time.

## 2024-06-06 ENCOUNTER — Other Ambulatory Visit: Payer: Self-pay | Admitting: Family Medicine

## 2024-06-06 DIAGNOSIS — E1165 Type 2 diabetes mellitus with hyperglycemia: Secondary | ICD-10-CM

## 2024-06-06 DIAGNOSIS — Z794 Long term (current) use of insulin: Secondary | ICD-10-CM

## 2024-06-08 NOTE — Telephone Encounter (Signed)
 Requested Prescriptions  Pending Prescriptions Disp Refills   Continuous Glucose Sensor (FREESTYLE LIBRE 3 SENSOR) MISC [Pharmacy Med Name: FREESTYLE LIBRE 3 SENSOR] 104 each 0    Sig: USE 1 SENSOR ON THE SKIN EVERY 14 DAYS AS DIRECTED     Endocrinology: Diabetes - Testing Supplies Passed - 06/08/2024  1:37 PM      Passed - Valid encounter within last 12 months    Recent Outpatient Visits           1 month ago Lower abdominal pain   [place] [facility] [place], Melanie T, NP   4 months ago Routine general medical examination at a health care facility   [facility] [place], [place] P, DO   4 months ago Acute cystitis with hematuria   [place] [facility] Melvin Pao, NP

## 2024-06-12 ENCOUNTER — Telehealth: Payer: Self-pay | Admitting: Pharmacy Technician

## 2024-06-12 NOTE — Progress Notes (Signed)
.    06/12/2024 Name: Sierra Copeland MRN: [id number] DOB: August 29, 1956  Patient is appearing on a report for [employer] [place] [employer] Diabetes and last engaged with the clinical pharmacist to discuss diabetes on 03/23/2024. Contacted patient today to discuss diabetes management and completed medication review.   Diabetes Plan from last clinical pharmacist appointment:  Diabetes -Uncontrolled with A1c >7%, but BG readings provided reflect improved control -Continue current regimen at this time and use of CGM -Send invite to link [place] data with [facility] LibreView Dashboard -Sees PCP again in June and will be due for A1c   Medication Adherence Barriers Identified:  Patient made recommended medication changes per plan: Yes Patient informs she takes the follow medications for her diabetes. She informs she takes Metformin  1000mg  twice a day, Humalog  10 units 3 times a day, Toujeo  60 units at night, Jardiance  25mg  daily Access issues with any new medication or testing device: No Patient informs medications are affordable at this time and she has a supply on hand. Per Dr Annemarie Metformin  was filled for 90 day supply on 4/19, Humalog  90 day supply on 5/12, Toujeo  30 day supply on 6/17, Jardiance  90 day supply on 3/26 and Libre sensors 90 day supply on 7/11.  Patient is checking blood sugars as prescribed: Yes Patient use the FreeStyle Libre to check blood sugars. She informs her blood sugars stay mainly between 150-160 for most of the day. Last A1C in March was 8.5 She informs her appointments In June had to be canceled a few times. She informs she has an appointment in August with Dr Vicci.  Medication Adherence Barriers Addressed/Actions Taken:  Reviewed medication changes per plan from last clinical pharmacist note  Educated patient to contact pharmacy regarding new prescriptions and refills  Reviewed instructions for monitoring blood sugars at home and reminded patient to keep a written log to review with  pharmacist Reminded patient of date/time of upcoming clinical pharmacist follow up and any upcoming PCP/specialists visits. Patient denies transportation barriers to the appointment. Yes  Next clinical pharmacist appointment is scheduled for: 06/22/2024  Joven Mom, CPhT [facility] Office: [phone] Email: [email or web address]

## 2024-06-14 LAB — HM DIABETES EYE EXAM

## 2024-06-15 DIAGNOSIS — Z794 Long term (current) use of insulin: Secondary | ICD-10-CM | POA: Diagnosis not present

## 2024-06-15 DIAGNOSIS — G8929 Other chronic pain: Secondary | ICD-10-CM | POA: Diagnosis not present

## 2024-06-15 DIAGNOSIS — E1142 Type 2 diabetes mellitus with diabetic polyneuropathy: Secondary | ICD-10-CM | POA: Diagnosis not present

## 2024-06-15 DIAGNOSIS — M25561 Pain in right knee: Secondary | ICD-10-CM | POA: Diagnosis not present

## 2024-06-19 ENCOUNTER — Other Ambulatory Visit: Payer: Self-pay | Admitting: Nurse Practitioner

## 2024-06-19 ENCOUNTER — Other Ambulatory Visit: Payer: Self-pay | Admitting: Family Medicine

## 2024-06-21 NOTE — Telephone Encounter (Signed)
 Requested Prescriptions  Pending Prescriptions Disp Refills   TOUJEO  SOLOSTAR 300 UNIT/ML Solostar Pen [Pharmacy Med Name: TOUJEO  SOLOSTAR 300 UNIT/ML SUBQ SO] 9 mL 1    Sig: INJECT 60 UNITS SUBCUTANEOUSLY AT BEDTIME AND 20 UNITS AT LUNCH TIME     Endocrinology:  Diabetes - Insulins Failed - 06/21/2024  2:43 PM      Failed - HBA1C is between 0 and 7.9 and within 180 days    Hemoglobin A1C  Date Value Ref Range Status  01/30/2020 9.6  Final   HB A1C (BAYER DCA - WAIVED)  Date Value Ref Range Status  11/08/2023 9.0 (H) 4.8 - 5.6 % Final    Comment:             Prediabetes: 5.7 - 6.4          Diabetes: >6.4          Glycemic control for adults with diabetes: <7.0    Hgb A1c MFr Bld  Date Value Ref Range Status  02/06/2024 8.5 (H) 4.8 - 5.6 % Final    Comment:             Prediabetes: 5.7 - 6.4          Diabetes: >6.4          Glycemic control for adults with diabetes: <7.0          Passed - Valid encounter within last 6 months    Recent Outpatient Visits           2 months ago Lower abdominal pain   [place] [facility] [place], Melanie T, NP   4 months ago Routine general medical examination at a health care facility   [facility] [place], Megan P, DO   5 months ago Acute cystitis with hematuria   [place] [facility] Melvin Pao, NP

## 2024-06-21 NOTE — Telephone Encounter (Signed)
 Requested Prescriptions  Pending Prescriptions Disp Refills   lisinopril  (ZESTRIL ) 40 MG tablet [Pharmacy Med Name: LISINOPRIL  40 MG TAB] 90 tablet 0    Sig: TAKE 1 TABLET BY MOUTH ONCE DAILY     Cardiovascular:  ACE Inhibitors Failed - 06/21/2024  2:41 PM      Failed - Last BP in normal range    BP Readings from Last 1 Encounters:  05/08/24 (!) 174/84         Passed - Cr in normal range and within 180 days    Creatinine, Ser  Date Value Ref Range Status  02/06/2024 0.96 0.57 - 1.00 mg/dL Final         Passed - K in normal range and within 180 days    Potassium  Date Value Ref Range Status  02/06/2024 4.3 3.5 - 5.2 mmol/L Final         Passed - Patient is not pregnant      Passed - Valid encounter within last 6 months    Recent Outpatient Visits           2 months ago Lower abdominal pain   [place] [facility] [place], Melanie T, NP   4 months ago Routine general medical examination at a health care facility   [facility] [place], Megan P, DO   5 months ago Acute cystitis with hematuria   [place] [facility] Melvin Pao, NP

## 2024-06-22 ENCOUNTER — Other Ambulatory Visit

## 2024-07-10 DIAGNOSIS — R1310 Dysphagia, unspecified: Secondary | ICD-10-CM | POA: Diagnosis not present

## 2024-07-10 DIAGNOSIS — K219 Gastro-esophageal reflux disease without esophagitis: Secondary | ICD-10-CM | POA: Diagnosis not present

## 2024-07-10 DIAGNOSIS — Z860100 Personal history of colon polyps, unspecified: Secondary | ICD-10-CM | POA: Diagnosis not present

## 2024-07-17 ENCOUNTER — Ambulatory Visit (INDEPENDENT_AMBULATORY_CARE_PROVIDER_SITE_OTHER): Admitting: Family Medicine

## 2024-07-17 VITALS — BP 156/62 | HR 73 | Temp 98.5°F | Ht 61.0 in | Wt 207.0 lb

## 2024-07-17 DIAGNOSIS — E1165 Type 2 diabetes mellitus with hyperglycemia: Secondary | ICD-10-CM

## 2024-07-17 DIAGNOSIS — I129 Hypertensive chronic kidney disease with stage 1 through stage 4 chronic kidney disease, or unspecified chronic kidney disease: Secondary | ICD-10-CM

## 2024-07-17 DIAGNOSIS — F3342 Major depressive disorder, recurrent, in full remission: Secondary | ICD-10-CM

## 2024-07-17 DIAGNOSIS — Z794 Long term (current) use of insulin: Secondary | ICD-10-CM

## 2024-07-17 DIAGNOSIS — E7800 Pure hypercholesterolemia, unspecified: Secondary | ICD-10-CM | POA: Diagnosis not present

## 2024-07-17 MED ORDER — BUSPIRONE HCL 5 MG PO TABS: 5.0000 mg | ORAL_TABLET | Freq: Two times a day (BID) | ORAL | 0 refills | Status: DC

## 2024-07-17 MED ORDER — EMPAGLIFLOZIN 25 MG PO TABS: 25.0000 mg | ORAL_TABLET | Freq: Every day | ORAL | 0 refills | Status: DC

## 2024-07-17 MED ORDER — OMEPRAZOLE 40 MG PO CPDR: 40.0000 mg | DELAYED_RELEASE_CAPSULE | Freq: Every day | ORAL | 0 refills | Status: DC

## 2024-07-17 MED ORDER — SIMVASTATIN 40 MG PO TABS: 40.0000 mg | ORAL_TABLET | Freq: Every day | ORAL | 0 refills | Status: DC

## 2024-07-17 MED ORDER — GABAPENTIN 100 MG PO CAPS: 200.0000 mg | ORAL_CAPSULE | Freq: Two times a day (BID) | ORAL | 0 refills | Status: DC

## 2024-07-17 MED ORDER — AMLODIPINE BESYLATE 5 MG PO TABS: 5.0000 mg | ORAL_TABLET | Freq: Every day | ORAL | 0 refills | Status: DC

## 2024-07-17 MED ORDER — RYBELSUS 3 MG PO TABS: 3.0000 mg | ORAL_TABLET | Freq: Every day | ORAL | Status: DC

## 2024-07-17 MED ORDER — LISINOPRIL 30 MG PO TABS: 30.0000 mg | ORAL_TABLET | Freq: Every day | ORAL | 0 refills | Status: DC

## 2024-07-17 MED ORDER — METFORMIN HCL 1000 MG PO TABS: 1000.0000 mg | ORAL_TABLET | Freq: Two times a day (BID) | ORAL | 0 refills | Status: DC

## 2024-07-17 MED ORDER — ESCITALOPRAM OXALATE 20 MG PO TABS: 20.0000 mg | ORAL_TABLET | Freq: Every day | ORAL | 0 refills | Status: DC

## 2024-07-17 MED ORDER — HYDROCHLOROTHIAZIDE 25 MG PO TABS: 25.0000 mg | ORAL_TABLET | Freq: Every day | ORAL | 0 refills | Status: DC

## 2024-07-17 NOTE — Assessment & Plan Note (Signed)
 Under good control on current regimen. Continue current regimen. Continue to monitor. Call with any concerns. Refills given.

## 2024-07-17 NOTE — Assessment & Plan Note (Signed)
 Under good control on current regimen. Continue current regimen. Continue to monitor. Call with any concerns. Refills given. Labs drawn today.

## 2024-07-17 NOTE — Assessment & Plan Note (Signed)
 Checked last month at [facility] and not doing well with A1c of 8.6. Will get pharmacy involved. Will start sample of rybelsus . Do not start until after her colonoscopy- will recheck in about 6-8 weeks.

## 2024-07-17 NOTE — Assessment & Plan Note (Signed)
 Not under good control. Will continue her 30mg  lisinopril  and add hydrochlorothiazide . Recheck 6-8 weeks. Call with any concerns.

## 2024-07-17 NOTE — Progress Notes (Signed)
 BP (!) 156/62   Pulse 73   Temp 98.5 F (36.9 C) (Oral)   Ht 5' 1 (1.549 m)   Wt 207 lb (93.9 kg)   LMP 03/17/2013 (Approximate)   SpO2 98%   BMI 39.11 kg/m    Subjective:    Patient ID: Sierra Copeland, female    DOB: 04-18-1956, 68 y.o.   MRN: [id number]  HPI: Sierra Copeland is a 68 y.o. female  Chief Complaint  Patient presents with   Diabetes   DIABETES Hypoglycemic episodes: 69-70 Polydipsia/polyuria: no Visual disturbance: no Chest pain: no Paresthesias: yes Glucose Monitoring: no  Accucheck frequency: occasionally Taking Insulin ?: yes Blood Pressure Monitoring: not checking Retinal Examination: Not up to Date Foot Exam: Up to Date Diabetic Education: Completed Pneumovax: Up to Date Influenza: Up to Date Aspirin: yes  HYPERTENSION / HYPERLIPIDEMIA Satisfied with current treatment? no Duration of hypertension: chronic BP monitoring frequency: not checking BP medication side effects: no Past BP meds: lisinopril , amlodipine  Duration of hyperlipidemia: chronic Cholesterol medication side effects: no Cholesterol supplements: none Past cholesterol medications: simvastatin  Medication compliance: excellent compliance Aspirin: no Recent stressors: yes Recurrent headaches: no Visual changes: no Palpitations: no Dyspnea: no Chest pain: no Lower extremity edema: no Dizzy/lightheaded: no  DEPRESSION Mood status: stable Satisfied with current treatment?: yes Symptom severity: moderate  Duration of current treatment : chronic Side effects: no Medication compliance: excellent compliance Psychotherapy/counseling: no  Previous psychiatric medications: lexapro  Depressed mood: yes Anxious mood: yes Anhedonia: no Significant weight loss or gain: no Insomnia: no  Fatigue: yes Feelings of worthlessness or guilt: no Impaired concentration/indecisiveness: no Suicidal ideations: no Hopelessness: no Crying spells: no    07/17/2024   11:01 AM 04/20/2024     8:17 AM 01/20/2024   10:25 AM 11/08/2023    8:24 AM 09/07/2023    9:05 AM  Depression screen PHQ 2/9  Decreased Interest 0 0 0 0 0  Down, Depressed, Hopeless 0 0 0 0 0  PHQ - 2 Score 0 0 0 0 0  Altered sleeping 0 0 0 0 0  Tired, decreased energy 0 0 0 0 0  Change in appetite 0 0 0 0 0  Feeling bad or failure about yourself  0 0 0 0 0  Trouble concentrating 0 0 0 0 0  Moving slowly or fidgety/restless 0 0 0 0 0  Suicidal thoughts 0 0 0 0 0  PHQ-9 Score 0 0 0 0 0  Difficult doing work/chores  Not difficult at all Not difficult at all Not difficult at all Not difficult at all     Relevant past medical, surgical, family and social history reviewed and updated as indicated. Interim medical history since our last visit reviewed. Allergies and medications reviewed and updated.  Review of Systems  Constitutional: Negative.   Respiratory: Negative.    Cardiovascular: Negative.   Musculoskeletal: Negative.   Skin: Negative.   Psychiatric/Behavioral: Negative.      Per HPI unless specifically indicated above     Objective:    BP (!) 156/62   Pulse 73   Temp 98.5 F (36.9 C) (Oral)   Ht 5' 1 (1.549 m)   Wt 207 lb (93.9 kg)   LMP 03/17/2013 (Approximate)   SpO2 98%   BMI 39.11 kg/m   Wt Readings from Last 3 Encounters:  07/17/24 207 lb (93.9 kg)  05/08/24 206 lb 12.8 oz (93.8 kg)  04/20/24 206 lb 9.6 oz (93.7 kg)    Physical Exam Vitals  and nursing note reviewed.  Constitutional:      General: She is not in acute distress.    Appearance: Normal appearance. She is obese. She is not ill-appearing, toxic-appearing or diaphoretic.  HENT:     Head: Normocephalic and atraumatic.     Right Ear: External ear normal.     Left Ear: External ear normal.     Nose: Nose normal.     Mouth/Throat:     Mouth: Mucous membranes are moist.     Pharynx: Oropharynx is clear.  Eyes:     General: No scleral icterus.       Right eye: No discharge.        Left eye: No discharge.      Extraocular Movements: Extraocular movements intact.     Conjunctiva/sclera: Conjunctivae normal.     Pupils: Pupils are equal, round, and reactive to light.  Cardiovascular:     Rate and Rhythm: Normal rate and regular rhythm.     Pulses: Normal pulses.     Heart sounds: Normal heart sounds. No murmur heard.    No friction rub. No gallop.  Pulmonary:     Effort: Pulmonary effort is normal. No respiratory distress.     Breath sounds: Normal breath sounds. No stridor. No wheezing, rhonchi or rales.  Chest:     Chest wall: No tenderness.  Musculoskeletal:        General: Normal range of motion.     Cervical back: Normal range of motion and neck supple.  Skin:    General: Skin is warm and dry.     Capillary Refill: Capillary refill takes less than 2 seconds.     Coloration: Skin is not jaundiced or pale.     Findings: No bruising, erythema, lesion or rash.  Neurological:     General: No focal deficit present.     Mental Status: She is alert and oriented to person, place, and time. Mental status is at baseline.  Psychiatric:        Mood and Affect: Mood normal.        Behavior: Behavior normal.        Thought Content: Thought content normal.        Judgment: Judgment normal.     Results for orders placed or performed in visit on 04/20/24  Microscopic Examination   Collection Time: 04/20/24  8:18 AM   Urine  Result Value Ref Range   WBC, UA 6-10 (A) 0 - 5 /hpf   RBC, Urine 0-2 0 - 2 /hpf   Epithelial Cells (non renal) 0-10 0 - 10 /hpf   Bacteria, UA None seen None seen/Few  Urinalysis, Routine w reflex microscopic   Collection Time: 04/20/24  8:18 AM  Result Value Ref Range   Specific Gravity, UA 1.020 1.005 - 1.030   pH, UA 5.5 5.0 - 7.5   Color, UA Yellow Yellow   Appearance Ur Cloudy (A) Clear   Leukocytes,UA Trace (A) Negative   Protein,UA Negative Negative/Trace   Glucose, UA 2+ (A) Negative   Ketones, UA Trace (A) Negative   RBC, UA Trace (A) Negative    Bilirubin, UA Negative Negative   Urobilinogen, Ur 0.2 0.2 - 1.0 mg/dL   Nitrite, UA Negative Negative   Microscopic Examination See below:   WET PREP FOR TRICH, YEAST, CLUE   Collection Time: 04/20/24  8:52 AM   Specimen: Sterile Swab   Sterile Swab  Result Value Ref Range   Trichomonas Exam Negative Negative  Yeast Exam Negative Negative   Clue Cell Exam Negative Negative  Microalbumin, Urine Waived   Collection Time: 04/20/24  8:52 AM  Result Value Ref Range   Microalb, Ur Waived 80 (H) 0 - 19 mg/L   Creatinine, Urine Waived 100 10 - 300 mg/dL   Microalb/Creat Ratio 30-300 (H) <30 mg/g  Urine Culture   Collection Time: 04/20/24  8:59 AM   Specimen: Urine   UR  Result Value Ref Range   Urine Culture, Routine Final report    Organism ID, Bacteria Comment       Assessment & Plan:   Problem List Items Addressed This Visit       Endocrine   Type 2 diabetes mellitus with hyperglycemia (HCC) - Primary   Checked last month at [facility] and not doing well with A1c of 8.6. Will get pharmacy involved. Will start sample of rybelsus . Do not start until after her colonoscopy- will recheck in about 6-8 weeks.       Relevant Medications   empagliflozin  (JARDIANCE ) 25 MG TABS tablet   metFORMIN  (GLUCOPHAGE ) 1000 MG tablet   simvastatin  (ZOCOR ) 40 MG tablet   lisinopril  (ZESTRIL ) 30 MG tablet   Semaglutide  (RYBELSUS ) 3 MG TABS   Semaglutide  (RYBELSUS ) 3 MG TABS   Other Relevant Orders   CBC with Differential/Platelet   Comprehensive metabolic panel with GFR   Lipid Panel w/o Chol/HDL Ratio     Genitourinary   Benign hypertensive renal disease   Not under good control. Will continue her 30mg  lisinopril  and add hydrochlorothiazide . Recheck 6-8 weeks. Call with any concerns.         Other   Hypercholesteremia   Under good control on current regimen. Continue current regimen. Continue to monitor. Call with any concerns. Refills given. Labs drawn today.        Relevant Medications    amLODipine  (NORVASC ) 5 MG tablet   simvastatin  (ZOCOR ) 40 MG tablet   lisinopril  (ZESTRIL ) 30 MG tablet   hydrochlorothiazide  (HYDRODIURIL ) 25 MG tablet   Recurrent major depressive disorder, in full remission (HCC)   Under good control on current regimen. Continue current regimen. Continue to monitor. Call with any concerns. Refills given.        Relevant Medications   escitalopram  (LEXAPRO ) 20 MG tablet   busPIRone  (BUSPAR ) 5 MG tablet     Follow up plan: Return 6-8 weeks.

## 2024-07-18 LAB — COMPREHENSIVE METABOLIC PANEL WITH GFR
ALT: 15 IU/L (ref 0–32)
AST: 17 IU/L (ref 0–40)
Albumin: 4.1 g/dL (ref 3.9–4.9)
Alkaline Phosphatase: 36 IU/L — ABNORMAL LOW (ref 44–121)
BUN/Creatinine Ratio: 20 (ref 12–28)
BUN: 22 mg/dL (ref 8–27)
Bilirubin Total: 0.3 mg/dL (ref 0.0–1.2)
CO2: 21 mmol/L (ref 20–29)
Calcium: 9.6 mg/dL (ref 8.7–10.3)
Chloride: 104 mmol/L (ref 96–106)
Creatinine, Ser: 1.11 mg/dL — ABNORMAL HIGH (ref 0.57–1.00)
Globulin, Total: 2.2 g/dL (ref 1.5–4.5)
Glucose: 120 mg/dL — ABNORMAL HIGH (ref 70–99)
Potassium: 4.6 mmol/L (ref 3.5–5.2)
Sodium: 142 mmol/L (ref 134–144)
Total Protein: 6.3 g/dL (ref 6.0–8.5)
eGFR: 55 mL/min/1.73 — ABNORMAL LOW (ref >59)

## 2024-07-18 LAB — CBC WITH DIFFERENTIAL/PLATELET
Basophils Absolute: 0.1 x10E3/uL (ref 0.0–0.2)
Basos: 1 %
EOS (ABSOLUTE): 0.2 x10E3/uL (ref 0.0–0.4)
Eos: 4 %
Hematocrit: 42.2 % (ref 34.0–46.6)
Hemoglobin: 12.5 g/dL (ref 11.1–15.9)
Immature Grans (Abs): 0 x10E3/uL (ref 0.0–0.1)
Immature Granulocytes: 0 %
Lymphocytes Absolute: 1.7 x10E3/uL (ref 0.7–3.1)
Lymphs: 32 %
MCH: 24 pg — ABNORMAL LOW (ref 26.6–33.0)
MCHC: 29.6 g/dL — ABNORMAL LOW (ref 31.5–35.7)
MCV: 81 fL (ref 79–97)
Monocytes Absolute: 0.5 x10E3/uL (ref 0.1–0.9)
Monocytes: 9 %
Neutrophils Absolute: 3 x10E3/uL (ref 1.4–7.0)
Neutrophils: 54 %
Platelets: 272 x10E3/uL (ref 150–450)
RBC: 5.2 x10E6/uL (ref 3.77–5.28)
RDW: 16.1 % — ABNORMAL HIGH (ref 11.7–15.4)
WBC: 5.5 x10E3/uL (ref 3.4–10.8)

## 2024-07-18 LAB — LIPID PANEL W/O CHOL/HDL RATIO
Cholesterol, Total: 144 mg/dL (ref 100–199)
HDL: 54 mg/dL (ref >39)
LDL Chol Calc (NIH): 67 mg/dL (ref 0–99)
Triglycerides: 132 mg/dL (ref 0–149)
VLDL Cholesterol Cal: 23 mg/dL (ref 5–40)

## 2024-07-23 ENCOUNTER — Other Ambulatory Visit: Payer: Self-pay | Admitting: Family Medicine

## 2024-07-23 ENCOUNTER — Other Ambulatory Visit: Payer: Self-pay

## 2024-07-23 MED ORDER — NYSTATIN 100000 UNIT/GM EX POWD: 1.0000 | Freq: Three times a day (TID) | CUTANEOUS | 3 refills | Status: AC

## 2024-07-23 MED ORDER — NYSTATIN 100000 UNIT/GM EX CREA: 1.0000 | TOPICAL_CREAM | Freq: Two times a day (BID) | CUTANEOUS | 3 refills | Status: AC

## 2024-07-23 NOTE — Progress Notes (Addendum)
   07/23/2024  Patient ID: Sierra Copeland, female   DOB: 09-Apr-1956, 68 y.o.   MRN: [id number]  Subjective/Objective Telephone visit to follow-up on management of hypertension and diabetes  Diabetes -Current medications:  metformin  1000mg  BID, Humalog  10-20 units TID with meals, Toujeo  300unit/mL 60 units daily, Jardiance  25mg  daily -Using [place] 3 for CGM and linked to [facility] Dashboard, but data showing is from 2024 -A1c of 8.6% 8/19 -Provided with Rybelsus  3mg  samples to start after upcoming colonoscopy 9/4 -Patient endorses external irritation in the vaginal area likely due to increased urination with elevated Bgs and Jardiance  use.  Would like topical cream to help resolve- states feels raw with some itching and soreness; no pain or burning with urination noted -Using simvastatin  40mg  at bedtime for ASCVD risk reduction; LDL 67, TG 132 on 8/19  Hypertension -Current medications:  amlodipine  5mg  daily, lisinopril  30mg  daily  -Patient states she does now have home BP monitor but has not checked readings recently -BP of 156/62 at recent PCP visit, so hydrochlorothiazide  25mg  was added to medication regimen; patient has picked this up but has not started taking yet  Assessment/Plan  Diabetes -Uncontrolled with A1c >7% -Start Rybelsus  3mg  daily after colonoscopy on 9/4 (has 30d of samples) -I will work with PA team to verify insurance coverage and check copay of Rybelsus  and will inform patient; can see if she qualifies for Novo PAP if needed -Continue Toujeo  60 units at bedtime, Humalog  10-20 units TID, metformin  1000mg  BID, Jardiance  25mg  dialy -Coordinating with PCP to send topical cream in for suspected yeast infection -Continue [place] 3+ for CGM -Continue simvastatin  40mg  for ASCVD risk reduction  Hypertension -Uncontrolled -Initiate hydrochlorothiazide  25mg  daily (patient already has at home), continue lisinopril  30mg  daily, continue amlodipine  5mg  daily  -Recommend  checking/recording home BP at least 2-3x/week  Follow-up: 1 month  [name], PharmD, DPLA

## 2024-07-24 ENCOUNTER — Other Ambulatory Visit (HOSPITAL_COMMUNITY): Payer: Self-pay

## 2024-07-24 ENCOUNTER — Telehealth: Payer: Self-pay

## 2024-07-24 NOTE — Telephone Encounter (Signed)
 Pharmacy Patient Advocate Encounter  Insurance verification completed.    The patient is insured through [facility]. Patient has Medicare and is not eligible for a copay card, but may be able to apply for patient assistance or Medicare RX Payment Plan (Patient Must reach out to their plan, if eligible for payment plan), if available.    Ran test claim for Rybelsus  and the current 30 day co-pay is $0.00.   This test claim was processed through [facility]- copay amounts may vary at other pharmacies due to pharmacy/plan contracts, or as the patient moves through the different stages of their insurance plan.

## 2024-07-26 ENCOUNTER — Ambulatory Visit: Payer: Self-pay | Admitting: Family Medicine

## 2024-08-02 ENCOUNTER — Ambulatory Visit

## 2024-08-02 ENCOUNTER — Ambulatory Visit
Admission: RE | Admit: 2024-08-02 | Discharge: 2024-08-02 | Disposition: A | Discharge status: Home / Self Care | Attending: Gastroenterology | Admitting: Gastroenterology

## 2024-08-02 ENCOUNTER — Other Ambulatory Visit: Payer: Self-pay | Admitting: Gastroenterology

## 2024-08-02 ENCOUNTER — Encounter
Admission: RE | Disposition: A | Discharge status: Home / Self Care | Payer: Self-pay | Source: Home / Self Care | Attending: Gastroenterology

## 2024-08-02 ENCOUNTER — Encounter: Payer: Self-pay | Admitting: Gastroenterology

## 2024-08-02 DIAGNOSIS — K222 Esophageal obstruction: Secondary | ICD-10-CM | POA: Diagnosis not present

## 2024-08-02 DIAGNOSIS — K5730 Diverticulosis of large intestine without perforation or abscess without bleeding: Secondary | ICD-10-CM | POA: Diagnosis not present

## 2024-08-02 DIAGNOSIS — K219 Gastro-esophageal reflux disease without esophagitis: Secondary | ICD-10-CM | POA: Insufficient documentation

## 2024-08-02 DIAGNOSIS — M1990 Unspecified osteoarthritis, unspecified site: Secondary | ICD-10-CM | POA: Diagnosis not present

## 2024-08-02 DIAGNOSIS — K640 First degree hemorrhoids: Secondary | ICD-10-CM | POA: Diagnosis not present

## 2024-08-02 DIAGNOSIS — K2970 Gastritis, unspecified, without bleeding: Secondary | ICD-10-CM | POA: Insufficient documentation

## 2024-08-02 DIAGNOSIS — Z09 Encounter for follow-up examination after completed treatment for conditions other than malignant neoplasm: Secondary | ICD-10-CM | POA: Diagnosis not present

## 2024-08-02 DIAGNOSIS — D122 Benign neoplasm of ascending colon: Secondary | ICD-10-CM | POA: Diagnosis not present

## 2024-08-02 DIAGNOSIS — Z87891 Personal history of nicotine dependence: Secondary | ICD-10-CM | POA: Diagnosis not present

## 2024-08-02 DIAGNOSIS — K224 Dyskinesia of esophagus: Secondary | ICD-10-CM | POA: Insufficient documentation

## 2024-08-02 DIAGNOSIS — I10 Essential (primary) hypertension: Secondary | ICD-10-CM | POA: Insufficient documentation

## 2024-08-02 DIAGNOSIS — Z860101 Personal history of adenomatous and serrated colon polyps: Secondary | ICD-10-CM | POA: Diagnosis not present

## 2024-08-02 DIAGNOSIS — M069 Rheumatoid arthritis, unspecified: Secondary | ICD-10-CM | POA: Insufficient documentation

## 2024-08-02 DIAGNOSIS — F32A Depression, unspecified: Secondary | ICD-10-CM | POA: Insufficient documentation

## 2024-08-02 DIAGNOSIS — K208 Other esophagitis without bleeding: Secondary | ICD-10-CM | POA: Diagnosis not present

## 2024-08-02 DIAGNOSIS — Z79891 Long term (current) use of opiate analgesic: Secondary | ICD-10-CM | POA: Diagnosis not present

## 2024-08-02 DIAGNOSIS — F419 Anxiety disorder, unspecified: Secondary | ICD-10-CM | POA: Diagnosis not present

## 2024-08-02 DIAGNOSIS — K2960 Other gastritis without bleeding: Secondary | ICD-10-CM | POA: Diagnosis not present

## 2024-08-02 DIAGNOSIS — Z1509 Genetic susceptibility to other malignant neoplasm: Secondary | ICD-10-CM | POA: Insufficient documentation

## 2024-08-02 DIAGNOSIS — D124 Benign neoplasm of descending colon: Secondary | ICD-10-CM | POA: Diagnosis not present

## 2024-08-02 DIAGNOSIS — G4730 Sleep apnea, unspecified: Secondary | ICD-10-CM | POA: Diagnosis not present

## 2024-08-02 DIAGNOSIS — K649 Unspecified hemorrhoids: Secondary | ICD-10-CM | POA: Diagnosis not present

## 2024-08-02 DIAGNOSIS — K5790 Diverticulosis of intestine, part unspecified, without perforation or abscess without bleeding: Secondary | ICD-10-CM | POA: Diagnosis not present

## 2024-08-02 DIAGNOSIS — K635 Polyp of colon: Secondary | ICD-10-CM | POA: Diagnosis not present

## 2024-08-02 HISTORY — PX: ESOPHAGEAL DILATION: SHX303

## 2024-08-02 HISTORY — PX: ESOPHAGOGASTRODUODENOSCOPY: SHX5428

## 2024-08-02 HISTORY — PX: COLONOSCOPY: SHX5424

## 2024-08-02 HISTORY — PX: POLYPECTOMY: SHX149

## 2024-08-02 LAB — GLUCOSE, CAPILLARY: Glucose-Capillary: 202 mg/dL — ABNORMAL HIGH (ref 70–99)

## 2024-08-02 SURGERY — COLONOSCOPY
Anesthesia: General

## 2024-08-02 MED ORDER — PROPOFOL 500 MG/50ML IV EMUL
INTRAVENOUS | Status: DC | PRN
  Administered 2024-08-02: 125 ug/kg/min via INTRAVENOUS

## 2024-08-02 MED ORDER — PROPOFOL 10 MG/ML IV BOLUS
INTRAVENOUS | Status: AC
  Filled 2024-08-02: qty 20

## 2024-08-02 MED ORDER — PROPOFOL 1000 MG/100ML IV EMUL
INTRAVENOUS | Status: AC
  Filled 2024-08-02: qty 100

## 2024-08-02 MED ORDER — LIDOCAINE HCL (PF) 2 % IJ SOLN
INTRAMUSCULAR | Status: AC
  Filled 2024-08-02: qty 5

## 2024-08-02 MED ORDER — PROPOFOL 10 MG/ML IV BOLUS
INTRAVENOUS | Status: DC | PRN
  Administered 2024-08-02: 30 mg via INTRAVENOUS
  Administered 2024-08-02: 50 mg via INTRAVENOUS

## 2024-08-02 MED ORDER — SODIUM CHLORIDE 0.9 % IV SOLN: INTRAVENOUS | Status: DC

## 2024-08-02 MED ORDER — LIDOCAINE HCL (PF) 2 % IJ SOLN
INTRAMUSCULAR | Status: DC | PRN
  Administered 2024-08-02: 60 mg via INTRADERMAL

## 2024-08-02 NOTE — Op Note (Signed)
 [facility] Gastroenterology Patient Name: Sierra Copeland Procedure Date: 08/02/2024 7:22 AM MRN: [id number] Account #: [id number] Date of Birth: [birth year] Admit Type: Outpatient Age: 68 Room: [facility] ENDO ROOM 1 Gender: Female Note Status: Finalized Instrument Name: Colon Scope [id number] Procedure:             Colonoscopy Indications:           Personal history of colon polyps, Lynch syndrome Providers:             Elspeth Ozell Onita ROSALEA, DO Referring MD:          Duwaine MYRTIS Louder (Referring MD) Medicines:             Monitored Anesthesia Care Complications:         No immediate complications. Estimated blood loss:                         Minimal. Procedure:             Pre-Anesthesia Assessment:                        - Prior to the procedure, a History and Physical was                         performed, and patient medications and allergies were                         reviewed. The patient is competent. The risks and                         benefits of the procedure and the sedation options and                         risks were discussed with the patient. All questions                         were answered and informed consent was obtained.                         Patient identification and proposed procedure were                         verified by the physician, the nurse, the anesthetist                         and the technician in the endoscopy suite. Mental                         Status Examination: alert and oriented. Airway                         Examination: normal oropharyngeal airway and neck                         mobility. Respiratory Examination: clear to                         auscultation. CV Examination: RRR, no murmurs, no S3  or S4. Prophylactic Antibiotics: The patient does not                         require prophylactic antibiotics. Prior                         Anticoagulants: The patient has taken no anticoagulant                          or antiplatelet agents. ASA Grade Assessment: III - A                         patient with severe systemic disease. After reviewing                         the risks and benefits, the patient was deemed in                         satisfactory condition to undergo the procedure. The                         anesthesia plan was to use monitored anesthesia care                         (MAC). Immediately prior to administration of                         medications, the patient was re-assessed for adequacy                         to receive sedatives. The heart rate, respiratory                         rate, oxygen saturations, blood pressure, adequacy of                         pulmonary ventilation, and response to care were                         monitored throughout the procedure. The physical                         status of the patient was re-assessed after the                         procedure.                        After obtaining informed consent, the colonoscope was                         passed under direct vision. Throughout the procedure,                         the patient's blood pressure, pulse, and oxygen                         saturations were monitored continuously. The  Colonoscope was introduced through the anus and                         advanced to the the terminal ileum, with                         identification of the appendiceal orifice and IC                         valve. The colonoscopy was performed without                         difficulty. The patient tolerated the procedure well.                         The quality of the bowel preparation was evaluated                         using the BBPS [facility] Bowel Preparation Scale) with                         scores of: Right Colon = 2 (minor amount of residual                         staining, small fragments of stool and/or opaque                          liquid, but mucosa seen well), Transverse Colon = 2                         (minor amount of residual staining, small fragments of                         stool and/or opaque liquid, but mucosa seen well) and                         Left Colon = 2 (minor amount of residual staining,                         small fragments of stool and/or opaque liquid, but                         mucosa seen well). The total BBPS score equals 6. The                         quality of the bowel preparation was good. The                         terminal ileum, ileocecal valve, appendiceal orifice,                         and rectum were photographed. Findings:      The perianal and digital rectal examinations were normal. Pertinent       negatives include normal sphincter tone.      The terminal ileum appeared normal. Estimated blood loss: none.      Retroflexion in the right colon was performed.  Multiple small-mouthed diverticula were found in the left colon.       Estimated blood loss: none.      Non-bleeding internal hemorrhoids were found during retroflexion. The       hemorrhoids were Grade I (internal hemorrhoids that do not prolapse).       Estimated blood loss: none.      Three sessile polyps were found in the descending colon and ascending       colon (2). The polyps were 2 to 3 mm in size. These polyps were removed       with a cold snare. Resection and retrieval were complete. Estimated       blood loss was minimal.      The exam was otherwise without abnormality on direct and retroflexion       views. Impression:            - The examined portion of the ileum was normal.                        - Diverticulosis in the left colon.                        - Non-bleeding internal hemorrhoids.                        - Three 2 to 3 mm polyps in the descending colon and                         in the ascending colon, removed with a cold snare.                         Resected and retrieved.                         - The examination was otherwise normal on direct and                         retroflexion views. Recommendation:        - Patient has a contact number available for                         emergencies. The signs and symptoms of potential                         delayed complications were discussed with the patient.                         Return to normal activities tomorrow. Written                         discharge instructions were provided to the patient.                        - Discharge patient to home.                        - Resume previous diet.                        - Continue present medications.                        -  Await pathology results.                        - No ibuprofen, naproxen, or other non-steroidal                         anti-inflammatory drugs for 5 days after polyp removal.                        - Repeat colonoscopy in 2 years for surveillance based                         on pathology results.                        - Return to referring physician as previously                         scheduled.                        - The findings and recommendations were discussed with                         the patient. Procedure Code(s):     --- Professional ---                        [phone], Colonoscopy, flexible; with removal of                         tumor(s), polyp(s), or other lesion(s) by snare                         technique Diagnosis Code(s):     --- Professional ---                        K64.0, First degree hemorrhoids                        D12.4, Benign neoplasm of descending colon                        D12.2, Benign neoplasm of ascending colon                        K57.30, Diverticulosis of large intestine without                         perforation or abscess without bleeding CPT copyright 2022 American Medical Association. All rights reserved. The codes documented in this report are preliminary and upon coder review may  be  revised to meet current compliance requirements. Attending Participation:      I personally performed the entire procedure. Elspeth Jungling, DO Elspeth Ozell Jungling DO, DO 08/02/2024 8:40:00 AM This report has been signed electronically. Number of Addenda: 0 Note Initiated On: 08/02/2024 7:22 AM Scope Withdrawal Time: 0 hours 12 minutes 47 seconds  Total Procedure Duration: 0 hours 17 minutes 44 seconds  Estimated Blood Loss:  Estimated blood loss was minimal.      [facility]

## 2024-08-02 NOTE — Anesthesia Preprocedure Evaluation (Addendum)
 Anesthesia Evaluation  Patient identified by MRN, date of birth, ID band Patient awake    Reviewed: Allergy & Precautions, H&P , NPO status , Patient's Chart, lab work & pertinent test results  History of Anesthesia Complications (+) PONV and history of anesthetic complications  Airway Mallampati: II  TM Distance: >3 FB Neck ROM: full    Dental no notable dental hx.    Pulmonary sleep apnea , former smoker   Pulmonary exam normal        Cardiovascular hypertension, Normal cardiovascular exam     Neuro/Psych  Headaches PSYCHIATRIC DISORDERS Anxiety Depression       GI/Hepatic Neg liver ROS,GERD  ,,  Endo/Other  diabetes, Type 2    Renal/GU negative Renal ROS  negative genitourinary   Musculoskeletal  (+) Arthritis , Rheumatoid disorders,    Abdominal  (+) + obese  Peds  Hematology negative hematology ROS (+)   Anesthesia Other Findings Past Medical History: No date: Abnormal mammogram No date: Anxiety No date: Arthritis No date: Back pain     Comment:  spinal stenosis and neck bone spur No date: Cancer (HCC)     Comment:  Breast No date: Car sickness No date: Collagenous colitis No date: Depression No date: Diabetes mellitus without complication (HCC) No date: GERD (gastroesophageal reflux disease)     Comment:  occ-no meds No date: Hair loss     Comment:  TAKING THYROTAIN No date: Headache     Comment:  migraines No date: Hyperlipemia No date: Hypertension No date: Osteoarthritis     Comment:  seen by Rheum, not enoug evidence to support RA No date: Pinched nerve No date: PONV (postoperative nausea and vomiting)     Comment:  after tonsillectomy No date: Rosacea No date: Seborrheic dermatitis No date: Sleep apnea No date: 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 Tumor is               triple negative. She is dated radiation in August 2018.                Continues to have problems with lymphedema No date: Wears glasses  Past Surgical History: 2007: ACHILLES TENDON REPAIR     Comment:  right 2018: BREAST LUMPECTOMY     Comment:  BREAST LUMPECTOMY Left 03/2017 2011: BREAST SURGERY     Comment:  lt br bx-non cancer 1989: CARPAL TUNNEL RELEASE     Comment:  right and left 09/06/2017: CHOLECYSTECTOMY; N/A     Comment:  Gallbladder not removed due to complications. Procedure:              LAPAROSCOPY;  Surgeon: Claudene Larinda Bolder, MD;                Location: [facility];  Service: General;  Laterality: N/A; No date: COLONOSCOPY 09/24/2021: COLONOSCOPY WITH PROPOFOL ; N/A     Comment:  Procedure: COLONOSCOPY WITH PROPOFOL ;  Surgeon: Onita Elspeth Sharper, DO;  Location: [facility] ENDOSCOPY;  Service:               Gastroenterology;  Laterality: N/A;  IDDM 2017: epidural steroid injection x2 01/21/2016: ESOPHAGOGASTRODUODENOSCOPY; N/A     Comment:  Procedure: ESOPHAGOGASTRODUODENOSCOPY (EGD);  Surgeon:               Deward CINDERELLA Piedmont, MD;  Location: [facility];  Service:               Gastroenterology;  Laterality: N/A;  Please call at work               [phone] Diabetic - insulin  09/24/2021: ESOPHAGOGASTRODUODENOSCOPY; N/A     Comment:  Procedure: ESOPHAGOGASTRODUODENOSCOPY (EGD);  Surgeon:               Onita Elspeth Sharper, DO;  Location: [facility] END[facility]COPY;                Service: Gastroenterology;  Laterality: N/A; 2013: FINGER EXPLORATION     Comment:  left long  No date: Lynch's Syndrome 2018: MASTECTOMY; Left     Comment:  Partial 10/03/2013: NERVE EXPLORATION; Left     Comment:  Procedure: NERVE EXPLORATION/ LEFT LONG RADIAL DISTAL               NERVE neurolysis;  Surgeon: Donnice DELENA Robinsons, MD;                Location: [facility];  Service:               Orthopedics;  Laterality: Left; 05/13/2016: SHOULDER ARTHROSCOPY WITH DEBRIDEMENT AND BICEP TENDON  REPAIR; Right     Comment:   Procedure: Arthroscopic debridement, open decompression,              rotator cuff repair, tenodesis,right shoulder.;  Surgeon:              Norleen JINNY Maltos, MD;  Location: [facility] ORS;  Service:               Orthopedics;  Laterality: Right; 2000: TONSILLECTOMY 1998: UVULOPALATOPHARYNGOPLASTY     Comment:  and tonsils-tongue pexi No date: . BREAST BIOPSY Left 03/14/2017      Reproductive/Obstetrics negative OB ROS                              Anesthesia Physical Anesthesia Plan  ASA: 3  Anesthesia Plan: General   Post-op Pain Management:    Induction: Intravenous  PONV Risk Score and Plan: Propofol  infusion and TIVA  Airway Management Planned:   Additional Equipment:   Intra-op Plan:   Post-operative Plan:   Informed Consent: I have reviewed the patients History and Physical, chart, labs and discussed the procedure including the risks, benefits and alternatives for the proposed anesthesia with the patient or authorized representative who has indicated his/her understanding and acceptance.     Dental Advisory Given  Plan Discussed with: CRNA and Surgeon  Anesthesia Plan Comments:          Anesthesia Quick Evaluation

## 2024-08-02 NOTE — Transfer of Care (Signed)
 Immediate Anesthesia Transfer of Care Note  Patient: Sierra Copeland  Procedure(s) Performed: COLONOSCOPY EGD (ESOPHAGOGASTRODUODENOSCOPY) DILATION, ESOPHAGUS POLYPECTOMY, INTESTINE  Patient Location: Endoscopy Unit  Anesthesia Type:General  Level of Consciousness: drowsy and patient cooperative  Airway & Oxygen Therapy: Patient Spontanous Breathing and Patient connected to face mask oxygen  Post-op Assessment: Report given to RN, Post -op Vital signs reviewed and stable, and Patient moving all extremities X 4  Post vital signs: Reviewed and stable  Last Vitals:  Vitals Value Taken Time  BP    Temp    Pulse 73 08/02/24 08:38  Resp 12 08/02/24 08:38  SpO2 98 % 08/02/24 08:38  Vitals shown include unfiled device data.  Last Pain:  Vitals:   08/02/24 0738  TempSrc: Temporal  PainSc: 5          Complications: No notable events documented.

## 2024-08-02 NOTE — Anesthesia Postprocedure Evaluation (Signed)
 Anesthesia Post Note  Patient: Sierra Copeland  Procedure(s) Performed: COLONOSCOPY EGD (ESOPHAGOGASTRODUODENOSCOPY) DILATION, ESOPHAGUS POLYPECTOMY, INTESTINE  Patient location during evaluation: Endoscopy Anesthesia Type: General Level of consciousness: awake and alert Pain management: pain level controlled Vital Signs Assessment: post-procedure vital signs reviewed and stable Respiratory status: spontaneous breathing, nonlabored ventilation and respiratory function stable Cardiovascular status: blood pressure returned to baseline and stable Postop Assessment: no apparent nausea or vomiting Anesthetic complications: no   No notable events documented.   Last Vitals:  Vitals:   08/02/24 0848 08/02/24 0858  BP: (!) 148/76 (!) 167/79  Pulse: 81 72  Resp: 19 17  Temp:    SpO2: 97% 98%    Last Pain:  Vitals:   08/02/24 0858  TempSrc:   PainSc: 0-No pain                 Camellia Merilee Louder

## 2024-08-02 NOTE — H&P (Signed)
 Pre-Procedure H&P   Patient ID: Sierra Copeland is a 68 y.o. female.  Gastroenterology Provider: Elspeth Ozell Jungling, DO  Referring Provider: Romero Antigua, PA PCP: Vicci Duwaine SQUIBB, DO  Date: 08/02/2024  HPI [name] is a 68 y.o. female who presents today for Esophagogastroduodenoscopy and Colonoscopy for Dysphagia, GERD, Lynch syndrome, personal history of colon polyps.  She was diagnosed with Lynch syndrome via genetics  Patient has BM most days.  Occasional diarrhea which is improved from previous.  No melena or hematochezia  Patient does experience dysphagia which she defines as spasm with solids liquids and pills.  She denies any GERD symptoms on PPI.  Her dysphagia improved with dilation in 2022  Last underwent EGD and colonoscopy in October 2022.  EGD demonstrated abnormal esophageal motility.  Biopsies negative for EOE.  Gastritis was present but she was previously H. pylori negative.  Dilated with 29 Maloney with disruption She had multiple adenomatous polyps removed with recommended 3-year repeat.  Normal terminal ileum, left-sided diverticulosis, internal hemorrhoids.  Biopsies negative for microscopic colitis   Past Medical History:  Diagnosis Date   Abnormal mammogram    Anxiety    Arthritis    Back pain    spinal stenosis and neck bone spur   Cancer (HCC)    Breast   Car sickness    Collagenous colitis    Depression    Diabetes mellitus without complication (HCC)    GERD (gastroesophageal reflux disease)    occ-no meds   Hair loss    TAKING THYROTAIN   Headache    migraines   Hyperlipemia    Hypertension    Osteoarthritis    seen by Rheum, not enoug evidence to support RA   Pinched nerve    PONV (postoperative nausea and vomiting)    after tonsillectomy   Rosacea    Seborrheic dermatitis    Sleep apnea    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 Tumor is triple negative. She is dated radiation in  August 2018. Continues to have problems with lymphedema   Wears glasses     Past Surgical History:  Procedure Laterality Date   ACHILLES TENDON REPAIR  2007   right   BREAST LUMPECTOMY  2018   BREAST LUMPECTOMY Left 03/2017   BREAST SURGERY  2011   lt br bx-non cancer   CARPAL TUNNEL RELEASE  1989   right and left   CHOLECYSTECTOMY N/A 09/06/2017   Gallbladder not removed due to complications. Procedure: LAPAROSCOPY;  Surgeon: Claudene Larinda Bolder, MD;  Location: [facility];  Service: General;  Laterality: N/A;   COLONOSCOPY     COLONOSCOPY WITH PROPOFOL  N/A 09/24/2021   Procedure: COLONOSCOPY WITH PROPOFOL ;  Surgeon: Jungling Elspeth Ozell, DO;  Location: [facility] ENDOSCOPY;  Service: Gastroenterology;  Laterality: N/A;  IDDM   epidural steroid injection x2  2017   ESOPHAGOGASTRODUODENOSCOPY N/A 01/21/2016   Procedure: ESOPHAGOGASTRODUODENOSCOPY (EGD);  Surgeon: Deward CINDERELLA Piedmont, MD;  Location: [facility];  Service: Gastroenterology;  Laterality: N/A;  Please call at work [phone] Diabetic - insulin    ESOPHAGOGASTRODUODENOSCOPY N/A 09/24/2021   Procedure: ESOPHAGOGASTRODUODENOSCOPY (EGD);  Surgeon: Jungling Elspeth Ozell, DO;  Location: [facility] ENDOSCOPY;  Service: Gastroenterology;  Laterality: N/A;   FINGER EXPLORATION  2013   left long    Lynch's Syndrome     MASTECTOMY Left 2018   Partial   NERVE EXPLORATION Left 10/03/2013   Procedure: NERVE EXPLORATION/  LEFT LONG RADIAL DISTAL NERVE neurolysis;  Surgeon: Donnice DELENA Robinsons, MD;  Location: [facility];  Service: Orthopedics;  Laterality: Left;   SHOULDER ARTHROSCOPY WITH DEBRIDEMENT AND BICEP TENDON REPAIR Right 05/13/2016   Procedure: Arthroscopic debridement, open decompression, rotator cuff repair, tenodesis,right shoulder.;  Surgeon: Norleen JINNY Maltos, MD;  Location: [facility];  Service: Orthopedics;  Laterality: Right;   TONSILLECTOMY  2000   UVULOPALATOPHARYNGOPLASTY  1998   and tonsils-tongue pexi    BREAST BIOPSY  Left 03/14/2017       Family History Mother- gallbladder cancer No other h/o GI disease or malignancy  Review of Systems  Constitutional:  Negative for activity change, appetite change, chills, diaphoresis, fatigue, fever and unexpected weight change.  HENT:  Positive for trouble swallowing. Negative for voice change.   Respiratory:  Negative for shortness of breath and wheezing.   Cardiovascular:  Negative for chest pain, palpitations and leg swelling.  Gastrointestinal:  Negative for abdominal distention, abdominal pain, anal bleeding, blood in stool, constipation, diarrhea, nausea, rectal pain and vomiting.  Musculoskeletal:  Negative for arthralgias and myalgias.  Skin:  Negative for color change and pallor.  Neurological:  Negative for dizziness, syncope and weakness.  Psychiatric/Behavioral:  Negative for confusion.   All other systems reviewed and are negative.    Medications No current facility-administered medications on file prior to encounter.   Current Outpatient Medications on File Prior to Encounter  Medication Sig Dispense Refill   acetaminophen  (TYLENOL ) 650 MG CR tablet Take 650 mg by mouth every 8 (eight) hours as needed for pain.     aspirin-acetaminophen -caffeine (EXCEDRIN MIGRAINE) 250-250-65 MG tablet Take 1 tablet by mouth every 6 (six) hours as needed for headache.     Cholecalciferol (VITAMIN D3) 2000 units capsule Take 2,000 Units by mouth at bedtime.      Coenzyme Q10 (COQ-10) 100 MG CAPS Take 100 mg by mouth daily.     Continuous Glucose Receiver (FREESTYLE LIBRE 3 READER) DEVI 1 kit by Does not apply route daily. 1 each 0   Continuous Glucose Sensor (FREESTYLE LIBRE 3 SENSOR) MISC USE 1 SENSOR ON THE SKIN EVERY 14 DAYS AS DIRECTED 104 each 0   fexofenadine (ALLEGRA) 180 MG tablet Take 180 mg by mouth daily with breakfast.      fluticasone (FLONASE) 50 MCG/ACT nasal spray Place 1-2 sprays into both nostrils every evening.      Glucagon  (GVOKE HYPOPEN   2-PACK) 1 MG/0.2ML SOAJ Inject 1 each into the skin as needed. 0.2 mL 12   Hyoscyamine  Sulfate SL 0.125 MG SUBL Place 0.125 mg under the tongue daily as needed (cramping). 120 tablet 3   insulin  lispro (HUMALOG  KWIKPEN) 100 UNIT/ML KwikPen INJECT 10-20 UNITS SUBCUTANEOUSLY WITH BREAKFAST  WITH LUNCH AND DINNER 54 mL 1   Insulin  Syringe-Needle U-100 30G X 5/16 0.3 ML MISC Inject 1 each into the skin 3 (three) times daily. 500 each 12   leflunomide (ARAVA) 10 MG tablet Take 1 tablet by mouth daily.     Magnesium 250 MG TABS Take 250 mg by mouth daily.     ondansetron  (ZOFRAN ) 4 MG tablet Take 1 tablet (4 mg total) by mouth every 8 (eight) hours as needed for nausea or vomiting. 20 tablet 3   tiZANidine (ZANAFLEX) 2 MG tablet Take 2 mg by mouth at bedtime.      TOUJEO  SOLOSTAR 300 UNIT/ML Solostar Pen INJECT 60 UNITS SUBCUTANEOUSLY AT BEDTIME AND 20 UNITS AT LUNCH TIME (Patient taking differently: Inject 60  Units into the skin at bedtime.) 9 mL 1   traMADol  (ULTRAM ) 50 MG tablet Take 100 mg by mouth 2 (two) times daily.     ULTRACARE PEN NEEDLES 33G X 4 MM MISC USE AS DIRECTED. TWICE DAILY 100 each 12   zinc gluconate 50 MG tablet Take 50 mg by mouth daily.      Pertinent medications related to GI and procedure were reviewed by me with the patient prior to the procedure   Current Facility-Administered Medications:    0.9 %  sodium chloride  infusion, , Intravenous, Continuous, Onita Elspeth Sharper, DO, Last Rate: 20 mL/hr at 08/02/24 0740, New Bag at 08/02/24 0740  sodium chloride  20 mL/hr at 08/02/24 0740       Allergies  Allergen Reactions   Mounjaro  [Tirzepatide ]     GI upset   Ozempic  (0.25 Or 0.5 Mg-Dose) [Semaglutide (0.25 Or 0.5mg -Dos)] Other (See Comments)    GI upset   Prednisone  Palpitations    Increases BG and HR.   Allergies were reviewed by me prior to the procedure  Objective   Body mass index is 38.36 kg/m. Vitals:   08/02/24 0738  Resp: 16  Temp: (!) 96 F (35.6  C)  TempSrc: Temporal  Weight: 92.1 kg  Height: 5' 1 (1.549 m)   HR 67 bpm Bp 183/68  Physical Exam Vitals and nursing note reviewed.  Constitutional:      General: She is not in acute distress.    Appearance: Normal appearance. She is obese. She is not ill-appearing, toxic-appearing or diaphoretic.  HENT:     Head: Normocephalic and atraumatic.     Nose: Nose normal.     Mouth/Throat:     Mouth: Mucous membranes are moist.     Pharynx: Oropharynx is clear.  Eyes:     General: No scleral icterus.    Extraocular Movements: Extraocular movements intact.  Cardiovascular:     Rate and Rhythm: Normal rate and regular rhythm.     Heart sounds: Normal heart sounds. No murmur heard.    No friction rub. No gallop.  Pulmonary:     Effort: Pulmonary effort is normal. No respiratory distress.     Breath sounds: Normal breath sounds. No wheezing, rhonchi or rales.  Abdominal:     General: Bowel sounds are normal. There is no distension.     Palpations: Abdomen is soft.     Tenderness: There is no abdominal tenderness. There is no guarding or rebound.  Musculoskeletal:     Cervical back: Neck supple.     Right lower leg: No edema.     Left lower leg: No edema.  Skin:    General: Skin is warm and dry.     Coloration: Skin is not jaundiced or pale.  Neurological:     General: No focal deficit present.     Mental Status: She is alert and oriented to person, place, and time. Mental status is at baseline.  Psychiatric:        Mood and Affect: Mood normal.        Behavior: Behavior normal.        Thought Content: Thought content normal.        Judgment: Judgment normal.      Assessment:  [name] is a 68 y.o. female  who presents today for Esophagogastroduodenoscopy and Colonoscopy for Dysphagia, GERD, Lynch syndrome, personal history of colon polyps .  Plan:  Esophagogastroduodenoscopy and Colonoscopy with possible intervention today  Esophagogastroduodenoscopy and  Colonoscopy with possible biopsy, control of bleeding, polypectomy, and interventions as necessary has been discussed with the patient/patient representative. Informed consent was obtained from the patient/patient representative after explaining the indication, nature, and risks of the procedure including but not limited to death, bleeding, perforation, missed neoplasm/lesions, cardiorespiratory compromise, and reaction to medications. Opportunity for questions was given and appropriate answers were provided. Patient/patient representative has verbalized understanding is amenable to undergoing the procedure.   Elspeth Ozell Jungling, DO  [facility]  Portions of the record may have been created with voice recognition software. Occasional wrong-word or 'sound-a-like' substitutions may have occurred due to the inherent limitations of voice recognition software.  Read the chart carefully and recognize, using context, where substitutions may have occurred.

## 2024-08-02 NOTE — Op Note (Signed)
 [facility] Gastroenterology Patient Name: Sierra Copeland Procedure Date: 08/02/2024 7:22 AM MRN: [id number] Account #: [id number] Date of Birth: 08-18-1956 Admit Type: Outpatient Age: 68 Room: [facility] ENDO ROOM 1 Gender: Female Note Status: Finalized Instrument Name: Upper GI Scope [id number] Procedure:             Upper GI endoscopy Indications:           Lynch syndrome, dysphagia, gerd Providers:             Elspeth Ozell Onita ROSALEA, DO Referring MD:          Duwaine MYRTIS Louder (Referring MD) Medicines:             Monitored Anesthesia Care Complications:         No immediate complications. Estimated blood loss:                         Minimal. Procedure:             Pre-Anesthesia Assessment:                        - Prior to the procedure, a History and Physical was                         performed, and patient medications and allergies were                         reviewed. The patient is competent. The risks and                         benefits of the procedure and the sedation options and                         risks were discussed with the patient. All questions                         were answered and informed consent was obtained.                         Patient identification and proposed procedure were                         verified by the physician, the nurse, the anesthetist                         and the technician in the endoscopy suite. Mental                         Status Examination: alert and oriented. Airway                         Examination: normal oropharyngeal airway and neck                         mobility. Respiratory Examination: clear to                         auscultation. CV Examination: RRR, no murmurs, no S3  or S4. Prophylactic Antibiotics: The patient does not                         require prophylactic antibiotics. Prior                         Anticoagulants: The patient has taken no anticoagulant                          or antiplatelet agents. ASA Grade Assessment: III - A                         patient with severe systemic disease. After reviewing                         the risks and benefits, the patient was deemed in                         satisfactory condition to undergo the procedure. The                         anesthesia plan was to use monitored anesthesia care                         (MAC). Immediately prior to administration of                         medications, the patient was re-assessed for adequacy                         to receive sedatives. The heart rate, respiratory                         rate, oxygen saturations, blood pressure, adequacy of                         pulmonary ventilation, and response to care were                         monitored throughout the procedure. The physical                         status of the patient was re-assessed after the                         procedure.                        After obtaining informed consent, the endoscope was                         passed under direct vision. Throughout the procedure,                         the patient's blood pressure, pulse, and oxygen                         saturations were monitored continuously. The Endoscope  was introduced through the mouth, and advanced to the                         third part of duodenum. The upper GI endoscopy was                         accomplished without difficulty. The patient tolerated                         the procedure well. Findings:      The duodenal bulb, first portion of the duodenum, second portion of the       duodenum and third portion of the duodenum were normal. Estimated blood       loss: none.      Localized mild inflammation characterized by erythema was found in the       gastric antrum. Biopsies were taken with a cold forceps for Helicobacter       pylori testing. Estimated blood loss was minimal.      The exam of  the stomach was otherwise normal.      Esophagogastric landmarks were identified: the gastroesophageal junction       was found at 36 cm from the incisors.      The Z-line was regular.      Abnormal motility was noted in the esophagus. The cricopharyngeus was       normal. There is spasticity of the esophageal body. The distal       esophagus/lower esophageal sphincter is open. A TTS dilator was passed       through the scope. Dilation with a 15-16.5-18 mm balloon dilator was       performed to 15 mm, 16.5 mm and 18 mm. The dilation site was examined       following endoscope reinsertion and showed no change. Estimated blood       loss: none.      Normal mucosa was found in the entire esophagus. Biopsies were obtained       from the proximal and distal esophagus with cold forceps for histology       of suspected eosinophilic esophagitis. Estimated blood loss was minimal.      The exam of the esophagus was otherwise normal. Impression:            - Normal duodenal bulb, first portion of the duodenum,                         second portion of the duodenum and third portion of                         the duodenum.                        - Gastritis. Biopsied.                        - Esophagogastric landmarks identified.                        - Z-line regular.                        - Abnormal esophageal motility, established esophageal  spasm. Dilated.                        - Normal mucosa was found in the entire esophagus.                        - Biopsies were taken with a cold forceps for                         evaluation of eosinophilic esophagitis. Recommendation:        - Patient has a contact number available for                         emergencies. The signs and symptoms of potential                         delayed complications were discussed with the patient.                         Return to normal activities tomorrow. Written                          discharge instructions were provided to the patient.                        - Discharge patient to home.                        - Resume previous diet.                        - Continue present medications.                        - Await pathology results.                        - Repeat upper endoscopy PRN for retreatment.                        - Return to referring physician as previously                         scheduled.                        - The findings and recommendations were discussed with                         the patient. Procedure Code(s):     --- Professional ---                        [phone], Esophagogastroduodenoscopy, flexible,                         transoral; with transendoscopic balloon dilation of                         esophagus (less than 30 mm diameter)  43239, 59, Esophagogastroduodenoscopy, flexible,                         transoral; with biopsy, single or multiple Diagnosis Code(s):     --- Professional ---                        K29.70, Gastritis, unspecified, without bleeding                        K22.4, Dyskinesia of esophagus CPT copyright 2022 American Medical Association. All rights reserved. The codes documented in this report are preliminary and upon coder review may  be revised to meet current compliance requirements. Attending Participation:      I personally performed the entire procedure. Elspeth Jungling, DO Elspeth Ozell Jungling DO, DO 08/02/2024 8:13:17 AM This report has been signed electronically. Number of Addenda: 0 Note Initiated On: 08/02/2024 7:22 AM Estimated Blood Loss:  Estimated blood loss was minimal.      [facility]

## 2024-08-02 NOTE — Interval H&P Note (Signed)
 History and Physical Interval Note: Preprocedure H&P from 08/02/24  was reviewed and there was no interval change after seeing and examining the patient.  Written consent was obtained from the patient after discussion of risks, benefits, and alternatives. Patient has consented to proceed with Esophagogastroduodenoscopy and Colonoscopy with possible intervention   08/02/2024 7:44 AM  [name]  has presented today for surgery, with the diagnosis of K21.9 (ICD-10-CM) - Gastroesophageal reflux disease, unspecified whether esophagitis present R13.10 (ICD-10-CM) - Dysphagia, unspecified type Z86.0100 (ICD-10-CM) - History of colon polyps Z15.09 (ICD-10-CM) - Lynch syndrome.  The various methods of treatment have been discussed with the patient and family. After consideration of risks, benefits and other options for treatment, the patient has consented to  Procedure(s) with comments: COLONOSCOPY (N/A) - IDDM EGD (ESOPHAGOGASTRODUODENOSCOPY) (N/A) as a surgical intervention.  The patient's history has been reviewed, patient examined, no change in status, stable for surgery.  I have reviewed the patient's chart and labs.  Questions were answered to the patient's satisfaction.     Elspeth Ozell Jungling

## 2024-08-03 LAB — SURGICAL PATHOLOGY

## 2024-08-06 ENCOUNTER — Encounter: Payer: Self-pay | Admitting: Family Medicine

## 2024-08-20 ENCOUNTER — Encounter: Payer: Self-pay | Admitting: Family Medicine

## 2024-08-21 LAB — HM COLONOSCOPY

## 2024-08-23 ENCOUNTER — Encounter: Payer: Self-pay | Admitting: Family Medicine

## 2024-08-23 DIAGNOSIS — Z79899 Other long term (current) drug therapy: Secondary | ICD-10-CM | POA: Diagnosis not present

## 2024-08-23 DIAGNOSIS — M5126 Other intervertebral disc displacement, lumbar region: Secondary | ICD-10-CM | POA: Diagnosis not present

## 2024-08-23 DIAGNOSIS — M4802 Spinal stenosis, cervical region: Secondary | ICD-10-CM | POA: Diagnosis not present

## 2024-08-23 DIAGNOSIS — M5416 Radiculopathy, lumbar region: Secondary | ICD-10-CM | POA: Diagnosis not present

## 2024-08-23 DIAGNOSIS — M5412 Radiculopathy, cervical region: Secondary | ICD-10-CM | POA: Diagnosis not present

## 2024-09-03 NOTE — Progress Notes (Unsigned)
   09/04/2024  Patient ID: Sierra Copeland, female   DOB: 1956/07/14, 68 y.o.   MRN: [id number]  Outreach attempt for scheduled telephone follow-up visit was unsuccessful, but I was able to leave a HIPAA compliant voicemail with my direct phone number.  I am also sending a MyChart message to attempt to reschedule the visit and will call the patient again next week if I do not hear back.  [name], PharmD, DPLA

## 2024-09-04 ENCOUNTER — Other Ambulatory Visit: Payer: Self-pay | Admitting: Nurse Practitioner

## 2024-09-04 ENCOUNTER — Other Ambulatory Visit: Payer: Self-pay

## 2024-09-04 ENCOUNTER — Encounter: Payer: Self-pay | Admitting: Family Medicine

## 2024-09-06 NOTE — Telephone Encounter (Signed)
 Requested Prescriptions  Pending Prescriptions Disp Refills   TOUJEO  SOLOSTAR 300 UNIT/ML Solostar Pen [Pharmacy Med Name: TOUJEO  SOLOSTAR 300 UNIT/ML SUBQ SO] 9 mL 1    Sig: INJECT 60 UNITS SUBCUTANEOUSLY AT BEDTIME AND 20 UNITS AT LUNCH TIME     Endocrinology:  Diabetes - Insulins Failed - 09/06/2024  1:39 PM      Failed - HBA1C is between 0 and 7.9 and within 180 days    Hemoglobin A1C  Date Value Ref Range Status  01/30/2020 9.6  Final   HB A1C (BAYER DCA - WAIVED)  Date Value Ref Range Status  11/08/2023 9.0 (H) 4.8 - 5.6 % Final    Comment:             Prediabetes: 5.7 - 6.4          Diabetes: >6.4          Glycemic control for adults with diabetes: <7.0    Hgb A1c MFr Bld  Date Value Ref Range Status  02/06/2024 8.5 (H) 4.8 - 5.6 % Final    Comment:             Prediabetes: 5.7 - 6.4          Diabetes: >6.4          Glycemic control for adults with diabetes: <7.0          Passed - Valid encounter within last 6 months    Recent Outpatient Visits           1 month ago Type 2 diabetes mellitus with hyperglycemia, with long-term current use of insulin  (HCC)   [facility] [place], Megan P, DO   4 months ago Lower abdominal pain   [facility] [place], Melanie T, NP   7 months ago Routine general medical examination at a health care facility   [facility] [place], [place] P, DO   7 months ago Acute cystitis with hematuria   [facility] Melvin Pao, NP

## 2024-09-09 NOTE — Progress Notes (Unsigned)
   09/09/2024  Patient ID: Sierra Copeland, female   DOB: 19-Sep-1956, 68 y.o.   MRN: [id number]  Subjective/Objective Telephone visit to follow-up on management of hypertension and diabetes  Diabetes -Current medications:  metformin  1000mg  BID, Humalog  10-20 units TID with meals, Toujeo  300unit/mL 60 units daily, Jardiance  25mg  daily -Using Libre 3 for CGM- uses reader versus mobile app, so cannot link to [employer] -Patient endorses FBG averaging 90, but does state BG will go up to 250 after supper in the evenings.  She does not want this to go too low due to concern of nocturnal hypoglycemia. -Patient does endorse a few instances of hypoglycemia in the past few weeks where sensor will alert her that BG is <70.  She also states one day her sensor seemed to be providing false lows, but she does not have other testing supplies to verify with a fingerstick. -A1c of 8.6% 8/19 -Provided with Rybelsus  3mg  samples from [facility], and patient started medication but was only able to take for 1 week due to diarrhea and nausea -Using simvastatin  40mg  at bedtime for ASCVD risk reduction; LDL 67, TG 132 on 8/19 -Lisinopril  30mg  and Jardiance  25mg  providing cardiorenal protection  Hypertension -Current medications:  amlodipine  5mg  daily, lisinopril  30mg  daily, hydrochlorothiazide  25mg  daily -Patient states she does now have home BP monitor and has been checking home BP; most recent reading slightly elevated at 139/77 -Patient did start taking hydrochlorothiazide  25mg  after we last spoke >1 month ago  Assessment/Plan  Diabetes -Uncontrolled with A1c >7% -Continue to hold Rybelsus  until follow-up with PCP tomorrow -Continue Toujeo  60 units at bedtime, Humalog  10-20 units TID, metformin  1000mg  BID, Jardiance  25mg  dialy -Could consider PRN loperamide and ondansetron  to see if patient could then tolerate Rybelsus  3mg  daily; symptoms should improve over time.  Otherwise, since patient also did not tolerate  Ozempic  or Mounjaro  either, we could try Trulicity  to see if she could tolerate this medication any better- test claim reflects $0 copay.  Could also adjust insulin  if needed- could move Toujeo  to the morning to further help prevent nocturnal hypoglycemia and increase mealtime insulin  10%. -Continue Libre 3+ for CGM -LDL is at goal -Last UACR was 30-300 with SGLT2 and ACEi on board for cardiorenal protection; continue to monitor regularly- patient may benefit from referral to nephrology  Hypertension -Uncontrolled, but home readings reflect improvment -Continue current regimen and this time and regular monitoring of home BP -If consistently >130/80, consider increasing amlodipine  to 10mg  or lisinopril  to 40mg  daily  Follow-up: 8 weeks  [name], PharmD, DPLA

## 2024-09-10 ENCOUNTER — Other Ambulatory Visit: Payer: Self-pay

## 2024-09-10 DIAGNOSIS — I129 Hypertensive chronic kidney disease with stage 1 through stage 4 chronic kidney disease, or unspecified chronic kidney disease: Secondary | ICD-10-CM

## 2024-09-10 DIAGNOSIS — E1165 Type 2 diabetes mellitus with hyperglycemia: Secondary | ICD-10-CM

## 2024-09-11 ENCOUNTER — Ambulatory Visit: Payer: Self-pay

## 2024-09-11 ENCOUNTER — Ambulatory Visit: Admitting: Family Medicine

## 2024-09-11 NOTE — Telephone Encounter (Signed)
 FYI Only or Action Required?: Action required by provider: clinical question for provider.  Patient was last seen in primary care on 07/17/2024 by Vicci Duwaine SQUIBB, DO.  Called Nurse Triage reporting Hypotension.  Symptoms began yesterday: no BP issues today  Interventions attempted: hydrochlorothiazide  (HYDRODIURIL ) 25 MG tablet last night due to low BP reading.  Symptoms are: stable.  Triage Disposition: See PCP When Office is Open (Within 3 Days)  Patient/caregiver understands and will follow disposition?: No, wishes to speak with PCP   Copied from CRM #[id number]. Topic: Clinical - Medical Advice >> Sep 11, 2024 10:28 AM Corin V wrote: Reason for CRM: Patient blood pressure dropped to 111/58 while she was at the pharmacy yesterday. She is unsure is this is something that needs to be addressed or not. She was supposed to come in for a visit today but had to cancel due to stomach problems. This morning is was 138/79.  Call back: [phone] Reason for Disposition  [1] Fall in systolic BP > 20 mm Hg from normal AND [2] NOT feeling weak or lightheaded    Pt refused new appt stated she has an appt in Nov 2025- pt wants to wait on PCP's response.  Answer Assessment - Initial Assessment Questions 1. BLOOD PRESSURE: What is your blood pressure? Did you take at least two measurements 5 minutes apart?     139/76 2. ONSET: When did you take your blood pressure?     yesterday 3. HOW: How did you take your blood pressure? (e.g., visiting nurse, automatic home BP monitor)     automatic 4. HISTORY: Do you have a history of low blood pressure? What is your blood pressure normally?     yes 5. MEDICINES: Are you taking any medicines for blood pressure? If Yes, ask: Have they been changed recently?   New medication  6. PULSE RATE: Do you know what your pulse rate is?      na 7. OTHER SYMPTOMS: Have you been sick recently? Have you had a recent injury?     Weakness, felt  like about to pass 8. PREGNANCY: Is there any chance you are pregnant? When was your last menstrual period?     na  Skipped lisinopril  last due earlier BP   139/76.  Pt stated BP has dropped in 60's for bottom.  hydrochlorothiazide  (HYDRODIURIL ) 25 MG tablet new medication started in 06/2024.  Pt not having any s/s today with BP.    Pt stated reporting low BP PCP: not able to come in today due to stomach issues - pt stated not related to BP.  Pt requested NT to route to PCP to make aware of yesterdays s/s and ask PCP to determine next steps for pt and make pt aware if change in plan of care.  Pt stated as long as her BP remains good today she will restart hydrochlorothiazide  (HYDRODIURIL ) 25 MG tablet tonight.  Protocols used: Blood Pressure - Low-A-AH

## 2024-09-11 NOTE — Telephone Encounter (Signed)
 Routing to provider to advise or any recommendations.

## 2024-09-18 ENCOUNTER — Encounter: Payer: Self-pay | Admitting: Family Medicine

## 2024-09-18 ENCOUNTER — Ambulatory Visit (INDEPENDENT_AMBULATORY_CARE_PROVIDER_SITE_OTHER): Admitting: Family Medicine

## 2024-09-18 VITALS — BP 129/75 | HR 81 | Temp 98.0°F | Ht 61.0 in | Wt 205.0 lb

## 2024-09-18 DIAGNOSIS — Z23 Encounter for immunization: Secondary | ICD-10-CM | POA: Diagnosis not present

## 2024-09-18 DIAGNOSIS — Z794 Long term (current) use of insulin: Secondary | ICD-10-CM

## 2024-09-18 DIAGNOSIS — E1165 Type 2 diabetes mellitus with hyperglycemia: Secondary | ICD-10-CM | POA: Diagnosis not present

## 2024-09-18 DIAGNOSIS — R42 Dizziness and giddiness: Secondary | ICD-10-CM

## 2024-09-18 DIAGNOSIS — I129 Hypertensive chronic kidney disease with stage 1 through stage 4 chronic kidney disease, or unspecified chronic kidney disease: Secondary | ICD-10-CM

## 2024-09-18 LAB — BAYER DCA HB A1C WAIVED: HB A1C (BAYER DCA - WAIVED): 7.7 % — ABNORMAL HIGH (ref 4.8–5.6)

## 2024-09-18 MED ORDER — TOUJEO SOLOSTAR 300 UNIT/ML ~~LOC~~ SOPN: PEN_INJECTOR | SUBCUTANEOUS | 1 refills | Status: DC

## 2024-09-18 NOTE — Assessment & Plan Note (Signed)
 Doing better with A1c of 7.7, but having hypoglycemia. Will change her toujeo  to 50 units at bedtime and 30 units at lunch and recheck by phone in 1 week to see how she's doing. Call with any concerns.

## 2024-09-18 NOTE — Progress Notes (Signed)
 BP 129/75   Pulse 81   Temp 98 F (36.7 C) (Oral)   Ht 5' 1 (1.549 m)   Wt 205 lb (93 kg)   LMP 03/17/2013 (Approximate)   SpO2 96%   BMI 38.73 kg/m    Subjective:    Patient ID: Sierra Copeland, female    DOB: August 26, 1956, 68 y.o.   MRN: [id number]  HPI: LAURABETH Copeland is a 68 y.o. female  Chief Complaint  Patient presents with   Hypertension   Diabetes   DIABETES Hypoglycemic episodes:yes- has had a few lows first thing in the AM prior to breakfast Polydipsia/polyuria: no Visual disturbance: no Chest pain: no Paresthesias: no Glucose Monitoring: yes  Accucheck frequency: continuous  Fasting glucose: 69  Post prandial: 250 Taking Insulin ?: yes  Long acting insulin : 80 units a day divided  Short acting insulin : 10-20 units sliding scale Blood Pressure Monitoring: rarely Retinal Examination: Up to Date Foot Exam: Up to Date Diabetic Education: Completed Pneumovax: Not up to Date Influenza: Up to Date Aspirin: yes  HYPERTENSION- stopped her amlodipine  and her hydrochlorothiazide . Has been lightheaded.  Hypertension status: controlled  Satisfied with current treatment? yes Duration of hypertension: chronic BP monitoring frequency:  rarely BP medication side effects:  no Medication compliance: fair compliance Previous BP meds:amlodipine , hydrochlorothiazide , lisinopril  Aspirin: yes Recurrent headaches: no Visual changes: no Palpitations: no Dyspnea: no Chest pain: no Lower extremity edema: no Dizzy/lightheaded: yes  DIZZINESS Duration: past month Description of symptoms: lightheaded Duration of episode: minutes Dizziness frequency: recurrent Provoking factors: unknown Triggered by rolling over in bed: no Triggered by bending over: no Aggravated by head movement: no Aggravated by exertion, coughing, loud noises: no Recent head injury: no Recent or current viral symptoms: no History of vasovagal episodes: no Nausea: no Vomiting: no Tinnitus:  no Hearing loss: no Aural fullness: no Headache: no Photophobia/phonophobia: no Unsteady gait: no Postural instability: no Diplopia, dysarthria, dysphagia or weakness: no Related to exertion: no Pallor: no Diaphoresis: no Dyspnea: no Chest pain: no   Relevant past medical, surgical, family and social history reviewed and updated as indicated. Interim medical history since our last visit reviewed. Allergies and medications reviewed and updated.  Review of Systems  Constitutional: Negative.   Respiratory: Negative.    Cardiovascular: Negative.   Musculoskeletal: Negative.   Neurological:  Positive for dizziness and light-headedness. Negative for tremors, seizures, syncope, facial asymmetry, speech difficulty, weakness, numbness and headaches.    Per HPI unless specifically indicated above     Objective:    BP 129/75   Pulse 81   Temp 98 F (36.7 C) (Oral)   Ht 5' 1 (1.549 m)   Wt 205 lb (93 kg)   LMP 03/17/2013 (Approximate)   SpO2 96%   BMI 38.73 kg/m   Wt Readings from Last 3 Encounters:  09/18/24 205 lb (93 kg)  08/02/24 203 lb (92.1 kg)  07/17/24 207 lb (93.9 kg)    Physical Exam Vitals and nursing note reviewed.  Constitutional:      General: She is not in acute distress.    Appearance: Normal appearance. She is obese. She is not ill-appearing, toxic-appearing or diaphoretic.  HENT:     Head: Normocephalic and atraumatic.     Right Ear: External ear normal.     Left Ear: External ear normal.     Nose: Nose normal.     Mouth/Throat:     Mouth: Mucous membranes are moist.     Pharynx: Oropharynx is clear.  Eyes:     General: No scleral icterus.       Right eye: No discharge.        Left eye: No discharge.     Extraocular Movements: Extraocular movements intact.     Conjunctiva/sclera: Conjunctivae normal.     Pupils: Pupils are equal, round, and reactive to light.  Cardiovascular:     Rate and Rhythm: Normal rate and regular rhythm.     Pulses:  Normal pulses.     Heart sounds: Normal heart sounds. No murmur heard.    No friction rub. No gallop.  Pulmonary:     Effort: Pulmonary effort is normal. No respiratory distress.     Breath sounds: Normal breath sounds. No stridor. No wheezing, rhonchi or rales.  Chest:     Chest wall: No tenderness.  Musculoskeletal:        General: Normal range of motion.     Cervical back: Normal range of motion and neck supple.  Skin:    General: Skin is warm and dry.     Capillary Refill: Capillary refill takes less than 2 seconds.     Coloration: Skin is not jaundiced or pale.     Findings: No bruising, erythema, lesion or rash.  Neurological:     General: No focal deficit present.     Mental Status: She is alert and oriented to person, place, and time. Mental status is at baseline.  Psychiatric:        Mood and Affect: Mood normal.        Behavior: Behavior normal.        Thought Content: Thought content normal.        Judgment: Judgment normal.     Results for orders placed or performed in visit on 08/23/24  HM COLONOSCOPY   Collection Time: 08/21/24 12:00 AM  Result Value Ref Range   HM Colonoscopy See Report (in chart) See Report (in chart), Patient Reported      Assessment & Plan:   Problem List Items Addressed This Visit       Endocrine   Type 2 diabetes mellitus with hyperglycemia (HCC) - Primary   Doing better with A1c of 7.7, but having hypoglycemia. Will change her toujeo  to 50 units at bedtime and 30 units at lunch and recheck by phone in 1 week to see how she's doing. Call with any concerns.       Relevant Medications   insulin  glargine, 1 Unit Dial , (TOUJEO  SOLOSTAR) 300 UNIT/ML Solostar Pen   Other Relevant Orders   Bayer DCA Hb A1c Waived     Genitourinary   Benign hypertensive renal disease   Under good control on current regimen. Continue current regimen. Continue to monitor. Call with any concerns. Refills given. Labs drawn today.        Other Visit  Diagnoses       Lightheaded       Likely muti-factorial. Will try to get BP and sugars under better control. Follow up 1 week by phone. Call with any concerns.        Follow up plan: Return in about 4 weeks (around 10/16/2024).

## 2024-09-18 NOTE — Assessment & Plan Note (Signed)
 Under good control on current regimen. Continue current regimen. Continue to monitor. Call with any concerns. Refills given. Labs drawn today.

## 2024-09-26 ENCOUNTER — Encounter: Payer: Self-pay | Admitting: Family Medicine

## 2024-09-26 ENCOUNTER — Telehealth: Admitting: Family Medicine

## 2024-09-26 VITALS — Ht 61.0 in | Wt 203.0 lb

## 2024-09-26 DIAGNOSIS — Z794 Long term (current) use of insulin: Secondary | ICD-10-CM

## 2024-09-26 DIAGNOSIS — E1165 Type 2 diabetes mellitus with hyperglycemia: Secondary | ICD-10-CM

## 2024-09-26 NOTE — Assessment & Plan Note (Signed)
 Sugars evening out a bit- still high post-prandial but not having hypoglycemia as much. Continue current regimen. Recheck 1 month as scheduled. Call with any concerns.

## 2024-09-26 NOTE — Progress Notes (Signed)
 Ht 5' 1 (1.549 m)   Wt 203 lb (92.1 kg)   LMP 03/17/2013 (Approximate)   BMI 38.36 kg/m    Subjective:    Patient ID: Sierra Copeland, female    DOB: Jul 16, 1956, 68 y.o.   MRN: [id number]  HPI: Sierra Copeland is a 68 y.o. female  Chief Complaint  Patient presents with   Diabetes    BGL @ 98 this morning  Avg 145 Bedtime last night 60   DIABETES- no more dizziness spells Hypoglycemic episodes: 1x at 3AM- 60 Polydipsia/polyuria: no Visual disturbance: no Chest pain: no Paresthesias: no Glucose Monitoring: yes  Accucheck frequency: continuous  Fasting glucose: 98- 100s  Post prandial: 250 Taking Insulin ?: yes             Long acting insulin : 80 units a day divided             Short acting insulin : 10-20 units sliding scale Blood Pressure Monitoring: not checking Retinal Examination: Up to Date Foot Exam: Up to Date Diabetic Education: Completed Pneumovax: Not up to Date Influenza: Up to Date Aspirin: yes  Relevant past medical, surgical, family and social history reviewed and updated as indicated. Interim medical history since our last visit reviewed. Allergies and medications reviewed and updated.  Review of Systems  Constitutional: Negative.   Respiratory: Negative.    Cardiovascular: Negative.   Musculoskeletal: Negative.   Neurological: Negative.   Psychiatric/Behavioral: Negative.      Per HPI unless specifically indicated above     Objective:    Ht 5' 1 (1.549 m)   Wt 203 lb (92.1 kg)   LMP 03/17/2013 (Approximate)   BMI 38.36 kg/m   Wt Readings from Last 3 Encounters:  09/26/24 203 lb (92.1 kg)  09/18/24 205 lb (93 kg)  08/02/24 203 lb (92.1 kg)    Physical Exam Vitals and nursing note reviewed.  Constitutional:      General: She is not in acute distress.    Appearance: Normal appearance. She is not ill-appearing, toxic-appearing or diaphoretic.  HENT:     Head: Normocephalic and atraumatic.     Right Ear: External ear normal.     Left  Ear: External ear normal.     Nose: Nose normal.     Mouth/Throat:     Mouth: Mucous membranes are moist.     Pharynx: Oropharynx is clear.  Eyes:     General: No scleral icterus.       Right eye: No discharge.        Left eye: No discharge.     Conjunctiva/sclera: Conjunctivae normal.     Pupils: Pupils are equal, round, and reactive to light.  Pulmonary:     Effort: Pulmonary effort is normal. No respiratory distress.     Comments: Speaking in full sentences Musculoskeletal:        General: Normal range of motion.     Cervical back: Normal range of motion.  Skin:    Coloration: Skin is not jaundiced or pale.     Findings: No bruising, erythema, lesion or rash.  Neurological:     Mental Status: She is alert and oriented to person, place, and time. Mental status is at baseline.  Psychiatric:        Mood and Affect: Mood normal.        Behavior: Behavior normal.        Thought Content: Thought content normal.        Judgment: Judgment normal.  Results for orders placed or performed in visit on 09/18/24  Bayer DCA Hb A1c Waived   Collection Time: 09/18/24  1:32 PM  Result Value Ref Range   HB A1C (BAYER DCA - WAIVED) 7.7 (H) 4.8 - 5.6 %      Assessment & Plan:   Problem List Items Addressed This Visit       Endocrine   Type 2 diabetes mellitus with hyperglycemia (HCC) - Primary   Sugars evening out a bit- still high post-prandial but not having hypoglycemia as much. Continue current regimen. Recheck 1 month as scheduled. Call with any concerns.         Follow up plan: Return for As scheduled.   This visit was completed via video visit through MyChart due to the restrictions of the COVID-19 pandemic. All issues as above were discussed and addressed. Physical exam was done as above through visual confirmation on video through MyChart. If it was felt that the patient should be evaluated in the office, they were directed there. The patient verbally consented to this  visit. Location of the patient: home Location of the provider: work Those involved with this call:  Provider: Duwaine Louder, DO CMA: York Fogo, CMA, Front Desk/Registration: Claretta Maiden  Time spent on call: 15 minutes with patient face to face via video conference. More than 50% of this time was spent in counseling and coordination of care. 23 minutes total spent in review of patient's record and preparation of their chart.

## 2024-10-11 ENCOUNTER — Ambulatory Visit: Admitting: Family Medicine

## 2024-10-16 ENCOUNTER — Telehealth: Payer: Self-pay | Admitting: Family Medicine

## 2024-10-16 NOTE — Telephone Encounter (Signed)
 Copied from CRM #[id number]. Topic: Medicare AWV >> Oct 16, 2024  1:53 PM Nathanel DEL wrote: LVM 10/15/2024 to reschedule 11/13/24 AWV appt due to [facility] out.  New AWV appt date 12/06/24 at 1:50pm. Please call to confirm date change.   Nathanel Paschal; Care Guide Ambulatory Clinical Support [facility] [facility] Direct Dial : [phone]

## 2024-10-17 ENCOUNTER — Ambulatory Visit: Admitting: Family Medicine

## 2024-10-23 ENCOUNTER — Other Ambulatory Visit: Payer: Self-pay | Admitting: Family Medicine

## 2024-10-24 NOTE — Telephone Encounter (Signed)
 Requested Prescriptions  Pending Prescriptions Disp Refills   lisinopril  (ZESTRIL ) 30 MG tablet [Pharmacy Med Name: LISINOPRIL  30 MG TAB] 90 tablet 0    Sig: TAKE 1 TABLET BY MOUTH ONCE DAILY     Cardiovascular:  ACE Inhibitors Failed - 10/24/2024  2:22 PM      Failed - Cr in normal range and within 180 days    Creatinine, Ser  Date Value Ref Range Status  07/17/2024 1.11 (H) 0.57 - 1.00 mg/dL Final         Passed - K in normal range and within 180 days    Potassium  Date Value Ref Range Status  07/17/2024 4.6 3.5 - 5.2 mmol/L Final         Passed - Patient is not pregnant      Passed - Last BP in normal range    BP Readings from Last 1 Encounters:  09/18/24 129/75         Passed - Valid encounter within last 6 months    Recent Outpatient Visits           4 weeks ago Type 2 diabetes mellitus with hyperglycemia, with long-term current use of insulin  (HCC)   [place] [facility] Snelling, Megan P, DO   1 month ago Type 2 diabetes mellitus with hyperglycemia, with long-term current use of insulin  (HCC)   [place] [facility] [place], Megan P, DO   3 months ago Type 2 diabetes mellitus with hyperglycemia, with long-term current use of insulin  [facility])   [place] [facility] Mark, Megan P, DO   6 months ago Lower abdominal pain   [place] [facility] Hep[place], Melanie T, NP   8 months ago Routine general medical examination at a health care facility   [facility] Strong, [place], [place]

## 2024-11-07 NOTE — Progress Notes (Signed)
° °  11/08/24  Patient ID: Sierra Copeland, female   DOB: [birth year], 68 y.o.   MRN: [id number]  Outreach attempt for scheduled telephone follow-up visit was not successful, but I was able to leave a HIPAA compliant voicemail with my direct phone number.  I will attempt to contact the patient again next week if I do not hear back.  [name], PharmD, DPLA

## 2024-11-08 ENCOUNTER — Other Ambulatory Visit (INDEPENDENT_AMBULATORY_CARE_PROVIDER_SITE_OTHER): Payer: Self-pay

## 2024-11-08 DIAGNOSIS — E7800 Pure hypercholesterolemia, unspecified: Secondary | ICD-10-CM

## 2024-11-08 DIAGNOSIS — E1165 Type 2 diabetes mellitus with hyperglycemia: Secondary | ICD-10-CM

## 2024-11-08 DIAGNOSIS — Z794 Long term (current) use of insulin: Secondary | ICD-10-CM

## 2024-11-08 NOTE — Progress Notes (Signed)
° °  11/08/2024  Patient ID: Sierra Copeland, female   DOB: 02-05-1956, 68 y.o.   MRN: [id number]  Subjective/Objective Telephone visit to follow-up on management of diabetes  Diabetes -Current medications:  metformin  1000mg  BID, Humalog  10 units TID with meals, Toujeo  300unit/mL 50 units at bedtime and 30 units at lunch, Jardiance  25mg  daily -Did not tolerate GLP1 medications (Mounjaro , Ozempic , or Rybelsus ) in the past (GI upset) -Uses Libre 3 for CGM but most recent sensor fell off, and she has not yet replaced.  She is currently using fingersticks to check home BG and states FBG is averaging 120-130. -Patient was experiencing hypoglycemia, so Toujeo  dosing was changed, and she states she has just had 1 low reading of 69 recently -ACEi/ARB for cardiorenal protection:  lisinopril  30mg  daily; last OV BP of 129/75 on 10/21 -UACR 30-300 in May -Statin for ASCVD risk reduction:  simvastatin  40mg  daily  Hypertension -Current medications:  lisinopril  30 mg daily -Patient does have a home BP monitor, but she has not been checking regularly -Hydrochlorothiazide  and amlodipine  stopped recently due to s/sx of hypotension (dizziness/lightheadedness) -OV BP was 129/75 on 10/21  Lab Results  Component Value Date   HGBA1C 7.7 (H) 09/18/2024   HGBA1C 8.5 (H) 02/06/2024   HGBA1C 9.0 (H) 11/08/2023  Lipid Panel     Component Value Date/Time   CHOL 144 07/17/2024 1138   CHOL 151 06/04/2016 0828   TRIG 132 07/17/2024 1138   TRIG 147 06/04/2016 0828   HDL 54 07/17/2024 1138   VLDL 29 06/04/2016 0828   LDLCALC 67 07/17/2024 1138   LABVLDL 23 07/17/2024 1138        Component Value Date/Time   NA 142 07/17/2024 1138   K 4.6 07/17/2024 1138   CL 104 07/17/2024 1138   CO2 21 07/17/2024 1138   GLUCOSE 120 (H) 07/17/2024 1138   GLUCOSE 203 (H) 02/19/2022 1357   BUN 22 07/17/2024 1138   CREATININE 1.11 (H) 07/17/2024 1138   CALCIUM 9.6 07/17/2024 1138   PROT 6.3 07/17/2024 1138   ALBUMIN 4.1  07/17/2024 1138   AST 17 07/17/2024 1138   ALT 15 07/17/2024 1138   ALKPHOS 36 (L) 07/17/2024 1138   BILITOT 0.3 07/17/2024 1138   EGFR 55 (L) 07/17/2024 1138   GFRNONAA >60 02/19/2022 1357    Assessment/Plan  Diabetes -Uncontrolled with A1c >7%, but home readings reflect improved control -BP and LDL at goal -Last UACR was 30-300 with SGLT2 and ACEi on board for cardiorenal protection; continue to monitor regularly- patient may benefit from referral to nephrology -Continue current regimen at this time -Continue Libre 3+ for CGM- patient plans to contact [employer] for replacement sensor.  Continue to use fingersticks to monitor BG until received. -Sees PCP again 1/9 and will be due for A1c, CMP, UACR, and lipid panel  -Could consider trial of Trulicity  in the future; test claim reflects $0 copay- would recommend PRN ondansetron  and loperamide if prescribed based on GI side effects with other GLP1 medications  Hypertension -Controlled -Continue current regimen and this time   Follow-up: 3 months  Sierra Copeland, PharmD, DPLA

## 2024-11-20 ENCOUNTER — Encounter

## 2024-12-06 ENCOUNTER — Ambulatory Visit: Admitting: Emergency Medicine

## 2024-12-06 VITALS — Ht 61.0 in | Wt 193.0 lb

## 2024-12-06 DIAGNOSIS — Z0000 Encounter for general adult medical examination without abnormal findings: Secondary | ICD-10-CM | POA: Diagnosis not present

## 2024-12-06 NOTE — Patient Instructions (Addendum)
 Sierra Copeland,  Thank you for taking the time for your Medicare Wellness Visit. I appreciate your continued commitment to your health goals. Please review the care plan we discussed, and feel free to reach out if I can assist you further.  Please note that Annual Wellness Visits do not include a physical exam. Some assessments may be limited, especially if the visit was conducted virtually. If needed, we may recommend an in-person follow-up with your provider.  Ongoing Care Seeing your primary care provider every 3 to 6 months helps us  monitor your health and provide consistent, personalized care.   Referrals If a referral was made during today's visit and you haven't received any updates within two weeks, please contact the referred provider directly to check on the status.  Recommended Screenings:  Keep appointment with Dr. Vicci scheduled 12/07/24 @ 2:20pm. Keep mammogram appointment scheduled 12/26/24.   Health Maintenance  Topic Date Due   Zoster (Shingles) Vaccine (1 of 2) Never done   Medicare Annual Wellness Visit  11/07/2024   Breast Cancer Screening  10/19/2024   Complete foot exam   11/07/2024   Hemoglobin A1C  03/19/2025   Yearly kidney health urinalysis for diabetes  04/20/2025   Eye exam for diabetics  06/14/2025   Yearly kidney function blood test for diabetes  07/17/2025   DTaP/Tdap/Td vaccine (2 - Td or Tdap) 12/03/2025   Colon Cancer Screening  08/21/2026   Osteoporosis screening with Bone Density Scan  03/01/2029   Pneumococcal Vaccine for age over 43  Completed   Flu Shot  Completed   Hepatitis C Screening  Completed   Meningitis B Vaccine  Aged Out   COVID-19 Vaccine  Discontinued       12/06/2024    1:57 PM  Advanced Directives  Does Patient Have a Medical Advance Directive? Yes  Type of Advance Directive Healthcare Power of Attorney  Does patient want to make changes to medical advance directive? No - Patient declined  Copy of Healthcare Power of Attorney  in Chart? No - copy requested    Vision: Annual vision screenings are recommended for early detection of glaucoma, cataracts, and diabetic retinopathy. These exams can also reveal signs of chronic conditions such as diabetes and high blood pressure.  Dental: Annual dental screenings help detect early signs of oral cancer, gum disease, and other conditions linked to overall health, including heart disease and diabetes.  Please see the attached documents for additional preventive care recommendations.

## 2024-12-06 NOTE — Progress Notes (Signed)
 "  Chief Complaint  Patient presents with   Medicare Wellness     Subjective:   Sierra Copeland is a 69 y.o. female who presents for a Medicare Annual Wellness Visit.  Visit info / Clinical Intake: Medicare Wellness Visit Type:: Subsequent Annual Wellness Visit Persons participating in visit and providing information:: patient Medicare Wellness Visit Mode:: Telephone If telephone:: video declined Since this visit was completed virtually, some vitals may be partially provided or unavailable. Missing vitals are due to the limitations of the virtual format.: Documented vitals are patient reported If Telephone or Video please confirm:: I connected with patient using audio/video enable telemedicine. I verified patient identity with two identifiers, discussed telehealth limitations, and patient agreed to proceed. Patient Location:: home Provider Location:: clinic office Interpreter Needed?: No Pre-visit prep was completed: yes AWV questionnaire completed by patient prior to visit?: yes Date:: 12/06/24 Living arrangements:: (!) lives alone Patient's Overall Health Status Rating: (!) fair Typical amount of pain: (!) a lot Does pain affect daily life?: (!) yes Are you currently prescribed opioids?: (!) yes  Dietary Habits and Nutritional Risks How many meals a day?: 2 Eats fruit and vegetables daily?: (!) no Most meals are obtained by: preparing own meals In the last 2 weeks, have you had any of the following?: (!) nausea, vomiting, diarrhea (diarrhea) Diabetic:: (!) yes Any non-healing wounds?: no How often do you check your BS?: continuous glucose monitor (FBS 120 per patient) Would you like to be referred to a Nutritionist or for Diabetic Management? : no  Functional Status Activities of Daily Living (to include ambulation/medication): Independent Ambulation: Independent with device- listed below Home Assistive Devices/Equipment: Eyeglasses (glasses for reading) Medication  Administration: Independent Home Management (perform basic housework or laundry): Independent Manage your own finances?: yes Primary transportation is: driving Concerns about vision?: no *vision screening is required for WTM* Concerns about hearing?: no  Fall Screening Falls in the past year?: 0 Number of falls in past year: 0 Was there an injury with Fall?: 0 Fall Risk Category Calculator: 0 Patient Fall Risk Level: Low Fall Risk  Fall Risk Patient at Risk for Falls Due to: No Fall Risks Fall risk Follow up: Falls evaluation completed  Home and Transportation Safety: All rugs have non-skid backing?: yes All stairs or steps have railings?: yes Grab bars in the bathtub or shower?: (!) no Have non-skid surface in bathtub or shower?: (!) no Good home lighting?: yes Regular seat belt use?: yes Hospital stays in the last year:: no  Cognitive Assessment Difficulty concentrating, remembering, or making decisions? : no Will 6CIT or Mini Cog be Completed: yes What year is it?: 0 points What month is it?: 0 points Give patient an address phrase to remember (5 components): [address] [place] About what time is it?: 0 points Count backwards from 20 to 1: 0 points Say the months of the year in reverse: 0 points Repeat the address phrase from earlier: 0 points 6 CIT Score: 0 points  Advance Directives (For Healthcare) Does Patient Have a Medical Advance Directive?: Yes Does patient want to make changes to medical advance directive?: No - Patient declined Type of Advance Directive: Healthcare Power of Attorney Copy of Healthcare Power of Attorney in Chart?: No - copy requested Would patient like information on creating a medical advance directive?: No - Patient declined  Reviewed/Updated  Reviewed/Updated: Reviewed All (Medical, Surgical, Family, Medications, Allergies, Care Teams, Patient Goals)    Allergies (verified) Mounjaro  [tirzepatide ], Ozempic  (0.25 or 0.5  mg-dose)  [semaglutide (0.25 or 0.5mg -dos)], and Prednisone    Current Medications (verified) Outpatient Encounter Medications as of 12/06/2024  Medication Sig   acetaminophen  (TYLENOL ) 650 MG CR tablet Take 650 mg by mouth every 8 (eight) hours as needed for pain.   aspirin-acetaminophen -caffeine (EXCEDRIN MIGRAINE) 250-250-65 MG tablet Take 1 tablet by mouth every 6 (six) hours as needed for headache.   busPIRone  (BUSPAR ) 5 MG tablet Take 1 tablet (5 mg total) by mouth 2 (two) times daily.   Cholecalciferol (VITAMIN D3) 2000 units capsule Take 2,000 Units by mouth at bedtime.    Continuous Glucose Receiver (FREESTYLE LIBRE 3 READER) DEVI 1 kit by Does not apply route daily.   Continuous Glucose Sensor (FREESTYLE LIBRE 3 SENSOR) MISC USE 1 SENSOR ON THE SKIN EVERY 14 DAYS AS DIRECTED   empagliflozin  (JARDIANCE ) 25 MG TABS tablet Take 1 tablet (25 mg total) by mouth daily.   escitalopram  (LEXAPRO ) 20 MG tablet Take 1 tablet (20 mg total) by mouth daily.   fexofenadine (ALLEGRA) 180 MG tablet Take 180 mg by mouth daily with breakfast.    fluticasone (FLONASE) 50 MCG/ACT nasal spray Place 1-2 sprays into both nostrils every evening.    gabapentin  (NEURONTIN ) 100 MG capsule Take 2 capsules (200 mg total) by mouth 2 (two) times daily.   Glucagon  (GVOKE HYPOPEN  2-PACK) 1 MG/0.2ML SOAJ Inject 1 each into the skin as needed.   Hyoscyamine  Sulfate SL 0.125 MG SUBL Place 0.125 mg under the tongue daily as needed (cramping).   insulin  glargine, 1 Unit Dial , (TOUJEO  SOLOSTAR) 300 UNIT/ML Solostar Pen INJECT 50 UNITS SUBCUTANEOUSLY AT BEDTIME AND 30 UNITS AT LUNCH TIME   insulin  lispro (HUMALOG  KWIKPEN) 100 UNIT/ML KwikPen INJECT 10-20 UNITS SUBCUTANEOUSLY WITH BREAKFAST  WITH LUNCH AND DINNER   Insulin  Syringe-Needle U-100 30G X 5/16 0.3 ML MISC Inject 1 each into the skin 3 (three) times daily.   leflunomide (ARAVA) 10 MG tablet Take 1 tablet by mouth daily.   lisinopril  (ZESTRIL ) 30 MG tablet TAKE 1 TABLET BY MOUTH  ONCE DAILY   Magnesium 250 MG TABS Take 250 mg by mouth daily.   metFORMIN  (GLUCOPHAGE ) 1000 MG tablet Take 1 tablet (1,000 mg total) by mouth 2 (two) times daily with a meal.   nystatin  (MYCOSTATIN /NYSTOP ) powder Apply 1 Application topically 3 (three) times daily. (Patient taking differently: Apply 1 Application topically 3 (three) times daily. PRN)   nystatin  cream (MYCOSTATIN ) Apply 1 Application topically 2 (two) times daily. (Patient taking differently: Apply 1 Application topically 2 (two) times daily. PRN)   omeprazole  (PRILOSEC) 40 MG capsule Take 1 capsule (40 mg total) by mouth daily.   ondansetron  (ZOFRAN ) 4 MG tablet Take 1 tablet (4 mg total) by mouth every 8 (eight) hours as needed for nausea or vomiting.   simvastatin  (ZOCOR ) 40 MG tablet Take 1 tablet (40 mg total) by mouth at bedtime.   tiZANidine (ZANAFLEX) 2 MG tablet Take 2 mg by mouth at bedtime.    traMADol  (ULTRAM ) 50 MG tablet Take 100 mg by mouth 2 (two) times daily.   ULTRACARE PEN NEEDLES 33G X 4 MM MISC USE AS DIRECTED. TWICE DAILY   zinc gluconate 50 MG tablet Take 50 mg by mouth daily.   Coenzyme Q10 (COQ-10) 100 MG CAPS Take 100 mg by mouth daily. (Patient not taking: Reported on 12/06/2024)   Facility-Administered Encounter Medications as of 12/06/2024  Medication   triamcinolone  acetonide (KENALOG -40) injection 40 mg    History: Past Medical History:  Diagnosis Date  Abnormal mammogram    Allergy    Anxiety    Arthritis    Back pain    spinal stenosis and neck bone spur   Cancer (HCC)    Breast   Car sickness    Collagenous colitis    Depression    Diabetes mellitus without complication (HCC)    GERD (gastroesophageal reflux disease)    occ-no meds   Hair loss    TAKING THYROTAIN   Headache    migraines   Hyperlipemia    Hypertension    Osteoarthritis    seen by Rheum, not enoug evidence to support RA   Pinched nerve    PONV (postoperative nausea and vomiting)    after tonsillectomy    Rosacea    Seborrheic dermatitis    Sleep apnea    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 Tumor is triple negative. She is dated radiation in August 2018. Continues to have problems with lymphedema   Wears glasses    Past Surgical History:  Procedure Laterality Date   ACHILLES TENDON REPAIR  2007   right   BREAST LUMPECTOMY  2018   BREAST LUMPECTOMY Left 03/2017   BREAST SURGERY  2011   lt br bx-non cancer   CARPAL TUNNEL RELEASE  1989   right and left   CHOLECYSTECTOMY N/A 09/06/2017   Gallbladder not removed due to complications. Procedure: LAPAROSCOPY;  Surgeon: Claudene Larinda Bolder, MD;  Location: [facility] ORS;  Service: General;  Laterality: N/A;   COLONOSCOPY     COLONOSCOPY N/A 08/02/2024   Procedure: COLONOSCOPY;  Surgeon: Onita Elspeth Sharper, DO;  Location[facility];  Service: Gastroenterology;  Laterality: N/A;  IDDM   COLONOSCOPY WITH PROPOFOL  N/A 09/24/2021   Procedure: COLONOSCOPY WITH PROPOFOL ;  Surgeon: Onita Elspeth Sharper, DO;  Location: [facility] ENDOSCOPY;  Service: Gastroenterology;  Laterality: N/A;  IDDM   epidural steroid injection x2  2017   ESOPHAGEAL DILATION  08/02/2024   Procedure: DILATION, ESOPHAGUS;  Surgeon: Onita Elspeth Sharper, DO;  Location: [facility] ENDOSCOPY;  Service: Gastroenterology;;   ESOPHAGOGASTRODUODENOSCOPY N/A 01/21/2016   Procedure: ESOPHAGOGASTRODUODENOSCOPY (EGD);  Surgeon: Deward CINDERELLA Piedmont, MD;  Location: [facility];  Service: Gastroenterology;  Laterality: N/A;  Please call at work [phone] Diabetic - insulin    ESOPHAGOGASTRODUODENOSCOPY N/A 09/24/2021   Procedure: ESOPHAGOGASTRODUODENOSCOPY (EGD);  Surgeon: Onita Elspeth Sharper, DO;  Location: [facility] ENDOSCOPY;  Service: Gastroenterology;  Laterality: N/A;   ESOPHAGOGASTRODUODENOSCOPY N/A 08/02/2024   Procedure: EGD (ESOPHAGOGASTRODUODENOSCOPY);  Surgeon: Onita Elspeth Sharper, DO;  Location: [facility] ENDOSCOPY;  Service: Gastroenterology;   Laterality: N/A;   FINGER EXPLORATION  2013   left long    Lynch's Syndrome     MASTECTOMY Left 2018   Partial   NERVE EXPLORATION Left 10/03/2013   Procedure: NERVE EXPLORATION/ LEFT LONG RADIAL DISTAL NERVE neurolysis;  Surgeon: Donnice DELENA Robinsons, MD;  Location: [facility];  Service: Orthopedics;  Laterality: Left;   POLYPECTOMY  08/02/2024   Procedure: POLYPECTOMY, INTESTINE;  Surgeon: Onita Elspeth Sharper, DO;  Location: [facility] ENDOSCOPY;  Service: Gastroenterology;;   SHOULDER ARTHROSCOPY WITH DEBRIDEMENT AND BICEP TENDON REPAIR Right 05/13/2016   Procedure: Arthroscopic debridement, open decompression, rotator cuff repair, tenodesis,right shoulder.;  Surgeon: Norleen JINNY Maltos, MD;  Location: [facility];  Service: Orthopedics;  Laterality: Right;   TONSILLECTOMY  2000   UVULOPALATOPHARYNGOPLASTY  1998   and tonsils-tongue pexi    BREAST BIOPSY Left 03/14/2017  Family History  Problem Relation Age of Onset   Liver cancer Mother 26       gallbaldder   Hypertension Mother    Hyperlipidemia Mother    Arthritis Mother    Hyperlipidemia Father    Hypertension Father    Varicose Veins Father    Diabetes Maternal Aunt    Diabetes Maternal Uncle    Heart disease Maternal Uncle    Breast cancer Paternal Aunt    Diabetes Paternal Aunt    Breast cancer Paternal Aunt    Diabetes Paternal Uncle    Dementia Maternal Grandmother    Stroke Maternal Grandfather    Diabetes Paternal Grandmother    Heart disease Paternal Grandfather    Other Cousin 71       BRCA or similar mutation positive   Social History   Occupational History   Occupation: retired  Tobacco Use   Smoking status: Former    Current packs/day: 0.00    Average packs/day: 1 pack/day for 10.0 years (10.0 ttl pk-yrs)    Types: Cigarettes    Start date: 09/28/1976    Quit date: 09/28/1986    Years since quitting: 38.2   Smokeless tobacco: Never  Vaping Use   Vaping status: Never Used  Substance and  Sexual Activity   Alcohol use: Not Currently   Drug use: Not Currently   Sexual activity: Not Currently   Tobacco Counseling Counseling given: Not Answered  SDOH Screenings   Food Insecurity: No Food Insecurity (12/06/2024)  Housing: Low Risk (12/06/2024)  Transportation Needs: No Transportation Needs (12/06/2024)  Utilities: Not At Risk (12/06/2024)  Alcohol Screen: Low Risk (09/01/2022)  Depression (PHQ2-9): Low Risk (12/06/2024)  Financial Resource Strain: Low Risk (09/17/2024)  Physical Activity: Inactive (12/06/2024)  Social Connections: Moderately Isolated (12/06/2024)  Stress: No Stress Concern Present (12/06/2024)  Tobacco Use: Medium Risk (12/06/2024)  Health Literacy: Adequate Health Literacy (12/06/2024)   See flowsheets for full screening details  Depression Screen PHQ 2 & 9 Depression Scale- Over the past 2 weeks, how often have you been bothered by any of the following problems? Little interest or pleasure in doing things: 0 Feeling down, depressed, or hopeless (PHQ Adolescent also includes...irritable): 0 PHQ-2 Total Score: 0 Trouble falling or staying asleep, or sleeping too much: 0 Feeling tired or having little energy: 0 Poor appetite or overeating (PHQ Adolescent also includes...weight loss): 0 Feeling bad about yourself - or that you are a failure or have let yourself or your family down: 0 Trouble concentrating on things, such as reading the newspaper or watching television (PHQ Adolescent also includes...like school work): 0 Moving or speaking so slowly that other people could have noticed. Or the opposite - being so fidgety or restless that you have been moving around a lot more than usual: 0 Thoughts that you would be better off dead, or of hurting yourself in some way: 0 PHQ-9 Total Score: 0 If you checked off any problems, how difficult have these problems made it for you to do your work, take care of things at home, or get along with other people?: Not difficult at  all  Depression Treatment Depression Interventions/Treatment : EYV7-0 Score <4 Follow-up Not Indicated; Currently on Treatment     Goals Addressed               This Visit's Progress     Weight (lb) < 150 lb (68 kg) (pt-stated)   193 lb (87.5 kg)  Objective:    Today's Vitals   12/06/24 1350  Weight: 193 lb (87.5 kg)  Height: 5' 1 (1.549 m)   Body mass index is 36.47 kg/m.  Hearing/Vision screen Hearing Screening - Comments:: Denies hearing loss  Vision Screening - Comments:: UTD with Dr. Dingeldein @ [facility] Immunizations and Health Maintenance Health Maintenance  Topic Date Due   Zoster Vaccines- Shingrix (1 of 2) Never done   Mammogram  10/19/2024   FOOT EXAM  11/07/2024   HEMOGLOBIN A1C  03/19/2025   Diabetic kidney evaluation - Urine ACR  04/20/2025   OPHTHALMOLOGY EXAM  06/14/2025   Diabetic kidney evaluation - eGFR measurement  07/17/2025   DTaP/Tdap/Td (2 - Td or Tdap) 12/03/2025   Medicare Annual Wellness (AWV)  12/06/2025   Colonoscopy  08/21/2026   Bone Density Scan  03/01/2029   Pneumococcal Vaccine: 50+ Years  Completed   Influenza Vaccine  Completed   Hepatitis C Screening  Completed   Meningococcal B Vaccine  Aged Out   COVID-19 Vaccine  Discontinued        Assessment/Plan:  This is a routine wellness examination for Sierra Copeland.  Patient Care Team: Vicci Duwaine SQUIBB, DO as PCP - General (Family Medicine) Jacobo Evalene PARAS, MD as Consulting Physician (Oncology) Onita Elspeth Sharper, DO as Consulting Physician (Gastroenterology) Deanna Channing LABOR, [facility] as Pharmacist (Pharmacist) Avanell Katz, MD as Referring Physician (Physical Medicine and Rehabilitation) Dingeldein, Elspeth, MD (Ophthalmology) Drake Comings E (Orthopedic Surgery)  I have personally reviewed and noted the following in the patients chart:   Medical and social history Use of alcohol, tobacco or illicit drugs  Current medications and  supplements including opioid prescriptions. Functional ability and status Nutritional status Physical activity Advanced directives List of other physicians Hospitalizations, surgeries, and ER visits in previous 12 months Vitals Screenings to include cognitive, depression, and falls Referrals and appointments  No orders of the defined types were placed in this encounter.  In addition, I have reviewed and discussed with patient certain preventive protocols, quality metrics, and best practice recommendations. A written personalized care plan for preventive services as well as general preventive health recommendations were provided to patient.   Vina Ned, CMA   12/06/2024   Return in 53 weeks (on 12/12/2025) for Medicare Annual Wellness Visit.  After Visit Summary: (MyChart) Due to this being a telephonic visit, the after visit summary with patients personalized plan was offered to patient via MyChart   Nurse Notes:  FBS this morning was 120 per patient Has MMG scheduled 12/26/24 Needs DM foot exam at next OV on 12/07/24 Undecided about shingles vaccine at this time Declined Covid vaccine Declined DM & Nutrition education referral "

## 2024-12-07 ENCOUNTER — Ambulatory Visit: Admitting: Family Medicine

## 2024-12-10 ENCOUNTER — Encounter: Payer: Self-pay | Admitting: Family Medicine

## 2024-12-10 ENCOUNTER — Ambulatory Visit: Admitting: Family Medicine

## 2024-12-10 VITALS — BP 127/85 | HR 97 | Ht 61.0 in | Wt 198.2 lb

## 2024-12-10 DIAGNOSIS — M059 Rheumatoid arthritis with rheumatoid factor, unspecified: Secondary | ICD-10-CM

## 2024-12-10 DIAGNOSIS — I129 Hypertensive chronic kidney disease with stage 1 through stage 4 chronic kidney disease, or unspecified chronic kidney disease: Secondary | ICD-10-CM

## 2024-12-10 DIAGNOSIS — R300 Dysuria: Secondary | ICD-10-CM | POA: Diagnosis not present

## 2024-12-10 DIAGNOSIS — E1129 Type 2 diabetes mellitus with other diabetic kidney complication: Secondary | ICD-10-CM

## 2024-12-10 DIAGNOSIS — R809 Proteinuria, unspecified: Secondary | ICD-10-CM

## 2024-12-10 DIAGNOSIS — Z794 Long term (current) use of insulin: Secondary | ICD-10-CM | POA: Diagnosis not present

## 2024-12-10 DIAGNOSIS — E7800 Pure hypercholesterolemia, unspecified: Secondary | ICD-10-CM

## 2024-12-10 DIAGNOSIS — C50912 Malignant neoplasm of unspecified site of left female breast: Secondary | ICD-10-CM

## 2024-12-10 DIAGNOSIS — N898 Other specified noninflammatory disorders of vagina: Secondary | ICD-10-CM

## 2024-12-10 DIAGNOSIS — F3342 Major depressive disorder, recurrent, in full remission: Secondary | ICD-10-CM

## 2024-12-10 LAB — MICROSCOPIC EXAMINATION: Bacteria, UA: NONE SEEN

## 2024-12-10 LAB — URINALYSIS, ROUTINE W REFLEX MICROSCOPIC
Leukocytes,UA: NEGATIVE
Nitrite, UA: NEGATIVE
RBC, UA: NEGATIVE
Specific Gravity, UA: 1.03 (ref 1.005–1.030)
Urobilinogen, Ur: 0.2 mg/dL (ref 0.2–1.0)
pH, UA: 5.5 (ref 5.0–7.5)

## 2024-12-10 LAB — WET PREP FOR TRICH, YEAST, CLUE
Clue Cell Exam: NEGATIVE
Trichomonas Exam: NEGATIVE
Yeast Exam: NEGATIVE

## 2024-12-10 LAB — MICROALBUMIN, URINE WAIVED
Creatinine, Urine Waived: 300 mg/dL (ref 10–300)
Microalb, Ur Waived: 150 mg/L — ABNORMAL HIGH (ref 0–19)

## 2024-12-10 LAB — BAYER DCA HB A1C WAIVED: HB A1C (BAYER DCA - WAIVED): 8.5 % — ABNORMAL HIGH (ref 4.8–5.6)

## 2024-12-10 MED ORDER — OMEPRAZOLE 40 MG PO CPDR: 40.0000 mg | DELAYED_RELEASE_CAPSULE | Freq: Every day | ORAL | 1 refills | Status: AC

## 2024-12-10 MED ORDER — BUSPIRONE HCL 5 MG PO TABS: 5.0000 mg | ORAL_TABLET | Freq: Two times a day (BID) | ORAL | 1 refills | Status: AC

## 2024-12-10 MED ORDER — GABAPENTIN 100 MG PO CAPS: 200.0000 mg | ORAL_CAPSULE | Freq: Two times a day (BID) | ORAL | 1 refills | Status: AC

## 2024-12-10 MED ORDER — INSULIN LISPRO (1 UNIT DIAL) 100 UNIT/ML (KWIKPEN): PEN_INJECTOR | SUBCUTANEOUS | 1 refills | Status: AC

## 2024-12-10 MED ORDER — METFORMIN HCL 1000 MG PO TABS: 1000.0000 mg | ORAL_TABLET | Freq: Two times a day (BID) | ORAL | 1 refills | Status: AC

## 2024-12-10 MED ORDER — EMPAGLIFLOZIN 25 MG PO TABS: 25.0000 mg | ORAL_TABLET | Freq: Every day | ORAL | 1 refills | Status: AC

## 2024-12-10 MED ORDER — TOUJEO SOLOSTAR 300 UNIT/ML ~~LOC~~ SOPN: PEN_INJECTOR | SUBCUTANEOUS | 1 refills | Status: AC

## 2024-12-10 MED ORDER — SIMVASTATIN 40 MG PO TABS: 40.0000 mg | ORAL_TABLET | Freq: Every day | ORAL | 1 refills | Status: AC

## 2024-12-10 MED ORDER — ESCITALOPRAM OXALATE 20 MG PO TABS: 20.0000 mg | ORAL_TABLET | Freq: Every day | ORAL | 1 refills | Status: AC

## 2024-12-10 MED ORDER — LISINOPRIL 30 MG PO TABS: 30.0000 mg | ORAL_TABLET | Freq: Every day | ORAL | 1 refills | Status: AC

## 2024-12-10 NOTE — Assessment & Plan Note (Signed)
 Under good control on current regimen. Continue current regimen. Continue to monitor. Call with any concerns. Refills given. Labs drawn today.

## 2024-12-10 NOTE — Assessment & Plan Note (Signed)
Continue to follow with rheumatology. Call with any concerns.  

## 2024-12-10 NOTE — Assessment & Plan Note (Signed)
 Under good control on current regimen. Continue current regimen. Continue to monitor. Call with any concerns. Refills given.

## 2024-12-10 NOTE — Assessment & Plan Note (Addendum)
 Not doing well with A1c of 8.5 up from 7.7. Has not been doing well with her diet. Will really work on diet and recheck in 3 months. Call with any concerns.

## 2024-12-10 NOTE — Progress Notes (Addendum)
 "  BP 127/85   Pulse 97   Ht 5' 1 (1.549 m)   Wt 198 lb 3.2 oz (89.9 kg)   LMP 03/17/2013   SpO2 99%   BMI 37.45 kg/m    Subjective:    Patient ID: Sierra Copeland, female    DOB: May 21, 1956, 69 y.o.   MRN: [id number]  HPI: [name] is a 69 y.o. female  Chief Complaint  Patient presents with   Diabetes   Vaginitis   Dysuria    Onset about a month ago    DIABETES Hypoglycemic episodes:no Polydipsia/polyuria: no Visual disturbance: no Chest pain: no Paresthesias: no Glucose Monitoring: yes  Accucheck frequency: continuous  Fasting glucose: 150s Taking Insulin ?: yes  Long acting insulin : toujeo  50 units at bedtime and 30 units at lunch time  Short acting insulin : humalog  sliding scale Blood Pressure Monitoring: not checking Retinal Examination: Up to Date Foot Exam: Up to Date Diabetic Education: Completed Pneumovax: Up to Date Influenza: Up to Date Aspirin: yes  HYPERTENSION / HYPERLIPIDEMIA Satisfied with current treatment? yes Duration of hypertension: chronic BP monitoring frequency: not checking BP medication side effects: no Past BP meds: lisinopril  Duration of hyperlipidemia: chronic Cholesterol medication side effects: no Cholesterol supplements: none Past cholesterol medications: simvastatin  Medication compliance: excellent compliance Aspirin: yes Recent stressors: no Recurrent headaches: no Visual changes: no Palpitations: no Dyspnea: no Chest pain: no Lower extremity edema: no Dizzy/lightheaded: yes  DEPRESSION Mood status: controlled Satisfied with current treatment?: yes Symptom severity: mild  Duration of current treatment : chronic Side effects: no Medication compliance: excellent compliance Psychotherapy/counseling: no  Previous psychiatric medications: lexapro  Depressed mood: no Anxious mood: no Anhedonia: no Significant weight loss or gain: no Insomnia: no  Fatigue: no Feelings of worthlessness or guilt: no Impaired  concentration/indecisiveness: no Suicidal ideations: no Hopelessness: no Crying spells: no    12/06/2024    2:03 PM 07/17/2024   11:01 AM 04/20/2024    8:17 AM 01/20/2024   10:25 AM 11/08/2023    8:24 AM  Depression screen PHQ 2/9  Decreased Interest 0 0 0 0 0  Down, Depressed, Hopeless 0 0 0 0 0  PHQ - 2 Score 0 0 0 0 0  Altered sleeping 0 0 0 0 0  Tired, decreased energy 0 0 0 0 0  Change in appetite 0 0 0 0 0  Feeling bad or failure about yourself  0 0 0 0 0  Trouble concentrating 0 0 0 0 0  Moving slowly or fidgety/restless 0 0 0 0 0  Suicidal thoughts 0 0 0 0 0  PHQ-9 Score 0 0  0  0  0   Difficult doing work/chores Not difficult at all  Not difficult at all Not difficult at all Not difficult at all     Data saved with a previous flowsheet row definition    URINARY SYMPTOMS Duration: about a month Dysuria: yes Urinary frequency: yes Urgency: no Small volume voids: no Symptom severity: no Urinary incontinence: no Foul odor: no Hematuria: no Abdominal pain: no Back pain: no Suprapubic pain/pressure: no Flank pain: no Fever:  no Vomiting: no Relief with cranberry juice: no Relief with pyridium: no Status: stable Previous urinary tract infection: yes Recurrent urinary tract infection: no Sexual activity: No sexually active History of sexually transmitted disease: no Vaginal discharge: no Treatments attempted: none   Relevant past medical, surgical, family and social history reviewed and updated as indicated. Interim medical history since our last visit reviewed. Allergies  and medications reviewed and updated.  Review of Systems  Constitutional: Negative.   HENT: Negative.    Respiratory: Negative.    Cardiovascular: Negative.   Gastrointestinal: Negative.   Genitourinary:  Positive for dysuria and urgency. Negative for decreased urine volume, difficulty urinating, dyspareunia, enuresis, flank pain, frequency, genital sores, hematuria, menstrual problem,  pelvic pain, vaginal bleeding, vaginal discharge and vaginal pain.  Musculoskeletal: Negative.   Psychiatric/Behavioral: Negative.      Per HPI unless specifically indicated above     Objective:    BP 127/85   Pulse 97   Ht 5' 1 (1.549 m)   Wt 198 lb 3.2 oz (89.9 kg)   LMP 03/17/2013   SpO2 99%   BMI 37.45 kg/m   Wt Readings from Last 3 Encounters:  12/10/24 198 lb 3.2 oz (89.9 kg)  12/06/24 193 lb (87.5 kg)  09/26/24 203 lb (92.1 kg)    Physical Exam Vitals and nursing note reviewed.  Constitutional:      General: She is not in acute distress.    Appearance: Normal appearance. She is not ill-appearing, toxic-appearing or diaphoretic.  HENT:     Head: Normocephalic and atraumatic.     Right Ear: External ear normal.     Left Ear: External ear normal.     Nose: Nose normal.     Mouth/Throat:     Mouth: Mucous membranes are moist.     Pharynx: Oropharynx is clear.  Eyes:     General: No scleral icterus.       Right eye: No discharge.        Left eye: No discharge.     Extraocular Movements: Extraocular movements intact.     Conjunctiva/sclera: Conjunctivae normal.     Pupils: Pupils are equal, round, and reactive to light.  Cardiovascular:     Rate and Rhythm: Normal rate and regular rhythm.     Pulses: Normal pulses.     Heart sounds: Normal heart sounds. No murmur heard.    No friction rub. No gallop.  Pulmonary:     Effort: Pulmonary effort is normal. No respiratory distress.     Breath sounds: Normal breath sounds. No stridor. No wheezing, rhonchi or rales.  Chest:     Chest wall: No tenderness.  Musculoskeletal:        General: Normal range of motion.     Cervical back: Normal range of motion and neck supple.  Skin:    General: Skin is warm and dry.     Capillary Refill: Capillary refill takes less than 2 seconds.     Coloration: Skin is not jaundiced or pale.     Findings: No bruising, erythema, lesion or rash.  Neurological:     General: No focal  deficit present.     Mental Status: She is alert and oriented to person, place, and time. Mental status is at baseline.  Psychiatric:        Mood and Affect: Mood normal.        Behavior: Behavior normal.        Thought Content: Thought content normal.        Judgment: Judgment normal.     Results for orders placed or performed in visit on 09/18/24  Bayer DCA Hb A1c Waived   Collection Time: 09/18/24  1:32 PM  Result Value Ref Range   HB A1C (BAYER DCA - WAIVED) 7.7 (H) 4.8 - 5.6 %      Assessment & Plan:   Problem  List Items Addressed This Visit       Endocrine   Type 2 diabetes mellitus with microalbuminuria, with long-term current use of insulin  (HCC)   Not doing well with A1c of 8.5 up from 7.7. Has not been doing well with her diet. Will really work on diet and recheck in 3 months. Call with any concerns.       Relevant Medications   empagliflozin  (JARDIANCE ) 25 MG TABS tablet   insulin  glargine, 1 Unit Dial , (TOUJEO  SOLOSTAR) 300 UNIT/ML Solostar Pen   insulin  lispro (HUMALOG  KWIKPEN) 100 UNIT/ML KwikPen   lisinopril  (ZESTRIL ) 30 MG tablet   metFORMIN  (GLUCOPHAGE ) 1000 MG tablet   simvastatin  (ZOCOR ) 40 MG tablet   Other Relevant Orders   CBC with Differential/Platelet   Comprehensive metabolic panel with GFR   Bayer DCA Hb A1c Waived   Microalbumin, Urine Waived     Musculoskeletal and Integument   Rheumatoid arthritis (HCC)   Continue to follow with rheumatology. Call with any concerns.         Genitourinary   Benign hypertensive renal disease   Under good control on current regimen. Continue current regimen. Continue to monitor. Call with any concerns. Refills given. Labs drawn today.        Relevant Orders   CBC with Differential/Platelet   Comprehensive metabolic panel with GFR   TSH     Other   Hypercholesteremia   Under good control on current regimen. Continue current regimen. Continue to monitor. Call with any concerns. Refills given. Labs  drawn today.       Relevant Medications   lisinopril  (ZESTRIL ) 30 MG tablet   simvastatin  (ZOCOR ) 40 MG tablet   Other Relevant Orders   CBC with Differential/Platelet   Comprehensive metabolic panel with GFR   Lipid Panel w/o Chol/HDL Ratio   Morbid obesity (HCC)   Encouraged diet and exercise with goal of losing 1-2lbs per week. Call with any concerns.       Relevant Medications   empagliflozin  (JARDIANCE ) 25 MG TABS tablet   insulin  glargine, 1 Unit Dial , (TOUJEO  SOLOSTAR) 300 UNIT/ML Solostar Pen   insulin  lispro (HUMALOG  KWIKPEN) 100 UNIT/ML KwikPen   metFORMIN  (GLUCOPHAGE ) 1000 MG tablet   Invasive ductal carcinoma of breast, female, left (HCC)   Continue to follow with oncology. On letrazole. Call with any concerns.       Recurrent major depressive disorder, in full remission   Under good control on current regimen. Continue current regimen. Continue to monitor. Call with any concerns. Refills given.        Relevant Medications   busPIRone  (BUSPAR ) 5 MG tablet   escitalopram  (LEXAPRO ) 20 MG tablet   Other Visit Diagnoses       Dysuria    -  Primary   UA shows glucosuria. Will check culture and work on getting sugars down.   Relevant Orders   Urinalysis, Routine w reflex microscopic   Urine Culture     Vaginal itching       Wet prep normal.   Relevant Orders   WET PREP FOR TRICH, YEAST, CLUE        Follow up plan: Return in about 3 months (around 03/10/2025).      "

## 2024-12-10 NOTE — Assessment & Plan Note (Signed)
 Encouraged diet and exercise with goal of losing 1-2lbs per week. Call with any concerns.

## 2024-12-10 NOTE — Assessment & Plan Note (Signed)
 Continue to follow with oncology. On letrazole. Call with any concerns.

## 2024-12-11 ENCOUNTER — Telehealth: Payer: Self-pay

## 2024-12-11 ENCOUNTER — Ambulatory Visit: Payer: Self-pay | Admitting: Family Medicine

## 2024-12-11 DIAGNOSIS — Z15060 Genetic susceptibility to colorectal cancer: Secondary | ICD-10-CM

## 2024-12-11 DIAGNOSIS — N289 Disorder of kidney and ureter, unspecified: Secondary | ICD-10-CM

## 2024-12-11 LAB — COMPREHENSIVE METABOLIC PANEL WITH GFR
ALT: 14 IU/L (ref 0–32)
AST: 15 IU/L (ref 0–40)
Albumin: 4.1 g/dL (ref 3.9–4.9)
Alkaline Phosphatase: 43 IU/L — ABNORMAL LOW (ref 49–135)
BUN/Creatinine Ratio: 18 (ref 12–28)
BUN: 31 mg/dL — ABNORMAL HIGH (ref 8–27)
Bilirubin Total: 0.3 mg/dL (ref 0.0–1.2)
CO2: 16 mmol/L — ABNORMAL LOW (ref 20–29)
Calcium: 9.8 mg/dL (ref 8.7–10.3)
Chloride: 103 mmol/L (ref 96–106)
Creatinine, Ser: 1.68 mg/dL — ABNORMAL HIGH (ref 0.57–1.00)
Globulin, Total: 2.4 g/dL (ref 1.5–4.5)
Glucose: 236 mg/dL — ABNORMAL HIGH (ref 70–99)
Potassium: 4.8 mmol/L (ref 3.5–5.2)
Sodium: 141 mmol/L (ref 134–144)
Total Protein: 6.5 g/dL (ref 6.0–8.5)
eGFR: 33 mL/min/1.73 — ABNORMAL LOW

## 2024-12-11 LAB — CBC WITH DIFFERENTIAL/PLATELET
Basophils Absolute: 0.1 x10E3/uL (ref 0.0–0.2)
Basos: 1 %
EOS (ABSOLUTE): 0.2 x10E3/uL (ref 0.0–0.4)
Eos: 3 %
Hematocrit: 42 % (ref 34.0–46.6)
Hemoglobin: 13.2 g/dL (ref 11.1–15.9)
Immature Grans (Abs): 0 x10E3/uL (ref 0.0–0.1)
Immature Granulocytes: 0 %
Lymphocytes Absolute: 2.7 x10E3/uL (ref 0.7–3.1)
Lymphs: 39 %
MCH: 26.9 pg (ref 26.6–33.0)
MCHC: 31.4 g/dL — ABNORMAL LOW (ref 31.5–35.7)
MCV: 86 fL (ref 79–97)
Monocytes Absolute: 0.6 x10E3/uL (ref 0.1–0.9)
Monocytes: 9 %
Neutrophils Absolute: 3.3 x10E3/uL (ref 1.4–7.0)
Neutrophils: 48 %
Platelets: 317 x10E3/uL (ref 150–450)
RBC: 4.9 x10E6/uL (ref 3.77–5.28)
RDW: 15.6 % — ABNORMAL HIGH (ref 11.7–15.4)
WBC: 6.8 x10E3/uL (ref 3.4–10.8)

## 2024-12-11 LAB — LIPID PANEL W/O CHOL/HDL RATIO
Cholesterol, Total: 142 mg/dL (ref 100–199)
HDL: 65 mg/dL
LDL Chol Calc (NIH): 54 mg/dL (ref 0–99)
Triglycerides: 135 mg/dL (ref 0–149)
VLDL Cholesterol Cal: 23 mg/dL (ref 5–40)

## 2024-12-11 LAB — TSH: TSH: 1.54 u[IU]/mL (ref 0.450–4.500)

## 2024-12-11 NOTE — Telephone Encounter (Signed)
 Copied from CRM [id number]. Topic: Referral - Request for Referral >> Dec 11, 2024  3:21 PM Roselie BROCKS wrote: Did the patient discuss referral with their provider in the last year? Yes (If No - schedule appointment) (If Yes - send message)  Appointment offered? 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 No (If Yes, this issue or another issue? When? Where?  Can we respond through MyChart? Yes

## 2024-12-11 NOTE — Telephone Encounter (Unsigned)
 Copied from CRM [id number]. Topic: Appointments - Scheduling Inquiry for Clinic >> Dec 11, 2024  3:26 PM Roselie BROCKS wrote: Reason for CRM: Patient states she was to schedule an appointment with Dr Vicci 3-4  weeks from now for follow up on kidney function, no availability until 01-25-25. Patient request a return call concerning this.

## 2024-12-11 NOTE — Telephone Encounter (Signed)
 Can this referral be entered for the patient?

## 2024-12-13 LAB — URINE CULTURE

## 2024-12-13 NOTE — Telephone Encounter (Signed)
 Will follow up with patient and note it on the other encounter.

## 2024-12-13 NOTE — Telephone Encounter (Signed)
 Reviewed patient's chart. Patient is supposed to follow up in 3 to 4 weeks for a lab only visit. Please reach out to patient and schedule lab visit.

## 2024-12-14 NOTE — Progress Notes (Signed)
 Scheduled

## 2024-12-26 ENCOUNTER — Ambulatory Visit
Admission: RE | Admit: 2024-12-26 | Discharge: 2024-12-26 | Disposition: A | Discharge status: Home / Self Care | Source: Ambulatory Visit | Attending: Oncology | Admitting: Oncology

## 2024-12-26 DIAGNOSIS — Z1231 Encounter for screening mammogram for malignant neoplasm of breast: Secondary | ICD-10-CM | POA: Insufficient documentation

## 2024-12-26 DIAGNOSIS — C50912 Malignant neoplasm of unspecified site of left female breast: Secondary | ICD-10-CM | POA: Insufficient documentation

## 2025-01-07 ENCOUNTER — Other Ambulatory Visit

## 2025-02-06 ENCOUNTER — Other Ambulatory Visit

## 2025-03-05 ENCOUNTER — Encounter: Admitting: Obstetrics

## 2025-03-12 ENCOUNTER — Ambulatory Visit: Admitting: Family Medicine

## 2025-05-07 ENCOUNTER — Ambulatory Visit: Admitting: Oncology

## 2025-12-12 ENCOUNTER — Ambulatory Visit
